# Patient Record
Sex: Male | Born: 1945 | Race: White | Hispanic: No | Marital: Married | Smoking: Former smoker
Health system: Southern US, Community
[De-identification: ages and names within clinical notes are randomized; demographics above are authoritative.]

## PROBLEM LIST (undated history)

## (undated) DIAGNOSIS — K222 Esophageal obstruction: Secondary | ICD-10-CM

## (undated) DIAGNOSIS — I499 Cardiac arrhythmia, unspecified: Secondary | ICD-10-CM

## (undated) DIAGNOSIS — R011 Cardiac murmur, unspecified: Secondary | ICD-10-CM

## (undated) DIAGNOSIS — M159 Polyosteoarthritis, unspecified: Secondary | ICD-10-CM

## (undated) DIAGNOSIS — K760 Fatty (change of) liver, not elsewhere classified: Secondary | ICD-10-CM

## (undated) DIAGNOSIS — J4540 Moderate persistent asthma, uncomplicated: Secondary | ICD-10-CM

## (undated) DIAGNOSIS — T7840XA Allergy, unspecified, initial encounter: Secondary | ICD-10-CM

## (undated) DIAGNOSIS — E785 Hyperlipidemia, unspecified: Secondary | ICD-10-CM

## (undated) DIAGNOSIS — Z1211 Encounter for screening for malignant neoplasm of colon: Secondary | ICD-10-CM

## (undated) DIAGNOSIS — I872 Venous insufficiency (chronic) (peripheral): Secondary | ICD-10-CM

## (undated) DIAGNOSIS — J189 Pneumonia, unspecified organism: Secondary | ICD-10-CM

## (undated) DIAGNOSIS — R0609 Other forms of dyspnea: Secondary | ICD-10-CM

## (undated) DIAGNOSIS — N183 Chronic kidney disease, stage 3 unspecified: Secondary | ICD-10-CM

## (undated) DIAGNOSIS — I2510 Atherosclerotic heart disease of native coronary artery without angina pectoris: Secondary | ICD-10-CM

## (undated) DIAGNOSIS — I10 Essential (primary) hypertension: Secondary | ICD-10-CM

## (undated) DIAGNOSIS — H269 Unspecified cataract: Secondary | ICD-10-CM

## (undated) DIAGNOSIS — Z87442 Personal history of urinary calculi: Secondary | ICD-10-CM

## (undated) DIAGNOSIS — I421 Obstructive hypertrophic cardiomyopathy: Secondary | ICD-10-CM

## (undated) DIAGNOSIS — I5032 Chronic diastolic (congestive) heart failure: Secondary | ICD-10-CM

## (undated) DIAGNOSIS — I480 Paroxysmal atrial fibrillation: Secondary | ICD-10-CM

## (undated) DIAGNOSIS — J449 Chronic obstructive pulmonary disease, unspecified: Secondary | ICD-10-CM

## (undated) DIAGNOSIS — K219 Gastro-esophageal reflux disease without esophagitis: Secondary | ICD-10-CM

## (undated) DIAGNOSIS — Z9989 Dependence on other enabling machines and devices: Secondary | ICD-10-CM

## (undated) DIAGNOSIS — K449 Diaphragmatic hernia without obstruction or gangrene: Secondary | ICD-10-CM

## (undated) DIAGNOSIS — R0600 Dyspnea, unspecified: Secondary | ICD-10-CM

## (undated) DIAGNOSIS — Z86010 Personal history of colonic polyps: Principal | ICD-10-CM

## (undated) DIAGNOSIS — R0989 Other specified symptoms and signs involving the circulatory and respiratory systems: Secondary | ICD-10-CM

## (undated) DIAGNOSIS — G4733 Obstructive sleep apnea (adult) (pediatric): Secondary | ICD-10-CM

## (undated) HISTORY — PX: OTHER SURGICAL HISTORY: SHX169

## (undated) HISTORY — DX: Cardiac murmur, unspecified: R01.1

## (undated) HISTORY — PX: TRANSTHORACIC ECHOCARDIOGRAM: SHX275

## (undated) HISTORY — DX: Esophageal obstruction: K22.2

## (undated) HISTORY — DX: Diaphragmatic hernia without obstruction or gangrene: K44.9

## (undated) HISTORY — PX: TRANSESOPHAGEAL ECHOCARDIOGRAM: SHX273

## (undated) HISTORY — DX: Obstructive hypertrophic cardiomyopathy: I42.1

## (undated) HISTORY — DX: Dependence on other enabling machines and devices: Z99.89

## (undated) HISTORY — DX: Obstructive sleep apnea (adult) (pediatric): G47.33

## (undated) HISTORY — DX: Unspecified cataract: H26.9

## (undated) HISTORY — DX: Paroxysmal atrial fibrillation: I48.0

## (undated) HISTORY — DX: Chronic diastolic (congestive) heart failure: I50.32

## (undated) HISTORY — DX: Allergy, unspecified, initial encounter: T78.40XA

## (undated) HISTORY — DX: Chronic obstructive pulmonary disease, unspecified: J44.9

## (undated) HISTORY — DX: Dyspnea, unspecified: R06.00

## (undated) HISTORY — DX: Other specified symptoms and signs involving the circulatory and respiratory systems: R09.89

## (undated) HISTORY — DX: Morbid (severe) obesity due to excess calories: E66.01

## (undated) HISTORY — DX: Chronic kidney disease, stage 3 unspecified: N18.30

## (undated) HISTORY — DX: Atherosclerotic heart disease of native coronary artery without angina pectoris: I25.10

## (undated) HISTORY — DX: Venous insufficiency (chronic) (peripheral): I87.2

## (undated) HISTORY — DX: Gastro-esophageal reflux disease without esophagitis: K21.9

## (undated) HISTORY — DX: Hyperlipidemia, unspecified: E78.5

## (undated) HISTORY — PX: NASAL SINUS SURGERY: SHX719

## (undated) HISTORY — DX: Encounter for screening for malignant neoplasm of colon: Z12.11

## (undated) HISTORY — PX: CARDIOVASCULAR STRESS TEST: SHX262

## (undated) HISTORY — DX: Other forms of dyspnea: R06.09

## (undated) HISTORY — DX: Fatty (change of) liver, not elsewhere classified: K76.0

## (undated) HISTORY — DX: Polyosteoarthritis, unspecified: M15.9

## (undated) HISTORY — DX: Essential (primary) hypertension: I10

## (undated) HISTORY — PX: COLONOSCOPY: SHX174

## (undated) HISTORY — PX: CARDIOVERSION: SHX1299

## (undated) HISTORY — DX: Personal history of colonic polyps: Z86.010

## (undated) HISTORY — DX: Moderate persistent asthma, uncomplicated: J45.40

---

## 1968-04-29 HISTORY — PX: SPINE SURGERY: SHX786

## 1999-01-25 ENCOUNTER — Ambulatory Visit (HOSPITAL_BASED_OUTPATIENT_CLINIC_OR_DEPARTMENT_OTHER): Admission: RE | Admit: 1999-01-25 | Discharge: 1999-01-25 | Payer: Self-pay | Admitting: Orthopaedic Surgery

## 2001-12-01 ENCOUNTER — Ambulatory Visit (HOSPITAL_BASED_OUTPATIENT_CLINIC_OR_DEPARTMENT_OTHER): Admission: RE | Admit: 2001-12-01 | Discharge: 2001-12-02 | Payer: Self-pay | Admitting: Orthopaedic Surgery

## 2003-04-21 ENCOUNTER — Encounter: Payer: Self-pay | Admitting: Family Medicine

## 2003-06-16 ENCOUNTER — Ambulatory Visit (HOSPITAL_COMMUNITY): Admission: RE | Admit: 2003-06-16 | Discharge: 2003-06-16 | Payer: Self-pay | Admitting: Internal Medicine

## 2003-06-17 ENCOUNTER — Ambulatory Visit (HOSPITAL_BASED_OUTPATIENT_CLINIC_OR_DEPARTMENT_OTHER): Admission: RE | Admit: 2003-06-17 | Discharge: 2003-06-17 | Payer: Self-pay | Admitting: Urology

## 2004-03-14 ENCOUNTER — Ambulatory Visit: Payer: Self-pay | Admitting: Internal Medicine

## 2004-04-20 ENCOUNTER — Ambulatory Visit: Payer: Self-pay | Admitting: Gastroenterology

## 2005-04-17 ENCOUNTER — Encounter: Payer: Self-pay | Admitting: Family Medicine

## 2005-04-17 ENCOUNTER — Emergency Department (HOSPITAL_COMMUNITY): Admission: EM | Admit: 2005-04-17 | Discharge: 2005-04-17 | Payer: Self-pay | Admitting: Emergency Medicine

## 2005-04-23 ENCOUNTER — Ambulatory Visit: Payer: Self-pay | Admitting: Internal Medicine

## 2005-04-24 ENCOUNTER — Ambulatory Visit: Payer: Self-pay

## 2005-04-24 ENCOUNTER — Encounter: Payer: Self-pay | Admitting: Family Medicine

## 2005-04-30 ENCOUNTER — Encounter: Admission: RE | Admit: 2005-04-30 | Discharge: 2005-07-29 | Payer: Self-pay | Admitting: Internal Medicine

## 2006-12-07 IMAGING — CR DG CHEST 1V PORT
1 series · 1 of 1 positions shown · non-contrast
Comparison: None.

CLINICAL DATA: Possible heart attack.  Chest pain and shortness of breath.  
PORTABLE CHEST - 1 VIEW 04/17/2005 AT [DATE]:

[view not recorded]
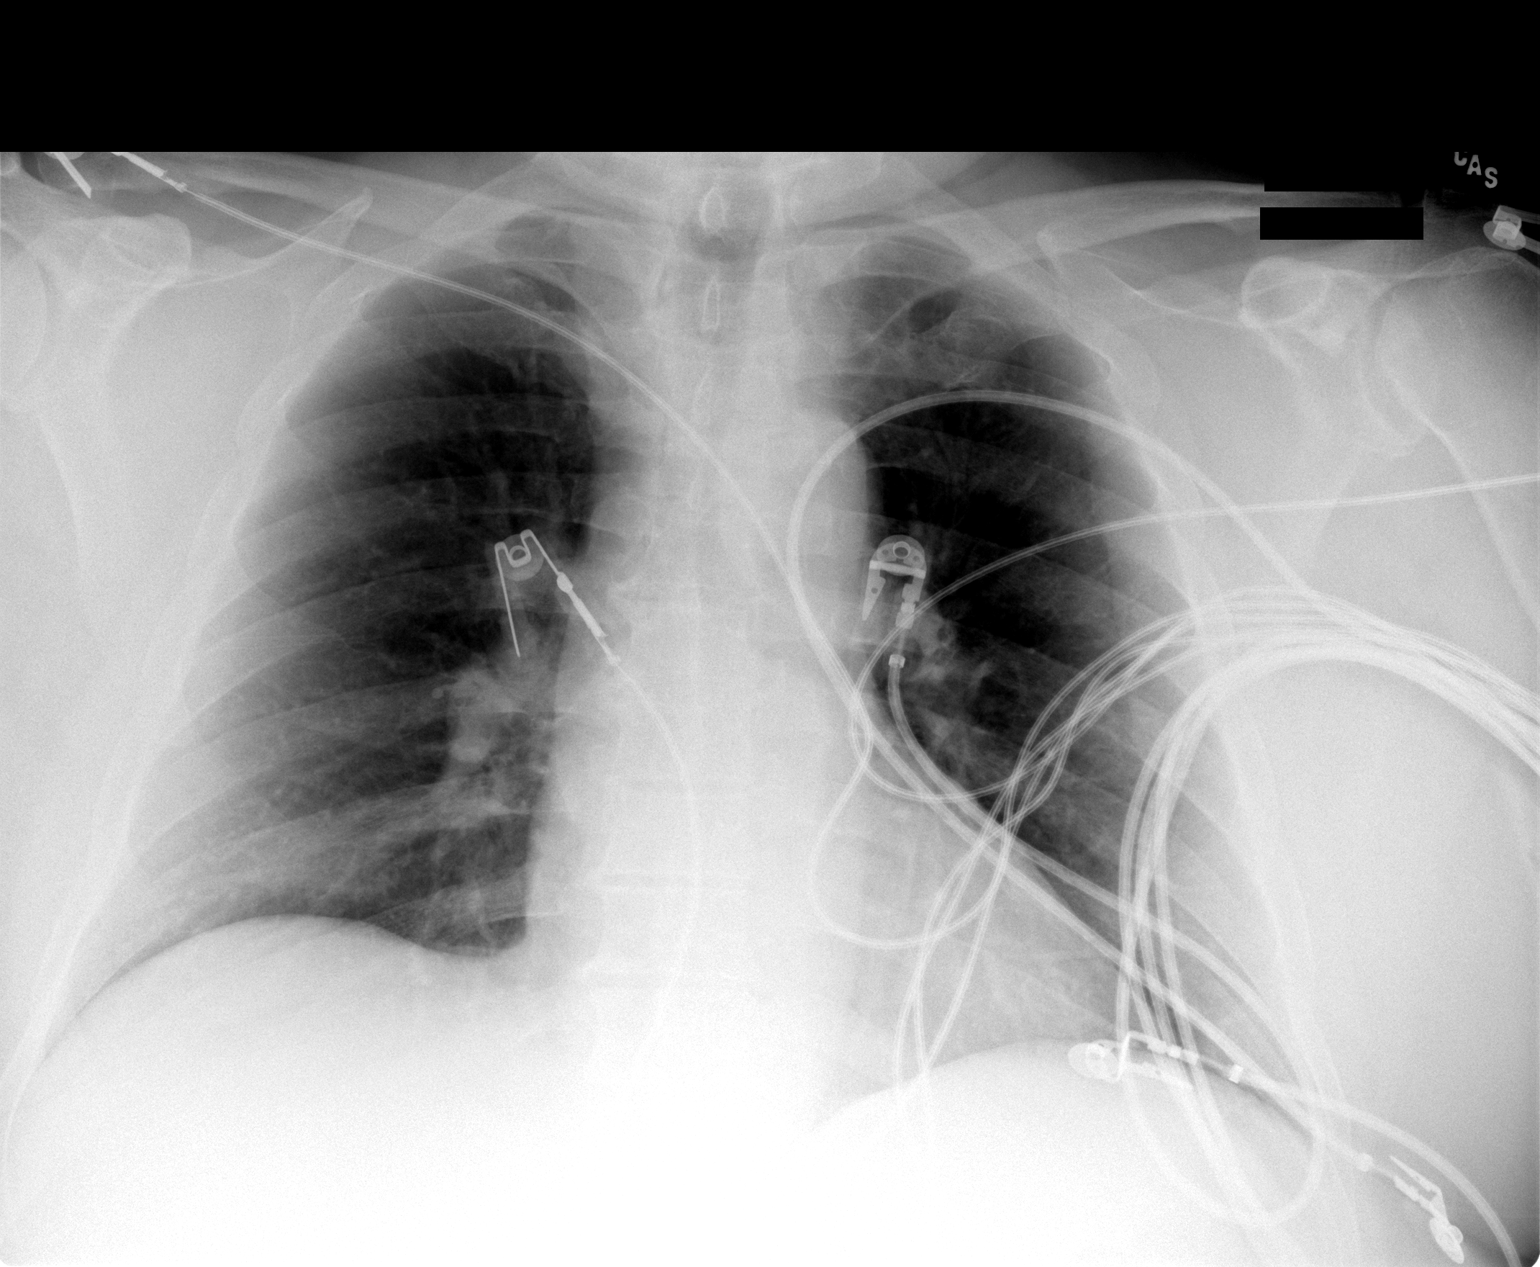

[1 of 1 positions shown; findings below may reference images not displayed]

No evidence of segmental region of consolidation or pulmonary edema.  Patient would benefit from followup PA and lateral chest with leads removed for evaluation of underlying lung parenchyma.  Heart size within normal limits.
IMPRESSION: No acute abnormality.  Followup PA and lateral chest recommended with leads removed when patient is able.

## 2008-02-01 ENCOUNTER — Encounter: Payer: Self-pay | Admitting: Family Medicine

## 2008-02-15 ENCOUNTER — Encounter: Payer: Self-pay | Admitting: Family Medicine

## 2008-03-16 ENCOUNTER — Encounter: Payer: Self-pay | Admitting: Family Medicine

## 2008-06-08 ENCOUNTER — Encounter: Payer: Self-pay | Admitting: Family Medicine

## 2009-03-14 ENCOUNTER — Encounter: Payer: Self-pay | Admitting: Family Medicine

## 2009-12-28 ENCOUNTER — Encounter: Payer: Self-pay | Admitting: Family Medicine

## 2009-12-28 LAB — CONVERTED CEMR LAB: PSA: NORMAL ng/mL

## 2010-01-26 ENCOUNTER — Encounter: Payer: Self-pay | Admitting: Family Medicine

## 2010-04-16 ENCOUNTER — Telehealth: Payer: Self-pay | Admitting: Family Medicine

## 2010-04-17 ENCOUNTER — Encounter: Payer: Self-pay | Admitting: Family Medicine

## 2010-04-27 ENCOUNTER — Encounter: Payer: Self-pay | Admitting: Family Medicine

## 2010-04-29 DIAGNOSIS — I421 Obstructive hypertrophic cardiomyopathy: Secondary | ICD-10-CM | POA: Insufficient documentation

## 2010-04-29 HISTORY — DX: Obstructive hypertrophic cardiomyopathy: I42.1

## 2010-05-03 ENCOUNTER — Encounter: Payer: Self-pay | Admitting: Family Medicine

## 2010-05-11 ENCOUNTER — Encounter: Payer: Self-pay | Admitting: Family Medicine

## 2010-05-14 ENCOUNTER — Ambulatory Visit
Admission: RE | Admit: 2010-05-14 | Discharge: 2010-05-14 | Payer: Self-pay | Source: Home / Self Care | Attending: Family Medicine | Admitting: Family Medicine

## 2010-05-14 ENCOUNTER — Telehealth: Payer: Self-pay | Admitting: Family Medicine

## 2010-05-14 DIAGNOSIS — I10 Essential (primary) hypertension: Secondary | ICD-10-CM | POA: Insufficient documentation

## 2010-05-14 DIAGNOSIS — G4733 Obstructive sleep apnea (adult) (pediatric): Secondary | ICD-10-CM | POA: Insufficient documentation

## 2010-05-14 DIAGNOSIS — R011 Cardiac murmur, unspecified: Secondary | ICD-10-CM | POA: Insufficient documentation

## 2010-05-14 DIAGNOSIS — E119 Type 2 diabetes mellitus without complications: Secondary | ICD-10-CM | POA: Insufficient documentation

## 2010-05-14 DIAGNOSIS — E785 Hyperlipidemia, unspecified: Secondary | ICD-10-CM | POA: Insufficient documentation

## 2010-05-14 DIAGNOSIS — M109 Gout, unspecified: Secondary | ICD-10-CM | POA: Insufficient documentation

## 2010-05-15 ENCOUNTER — Encounter: Payer: Self-pay | Admitting: Family Medicine

## 2010-05-17 ENCOUNTER — Encounter: Payer: Self-pay | Admitting: Family Medicine

## 2010-05-28 DIAGNOSIS — R0989 Other specified symptoms and signs involving the circulatory and respiratory systems: Secondary | ICD-10-CM

## 2010-05-28 HISTORY — DX: Other specified symptoms and signs involving the circulatory and respiratory systems: R09.89

## 2010-05-30 HISTORY — PX: TEE WITHOUT CARDIOVERSION: SHX5443

## 2010-05-31 NOTE — Letter (Signed)
Summary: Problem list  Problem list   Imported By: Lester [place] 05/07/2010 10:07:44  _____________________________________________________________________  External Attachment:    Type:   Image     Comment:   External Document

## 2010-05-31 NOTE — Progress Notes (Signed)
Summary: Medical Record Request  Phone Note Outgoing Call Call back at [phone]   Call placed by: Lannette Donath,  April 16, 2010 9:17 AM Summary of Call: Faxed medical record request to [facility] Initial call taken by: Lannette Donath,  April 16, 2010 9:17 AM

## 2010-05-31 NOTE — Miscellaneous (Signed)
  Clinical Lists Changes  Observations: Added new observation of FAMILY HX: No DM, HTN, or CAD. Father: prostate cancer (05/15/2010 16:41) Added new observation of PAST MED HX: DM 2, non insulin-requiring. HTN Hyperlipidemia OSA Morbid obesity Gout GERD (EGD 04/03/2001,+ distal esophageal stricture dilation) Colonoscopy (screening) 04/03/2001 normal--Dr. Jarold Motto w/ [facility] (05/15/2010 16:41)       Past History:  Past Medical History: DM 2, non insulin-requiring. HTN Hyperlipidemia OSA Morbid obesity Gout GERD (EGD 04/03/2001,+ distal esophageal stricture dilation) Colonoscopy (screening) 04/03/2001 normal--Dr. Jarold Motto w/ [facility]   Family History: No DM, HTN, or CAD. Father: prostate cancer   Allergies: No Known Drug Allergies

## 2010-05-31 NOTE — Assessment & Plan Note (Signed)
Summary: cpx/vfw   Vital Signs:  Patient profile:   65 year old male Height:      68.5 inches (173.99 cm) Weight:      267 pounds (121.36 kg) BMI:     40.15 O2 Sat:      95 % on Room air Temp:     97.8 degrees F (36.56 degrees C) oral Pulse rate:   70 / minute BP sitting:   138 / 75  (right arm) Cuff size:   large  Vitals Entered By: Josph Macho RMA (May 14, 2010 8:17 AM)  O2 Flow:  Room air CC: Establish new patient/ CF   History of Present Illness: 65 y/o WM here to establish care--saw Dr. Yehuda Budd most recently but Dr. Alwyn Ren with [facility] for many years prior to that.  GI/colon cancer screenings: Dr. Jarold Motto with [facility].  Recent cardiovascular eval with Dr. Jacinto Halim (has heart mumur and abnl EKG):: perfusion stress test neg, EF nl.  Echo pending. Recent sleep study with Dr. Suszanne Conners confirmed OSA and he is to get set up with CPAP this week or next. No new complaints. Checks glucose infrequently, just "when I feel off, usually", avg 210 at these times, best 160--in the last month.  HbA1c in records 12/2009 was 7.4%.  From interview today I get the impression that he has a fairly low level of diabetes education. He admits to being even less physically active and having worse diet than normal through the fall and winter so far.  Reviewed med list with pt today: takes all daily except temazepam, which he takes once every few months on avg.  Preventive Screening-Counseling & Management  Alcohol-Tobacco     Alcohol drinks/day: 0     Smoking Status: quit  Current Medications (verified): 1)  Allopurinol 300 Mg Tabs (Allopurinol) .... Once Daily For Gout 2)  Nexium 40 Mg Cpdr (Esomeprazole Magnesium) .... Every Other Day 3)  Metformin Hcl 500 Mg Tabs (Metformin Hcl) .Marland Kitchen.. 1 Two Times A Day 4)  Carvedilol 6.25 Mg Tabs (Carvedilol) .Marland Kitchen.. 1 Two Times A Day 5)  Pravachol 40 Mg Tabs (Pravastatin Sodium) .... Once Daily 6)  Lisinopril 20 Mg Tabs (Lisinopril) .... Once Daily 7)   Aspirin 325 Mg Tabs (Aspirin) .... Once Daily 8)  Temazepam 15 Mg Caps (Temazepam) .... At Bedtime 9)  Stool Softner .... Once Daily  Allergies (verified): No Known Drug Allergies  Past History:  Past Medical History: DM 2, non insulin-requiring. HTN Hyperlipidemia OSA Morbid obesity Gout GERD Colonoscopy x 2 (Dr. Jarold Motto w/  GI)  Past Surgical History: NONE  Family History: No DM, HTN, or CAD.  Social History: Married, 1 son, 1 granddaughter. Retired from [employer] 2000. Hobbies: trading farm equipment. Tobacco x many years, quit @ 2005 No ETOH or drugs. No regular exercise.Smoking Status:  quit  Review of Systems  The patient denies anorexia, fever, weight loss, weight gain, vision loss, decreased hearing, hoarseness, chest pain, syncope, dyspnea on exertion, peripheral edema, prolonged cough, headaches, hemoptysis, abdominal pain, melena, hematochezia, severe indigestion/heartburn, hematuria, incontinence, genital sores, muscle weakness, suspicious skin lesions, transient blindness, difficulty walking, depression, unusual weight change, abnormal bleeding, enlarged lymph nodes, angioedema, breast masses, and testicular masses.    Physical Exam  General:  VS: noted, all normal. Gen: Alert, well appearing, oriented x 4.  Obese-appearing.   Pleasant. HEENT: Eyes: no injection, icteris, swelling, or exudate.  EOMI, PERRLA. Nose: no drainage or turbinate edema/swelling.  Oral mucosa pink and moist.  Dentition  intact and without obvious caries or gingival swelling.  Oropharynx without erythema, exudate, or swelling.  Neck: supple.  No lymphadenopathy, thyromegaly, or mass. Chest: symmetric expansion, with nonlabored respirations.  Clear and equal breath sounds in all lung fields.   CV: RRR, 3/6 holosystolic murmur, S1 and S2 obscured.  No /r/g.   EXT: no clubbing, cyanosis, or edema.      Impression & Recommendations:  Problem # 1:  DIABETES  MELLITUS, TYPE II, CONTROLLED, W/O COMPLICATIONS (ICD-250.00) Assessment Deteriorated Reviewed labs from 12/2009 at [facility] clinic: A1c 7.4%, urine microalb neg, PSA and TSH normal, AST and ALT about 2X nl, and LDL good but HDL and trigl not ideal.  Some education was done today regarding home glucose monitoring, HbA1c, natural progression of dz, etc. He says he is going to start exercising and dieting ASAP, so we'll see how his glucoses improve over the next 64mo and continue metformin only for now.  At 2 mo f/u, will check HbA1c and CMET.  I asked him to check a fasting CBG 3 times per week and bring glucometer with him to office f/u.  Will inquire about last eye exam at f/u visit. 45 minutes spent with pt in exam room today, with >50% of this time spent in doing couseling/coordination of care for DM 2 and obesity/wt loss.  Problem # 2:  SYSTOLIC MURMUR (ZOX-096.2) Assessment: Unchanged Will look for ECHO report--ordered by Dr. Jacinto Halim, cardiologist, who feels like the murmur is consistent with a combo of aortic stenosis/sclerosis and mitral insufficiency. Recent ischemia w/u was reassuring.  Continue all current meds.  Problem # 3:  OBESITY, MORBID (ICD-278.01) Assessment: Unchanged Encouraged pt to start exercise/diet as he says he is. F/u 64mo.  He declined nutritionist referral today.  Problem # 4:  OBSTRUCTIVE SLEEP APNEA (ICD-327.23) Assessment: Unchanged Encouraged pt to give CPAP a try.  Will obtain records from Dr. Suszanne Conners.  Complete Medication List: 1)  Allopurinol 300 Mg Tabs (Allopurinol) .... Once daily for gout 2)  Nexium 40 Mg Cpdr (Esomeprazole magnesium) .... Every other day 3)  Metformin Hcl 500 Mg Tabs (Metformin hcl) .Marland Kitchen.. 1 two times a day 4)  Carvedilol 6.25 Mg Tabs (Carvedilol) .Marland Kitchen.. 1 two times a day 5)  Pravachol 40 Mg Tabs (Pravastatin sodium) .... Once daily 6)  Lisinopril 20 Mg Tabs (Lisinopril) .... Once daily 7)  Aspirin 325 Mg Tabs (Aspirin) .... Once daily 8)   Temazepam 15 Mg Caps (Temazepam) .... At bedtime 9)  Stool Softner  .... Once daily  Patient Instructions: 1)  F/u 2 months, needs labs one week prior (CMET, HbA1c). 2)   Bring your glucometer next visit. 3)  Check glucose FASTING A.M 3 days per week until f/u.   Orders Added: 1)  New Patient Level IV [04540]   Immunization History:  Influenza Immunization History:    Influenza:  historical (12/28/2009)   Immunization History:  Influenza Immunization History:    Influenza:  Historical (12/28/2009)  Preventive Care Screening  Last Tetanus Booster:    Date:  04/30/2007    Results:  Historical   Colonoscopy:    Date:  04/29/2006    Results:  historical

## 2010-05-31 NOTE — Miscellaneous (Signed)
  Clinical Lists Changes  Observations: Added new observation of PSA: normal (12/28/2009 16:52) Added new observation of COLONOSCOPY: normal (04/03/2001 16:52)        Preventive Care Screening  PSA:    Date:  12/28/2009    Results:  normal  Colonoscopy:    Date:  04/03/2001    Results:  normal

## 2010-05-31 NOTE — Letter (Signed)
Summary: [facility]  [facility]   Imported By: Lester [place] 04/24/2010 08:06:14  _____________________________________________________________________  External Attachment:    Type:   Image     Comment:   External Document

## 2010-05-31 NOTE — Miscellaneous (Signed)
  Clinical Lists Changes  Observations: Added new observation of PAST MED HX: DM 2, non insulin-requiring. HTN Hyperlipidemia OSA (AHI 24 on PSG 04/2010; on CPAP as per Dr. Suszanne Conners) Morbid obesity Gout GERD (EGD 04/03/2001,+ distal esophageal stricture dilation) Colonoscopy (screening) 04/03/2001 normal--Dr. Jarold Motto w/ [facility] (05/17/2010 13:07)       Past History:  Past Medical History: DM 2, non insulin-requiring. HTN Hyperlipidemia OSA (AHI 24 on PSG 04/2010; on CPAP as per Dr. Suszanne Conners) Morbid obesity Gout GERD (EGD 04/03/2001,+ distal esophageal stricture dilation) Colonoscopy (screening) 04/03/2001 normal--Dr. Jarold Motto w/ [facility]  Appended Document:  Received patient medical records from [facility] 1.19.2011.

## 2010-05-31 NOTE — Letter (Signed)
Summary: [facility]  [facility]   Imported By: Lester [place] 05/09/2010 08:28:32  _____________________________________________________________________  External Attachment:    Type:   Image     Comment:   External Document

## 2010-05-31 NOTE — Progress Notes (Signed)
  Phone Note Other Incoming   Summary of Call: Need records from Dr. Suszanne Conners (ENT).  He also says he has been a long-time pt of Dr. Jarold Motto in [facility], but I don't see any records in EMR from him.  Could these be tracked down? Initial call taken by: Michell Heinrich [name],  May 14, 2010 11:49 AM  Follow-up for Phone Call        Dr Avel Sensor office [phone] will fax OV when they have been read by physician Diane Tomerlin  May 14, 2010 1:36 PM

## 2010-06-02 ENCOUNTER — Encounter: Payer: Self-pay | Admitting: Family Medicine

## 2010-06-05 ENCOUNTER — Encounter: Payer: Self-pay | Admitting: Family Medicine

## 2010-06-06 ENCOUNTER — Ambulatory Visit (HOSPITAL_COMMUNITY)
Admission: RE | Admit: 2010-06-06 | Discharge: 2010-06-06 | Disposition: A | Discharge status: Home / Self Care | Payer: 59 | Source: Ambulatory Visit | Attending: Cardiology | Admitting: Cardiology

## 2010-06-06 DIAGNOSIS — I421 Obstructive hypertrophic cardiomyopathy: Secondary | ICD-10-CM | POA: Insufficient documentation

## 2010-06-06 LAB — GLUCOSE, CAPILLARY: Glucose-Capillary: 98 mg/dL (ref 70–99)

## 2010-06-14 NOTE — Letter (Signed)
Summary: External Correspondence  External Correspondence   Imported By: Kassie Mends 06/07/2010 09:18:54  _____________________________________________________________________  External Attachment:    Type:   Image     Comment:   External Document

## 2010-06-14 NOTE — Miscellaneous (Signed)
  Clinical Lists Changes  Observations: Added new observation of GASTROENT MD: Dr. Jarold Motto ([facility]) (06/05/2010 13:53) Added new observation of ENT MD: Dr. Suszanne Conners (06/05/2010 13:53) Added new observation of CARDIO MD: Dr. Jacinto Halim [facility]) (06/05/2010 13:53) Added new observation of PAST MED HX: DM 2, non insulin-requiring. HTN Abnl EKG and systolic murmur: neg stress test 03/2010, ECHO 05/28/10 showed EF nl, and LVOT obst w/lateral wall hypokinesis---Dr. Jacinto Halim to do TEE to clarify (possible HOCM) Hyperlipidemia OSA (AHI 24 on PSG 04/2010; on CPAP as per Dr. Suszanne Conners but pt intolerant of this) Morbid obesity Gout GERD (EGD 04/03/2001,+ distal esophageal stricture dilation) Colonoscopy (screening) 04/03/2001 normal--Dr. Jarold Motto w/ [facility] (06/05/2010 13:53)       Past History:  Past Medical History: DM 2, non insulin-requiring. HTN Abnl EKG and systolic murmur: neg stress test 03/2010, ECHO 05/28/10 showed EF nl, and LVOT obst w/lateral wall hypokinesis---Dr. Jacinto Halim to do TEE to clarify (possible HOCM) Hyperlipidemia OSA (AHI 24 on PSG 04/2010; on CPAP as per Dr. Suszanne Conners but pt intolerant of this) Morbid obesity Gout GERD (EGD 04/03/2001,+ distal esophageal stricture dilation) Colonoscopy (screening) 04/03/2001 normal--Dr. Jarold Motto w/ [facility]  Care Coordination Cardiologist: Dr. Jacinto Halim [facility]) ENT: Dr. Suszanne Conners Gastroenterologist: Dr. Jarold Motto ([facility])

## 2010-07-05 NOTE — Letter (Signed)
Summary: [facility]  [facility]   Imported By: Lester [place] 06/29/2010 07:32:26  _____________________________________________________________________  External Attachment:    Type:   Image     Comment:   External Document

## 2010-07-06 ENCOUNTER — Encounter: Payer: Self-pay | Admitting: Family Medicine

## 2010-07-16 ENCOUNTER — Encounter: Payer: Self-pay | Admitting: Family Medicine

## 2010-07-16 ENCOUNTER — Ambulatory Visit (INDEPENDENT_AMBULATORY_CARE_PROVIDER_SITE_OTHER): Payer: 59 | Admitting: Family Medicine

## 2010-07-16 DIAGNOSIS — G4733 Obstructive sleep apnea (adult) (pediatric): Secondary | ICD-10-CM

## 2010-07-16 DIAGNOSIS — R011 Cardiac murmur, unspecified: Secondary | ICD-10-CM

## 2010-07-16 DIAGNOSIS — E119 Type 2 diabetes mellitus without complications: Secondary | ICD-10-CM

## 2010-07-16 DIAGNOSIS — E785 Hyperlipidemia, unspecified: Secondary | ICD-10-CM

## 2010-07-16 DIAGNOSIS — I10 Essential (primary) hypertension: Secondary | ICD-10-CM

## 2010-07-20 ENCOUNTER — Telehealth: Payer: Self-pay | Admitting: Family Medicine

## 2010-07-20 ENCOUNTER — Other Ambulatory Visit (INDEPENDENT_AMBULATORY_CARE_PROVIDER_SITE_OTHER): Payer: 59 | Admitting: Family Medicine

## 2010-07-20 DIAGNOSIS — R7309 Other abnormal glucose: Secondary | ICD-10-CM

## 2010-07-20 DIAGNOSIS — R739 Hyperglycemia, unspecified: Secondary | ICD-10-CM

## 2010-07-20 LAB — COMPREHENSIVE METABOLIC PANEL
AST: 45 U/L — ABNORMAL HIGH (ref 0–37)
Albumin: 3.9 g/dL (ref 3.5–5.2)
Alkaline Phosphatase: 71 U/L (ref 39–117)
BUN: 15 mg/dL (ref 6–23)
Calcium: 9.1 mg/dL (ref 8.4–10.5)
Chloride: 109 mEq/L (ref 96–112)
Creatinine, Ser: 1.1 mg/dL (ref 0.4–1.5)
Glucose, Bld: 103 mg/dL — ABNORMAL HIGH (ref 70–99)
Sodium: 142 mEq/L (ref 135–145)
Total Bilirubin: 0.8 mg/dL (ref 0.3–1.2)
Total Protein: 6.6 g/dL (ref 6.0–8.3)

## 2010-07-20 LAB — HEMOGLOBIN A1C: Hgb A1c MFr Bld: 6.9 % — ABNORMAL HIGH (ref 4.6–6.5)

## 2010-07-20 NOTE — Telephone Encounter (Signed)
Pls notify: his HbA1c is 6.9%--great. His liver tests were just slightly up.  I just want to repeat these at his next f/u visit.

## 2010-07-23 ENCOUNTER — Telehealth: Payer: Self-pay | Admitting: *Deleted

## 2010-07-23 NOTE — Telephone Encounter (Signed)
Pt's wife, Chyrl Civatte notified of lab results per DPR.  Pt should make a follow up appt in 4 months per Dr.McGowen.  They are agreeable.

## 2010-07-23 NOTE — Telephone Encounter (Signed)
Document created in error

## 2010-07-26 NOTE — Assessment & Plan Note (Signed)
Summary: 2 month follow up/ vfw   Vital Signs:  Patient profile:   65 year old male Height:      68.5 inches Weight:      286.25 pounds BMI:     43.05 Pulse rate:   74 / minute BP sitting:   105 / 65  (right arm) Cuff size:   large  Vitals Entered By: Francee Piccolo CMA Duncan Dull) (July 16, 2010 8:46 AM)  History of Present Illness:  Type 2 diabetes mellitus follow-up      This is a 65 year old man who presents with Type 2 diabetes mellitus  2 month follow-up.  The patient denies polyuria, polydipsia, blurred vision, self managed hypoglycemia, hypoglycemia requiring help, weight loss, weight gain, and numbness of extremities.  Other symptoms include brief orthostatic symptoms 3-4 times per month.  The patient denies the following symptoms: neuropathic pain, chest pain, vomiting, poor wound healing, intermittent claudication, vision loss, and foot ulcer.   He monitors his glucose rarely, and review of his glucometer shows one week's worth of checks (fasting and one 2H pp per day) in January, then only a single glucose check in february, and only one this month (this morning).  No specific reason for poor monitoring, just says "I'm slack with that".  Of the recorded numbers, fastings seem okay, avg low 100s.  The 2h pps are in the low 200s. He says he sees Dr. Nile Riggs for D.R. screens annually, and the last one was about a year ago. Says Dr. Jacinto Halim did the TEE that he planned and pt reports all was normal.  No report available for review. He says he couldn't tolerate CPAP, even with mask change.  Says he doesn't want the surgery Dr. Suszanne Conners offered.  Current Medications (verified): 1)  Allopurinol 300 Mg Tabs (Allopurinol) .... Once Daily For Gout 2)  Nexium 40 Mg Cpdr (Esomeprazole Magnesium) .... Every Other Day 3)  Metformin Hcl 500 Mg Tabs (Metformin Hcl) .Marland Kitchen.. 1 Two Times A Day 4)  Carvedilol 6.25 Mg Tabs (Carvedilol) .Marland Kitchen.. 1 Two Times A Day 5)  Pravachol 40 Mg Tabs (Pravastatin Sodium)  .... Once Daily 6)  Lisinopril 20 Mg Tabs (Lisinopril) .... Once Daily 7)  Aspirin 325 Mg Tabs (Aspirin) .... Once Daily 8)  Temazepam 15 Mg Caps (Temazepam) .... At Bedtime As Needed 9)  Stool Softner .... Once Daily As Needed  Allergies (verified): No Known Drug Allergies  Past History:  Past Medical History: Reviewed history from 06/05/2010 and no changes required. DM 2, non insulin-requiring. HTN Abnl EKG and systolic murmur: neg stress test 03/2010, ECHO 05/28/10 showed EF nl, and LVOT obst w/lateral wall hypokinesis---Dr. Jacinto Halim to do TEE to clarify (possible HOCM) Hyperlipidemia OSA (AHI 24 on PSG 04/2010; on CPAP as per Dr. Suszanne Conners but pt intolerant of this) Morbid obesity Gout GERD (EGD 04/03/2001,+ distal esophageal stricture dilation) Colonoscopy (screening) 04/03/2001 normal--Dr. Jarold Motto w/ [facility]  Past Surgical History: Reviewed history from 05/14/2010 and no changes required. NONE  Review of Systems       see HPI  Physical Exam  General:  VS: noted, all normal. Gen: Alert, well appearing, oriented x 4. CV: RRR, 3/6 systolic murmur with an obscured S1 and fairly clear S2.  Slight harsh sound to the murmur.  No diastolic murmur.  No rub, no gallop. Lungs: CTA bilat, nonlabored.  EXT: no c/c/e   Impression & Recommendations:  Problem # 1:  DIABETES MELLITUS, TYPE II, CONTROLLED, W/O COMPLICATIONS (ICD-250.00) Assessment Unchanged I'm  trying to talk him into being more compliant with monitoring glucoses.  Discussed rationale behind home monitoring. He's about due for D.R. check and says Dr. Ashley Royalty office sends him a reminder. We'll check HbA1c and CMET next friday here at our lab.  His updated medication list for this problem includes:    Metformin Hcl 500 Mg Tabs (Metformin hcl) .Marland Kitchen... 1 two times a day    Lisinopril 20 Mg Tabs (Lisinopril) ..... Once daily    Aspirin 325 Mg Tabs (Aspirin) ..... Once daily  Problem # 2:  SYSTOLIC MURMUR  (ICD-785.2) Assessment: Unchanged Per pt report, his TEE was "fine".   I anticipate getting Dr. Verl Dicker report of this procedure in the next week or two.  If not, will request it.  Problem # 3:  HYPERLIPIDEMIA (ICD-272.4) Assessment: Unchanged 12/2009 labs were fine.  Will repeat 12/2010. Continue low chol/low fat diet.  His updated medication list for this problem includes:    Pravachol 40 Mg Tabs (Pravastatin sodium) ..... Once daily  Problem # 4:  OBESITY, MORBID (ICD-278.01) Assessment: Unchanged Per pt, he is at his baseline weight today.  He says his weight at our initial check 2 months ago was a scale inaccuracy. Continue low fat/low chol diet, encouraged him to get more active.  Problem # 5:  ESSENTIAL HYPERTENSION (ICD-401.9) Assessment: Unchanged Stable.  Cont. current meds.  His updated medication list for this problem includes:    Carvedilol 6.25 Mg Tabs (Carvedilol) .Marland Kitchen... 1 two times a day    Lisinopril 20 Mg Tabs (Lisinopril) ..... Once daily  Problem # 6:  OBSTRUCTIVE SLEEP APNEA (ICD-327.23) Assessment: Unchanged Intolerant of CPAP.   Declines surgical option.  Complete Medication List: 1)  Allopurinol 300 Mg Tabs (Allopurinol) .... Once daily for gout 2)  Nexium 40 Mg Cpdr (Esomeprazole magnesium) .... Every other day 3)  Metformin Hcl 500 Mg Tabs (Metformin hcl) .Marland Kitchen.. 1 two times a day 4)  Carvedilol 6.25 Mg Tabs (Carvedilol) .Marland Kitchen.. 1 two times a day 5)  Pravachol 40 Mg Tabs (Pravastatin sodium) .... Once daily 6)  Lisinopril 20 Mg Tabs (Lisinopril) .... Once daily 7)  Aspirin 325 Mg Tabs (Aspirin) .... Once daily 8)  Temazepam 15 Mg Caps (Temazepam) .... At bedtime as needed 9)  Stool Softner  .... Once daily as needed  Patient Instructions: 1)  Make lab appt for next friday (CMET and HbA1c) --you do not need to be fasting. 2)  Check glucose every morning before BF and then again 2 hours after any meal.   Orders Added: 1)  Est. Patient Level III [04540]

## 2010-09-14 NOTE — Consult Note (Signed)
NAME:  Jack [name], NUNO                          ACCOUNT NO.:  [id number]   MEDICAL RECORD NO.:  [id number]                   PATIENT TYPE:  OUT   LOCATION:  XRAY                                 FACILITY:  [facility]   PHYSICIAN:  [name], [name]               DATE OF BIRTH:  11-Sep-1945   DATE OF CONSULTATION:  06/16/2003  DATE OF DISCHARGE:                                   CONSULTATION   REFERRING PHYSICIAN:  Dr. Titus Dubin. Hopper.   REASON FOR CONSULTATION:  The patient is a 65 year old white male who I was  asked to see by Dr. Alwyn Ren for further evaluation of a newly diagnosed  kidney stone.  The patient reports that he has never had a stone before, but  last night about midnight, he began to have a little pain in his left flank.  He could not seem to get into a comfortable position; he ended up falling  asleep in a chair on a heating pad.  In the morning, he was having flank and  abdominal pain, no testicular discomfort; he went in to see Dr. Alwyn Ren;  microscopic hematuria was detected but no gross hematuria has been seen.  There is no family history of kidney stones or renal disease.  He has never  had a prior stone.  His pain is fairly mild at this time.   PAST MEDICAL HISTORY:  Past medical history is significant for:  1. Gout.  2. Gastroesophageal reflux disease.   SURGICAL HISTORY:  1. Back surgery.  2. Knee surgery.  3. Shoulder surgery.   MEDICATIONS:  Nexium and allopurinol.   ALLERGIES:  No known drug allergies.   SOCIAL HISTORY:  He used to smoke a pack a day for 30 years but quit 1 month  ago.   FAMILY HISTORY:  There is no family history of stones but there is a  positive family history of prostate cancer in his father.   PHYSICAL EXAMINATION:  GENERAL:  Generally, the patient is a well-developed,  well-nourished white male in no apparent distress.  HEENT:  Atraumatic, normocephalic.  Oropharynx is clear.  NECK:  Neck is supple with midline trachea.  LUNGS:  He has normal respiratory effort.  CARDIOVASCULAR:  Regular rate and rhythm.  ABDOMEN:  Abdomen is obese, soft and nontender without mass.  There is mild  CVAT to percussion.  GU:  He has normal external genitalia.  EXTREMITIES:  Extremities without clubbing, cyanosis, or edema.  SKIN:  His skin is warm and dry.  NEUROLOGIC:  He is alert and oriented with appropriate mood and affect.   LABORATORY AND ACCESSORY CLINICAL DATA:  Review of his CT scan done today  reveals a 3-mm left mid-ureteral calculus with mild perinephric stranding  and mild caliceal dilatation.  No renal calculi are identified.  The pelvic  anatomy appears unremarkable.   IMPRESSION:  Left mid-ureteral calculus causing intermittent pain.  I  discussed with the patient the fact that the stone would likely not be  visible for lithotripsy, however, he would be a good candidate for  ureteroscopic extraction.  We also discussed observation with pain  medication.  He wants to consider surgical intervention at this time, if he  has any further pain, but would like to see if he could pass the stone.   PLAN:  1. Mepergan Fortis, #38.  2. Toradol 10 mg tabs, #20.  3. Flomax 0.4 mg daily to decrease ureteral tone and aid passage of the     stone.  4. I told him I would contact him in the morning and we will check to see if     there is available OR time prior to calling to see if he wants to proceed     with ureteroscopic extraction, otherwise, I told him he has approximately     a 70% to 80% chance of passing the stone spontaneously.  5. He is going to force fluids and watch for the stone to pass.  6. If he decides not to undergo ureteroscopic extraction, I will see him     back in approximately 1 week for followup.                                               [name], [name]    MCO/MEDQ  D:  06/16/2003  T:  06/18/2003  Job:  [id number]   cc:   Titus Dubin. Alwyn Ren, [name] [facility]

## 2010-09-14 NOTE — Op Note (Signed)
NAME:  Jack [name], Jack [name]                            ACCOUNT NO.:  [id number]   MEDICAL RECORD NO.:  [id number]                   PATIENT TYPE:   LOCATION:                                       FACILITY:   PHYSICIAN:  Lubertha Basque. Jerl Santos, [name]             DATE OF BIRTH:   DATE OF PROCEDURE:  12/01/2001  DATE OF DISCHARGE:                                 OPERATIVE REPORT   PREOPERATIVE DIAGNOSES:  1. Left shoulder rotator cuff tear.  2. Left shoulder impingement.  3. Left shoulder acromioclavicular pain.   POSTOPERATIVE DIAGNOSES:  1. Left shoulder rotator cuff tear.  2. Left shoulder impingement.  3. Left shoulder acromioclavicular pain.   PROCEDURES:  1. Left shoulder open rotator cuff repair.  2. Left shoulder arthroscopic acromioplasty.  3. Left shoulder arthroscopic acromioclavicular resection.  4. Left shoulder arthroscopic debridement of biceps tendon.   ANESTHESIA:  General en bloc.   ATTENDING SURGEON:  Lubertha Basque. Jerl Santos, [name]   ASSISTANT:  Prince Rome, P.A.   INDICATIONS FOR PROCEDURE:  The patient is a 65 year old man with a long  history of left shoulder pain.  It has persisted for about five months.  He  responded for about a week to a subacromial injection.  He had undergone a  preoperative MRI scan which showed a complete rotator cuff tear.  He also  has pain at the [facility] joint.  He is offered an operation on the shoulder at  this point.  Informed operative consent was obtained after a discussion of  the possible complications or, reaction to anesthesia, infection, and  stiffness.   DESCRIPTION OF PROCEDURE:  The patient was taken to the operating suite  where general anesthesia was applied without difficulty.  He was also given  a block in the anesthesia area.  He was positioned in the beach chair  position and prepped and draped in the normal sterile fashion.  After  administration of preoperative IV antibiotics an arthroscopy of the left  shoulder was  performed through a total of three portals.  The glenohumeral  joint showed no degenerative change.  The biceps tendon appeared intact  though he did have about a 10% tear as the biceps left the shoulder and this  was addressed with a debridement back to stable tissue.  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  This was left intact.  The rest of the labrum  appeared normal.  The subscapularis was normal anterior to this ligament.  From below he appeared to have a very large rotator cuff tear.  This was  confirmed in the subacromial space.  This tear was several centimeters in  width and was retracted.  It was felt best to repair this in an open  fashion.  Before this was done he underwent an arthroscopic subacromial  decompression with  the bur in the lateral position followed by transfer of  the bur to the posterior position.  He also performed an arthroscopic AC  resection through an additional anterior portal creating about a 1-cm space  between the clavicle and acromion which initially were touching.   An incision was made anterolateral with dissection down between fibers of  the deltoid which was not detached from the acromion.  This exposed the  rotator cuff tear well.  He had fairly good tissue and it was felt okay to  repair this.  I made a bleeding bed of bone with a rongeur and arthroscopic  bur.  I then placed two of the Arthrex absorbable anchors with two sutures  emanating from each.  These FiberWire sutures were then passed through the  rotator cuff in a mattress fashion to approximate the rotator cuff down to  the bleeding bed of bone.  Once this was accomplished, the cuff appeared to  be well repaired.  I did medialize several millimeters to take some of the  tension off of the repair.   The wound was thoroughly irrigated followed by reapproximation of the  deltoid fascia with 0 Vicryl and  subcutaneous sutures with 2-0 undyed  Vicryl.  The skin was closed with nylon followed by Adaptic and a dry gauze  dressing with tape.  The portals were also loosely reapproximated with  simple sutures of nylon. Dry gauze dressings were applied to these areas as  well.   ESTIMATED BLOOD LOSS AND INTRAOPERATIVE FLUIDS:  Obtain from anesthesia  records.   DISPOSITION:  The patient was extubated in the operating room and taken to  the recovery room in stable condition.   Plans were for him to go home the same day and followup in the office in one  week.  I will contact him by phone tonight.                                               Lubertha Basque Jerl Santos, [name]    PGD/MEDQ  D:  12/01/2001  T:  12/04/2001  Job:  [id number]

## 2010-09-14 NOTE — Op Note (Signed)
NAME:  Jack [name], Jack [name]                          ACCOUNT NO.:  [id number]   MEDICAL RECORD NO.:  [id number]                   PATIENT TYPE:  AMB   LOCATION:  [facility]                                 FACILITY:  [facility]   PHYSICIAN:  [name], [name]               DATE OF BIRTH:  09/08/1945   DATE OF PROCEDURE:  06/17/2003  DATE OF DISCHARGE:                                 OPERATIVE REPORT   PREOPERATIVE DIAGNOSIS:  Left ureteral calculus.   POSTOPERATIVE DIAGNOSIS:  Left ureteral calculus.   OPERATION/PROCEDURE:  1. Cystoscopy.  2. Left retrograde pyelogram with interpretation.  3. Left ureteroscopic stone extraction and double-J stent placement.   SURGEON:  [name], [name]   ANESTHESIA:  General.   SPECIMENS:  Stone.   DRAINS:  A 4.7-French, 26 cm double-J stent in the left ureter.   ESTIMATED BLOOD LOSS:  Less than 5 mL.   COMPLICATIONS:  None.   INDICATIONS:  The patient is a 65 year old white male who I saw last night  with an acute left renal colic.  A CT scan then revealed a 3 mm stone in his  left ureter.  He desired extraction after we discussed observation with  medication versus ureteroscopy.  I went over the procedure, the risks and  complications, alternatives as well as possible need for stent placement.  He understands and wishes to proceed.   DESCRIPTION OF PROCEDURE:  After informed consent, the patient was brought  to the major operating room, placed on the table, administered general  anesthesia, then moved to the dorsal lithotomy position. His genitalia was  sterilely prepped and draped.  The urethral meatus was somewhat tight.  I  could not get the 21-French scope in so I dilated with Sissy Hoff sounds to  24-French and then was easily able to pass this scope down the urethra which  was noted to be entirely normal to the sphincter which appeared intact.  The  prostatic urethra revealed a high bladder neck but was nonobstructing  otherwise.  The  bladder then was fully inspected and noted to be free of any  tumors, stones or inflammatory lesions but there was blood exiting the left  ureteral orifice.  The left orifice was also somewhat displaced toward the  bladder neck, but was of normal configuration.  This was relative to the  right ureteral orifice which was of normal configuration and position  relative to the trigone.   A 6-French open-ended ureteral catheter was introduced in the left ureteral  orifice and the left retrograde pyelogram was performed.  This revealed a  dilated ureter proximal to a filling defect in the distal ureter consistent  with a stone.  No other abnormality was noted.  A guide wire was passed  through the open-ended ureteral stent and up into the renal pelvis.  The  open-ended stent was removed as was the cystoscope and attempted  to pass the  6-French ureteroscope next to the guide wire, but noted ureteral orifice  would not accept the scope without dilatation.  I, therefore, removed the  ureteroscope, passed a 4 cm 15-French ureteral dilating balloon over the  guide wire and to the area of the ureteral orifice.  As I dilated, I could  see a waist that gave away and I left the balloon inflated for approximately  one minute, then deflated and removed the dilating balloon, leaving the  guide wire in place.   The 6-French ureteroscope was then passed up the left ureter.  I was able to  visualize the stone. It was photographed and then grasped with the nitinol  basket.  It was then removed along with the ureteroscope without difficulty.  I then backloaded the cystoscope over the guide wire and passed a double-J  stent into the area of the renal pelvis as the guide wire was removed.  Good  curl was noted in the renal pelvis and in the bladder.  The string was left  affixed and taped to the dorsum of the penis.  Lidocaine 2% jelly was placed  in the urethra and a B&O suppository and the patient was given  Toradol 30  mg.   He already has a prescription for pain medication.  I gave him Pyridium Plus  for bladder irritation.  We will see him back in the office in five days for  his stent removal.                                               [name], [name]    MCO/MEDQ  D:  06/17/2003  T:  06/17/2003  Job:  [id number]

## 2010-12-14 ENCOUNTER — Ambulatory Visit (INDEPENDENT_AMBULATORY_CARE_PROVIDER_SITE_OTHER): Payer: 59 | Admitting: Family Medicine

## 2010-12-14 ENCOUNTER — Encounter: Payer: Self-pay | Admitting: Family Medicine

## 2010-12-14 DIAGNOSIS — M109 Gout, unspecified: Secondary | ICD-10-CM

## 2010-12-14 DIAGNOSIS — E119 Type 2 diabetes mellitus without complications: Secondary | ICD-10-CM

## 2010-12-14 DIAGNOSIS — R609 Edema, unspecified: Secondary | ICD-10-CM | POA: Insufficient documentation

## 2010-12-14 DIAGNOSIS — I10 Essential (primary) hypertension: Secondary | ICD-10-CM

## 2010-12-14 DIAGNOSIS — R635 Abnormal weight gain: Secondary | ICD-10-CM

## 2010-12-14 LAB — COMPREHENSIVE METABOLIC PANEL
ALT: 47 U/L (ref 0–53)
AST: 45 U/L — ABNORMAL HIGH (ref 0–37)
Albumin: 4.1 g/dL (ref 3.5–5.2)
Alkaline Phosphatase: 80 U/L (ref 39–117)
BUN: 16 mg/dL (ref 6–23)
CO2: 24 mEq/L (ref 19–32)
Calcium: 9.1 mg/dL (ref 8.4–10.5)
Chloride: 109 mEq/L (ref 96–112)
Creatinine, Ser: 1 mg/dL (ref 0.4–1.5)
GFR: 84.87 mL/min (ref >60.00)
Glucose, Bld: 167 mg/dL — ABNORMAL HIGH (ref 70–99)
Potassium: 4.6 mEq/L (ref 3.5–5.1)
Sodium: 141 mEq/L (ref 135–145)
Total Bilirubin: 0.3 mg/dL (ref 0.3–1.2)
Total Protein: 6.7 g/dL (ref 6.0–8.3)

## 2010-12-14 LAB — HEMOGLOBIN A1C: Hgb A1c MFr Bld: 7 % — ABNORMAL HIGH (ref 4.6–6.5)

## 2010-12-14 LAB — D-DIMER, QUANTITATIVE: D-Dimer, Quant: 0.35 ug/mL-FEU (ref 0.00–0.48)

## 2010-12-14 LAB — TSH: TSH: 0.37 u[IU]/mL (ref 0.35–5.50)

## 2010-12-14 LAB — URIC ACID: Uric Acid, Serum: 5.5 mg/dL (ref 4.0–7.8)

## 2010-12-14 NOTE — Assessment & Plan Note (Signed)
Suspect acute on chronic venous insufficiency.  However, since pt has not noted any swelling prior to 2 d/a and since his swelling has been asymmetric, will check D-dimer today to further assess for DVT. Discussed low Na intake, gave dietary handout and reviewed it with patient.  Discussed elevation of legs prn.

## 2010-12-14 NOTE — Progress Notes (Addendum)
OFFICE VISIT  12/14/2010   CC:  Chief Complaint  Patient presents with  . Swollen right foot     HPI:    Patient is a 65 y.o. Caucasian male who presents for right lower leg and foot swelling as well as routine DM and HTN f/u. Two days ago noted swelling in R>>L lower leg and foot after spending the day working on his property.  No trauma. No pain, no redness, no fevers.  He otherwise feels well.  Admits he eats a lot of salt on his foods.  Denies SOB or DOE.  Not checking glucoses at home but is compliant with his metformin.  Doesn't eat much of a diabetic diet. No home bps to report but is compliant with med.  Past Medical History  Diagnosis Date  . Diabetes mellitus     type 2- non insulin-requiring  . Hyperlipidemia   . Hypertension     severe LVH on echo  . OSA (obstructive sleep apnea)     AHI 24 on PSG 1-12; on CPAP as per Dr Suszanne Conners  . Morbid obesity   . Gout   . GERD (gastroesophageal reflux disease)     EDG 04-03-01, + distal esophageal stricture dilation  . Fatty liver u/s and CT 2009    w/mildly elevated transaminases    Past Surgical History  Procedure Date  . Colonoscopy x 2    Dr. Jarold Motto w/ [facility]  . Tee without cardioversion 05/2010    Dr. Jacinto Halim; severe LVH, no valvular abnormalities  . Cardiovascular stress test 2006    Normal stress nuclear study    Outpatient Prescriptions Prior to Visit  Medication Sig Dispense Refill  . allopurinol (ZYLOPRIM) 300 MG tablet Take 300 mg by mouth daily. For gout       . aspirin 325 MG tablet Take 325 mg by mouth daily.        . carvedilol (COREG) 6.25 MG tablet Take 6.25 mg by mouth 2 (two) times daily.        Marland Kitchen esomeprazole (NEXIUM) 40 MG capsule Take 40 mg by mouth every other day.        . lisinopril (PRINIVIL,ZESTRIL) 20 MG tablet Take 20 mg by mouth daily.        . metFORMIN (GLUCOPHAGE) 500 MG tablet Take 500 mg by mouth 2 (two) times daily with a meal.        . NON FORMULARY Stool softner- at bedtime        . pravastatin (PRAVACHOL) 40 MG tablet Take 40 mg by mouth daily.        . temazepam (RESTORIL) 15 MG capsule Take 15 mg by mouth at bedtime.          No Known Allergies  ROS As per HPI  PE: Blood pressure 138/90, pulse 60, temperature 98.1 F (36.7 C), temperature source Oral, weight 290 lb (131.543 kg). Gen: Alert, well appearing, obese white male.  Patient is oriented to person, place, time, and situation.  Pleasant affect. Chest: symmetric expansion, nonlabored respirations.  Clear and equal breath sounds in all lung fields.   CV: RRR, 3/6 systolic murmur (unchanged), no rub or gallop.  Peripheral pulses 2+ and symmetric. EXT: no erythema or warmth.  DP and PT pulses 2+ bilaterally. Mild increased freckling/bronze hue to anterior tibial surfaces bilat.   2+ pitting edema bilat in lower legs down into ankles and dorsum of both feet.  Calf circumference 43 1/2 cm on right, 45 1/2 cm  on left. No tenderness, no cord palpated, no pain with ankle or lower leg ROM on either side.  LABS:  none  IMPRESSION AND PLAN:  Edema Suspect acute on chronic venous insufficiency.  However, since pt has not noted any swelling prior to 2 d/a and since his swelling has been asymmetric, will check D-dimer today to further assess for DVT. Discussed low Na intake, gave dietary handout and reviewed it with patient.  Discussed elevation of legs prn.  ESSENTIAL HYPERTENSION Problem stable.  Continue current medications and diet appropriate for this condition.  We have reviewed our general long term plan for this problem and also reviewed symptoms and signs that should prompt the patient to call or return to the office.   DIABETES MELLITUS, TYPE II, CONTROLLED, W/O COMPLICATIONS Noncompliant with home glucose monitoring and partial noncompliance with diet. Encouraged him to get better with both of these things. Continue current med for now, check HbA1c today. He needs repeat urine microalbumin/cr  after 02/2011.   Hx of elevated transaminases and fatty liver: these were about 2 X normal at his last primary MD 12/2009 and were about the same here 06/2010. Will recheck today.    FOLLOW UP: Return in about 4 months (around 04/15/2011) for f/u DM 2.Marland Kitchen

## 2010-12-14 NOTE — Assessment & Plan Note (Signed)
Noncompliant with home glucose monitoring and partial noncompliance with diet. Encouraged him to get better with both of these things. Continue current med for now, check HbA1c today. He needs repeat urine microalbumin/cr after 02/2011.

## 2010-12-14 NOTE — Assessment & Plan Note (Signed)
Problem stable.  Continue current medications and diet appropriate for this condition.  We have reviewed our general long term plan for this problem and also reviewed symptoms and signs that should prompt the patient to call or return to the office.  

## 2010-12-16 LAB — NMR LIPOPROFILE WITHOUT LIPIDS
HDL Particle Number: 25 umol/L — ABNORMAL LOW (ref >=30.5)
LDL Particle Number: 1728 nmol/L — ABNORMAL HIGH (ref <1000)
LDL Size: 20.9 nm (ref >20.5)
Small LDL Particle Number: 944 nmol/L — ABNORMAL HIGH (ref <=527)

## 2010-12-18 ENCOUNTER — Other Ambulatory Visit: Payer: Self-pay | Admitting: Family Medicine

## 2010-12-18 MED ORDER — PRAVASTATIN SODIUM 80 MG PO TABS: 80.0000 mg | ORAL_TABLET | Freq: Every day | ORAL | Status: DC

## 2010-12-18 MED ORDER — METFORMIN HCL 850 MG PO TABS: 850.0000 mg | ORAL_TABLET | Freq: Two times a day (BID) | ORAL | Status: DC

## 2011-01-11 ENCOUNTER — Telehealth: Payer: Self-pay | Admitting: *Deleted

## 2011-01-11 NOTE — Telephone Encounter (Signed)
Faxed request from pharmacy to change quantity from #30 to #60 on Metformin.  Pt takes BID.  Quantity changed verbally with pharmacist.  medlist updated.

## 2011-02-26 ENCOUNTER — Ambulatory Visit (INDEPENDENT_AMBULATORY_CARE_PROVIDER_SITE_OTHER): Payer: 59 | Admitting: *Deleted

## 2011-02-26 DIAGNOSIS — Z23 Encounter for immunization: Secondary | ICD-10-CM

## 2011-05-27 ENCOUNTER — Other Ambulatory Visit: Payer: Self-pay | Admitting: Family Medicine

## 2011-05-27 MED ORDER — ESOMEPRAZOLE MAGNESIUM 40 MG PO CPDR: 40.0000 mg | DELAYED_RELEASE_CAPSULE | ORAL | Status: DC

## 2011-05-27 MED ORDER — ALLOPURINOL 300 MG PO TABS: 300.0000 mg | ORAL_TABLET | Freq: Every day | ORAL | Status: DC

## 2011-05-27 MED ORDER — ESOMEPRAZOLE MAGNESIUM 40 MG PO CPDR: 40.0000 mg | DELAYED_RELEASE_CAPSULE | Freq: Every day | ORAL | Status: DC

## 2011-05-27 NOTE — Telephone Encounter (Signed)
Addended by: Luisa Dago on: 05/27/2011 03:44 PM   Modules accepted: Orders

## 2011-05-27 NOTE — Telephone Encounter (Signed)
Last seen 8/17 should follow up 04/15/11.  30 day supply sent.  Message left on voicemail to call to schedule appt.

## 2011-05-27 NOTE — Telephone Encounter (Signed)
PC from [place] at [place] questioning dose of Nexium.  RX was sent in for QOD, but pt has been on up to 3 capsules daily.  PC to pt and spoke to wife.  She states he is taking once daily.  I will correct with pharmacy.  Appt scheduled for 06/06/11.

## 2011-06-06 ENCOUNTER — Ambulatory Visit (INDEPENDENT_AMBULATORY_CARE_PROVIDER_SITE_OTHER): Payer: 59 | Admitting: Family Medicine

## 2011-06-06 ENCOUNTER — Encounter: Payer: Self-pay | Admitting: Family Medicine

## 2011-06-06 VITALS — BP 147/80 | HR 73 | Ht 68.5 in | Wt 285.0 lb

## 2011-06-06 DIAGNOSIS — H0019 Chalazion unspecified eye, unspecified eyelid: Secondary | ICD-10-CM

## 2011-06-06 DIAGNOSIS — E785 Hyperlipidemia, unspecified: Secondary | ICD-10-CM

## 2011-06-06 DIAGNOSIS — Z23 Encounter for immunization: Secondary | ICD-10-CM

## 2011-06-06 DIAGNOSIS — E119 Type 2 diabetes mellitus without complications: Secondary | ICD-10-CM

## 2011-06-06 DIAGNOSIS — I10 Essential (primary) hypertension: Secondary | ICD-10-CM

## 2011-06-06 LAB — COMPREHENSIVE METABOLIC PANEL
ALT: 56 U/L — ABNORMAL HIGH (ref 0–53)
AST: 49 U/L — ABNORMAL HIGH (ref 0–37)
Albumin: 4.1 g/dL (ref 3.5–5.2)
Alkaline Phosphatase: 71 U/L (ref 39–117)
BUN: 15 mg/dL (ref 6–23)
CO2: 24 mEq/L (ref 19–32)
Calcium: 9.3 mg/dL (ref 8.4–10.5)
Chloride: 109 mEq/L (ref 96–112)
Creatinine, Ser: 1.1 mg/dL (ref 0.4–1.5)
GFR: 73.09 mL/min (ref >60.00)
Glucose, Bld: 113 mg/dL — ABNORMAL HIGH (ref 70–99)
Potassium: 4.1 mEq/L (ref 3.5–5.1)
Sodium: 141 mEq/L (ref 135–145)
Total Bilirubin: 0.8 mg/dL (ref 0.3–1.2)
Total Protein: 6.9 g/dL (ref 6.0–8.3)

## 2011-06-06 LAB — LIPID PANEL
Cholesterol: 157 mg/dL (ref 0–200)
HDL: 40 mg/dL (ref >39.00)
LDL Cholesterol: 88 mg/dL (ref 0–99)
Total CHOL/HDL Ratio: 4
Triglycerides: 144 mg/dL (ref 0.0–149.0)
VLDL: 28.8 mg/dL (ref 0.0–40.0)

## 2011-06-06 LAB — HEMOGLOBIN A1C: Hgb A1c MFr Bld: 7 % — ABNORMAL HIGH (ref 4.6–6.5)

## 2011-06-06 LAB — MICROALBUMIN / CREATININE URINE RATIO
Creatinine,U: 178 mg/dL
Microalb Creat Ratio: 0.6 mg/g (ref 0.0–30.0)
Microalb, Ur: 1 mg/dL (ref 0.0–1.9)

## 2011-06-06 NOTE — Assessment & Plan Note (Signed)
Noncompliant with diet and home monitoring.  Encouraged pt to try to do both of these more. Cont. Current meds. Check A1c and urine microalb/cr today.  Foot exam normal today. Encouraged him to make appt with his opthalm b/c he is due for annual diab ret screening. Pneumovax given today due to pt being high risk status.

## 2011-06-06 NOTE — Progress Notes (Signed)
OFFICE VISIT  06/06/2011   CC:  Chief Complaint  Patient presents with  . Follow-up    DM, HTN     HPI:    Patient is a 66 y.o. Caucasian male who presents for 73mo f/u DM2, HTN, Hyperlipidemia. Reports he is noncompliant with diab diet and monitoring of glucoses. He has been compliant with meds.  No side effects. Denies pain, tingling, or numbness in feet.  Has small "stye" on left lower eyelid x few days.  No eye drainage or pain.  Nothing has been applied.   No vision deficits/changes.  He says he has not had a diab retin screening eye exam in the last year.  He has an eye MD, has no hx of diab retinpthy that he knows of.     Past Medical History  Diagnosis Date  . Diabetes mellitus     type 2- non insulin-requiring  . Hyperlipidemia   . Hypertension     severe LVH on echo  . OSA (obstructive sleep apnea)     AHI 24 on PSG 1-12; on CPAP as per Dr Suszanne Conners  . Morbid obesity   . Gout   . GERD (gastroesophageal reflux disease)     EDG 04-03-01, + distal esophageal stricture dilation  . Fatty liver u/s and CT 2009    w/mildly elevated transaminases    Past Surgical History  Procedure Date  . Colonoscopy x 2    Dr. Jarold Motto w/ [facility]  . Tee without cardioversion 05/2010    Dr. Jacinto Halim; severe LVH, no valvular abnormalities  . Cardiovascular stress test 2006    Normal stress nuclear study    Outpatient Prescriptions Prior to Visit  Medication Sig Dispense Refill  . allopurinol (ZYLOPRIM) 300 MG tablet Take 1 tablet (300 mg total) by mouth daily. For gout  30 tablet  0  . aspirin 325 MG tablet Take 325 mg by mouth daily.        . carvedilol (COREG) 6.25 MG tablet Take 6.25 mg by mouth 2 (two) times daily.        Marland Kitchen esomeprazole (NEXIUM) 40 MG capsule Take 1 capsule (40 mg total) by mouth daily.  30 capsule  0  . lisinopril (PRINIVIL,ZESTRIL) 20 MG tablet Take 20 mg by mouth daily.        . metFORMIN (GLUCOPHAGE) 850 MG tablet Take 1 tablet (850 mg total) by mouth 2  (two) times daily with a meal.  30 tablet  6  . pravastatin (PRAVACHOL) 80 MG tablet Take 1 tablet (80 mg total) by mouth daily.  30 tablet  6  . temazepam (RESTORIL) 15 MG capsule Take 15 mg by mouth at bedtime.        . NON FORMULARY Stool softner- at bedtime        No Known Allergies  ROS As per HPI, also no CP, no HAs, no palpitations, no abd pains, no acute changes in chronic LE swelling.  PE: Blood pressure 147/80, pulse 73, height 5' 8.5" (1.74 m), weight 285 lb (129.275 kg). Gen: Alert, well appearing.  Patient is oriented to person, place, time, and situation. EYES: left eye lower lid near lateral canthus there is a small white bump.  No significant swelling, no drainage, no conjunctival injection.  CV: RRR, 3/6 holosystolic murmur heard over entire precordium Chest with symmetric expansion, good aeration, nonlabored respirations.  Clear and equal breath sounds in all lung fields.  No clubbing or cyanosis. EXT: 1-2+ pitting edema on right  LL with freckling changes, trace -1+ similar pitting/changes on left. No erythema or tenderness. DP and PT pulses 2+ bilat.  No calluses or skin breakdown.  Nails minimally hypertrophic.  Sensation intact.  No bony deformity.  LABS:  None today  IMPRESSION AND PLAN:  DIABETES MELLITUS, TYPE II, CONTROLLED, W/O COMPLICATIONS Noncompliant with diet and home monitoring.  Encouraged pt to try to do both of these more. Cont. Current meds. Check A1c and urine microalb/cr today.  Foot exam normal today. Encouraged him to make appt with his opthalm b/c he is due for annual diab ret screening. Pneumovax given today due to pt being high risk status.  HYPERLIPIDEMIA Will recheck FLP today to see if the increased pravastatin dose that we started 74mo ago helped.  ESSENTIAL HYPERTENSION Problem stable.  Continue current medications and diet appropriate for this condition.  We have reviewed our general long term plan for this problem and also  reviewed symptoms and signs that should prompt the patient to call or return to the office. Check lytes/cr today.  Chalazion Left lower lid, very small/beginning.  Encouraged hot soaks with J&J baby shampoo bid-tid.     FOLLOW UP: Return in about 6 months (around 12/04/2011) for f/u DM, HTN, hyperlip.

## 2011-06-06 NOTE — Assessment & Plan Note (Signed)
Will recheck FLP today to see if the increased pravastatin dose that we started 24mo ago helped.

## 2011-06-06 NOTE — Assessment & Plan Note (Signed)
Left lower lid, very small/beginning.  Encouraged hot soaks with J&J baby shampoo bid-tid.

## 2011-06-06 NOTE — Assessment & Plan Note (Signed)
Problem stable.  Continue current medications and diet appropriate for this condition.  We have reviewed our general long term plan for this problem and also reviewed symptoms and signs that should prompt the patient to call or return to the office. Check lytes/cr today.  

## 2011-06-10 ENCOUNTER — Encounter: Payer: Self-pay | Admitting: Family Medicine

## 2011-06-26 ENCOUNTER — Encounter: Payer: Self-pay | Admitting: Gastroenterology

## 2011-07-05 ENCOUNTER — Other Ambulatory Visit: Payer: Self-pay | Admitting: *Deleted

## 2011-07-05 MED ORDER — ESOMEPRAZOLE MAGNESIUM 40 MG PO CPDR: 40.0000 mg | DELAYED_RELEASE_CAPSULE | Freq: Every day | ORAL | Status: DC

## 2011-07-05 NOTE — Telephone Encounter (Signed)
Faxed refill request received from pharmacy.  Last seen on 06/06/11 with follow up needed in 11/2011.  RX sent x 1 year.

## 2011-07-10 ENCOUNTER — Other Ambulatory Visit: Payer: Self-pay | Admitting: *Deleted

## 2011-07-10 MED ORDER — ALLOPURINOL 300 MG PO TABS: 300.0000 mg | ORAL_TABLET | Freq: Every day | ORAL | Status: DC

## 2011-07-10 NOTE — Telephone Encounter (Signed)
Faxed refill request received from pharmacy for allopurinol Last seen on 06/06/11 Follow up in 6 months. RX sent.

## 2011-10-02 ENCOUNTER — Other Ambulatory Visit: Payer: Self-pay

## 2011-10-02 MED ORDER — PRAVASTATIN SODIUM 80 MG PO TABS: 80.0000 mg | ORAL_TABLET | Freq: Every day | ORAL | Status: DC

## 2011-10-14 ENCOUNTER — Ambulatory Visit (INDEPENDENT_AMBULATORY_CARE_PROVIDER_SITE_OTHER): Payer: 59 | Admitting: Family Medicine

## 2011-10-14 ENCOUNTER — Encounter: Payer: Self-pay | Admitting: Family Medicine

## 2011-10-14 VITALS — BP 104/74 | HR 72 | Temp 97.8°F | Ht 68.25 in | Wt 285.0 lb

## 2011-10-14 DIAGNOSIS — S336XXA Sprain of sacroiliac joint, initial encounter: Secondary | ICD-10-CM

## 2011-10-14 DIAGNOSIS — S335XXA Sprain of ligaments of lumbar spine, initial encounter: Secondary | ICD-10-CM | POA: Insufficient documentation

## 2011-10-14 MED ORDER — CYCLOBENZAPRINE HCL 10 MG PO TABS: 10.0000 mg | ORAL_TABLET | Freq: Three times a day (TID) | ORAL | Status: AC | PRN

## 2011-10-14 MED ORDER — HYDROCODONE-ACETAMINOPHEN 5-500 MG PO TABS: 1.0000 | ORAL_TABLET | Freq: Four times a day (QID) | ORAL | Status: AC | PRN

## 2011-10-14 NOTE — Assessment & Plan Note (Signed)
Discussed general comfort measures. He can continue alleve bid with food. Vicodin 5/500 1-2 q6h prn, #30, no RF. Flexeril 10mg  q8h prn, #30, no RF.  Therapeutic expectations and side effect profile of medication discussed today.  Patient's questions answered. Went over correct lifting technique, reviewed low back stretches and gave pt handout, advised heat bid-tid for 20 min at a time.

## 2011-10-14 NOTE — Progress Notes (Signed)
OFFICE NOTE  10/14/2011  CC:  Chief Complaint  Patient presents with  . Back Pain    Sunday AM     HPI: Patient is a 66 y.o. Caucasian male who is here for back pain. Bent over yesterday morning to attach a chain to his golf cart and felt immediate pain/pull in right low back region.  No radiation, no paresthesias, no leg weakness.  Made worse with walking and bending. Improved minimally with alleve, ice, rest.  Pertinent PMH:  Past Medical History  Diagnosis Date  . Diabetes mellitus     type 2- non insulin-requiring  . Hyperlipidemia   . Hypertension     severe LVH on echo  . OSA (obstructive sleep apnea)     Could not tolerate CPAP, not surgical candidate per Dr. Suszanne Conners  . Morbid obesity   . Gout   . GERD (gastroesophageal reflux disease)     EDG 04-03-01, + distal esophageal stricture dilation  . Fatty liver u/s and CT 2009    w/mildly elevated transaminases  . Dyspnea     Cardiology w/u unrevealing except LVOT obstruction: suspect dyspnea due to morbid obesity and OSA  No significant history of back problems in the past.  Past surgical, social, and family history reviewed and no changes noted since last office visit.  MEDS:  Outpatient Prescriptions Prior to Visit  Medication Sig Dispense Refill  . allopurinol (ZYLOPRIM) 300 MG tablet Take 1 tablet (300 mg total) by mouth daily. For gout  30 tablet  5  . aspirin 325 MG tablet Take 325 mg by mouth daily.        Tery Sanfilippo Calcium (STOOL SOFTENER PO) Take 1 capsule by mouth at bedtime.      Marland Kitchen esomeprazole (NEXIUM) 40 MG capsule Take 1 capsule (40 mg total) by mouth daily.  30 capsule  10  . lisinopril (PRINIVIL,ZESTRIL) 20 MG tablet Take 20 mg by mouth daily.        . metFORMIN (GLUCOPHAGE) 850 MG tablet Take 1 tablet (850 mg total) by mouth 2 (two) times daily with a meal.  30 tablet  6  . metoprolol tartrate (LOPRESSOR) 25 MG tablet Take 25 mg by mouth 2 (two) times daily. As per Dr. Jacinto Halim      . pravastatin  (PRAVACHOL) 80 MG tablet Take 1 tablet (80 mg total) by mouth daily.  30 tablet  2  . temazepam (RESTORIL) 15 MG capsule Take 15 mg by mouth at bedtime.          PE: Blood pressure 104/74, pulse 72, temperature 97.8 F (36.6 C), temperature source Temporal, height 5' 8.25" (1.734 m), weight 285 lb (129.275 kg). Gen: Alert, well appearing.  Patient is oriented to person, place, time, and situation.  Walks slowly and slight bent over. Right low back paraspinous soft tissues with minimal tenderness, no muscle abnormality palpable. ROM: severely restricted in forward flexion and extension due to pain.  He can rotate and flex laterally to the right ok but to the left he has pain.  With sitting SLR he has pain in right lumbar spine region at 45 degrees, no pain on SLR on left.  Strength 5/5 bilat prox and dist in LE's, Patellar DTRs 1+ bilat, achilles DTRs trace bilat.  IMPRESSION AND PLAN:  Low back sprain Discussed general comfort measures. He can continue alleve bid with food. Vicodin 5/500 1-2 q6h prn, #30, no RF. Flexeril 10mg  q8h prn, #30, no RF.  Therapeutic expectations and side effect  profile of medication discussed today.  Patient's questions answered. Went over correct lifting technique, reviewed low back stretches and gave pt handout, advised heat bid-tid for 20 min at a time.     FOLLOW UP: prn (has routine chronic illness f/u already scheduled for August 2013.

## 2011-10-14 NOTE — Patient Instructions (Signed)
Low Back Sprain with Rehab  A sprain is an injury in which a ligament is torn. The ligaments of the lower back are vulnerable to sprains. However, they are strong and require great force to be injured. These ligaments are important for stabilizing the spinal column. Sprains are classified into three categories. Grade 1 sprains cause pain, but the tendon is not lengthened. Grade 2 sprains include a lengthened ligament, due to the ligament being stretched or partially ruptured. With grade 2 sprains there is still function, although the function may be decreased. Grade 3 sprains involve a complete tear of the tendon or muscle, and function is usually impaired. SYMPTOMS   Severe pain in the lower back.   Sometimes, a feeling of a "pop," "snap," or tear, at the time of injury.   Tenderness and sometimes swelling at the injury site.   Uncommonly, bruising (contusion) within 48 hours of injury.   Muscle spasms in the back.  CAUSES  Low back sprains occur when a force is placed on the ligaments that is greater than they can handle. Common causes of injury include:  Performing a stressful act while off-balance.   Repetitive stressful activities that involve movement of the lower back.   Direct hit (trauma) to the lower back.  RISK INCREASES WITH:  Contact sports (football, wrestling).   Collisions (major skiing accidents).   Sports that require throwing or lifting (baseball, weightlifting).   Sports involving twisting of the spine (gymnastics, diving, tennis, golf).   Poor strength and flexibility.   Inadequate protection.   Previous back injury or surgery (especially fusion).  PREVENTION  Wear properly fitted and padded protective equipment.   Warm up and stretch properly before activity.   Allow for adequate recovery between workouts.   Maintain physical fitness:   Strength, flexibility, and endurance.   Cardiovascular fitness.   Maintain a healthy body weight.  PROGNOSIS   If treated properly, low back sprains usually heal with non-surgical treatment. The length of time for healing depends on the severity of the injury.  RELATED COMPLICATIONS   Recurring symptoms, resulting in a chronic problem.   Chronic inflammation and pain in the low back.   Delayed healing or resolution of symptoms, especially if activity is resumed too soon.   Prolonged impairment.   Unstable or arthritic joints of the low back.  TREATMENT  Treatment first involves the use of ice and medicine, to reduce pain and inflammation. The use of strengthening and stretching exercises may help reduce pain with activity. These exercises may be performed at home or with a therapist. Severe injuries may require referral to a therapist for further evaluation and treatment, such as ultrasound. Your caregiver may advise that you wear a back brace or corset, to help reduce pain and discomfort. Often, prolonged bed rest results in greater harm then benefit. Corticosteroid injections may be recommended. However, these should be reserved for the most serious cases. It is important to avoid using your back when lifting objects. At night, sleep on your back on a firm mattress, with a pillow placed under your knees. If non-surgical treatment is unsuccessful, surgery may be needed.  MEDICATION   If pain medicine is needed, nonsteroidal anti-inflammatory medicines (aspirin and ibuprofen), or other minor pain relievers (acetaminophen), are often advised.   Do not take pain medicine for 7 days before surgery.   Prescription pain relievers may be given, if your caregiver thinks they are needed. Use only as directed and only as much as you   need.   Ointments applied to the skin may be helpful.   Corticosteroid injections may be given by your caregiver. These injections should be reserved for the most serious cases, because they may only be given a certain number of times.  HEAT AND COLD  Cold treatment (icing)  should be applied for 10 to 15 minutes every 2 to 3 hours for inflammation and pain, and immediately after activity that aggravates your symptoms. Use ice packs or an ice massage.   Heat treatment may be used before performing stretching and strengthening activities prescribed by your caregiver, physical therapist, or athletic trainer. Use a heat pack or a warm water soak.  SEEK MEDICAL CARE IF:   Symptoms get worse or do not improve in 2 to 4 weeks, despite treatment.   You develop numbness or weakness in either leg.   You lose bowel or bladder function.   Any of the following occur after surgery: fever, increased pain, swelling, redness, drainage of fluids, or bleeding in the affected area.   New, unexplained symptoms develop. (Drugs used in treatment may produce side effects.)  EXERCISES  RANGE OF MOTION (ROM) AND STRETCHING EXERCISES - Low Back Sprain Most people with lower back pain will find that their symptoms get worse with excessive bending forward (flexion) or arching at the lower back (extension). The exercises that will help resolve your symptoms will focus on the opposite motion.  Your physician, physical therapist or athletic trainer will help you determine which exercises will be most helpful to resolve your lower back pain. Do not complete any exercises without first consulting with your caregiver. Discontinue any exercises which make your symptoms worse, until you speak to your caregiver. If you have pain, numbness or tingling which travels down into your buttocks, leg or foot, the goal of the therapy is for these symptoms to move closer to your back and eventually resolve. Sometimes, these leg symptoms will get better, but your lower back pain may worsen. This is often an indication of progress in your rehabilitation. Be very alert to any changes in your symptoms and the activities in which you participated in the 24 hours prior to the change. Sharing this information with your  caregiver will allow him or her to most efficiently treat your condition. These exercises may help you when beginning to rehabilitate your injury. Your symptoms may resolve with or without further involvement from your physician, physical therapist or athletic trainer. While completing these exercises, remember:   Restoring tissue flexibility helps normal motion to return to the joints. This allows healthier, less painful movement and activity.   An effective stretch should be held for at least 30 seconds.   A stretch should never be painful. You should only feel a gentle lengthening or release in the stretched tissue.  FLEXION RANGE OF MOTION AND STRETCHING EXERCISES: STRETCH - Flexion, Single Knee to Chest   Lie on a firm bed or floor with both legs extended in front of you.   Keeping one leg in contact with the floor, bring your opposite knee to your chest. Hold your leg in place by either grabbing behind your thigh or at your knee.   Pull until you feel a gentle stretch in your low back. Hold __________ seconds.   Slowly release your grasp and repeat the exercise with the opposite side.  Repeat __________ times. Complete this exercise __________ times per day.  STRETCH - Flexion, Double Knee to Chest  Lie on a firm bed or   floor with both legs extended in front of you.   Keeping one leg in contact with the floor, bring your opposite knee to your chest.   Tense your stomach muscles to support your back and then lift your other knee to your chest. Hold your legs in place by either grabbing behind your thighs or at your knees.   Pull both knees toward your chest until you feel a gentle stretch in your low back. Hold __________ seconds.   Tense your stomach muscles and slowly return one leg at a time to the floor.  Repeat __________ times. Complete this exercise __________ times per day.  STRETCH - Low Trunk Rotation  Lie on a firm bed or floor. Keeping your legs in front of you, bend  your knees so they are both pointed toward the ceiling and your feet are flat on the floor.   Extend your arms out to the side. This will stabilize your upper body by keeping your shoulders in contact with the floor.   Gently and slowly drop both knees together to one side until you feel a gentle stretch in your low back. Hold for __________ seconds.   Tense your stomach muscles to support your lower back as you bring your knees back to the starting position. Repeat the exercise to the other side.  Repeat __________ times. Complete this exercise __________ times per day  EXTENSION RANGE OF MOTION AND FLEXIBILITY EXERCISES: STRETCH - Extension, Prone on Elbows   Lie on your stomach on the floor, a bed will be too soft. Place your palms about shoulder width apart and at the height of your head.   Place your elbows under your shoulders. If this is too painful, stack pillows under your chest.   Allow your body to relax so that your hips drop lower and make contact more completely with the floor.   Hold this position for __________ seconds.   Slowly return to lying flat on the floor.  Repeat __________ times. Complete this exercise __________ times per day.  RANGE OF MOTION - Extension, Prone Press Ups  Lie on your stomach on the floor, a bed will be too soft. Place your palms about shoulder width apart and at the height of your head.   Keeping your back as relaxed as possible, slowly straighten your elbows while keeping your hips on the floor. You may adjust the placement of your hands to maximize your comfort. As you gain motion, your hands will come more underneath your shoulders.   Hold this position __________ seconds.   Slowly return to lying flat on the floor.  Repeat __________ times. Complete this exercise __________ times per day.  RANGE OF MOTION- Quadruped, Neutral Spine   Assume a hands and knees position on a firm surface. Keep your hands under your shoulders and your knees  under your hips. You may place padding under your knees for comfort.   Drop your head and point your tailbone toward the ground below you. This will round out your lower back like an angry cat. Hold this position for __________ seconds.   Slowly lift your head and release your tail bone so that your back sags into a large arch, like an old horse.   Hold this position for __________ seconds.   Repeat this until you feel limber in your low back.   Now, find your "sweet spot." This will be the most comfortable position somewhere between the two previous positions. This is your neutral spine.   Once you have found this position, tense your stomach muscles to support your low back.   Hold this position for __________ seconds.  Repeat __________ times. Complete this exercise __________ times per day.  STRENGTHENING EXERCISES - Low Back Sprain These exercises may help you when beginning to rehabilitate your injury. These exercises should be done near your "sweet spot." This is the neutral, low-back arch, somewhere between fully rounded and fully arched, that is your least painful position. When performed in this safe range of motion, these exercises can be used for people who have either a flexion or extension based injury. These exercises may resolve your symptoms with or without further involvement from your physician, physical therapist or athletic trainer. While completing these exercises, remember:   Muscles can gain both the endurance and the strength needed for everyday activities through controlled exercises.   Complete these exercises as instructed by your physician, physical therapist or athletic trainer. Increase the resistance and repetitions only as guided.   You may experience muscle soreness or fatigue, but the pain or discomfort you are trying to eliminate should never worsen during these exercises. If this pain does worsen, stop and make certain you are following the directions exactly.  If the pain is still present after adjustments, discontinue the exercise until you can discuss the trouble with your caregiver.  STRENGTHENING - Deep Abdominals, Pelvic Tilt   Lie on a firm bed or floor. Keeping your legs in front of you, bend your knees so they are both pointed toward the ceiling and your feet are flat on the floor.   Tense your lower abdominal muscles to press your low back into the floor. This motion will rotate your pelvis so that your tail bone is scooping upwards rather than pointing at your feet or into the floor.  With a gentle tension and even breathing, hold this position for __________ seconds. Repeat __________ times. Complete this exercise __________ times per day.  STRENGTHENING - Abdominals, Crunches   Lie on a firm bed or floor. Keeping your legs in front of you, bend your knees so they are both pointed toward the ceiling and your feet are flat on the floor. Cross your arms over your chest.   Slightly tip your chin down without bending your neck.   Tense your abdominals and slowly lift your trunk high enough to just clear your shoulder blades. Lifting higher can put excessive stress on the lower back and does not further strengthen your abdominal muscles.   Control your return to the starting position.  Repeat __________ times. Complete this exercise __________ times per day.  STRENGTHENING - Quadruped, Opposite UE/LE Lift   Assume a hands and knees position on a firm surface. Keep your hands under your shoulders and your knees under your hips. You may place padding under your knees for comfort.   Find your neutral spine and gently tense your abdominal muscles so that you can maintain this position. Your shoulders and hips should form a rectangle that is parallel with the floor and is not twisted.   Keeping your trunk steady, lift your right hand no higher than your shoulder and then your left leg no higher than your hip. Make sure you are not holding your  breath. Hold this position for __________ seconds.   Continuing to keep your abdominal muscles tense and your back steady, slowly return to your starting position. Repeat with the opposite arm and leg.  Repeat __________ times. Complete this exercise __________ times   per day.  STRENGTHENING - Abdominals and Quadriceps, Straight Leg Raise   Lie on a firm bed or floor with both legs extended in front of you.   Keeping one leg in contact with the floor, bend the other knee so that your foot can rest flat on the floor.   Find your neutral spine, and tense your abdominal muscles to maintain your spinal position throughout the exercise.   Slowly lift your straight leg off the floor about 6 inches for a count of 15, making sure to not hold your breath.   Still keeping your neutral spine, slowly lower your leg all the way to the floor.  Repeat this exercise with each leg __________ times. Complete this exercise __________ times per day. POSTURE AND BODY MECHANICS CONSIDERATIONS - Low Back Sprain Keeping correct posture when sitting, standing or completing your activities will reduce the stress put on different body tissues, allowing injured tissues a chance to heal and limiting painful experiences. The following are general guidelines for improved posture. Your physician or physical therapist will provide you with any instructions specific to your needs. While reading these guidelines, remember:  The exercises prescribed by your provider will help you have the flexibility and strength to maintain correct postures.   The correct posture provides the best environment for your joints to work. All of your joints have less wear and tear when properly supported by a spine with good posture. This means you will experience a healthier, less painful body.   Correct posture must be practiced with all of your activities, especially prolonged sitting and standing. Correct posture is as important when doing  repetitive low-stress activities (typing) as it is when doing a single heavy-load activity (lifting).  RESTING POSITIONS Consider which positions are most painful for you when choosing a resting position. If you have pain with flexion-based activities (sitting, bending, stooping, squatting), choose a position that allows you to rest in a less flexed posture. You would want to avoid curling into a fetal position on your side. If your pain worsens with extension-based activities (prolonged standing, working overhead), avoid resting in an extended position such as sleeping on your stomach. Most people will find more comfort when they rest with their spine in a more neutral position, neither too rounded nor too arched. Lying on a non-sagging bed on your side with a pillow between your knees, or on your back with a pillow under your knees will often provide some relief. Keep in mind, being in any one position for a prolonged period of time, no matter how correct your posture, can still lead to stiffness. PROPER SITTING POSTURE In order to minimize stress and discomfort on your spine, you must sit with correct posture. Sitting with good posture should be effortless for a healthy body. Returning to good posture is a gradual process. Many people can work toward this most comfortably by using various supports until they have the flexibility and strength to maintain this posture on their own. When sitting with proper posture, your ears will fall over your shoulders and your shoulders will fall over your hips. You should use the back of the chair to support your upper back. Your lower back will be in a neutral position, just slightly arched. You may place a small pillow or folded towel at the base of your lower back for  support.  When working at a desk, create an environment that supports good, upright posture. Without extra support, muscles tire, which leads to excessive   strain on joints and other tissues. Keep these  recommendations in mind: CHAIR:  A chair should be able to slide under your desk when your back makes contact with the back of the chair. This allows you to work closely.   The chair's height should allow your eyes to be level with the upper part of your monitor and your hands to be slightly lower than your elbows.  BODY POSITION  Your feet should make contact with the floor. If this is not possible, use a foot rest.   Keep your ears over your shoulders. This will reduce stress on your neck and low back.  INCORRECT SITTING POSTURES  If you are feeling tired and unable to assume a healthy sitting posture, do not slouch or slump. This puts excessive strain on your back tissues, causing more damage and pain. Healthier options include:  Using more support, like a lumbar pillow.   Switching tasks to something that requires you to be upright or walking.   Talking a brief walk.   Lying down to rest in a neutral-spine position.  PROLONGED STANDING WHILE SLIGHTLY LEANING FORWARD  When completing a task that requires you to lean forward while standing in one place for a long time, place either foot up on a stationary 2-4 inch high object to help maintain the best posture. When both feet are on the ground, the lower back tends to lose its slight inward curve. If this curve flattens (or becomes too large), then the back and your other joints will experience too much stress, tire more quickly, and can cause pain. CORRECT STANDING POSTURES Proper standing posture should be assumed with all daily activities, even if they only take a few moments, like when brushing your teeth. As in sitting, your ears should fall over your shoulders and your shoulders should fall over your hips. You should keep a slight tension in your abdominal muscles to brace your spine. Your tailbone should point down to the ground, not behind your body, resulting in an over-extended swayback posture.  INCORRECT STANDING POSTURES    Common incorrect standing postures include a forward head, locked knees and/or an excessive swayback. WALKING Walk with an upright posture. Your ears, shoulders and hips should all line-up. PROLONGED ACTIVITY IN A FLEXED POSITION When completing a task that requires you to bend forward at your waist or lean over a low surface, try to find a way to stabilize 3 out of 4 of your limbs. You can place a hand or elbow on your thigh or rest a knee on the surface you are reaching across. This will provide you more stability, so that your muscles do not tire as quickly. By keeping your knees relaxed, or slightly bent, you will also reduce stress across your lower back. CORRECT LIFTING TECHNIQUES DO :  Assume a wide stance. This will provide you more stability and the opportunity to get as close as possible to the object which you are lifting.   Tense your abdominals to brace your spine. Bend at the knees and hips. Keeping your back locked in a neutral-spine position, lift using your leg muscles. Lift with your legs, keeping your back straight.   Test the weight of unknown objects before attempting to lift them.   Try to keep your elbows locked down at your sides in order get the best strength from your shoulders when carrying an object.   Always ask for help when lifting heavy or awkward objects.  INCORRECT LIFTING TECHNIQUES DO   NOT:   Lock your knees when lifting, even if it is a small object.   Bend and twist. Pivot at your feet or move your feet when needing to change directions.   Assume that you can safely pick up even a paperclip without proper posture.  Document Released: 04/15/2005 Document Revised: 04/04/2011 Document Reviewed: 07/28/2008 [employer] Patient Information 2012 [employer] 

## 2011-11-19 ENCOUNTER — Other Ambulatory Visit: Payer: Self-pay | Admitting: *Deleted

## 2011-11-19 MED ORDER — METFORMIN HCL 850 MG PO TABS: 850.0000 mg | ORAL_TABLET | Freq: Two times a day (BID) | ORAL | Status: DC

## 2011-11-19 NOTE — Telephone Encounter (Signed)
Faxed refill request received from pharmacy for metformin Last seen on 06/06/11 Follow up 12/03/11 RX sent.

## 2011-11-29 ENCOUNTER — Telehealth: Payer: Self-pay | Admitting: Family Medicine

## 2011-11-29 NOTE — Telephone Encounter (Signed)
Wife notified vaccine available and pt will have to sign waiver accepting responsibility for payment if insurance does not cover.  They are agreeable.

## 2011-11-29 NOTE — Telephone Encounter (Signed)
Caller: Joann/Spouse; Phone Number: [phone]; Message from caller: Pt has an appt scheduled for next Tuesday 12/03/11.  They have checked with insurance company and Shingles vaccine is covered.  They want to know if vaccine will be available in office that day and if he can get it at visit.

## 2011-12-03 ENCOUNTER — Ambulatory Visit (INDEPENDENT_AMBULATORY_CARE_PROVIDER_SITE_OTHER): Payer: 59 | Admitting: Family Medicine

## 2011-12-03 ENCOUNTER — Encounter: Payer: Self-pay | Admitting: Family Medicine

## 2011-12-03 VITALS — BP 142/86 | HR 85 | Temp 97.4°F | Ht 68.25 in | Wt 283.0 lb

## 2011-12-03 DIAGNOSIS — K760 Fatty (change of) liver, not elsewhere classified: Secondary | ICD-10-CM | POA: Insufficient documentation

## 2011-12-03 DIAGNOSIS — Z2911 Encounter for prophylactic immunotherapy for respiratory syncytial virus (RSV): Secondary | ICD-10-CM

## 2011-12-03 DIAGNOSIS — I499 Cardiac arrhythmia, unspecified: Secondary | ICD-10-CM

## 2011-12-03 DIAGNOSIS — E119 Type 2 diabetes mellitus without complications: Secondary | ICD-10-CM

## 2011-12-03 DIAGNOSIS — I10 Essential (primary) hypertension: Secondary | ICD-10-CM

## 2011-12-03 DIAGNOSIS — K7689 Other specified diseases of liver: Secondary | ICD-10-CM

## 2011-12-03 DIAGNOSIS — E785 Hyperlipidemia, unspecified: Secondary | ICD-10-CM

## 2011-12-03 LAB — AST: AST: 59 U/L — ABNORMAL HIGH (ref 0–37)

## 2011-12-03 LAB — ALT: ALT: 64 U/L — ABNORMAL HIGH (ref 0–53)

## 2011-12-03 LAB — HEMOGLOBIN A1C: Hgb A1c MFr Bld: 6.9 % — ABNORMAL HIGH (ref 4.6–6.5)

## 2011-12-03 NOTE — Assessment & Plan Note (Signed)
W/ hx of mildly elevated transaminases. Will check AST and ALT today.

## 2011-12-03 NOTE — Assessment & Plan Note (Signed)
Problem stable.  Continue current medications and diet appropriate for this condition.  We have reviewed our general long term plan for this problem and also reviewed symptoms and signs that should prompt the patient to call or return to the office.  

## 2011-12-03 NOTE — Progress Notes (Signed)
OFFICE VISIT  12/03/2011   CC:  Chief Complaint  Patient presents with  . Follow-up    DM, HTN, hyperlipid  . Immunizations    Zostavax, has signed waiver     HPI:    Patient is a 66 y.o. Caucasian male who presents for f/u DM 2, HTN, hyperlipidemia, and hx of fatty liver with mild transaminase elevation.  He is due for HbA1c today. He checks his sugar "once in a while".  He says he can "feel" high after dietary indescretion. No home bp's to report--he has no home monitor. Saw his eye MD about 2 mo ago, was told he had some cataracts forming, had to have rx changed, no Diab retinopathy.     Past Medical History  Diagnosis Date  . Diabetes mellitus     type 2- non insulin-requiring  . Hyperlipidemia   . Hypertension     severe LVH on echo  . OSA (obstructive sleep apnea)     Could not tolerate CPAP, not surgical candidate per Dr. Suszanne Conners  . Morbid obesity   . Gout   . GERD (gastroesophageal reflux disease)     EDG 04-03-01, + distal esophageal stricture dilation  . Fatty liver u/s and CT 2009    w/mildly elevated transaminases  . Dyspnea     Cardiology w/u unrevealing except LVOT obstruction: suspect dyspnea due to morbid obesity and OSA    Past Surgical History  Procedure Date  . Colonoscopy x 2    Dr. Jarold Motto w/ [facility]  . Tee without cardioversion 05/2010    Dr. Jacinto Halim; severe LVH, no valvular abnormalities  . Cardiovascular stress test 2006    Normal stress nuclear study    Outpatient Prescriptions Prior to Visit  Medication Sig Dispense Refill  . allopurinol (ZYLOPRIM) 300 MG tablet Take 1 tablet (300 mg total) by mouth daily. For gout  30 tablet  5  . aspirin 325 MG tablet Take 325 mg by mouth daily.        Tery Sanfilippo Calcium (STOOL SOFTENER PO) Take 1 capsule by mouth at bedtime.      Marland Kitchen esomeprazole (NEXIUM) 40 MG capsule Take 1 capsule (40 mg total) by mouth daily.  30 capsule  10  . metFORMIN (GLUCOPHAGE) 850 MG tablet Take 1 tablet (850 mg total) by  mouth 2 (two) times daily with a meal.  30 tablet  0  . metoprolol tartrate (LOPRESSOR) 25 MG tablet Take 25 mg by mouth 2 (two) times daily. As per Dr. Jacinto Halim      . pravastatin (PRAVACHOL) 80 MG tablet Take 1 tablet (80 mg total) by mouth daily.  30 tablet  2  . temazepam (RESTORIL) 15 MG capsule Take 15 mg by mouth at bedtime.        Marland Kitchen lisinopril (PRINIVIL,ZESTRIL) 20 MG tablet Take 20 mg by mouth daily.          No Known Allergies  ROS As per HPI  PE: Blood pressure 142/86, pulse 85, temperature 97.4 F (36.3 C), temperature source Temporal, height 5' 8.25" (1.734 m), weight 283 lb (128.368 kg). Gen: Alert, well appearing, obese white male.  Patient is oriented to person, place, time, and situation. Neck: supple/nontender.  No LAD, mass, or TM.  Carotid pulses 2+ bilaterally, without bruits. CV: Rhythm irregularity, sounds most like regular rhythm with ectopy vs irregularly irregular rhythm but hard to tell for sure.  3/6 systolic murmur.  S1 and S2 ok. LUNGS: CTA bilat, nonlabored resps. EXT:  no clubbing, cyanosis, or edema.   LABS:  12 lead EKG today: sinus bradycardia, occasional PACs, LVH voltage criteria + inverted T waves laterally c/w LV strain/hypertrophy--no change from prior EKG.  IMPRESSION AND PLAN:  ESSENTIAL HYPERTENSION Problem stable.  Continue current medications and diet appropriate for this condition.  We have reviewed our general long term plan for this problem and also reviewed symptoms and signs that should prompt the patient to call or return to the office.   DIABETES MELLITUS, TYPE II, CONTROLLED, W/O COMPLICATIONS Noncompliant with monitoring, pretty noncompliant with diet and exercise. Due for HbA1c today.  He is o/w UTD on screening/monitoring.  Fatty liver W/ hx of mildly elevated transaminases. Will check AST and ALT today.  HYPERLIPIDEMIA Tolerating statin well. Lab Results  Component Value Date   CHOL 157 06/06/2011   HDL 40.00 06/06/2011    LDLCALC 88 06/06/2011   TRIG 144.0 06/06/2011   CHOLHDL 4 06/06/2011   Will recheck fasting lipid panel at next f/u in 6 months.   I told pt I would be glad to assume rx'ing responsibilities of his bp meds, as he hates to go to [place].  I did recommend he keep at least annual f/u with Dr. Jacinto Halim, though.  FOLLOW UP: Return in about 6 months (around 06/04/2012) for DM 2, HTN, fatty liver, .

## 2011-12-03 NOTE — Assessment & Plan Note (Signed)
Tolerating statin well. Lab Results  Component Value Date   CHOL 157 06/06/2011   HDL 40.00 06/06/2011   LDLCALC 88 06/06/2011   TRIG 144.0 06/06/2011   CHOLHDL 4 06/06/2011   Will recheck fasting lipid panel at next f/u in 6 months.

## 2011-12-03 NOTE — Assessment & Plan Note (Signed)
Noncompliant with monitoring, pretty noncompliant with diet and exercise. Due for HbA1c today.  He is o/w UTD on screening/monitoring.

## 2011-12-10 ENCOUNTER — Encounter: Payer: Self-pay | Admitting: Family Medicine

## 2011-12-24 ENCOUNTER — Other Ambulatory Visit: Payer: Self-pay | Admitting: *Deleted

## 2011-12-24 MED ORDER — METFORMIN HCL 850 MG PO TABS: 850.0000 mg | ORAL_TABLET | Freq: Two times a day (BID) | ORAL | Status: DC

## 2011-12-24 NOTE — Telephone Encounter (Signed)
Faxed refill request received from pharmacy for METFORMIN Last filled by MD on 11/19/11, #30 X 0 Last seen on 12/03/11 Follow up 06/09/11 RX sent

## 2012-01-02 ENCOUNTER — Other Ambulatory Visit: Payer: Self-pay | Admitting: *Deleted

## 2012-01-02 MED ORDER — PRAVASTATIN SODIUM 80 MG PO TABS: 80.0000 mg | ORAL_TABLET | Freq: Every day | ORAL | Status: DC

## 2012-01-02 NOTE — Telephone Encounter (Signed)
Faxed refill request received from pharmacy for PRAVASTATIN Last filled by MD on 10/02/11, #30 X 2 Last seen on 12/03/11 Follow up 06/08/12 RX SENT.

## 2012-01-15 ENCOUNTER — Encounter: Payer: Self-pay | Admitting: Family Medicine

## 2012-01-15 ENCOUNTER — Other Ambulatory Visit: Payer: Self-pay | Admitting: *Deleted

## 2012-01-15 DIAGNOSIS — E119 Type 2 diabetes mellitus without complications: Secondary | ICD-10-CM

## 2012-01-15 MED ORDER — GLUCOSE BLOOD VI STRP: ORAL_STRIP | Status: DC

## 2012-01-15 MED ORDER — ONETOUCH LANCETS MISC: Status: DC

## 2012-01-15 NOTE — Telephone Encounter (Signed)
Pt wife called requesting refill on OneTouch ultra lancets and strips.  These are called to pharmacy.

## 2012-01-21 ENCOUNTER — Ambulatory Visit (INDEPENDENT_AMBULATORY_CARE_PROVIDER_SITE_OTHER): Payer: 59 | Admitting: *Deleted

## 2012-01-21 DIAGNOSIS — Z23 Encounter for immunization: Secondary | ICD-10-CM

## 2012-01-21 NOTE — Progress Notes (Signed)
Pt presented for Pneumovax vaccine.  Reviewed with Dr. Abner Greenspan prior to injection.  Advised that guidelines are to be given age 65 or greater.  Pt will be moving to Medicare on 10/1 and wonders about coverage, Pt has called current insurance company and they say they will pay for injection.  Advised that if we give injection today guidelines state that we should offer/boost after age 38.  He is agreeable.  Injection is given.

## 2012-01-22 ENCOUNTER — Encounter: Payer: Self-pay | Admitting: Gastroenterology

## 2012-01-22 ENCOUNTER — Ambulatory Visit: Payer: 59

## 2012-01-23 ENCOUNTER — Encounter: Payer: Self-pay | Admitting: Family Medicine

## 2012-04-15 ENCOUNTER — Other Ambulatory Visit: Payer: Self-pay | Admitting: *Deleted

## 2012-04-15 MED ORDER — ALLOPURINOL 300 MG PO TABS: 300.0000 mg | ORAL_TABLET | Freq: Every day | ORAL | Status: DC

## 2012-05-06 ENCOUNTER — Encounter: Payer: Self-pay | Admitting: Family Medicine

## 2012-05-06 ENCOUNTER — Ambulatory Visit (INDEPENDENT_AMBULATORY_CARE_PROVIDER_SITE_OTHER): Payer: Medicare Other | Admitting: Family Medicine

## 2012-05-06 VITALS — BP 132/83 | HR 83 | Temp 98.5°F | Ht 68.25 in | Wt 287.0 lb

## 2012-05-06 DIAGNOSIS — J209 Acute bronchitis, unspecified: Secondary | ICD-10-CM

## 2012-05-06 DIAGNOSIS — R05 Cough: Secondary | ICD-10-CM | POA: Insufficient documentation

## 2012-05-06 MED ORDER — BUDESONIDE-FORMOTEROL FUMARATE 160-4.5 MCG/ACT IN AERO: 2.0000 | INHALATION_SPRAY | Freq: Two times a day (BID) | RESPIRATORY_TRACT | Status: DC

## 2012-05-06 MED ORDER — ALBUTEROL SULFATE HFA 108 (90 BASE) MCG/ACT IN AERS: 2.0000 | INHALATION_SPRAY | RESPIRATORY_TRACT | Status: DC | PRN

## 2012-05-06 MED ORDER — HYDROCODONE-HOMATROPINE 5-1.5 MG/5ML PO SYRP: ORAL_SOLUTION | ORAL | Status: DC

## 2012-05-06 NOTE — Assessment & Plan Note (Addendum)
No systemic steroid need at this time. Start symbicort 2 puffs bid. Start ventolin HFA 2 puffs q4h prn.  Hycodan 1-2 tsp q6h prn, #120 ml, no RF.  Therapeutic expectations and side effect profile of medication discussed today.  Patient's questions answered. Inhaler technique/teaching done today. F/u or call if not improving significantly in 5d.

## 2012-05-06 NOTE — Progress Notes (Signed)
OFFICE NOTE  05/06/2012  CC:  Chief Complaint  Patient presents with  . Cough    x 2 days, occ productive     HPI: Patient is a 67 y.o. Caucasian male who is here for cough.   Onset about 2-3 d/a of nasal congest, cough "feels like in chest", no known fever.  Nyquil, alka seltzer plus, and mucinex-type meds and other OTC meds don't help.  Nothing makes it worse.  +Feels wheezing. Quit smoking 14 yrs ago.  He got a flu shot this year and has also had pneumovax.  Head/facial fullness+.  No HA or ST.  NO SOB, no chest pain.  No body aches.  Pertinent PMH:  Past Medical History  Diagnosis Date  . Diabetes mellitus     type 2- non insulin-requiring  . Hyperlipidemia   . Hypertension     severe LVH on echo  . OSA (obstructive sleep apnea)     Could not tolerate CPAP, not surgical candidate per Dr. Suszanne Conners  . Morbid obesity   . Gout   . GERD (gastroesophageal reflux disease)     EDG 04-03-01, + distal esophageal stricture dilation  . Fatty liver u/s and CT 2009    w/mildly elevated transaminases  . Dyspnea     Cardiology w/u unrevealing except LVOT obstruction: suspect dyspnea due to morbid obesity and OSA  . Subvalvular aortic stenosis 2012    LVOT obstruction stable on echo 01/09/12  . Carotid bruit 05/28/10    Tortuosity of carotids on doppler u/s, no stenosis.    MEDS:  Outpatient Prescriptions Prior to Visit  Medication Sig Dispense Refill  . allopurinol (ZYLOPRIM) 300 MG tablet Take 1 tablet (300 mg total) by mouth daily. For gout  30 tablet  5  . aspirin 325 MG tablet Take 325 mg by mouth daily.        Tery Sanfilippo Calcium (STOOL SOFTENER PO) Take 1 capsule by mouth at bedtime.      Marland Kitchen esomeprazole (NEXIUM) 40 MG capsule Take 1 capsule (40 mg total) by mouth daily.  30 capsule  10  . glucose blood (ONE TOUCH ULTRA TEST) test strip Use as instructed to check blood sugar.  DX 250.00  100 each  prn  . lisinopril (PRINIVIL,ZESTRIL) 20 MG tablet Take 20 mg by mouth daily.        .  metFORMIN (GLUCOPHAGE) 850 MG tablet Take 1 tablet (850 mg total) by mouth 2 (two) times daily with a meal.  60 tablet  6  . metoprolol tartrate (LOPRESSOR) 25 MG tablet Take 25 mg by mouth 2 (two) times daily. As per Dr. Jacinto Halim      . ONE TOUCH LANCETS MISC Use as directed to check blood sugar.  DX 250.00  200 each  prn  . pravastatin (PRAVACHOL) 80 MG tablet Take 1 tablet (80 mg total) by mouth daily.  30 tablet  5  . temazepam (RESTORIL) 15 MG capsule Take 15 mg by mouth at bedtime.         Last reviewed on 05/06/2012  8:37 AM by Jeoffrey Massed, MD  PE: Blood pressure 132/83, pulse 83, temperature 98.5 F (36.9 C), temperature source Temporal, height 5' 8.25" (1.734 m), weight 287 lb (130.182 kg), SpO2 95.00%. VS: noted--normal. Gen: alert, NAD, NONTOXIC APPEARING. HEENT: eyes without injection, drainage, or swelling.  Ears: EACs clear, TMs with normal light reflex and landmarks.  Nose: Clear rhinorrhea, with some dried, crusty exudate adherent to mildly injected mucosa.  No purulent d/c.  No paranasal sinus TTP.  No facial swelling.  Throat and mouth without focal lesion.  No pharyngial swelling, erythema, or exudate.   Neck: supple, no LAD.   LUNGS: Clear on inspiration but some coarse wheezing diffusely on exhalation that does not entirely clear with cough, also lots of post-exhalation coughing, nonlabored resps.   CV: RRR, 3/6 blowing systolic murmur.  No rub or gallop. EXT: no c/c/e SKIN: no rash    IMPRESSION AND PLAN:  Acute bronchitis No systemic steroid need at this time. Start symbicort 2 puffs bid. Start ventolin HFA 2 puffs q4h prn.  Hycodan 1-2 tsp q6h prn, #120 ml, no RF.  Therapeutic expectations and side effect profile of medication discussed today.  Patient's questions answered. Inhaler technique/teaching done today. F/u or call if not improving significantly in 5d.  An After Visit Summary was printed and given to the patient.  FOLLOW UP: prn

## 2012-05-11 ENCOUNTER — Telehealth: Payer: Self-pay | Admitting: Family Medicine

## 2012-05-11 MED ORDER — AZITHROMYCIN 250 MG PO TABS: ORAL_TABLET | ORAL | Status: DC

## 2012-05-11 MED ORDER — PREDNISONE 20 MG PO TABS: ORAL_TABLET | ORAL | Status: DC

## 2012-05-11 NOTE — Telephone Encounter (Signed)
I sent eRx for azithromycin and prednisone to his pharmacy. Encourage pt to return for office follow up if he does not begin to improve in 2-3 days OR if worse prior to then.--thx

## 2012-05-11 NOTE — Telephone Encounter (Signed)
I need to know what specific symptoms are not any better, then I'll decide what the appropriate next step is.-thx

## 2012-05-11 NOTE — Telephone Encounter (Signed)
Patient called back. His symptoms are as follows; coughing green mucus & sweating with a fever. Please advise

## 2012-05-11 NOTE — Telephone Encounter (Signed)
Pt notified RX's have been sent and was given instruction multiple times to have office visit if no better or worse.  Pt voiced understanding.

## 2012-05-11 NOTE — Telephone Encounter (Signed)
Pt seen 05/06/12 with acute bronchitis.  No better according to note.  Wants ABX.  Please advise.

## 2012-05-11 NOTE — Telephone Encounter (Signed)
Patient Information:  Caller Name: Chyrl Civatte  Phone: [phone]  Patient: Jack [name], Jack [name]  Gender: Male  DOB: 04-Jan-1946  Age: 67 Years  PCP: Earley Favor [facility])  Office Follow Up:  Does the office need to follow up with this patient?: Yes  Instructions For The Office: OFFICE PLEASE FOLLOW UP WITH PATIENT IF ANTIBIOTIC CAN BE CALLED IN.  PT USES [facility] IN [place] [phone]   Symptoms  Reason For Call & Symptoms: pt was seen in the office on 05/05/12 for bronchitis; pt not improving.  Reviewed Health History In EMR: Yes  Reviewed Medications In EMR: Yes  Reviewed Allergies In EMR: Yes  Reviewed Surgeries / Procedures: Yes  Date of Onset of Symptoms: 05/05/2012  Treatments Tried: inhalers and cough medication prescribed  Treatments Tried Worked: No  Guideline(s) Used:  Cough  Disposition Per Guideline:   See Today or Tomorrow in Office  Reason For Disposition Reached:   Continuous (nonstop) coughing interferes with work or school and no improvement using cough treatment per Care Advice  Advice Given:  N/A  Patient Refused Recommendation:  Patient Requests Prescription  pt states he was just in the office on 05/06/12 for same symptoms and he is not improving.  Pt wants to know about an antibiotic being prescribed

## 2012-05-18 ENCOUNTER — Telehealth: Payer: Self-pay | Admitting: Family Medicine

## 2012-05-18 ENCOUNTER — Ambulatory Visit (INDEPENDENT_AMBULATORY_CARE_PROVIDER_SITE_OTHER): Payer: Medicare Other | Admitting: Family Medicine

## 2012-05-18 ENCOUNTER — Ambulatory Visit (INDEPENDENT_AMBULATORY_CARE_PROVIDER_SITE_OTHER)
Admission: RE | Admit: 2012-05-18 | Discharge: 2012-05-18 | Disposition: A | Discharge status: Home / Self Care | Payer: Medicare Other | Source: Ambulatory Visit | Attending: Family Medicine | Admitting: Family Medicine

## 2012-05-18 ENCOUNTER — Encounter: Payer: Self-pay | Admitting: Family Medicine

## 2012-05-18 VITALS — BP 158/91 | HR 112 | Ht 68.25 in | Wt 282.0 lb

## 2012-05-18 DIAGNOSIS — J209 Acute bronchitis, unspecified: Secondary | ICD-10-CM

## 2012-05-18 NOTE — Telephone Encounter (Signed)
Patient Information:  Caller Name: [place]  Phone: [phone]  Patient: Jack [name], Jack [name]  Gender: Male  DOB: 08/31/1945  Age: 67 Years  PCP: Earley Favor [facility])  Office Follow Up:  Does the office need to follow up with this patient?: No  Instructions For The Office: N/A   Symptoms  Reason For Call & Symptoms: Has completed antibiotics for upper respiratory infection. Still coughing up green mucus at this time. Reports severe coughing spells.  Reviewed Health History In EMR: Yes  Reviewed Medications In EMR: Yes  Reviewed Allergies In EMR: Yes  Reviewed Surgeries / Procedures: Yes  Date of Onset of Symptoms: 05/04/2012  Treatments Tried: Prednisone, Zithromax  Treatments Tried Worked: No  Guideline(s) Used:  Cough  Disposition Per Guideline:   See Today in Office  Reason For Disposition Reached:   Severe coughing spells (e.g., whooping sound after coughing, vomiting after coughing)  Advice Given:  Call Back If:  Difficulty breathing  You become worse.  Appointment Scheduled:  05/18/2012 11:15:00 Appointment Scheduled Provider:  Earley Favor [facility])

## 2012-05-18 NOTE — Assessment & Plan Note (Signed)
Lingering sx's. Add mucinex DM. Continue symbicort 2 puffs bid. Check CXR today to make sure no subtle infiltrate or sign of pulm edema, etc.

## 2012-05-18 NOTE — Telephone Encounter (Signed)
Pt arrived for appt.

## 2012-05-18 NOTE — Progress Notes (Signed)
OFFICE NOTE  05/18/2012  CC:  Chief Complaint  Patient presents with  . Cough    completed steroid and antibiotic and still not feeling well     HPI: Patient is a 67 y.o. Caucasian male who is here for 2 wk f/u acute bronchitis. He recently completed a course of prednisone and a Z-pack.   Says he feels like he still has significant coughing/congestion in chest, but says wheezing is gone.  No fevers, no loss of appetite.  No change in his LE edema. Taking cough drops only.  No CP, no SOB.  Pertinent PMH:  Past Medical History  Diagnosis Date  . Diabetes mellitus     type 2- non insulin-requiring  . Hyperlipidemia   . Hypertension     severe LVH on echo  . OSA (obstructive sleep apnea)     Could not tolerate CPAP, not surgical candidate per Dr. Suszanne Conners  . Morbid obesity   . Gout   . GERD (gastroesophageal reflux disease)     EDG 04-03-01, + distal esophageal stricture dilation  . Fatty liver u/s and CT 2009    w/mildly elevated transaminases  . Dyspnea     Cardiology w/u unrevealing except LVOT obstruction: suspect dyspnea due to morbid obesity and OSA  . Subvalvular aortic stenosis 2012    LVOT obstruction stable on echo 01/09/12  . Carotid bruit 05/28/10    Tortuosity of carotids on doppler u/s, no stenosis.   Past surgical, social, and family history reviewed and no changes noted since last office visit.  MEDS:  Outpatient Prescriptions Prior to Visit  Medication Sig Dispense Refill  . albuterol (VENTOLIN HFA) 108 (90 BASE) MCG/ACT inhaler Inhale 2 puffs into the lungs every 4 (four) hours as needed for wheezing.  1 Inhaler  0  . allopurinol (ZYLOPRIM) 300 MG tablet Take 1 tablet (300 mg total) by mouth daily. For gout  30 tablet  5  . aspirin 325 MG tablet Take 325 mg by mouth daily.        . budesonide-formoterol (SYMBICORT) 160-4.5 MCG/ACT inhaler Inhale 2 puffs into the lungs 2 (two) times daily.  1 Inhaler  0  . Docusate Calcium (STOOL SOFTENER PO) Take 1 capsule by  mouth at bedtime.      Marland Kitchen esomeprazole (NEXIUM) 40 MG capsule Take 1 capsule (40 mg total) by mouth daily.  30 capsule  10  . glucose blood (ONE TOUCH ULTRA TEST) test strip Use as instructed to check blood sugar.  DX 250.00  100 each  prn  . metFORMIN (GLUCOPHAGE) 850 MG tablet Take 1 tablet (850 mg total) by mouth 2 (two) times daily with a meal.  60 tablet  6  . metoprolol tartrate (LOPRESSOR) 25 MG tablet Take 25 mg by mouth 2 (two) times daily. As per Dr. Jacinto Halim      . ONE TOUCH LANCETS MISC Use as directed to check blood sugar.  DX 250.00  200 each  prn  . pravastatin (PRAVACHOL) 80 MG tablet Take 1 tablet (80 mg total) by mouth daily.  30 tablet  5  . HYDROcodone-homatropine (HYCODAN) 5-1.5 MG/5ML syrup 1-2 tsp po q6h prn cough  120 mL  0  . temazepam (RESTORIL) 15 MG capsule Take 15 mg by mouth at bedtime.        . [DISCONTINUED] azithromycin (ZITHROMAX) 250 MG tablet 2 tabs po qd x 1d, then 1 tab po qd x 4d  6 each  0  . [DISCONTINUED] lisinopril (PRINIVIL,ZESTRIL) 20  MG tablet Take 20 mg by mouth daily.        . [DISCONTINUED] predniSONE (DELTASONE) 20 MG tablet 2 tabs po qd x 5d  10 tablet  0   Last reviewed on 05/18/2012 11:44 AM by Jeoffrey Massed, MD  PE: Blood pressure 158/91, pulse 112, height 5' 8.25" (1.734 m), weight 282 lb (127.914 kg). Gen: alert, NAD, WELL-APPEARING. HEENT: eyes without injection, drainage, or swelling.  Ears: EACs clear, TMs with normal light reflex and landmarks.  Nose: Mild nasal mucosal edema, no discharge noted.  No paranasal sinus TTP.  No facial swelling.  Throat and mouth without focal lesion.  No pharyngial swelling, erythema, or exudate.   Neck: supple, no LAD.   LUNGS: CTA bilat, nonlabored resps.  Some occasional post-exhalation coughing is noted. CV: RRR, no m/r/g. EXT: 2+ pitting edema bilat, R>L SKIN: no rash   IMPRESSION AND PLAN:  Acute bronchitis Lingering sx's. Add mucinex DM. Continue symbicort 2 puffs bid. Check CXR today to  make sure no subtle infiltrate or sign of pulm edema, etc.  He looks good in office today.  FOLLOW UP: prn

## 2012-05-25 ENCOUNTER — Other Ambulatory Visit: Payer: Self-pay | Admitting: Family Medicine

## 2012-05-25 ENCOUNTER — Telehealth: Payer: Self-pay | Admitting: Family Medicine

## 2012-05-25 DIAGNOSIS — J209 Acute bronchitis, unspecified: Secondary | ICD-10-CM

## 2012-05-25 NOTE — Telephone Encounter (Signed)
Patient is still having respiratory issues. Can he get a referral to go to [facility]? His first preference is [facility] if they see patients there, 2nd is [facility] location.

## 2012-05-25 NOTE — Telephone Encounter (Signed)
Will make referral order as per his request.

## 2012-05-25 NOTE — Telephone Encounter (Signed)
Jack [name] notified referral ordered and she will get a call back within a couple of days to schedule.

## 2012-05-25 NOTE — Telephone Encounter (Signed)
Seen on 1/8 and 1/20 for bronchitis.  Would like to see pulmonary.  Please advise.

## 2012-05-27 ENCOUNTER — Institutional Professional Consult (permissible substitution): Payer: Medicare Other | Admitting: Pulmonary Disease

## 2012-05-27 ENCOUNTER — Encounter: Payer: Self-pay | Admitting: Pulmonary Disease

## 2012-05-27 ENCOUNTER — Ambulatory Visit (INDEPENDENT_AMBULATORY_CARE_PROVIDER_SITE_OTHER): Payer: Medicare Other | Admitting: Pulmonary Disease

## 2012-05-27 VITALS — BP 118/64 | HR 68 | Temp 98.2°F | Ht 69.0 in | Wt 283.4 lb

## 2012-05-27 DIAGNOSIS — R05 Cough: Secondary | ICD-10-CM

## 2012-05-27 MED ORDER — DEXTROMETHORPHAN POLISTIREX 30 MG/5ML PO LQCR: 60.0000 mg | Freq: Every day | ORAL | Status: DC | PRN

## 2012-05-27 MED ORDER — FLUTICASONE PROPIONATE 50 MCG/ACT NA SUSP: 2.0000 | Freq: Every day | NASAL | Status: DC

## 2012-05-27 MED ORDER — PHENYLEPH-DOXYLAMINE-DM-APAP 5-6.25-10-325 MG/15ML PO LIQD: 30.0000 mL | Freq: Every evening | ORAL | Status: DC | PRN
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
## 2012-05-27 NOTE — Progress Notes (Deleted)
  Subjective:    Patient ID: Jack [name], male    DOB: Dec 09, 1945, 67 y.o.   MRN: [id number]  HPI    Review of Systems  Constitutional: Negative for appetite change and unexpected weight change.  HENT: Positive for congestion and postnasal drip. Negative for ear pain, sore throat, trouble swallowing and dental problem.   Respiratory: Positive for cough and shortness of breath.   Cardiovascular: Negative for chest pain, palpitations and leg swelling.  Gastrointestinal: Negative for abdominal pain.  Musculoskeletal: Negative for joint swelling.  Skin: Negative for rash.  Neurological: Positive for headaches.  Psychiatric/Behavioral: Negative for dysphoric mood. The patient is not nervous/anxious.        Objective:   Physical Exam        Assessment & Plan:

## 2012-05-27 NOTE — Patient Instructions (Signed)
Neti pot (saline nasal spray) daily Flonase two sprays each nostril daily Salt water gargle as needed Sip water when you have urge to cough Use sugarless candy to keep mouth moist Ventolin two puffs as needed for cough, wheeze, or chest congestion Stop symbicort Follow up in 3 to 4 weeks

## 2012-05-27 NOTE — Assessment & Plan Note (Signed)
He has persistent cough after recent respiratory infection (likely viral).  His recent chest xray was relatively unremarkable.  He has been tried on aggressive inhaler regimen for reactive airway disease/asthma without much benefit.  He doesn't really have symptoms prior to this episode to suggest asthma or obstructive lung disease.  He does have prominent sinus symptoms with post-nasal drip.  I think his cough is mostly related to post-nasal drip.  This is then likely triggering cyclic cough with upper airway irritation.  Will have him use nasal irrigation and flonase on daily basis until next visit.  He has history of reflux, but this appears to be well controlled with nexium and does not seem to be a prominent issue at present.  I will have him stop symbicort.  He can continue ventolin as needed.  Advised him to use sugarless candy to maintain salivary flow, sip water with urge to cough, and use salt water gargles.  I have advised him to avoid cough drops.  He can use delsym as needed during day, and delsym multi-symptom nighttime relief before bedtime as needed.  Explained to him that cough relief with this regimen can take several weeks.  If there is no improvement by next visit, then will assess need for additional testing.

## 2012-06-05 ENCOUNTER — Institutional Professional Consult (permissible substitution): Payer: Medicare Other | Admitting: Emergency Medicine

## 2012-06-08 ENCOUNTER — Ambulatory Visit (INDEPENDENT_AMBULATORY_CARE_PROVIDER_SITE_OTHER): Payer: Medicare Other | Admitting: Family Medicine

## 2012-06-08 ENCOUNTER — Encounter: Payer: Self-pay | Admitting: Family Medicine

## 2012-06-08 VITALS — BP 130/78 | HR 76 | Ht 68.25 in | Wt 283.0 lb

## 2012-06-08 DIAGNOSIS — Z125 Encounter for screening for malignant neoplasm of prostate: Secondary | ICD-10-CM

## 2012-06-08 DIAGNOSIS — Z0000 Encounter for general adult medical examination without abnormal findings: Secondary | ICD-10-CM

## 2012-06-08 DIAGNOSIS — Z23 Encounter for immunization: Secondary | ICD-10-CM

## 2012-06-08 DIAGNOSIS — I10 Essential (primary) hypertension: Secondary | ICD-10-CM

## 2012-06-08 DIAGNOSIS — E119 Type 2 diabetes mellitus without complications: Secondary | ICD-10-CM

## 2012-06-08 DIAGNOSIS — R7401 Elevation of levels of liver transaminase levels: Secondary | ICD-10-CM

## 2012-06-08 LAB — PSA, MEDICARE: PSA: 0.73 ng/ml (ref 0.10–4.00)

## 2012-06-08 NOTE — Progress Notes (Signed)
  WELCOME TO MEDICARE (IPPE) VISIT I explained that today's visit was for the purpose of health promotion and disease detection, as well as an introduction to Medicare and it's covered benefits.  I explained that no labs or other services would be performed today, but if any were determined to be necessary then appropriate orders/referrals would be arranged for these to be done at a future date.  Patient is a 67 y.o. Caucasian Male who is already an established patient with me.  Pt's medical and social history were reviewed. Specifically, we reviewed PMH/PSH/Meds/FH.  Also reviewed alcohol, tobacco, and illicit drug use.  Diet and physical activity reviewed.  Pt is a former smoker, occasionally drinks alcohol, and has no history of illicit drug use or prescription drug abuse. All of this info is also found in the appropriate sections of pt's EMR.  Pt was screened with appropriate screening instrument for depression.  Current or past experiences with mood disorders was discussed.  No depression or other mood disorder.  Pt's functional ability and level of safety were reviewed. Specifically, I screened for hearing impairment and fall risk.  I assessed home safety and we discussed pt's competency with activities of daily living.  He has no hearing impairment.  His home is described as safe he has no significant fall risk.  He is fully independent with all ADL's.  EXAMCeasar Mons Vitals:   06/08/12 0946  BP: 130/78  Pulse: 76   Body mass index is 42.69 kg/(m^2). Visual acuity screen: Pt gets routine eye care through optometrist/ophthalmologist and is up to date with follow up care with this provider. No additional physical exam required or indicated today.  End of life planning: Advanced directives and power of attorney information specific to the patient were discussed.  Patient has a written document designating a health care power of attorney and advanced directives.  He stated he would provide a  copy of this for our chart ASAP.  Education, counseling, and referrals based on the information obtained/reviewed today: None.  Education, counseling, and referral for other preventive services: Written checklist was completed and given to pt for obtaining, as appropriate, the other preventive services that are covered as separate Medicare Part B benefits. Possible services that were reviewed with pt are:  -annual wellness visit (AWV) -Bone mass measurements -Cardiovascular screening blood tests -Colorectal cancer screening -Counseling to prevent tobacco use -Diabetes screening tests -Diabetes self-management training (DSMT) -Glaucoma screening -HIV screening -Medical nutrition therapy -Prostate cancer screening -Seasonal influenza, pneumococcal, and Hep B vaccines -screening mammography -screening pap tests and pelvic exam -ultrasound screening for AAA  Of the above listed services, no referrals were made today.  Patient did not have an additional complaint/problem that was discussed and evaluated today.  Patient was given opportunity to ask any additional questions regarding Medicare and covered benefits.  Patient was informed that Medicare does not provide coverage for routine physical exams.  I answered all questions to the best of my ability today.    Follow up: Return in about 6 months (around 12/06/2012) for f/u DM 2, HTN, hyperlipidemia (lab visit the week prior).   ADDENDUM: The following services were performed today but due to medicare guidelines for the AWV, I could not charge patient for providing this care as part of today's visit (I realized this after the visit): prostate cancer screening (DRE was normal, PSA obtained) and Hep B vaccine #1.  He will return in 1 mo for Hep B #2.--PM

## 2012-06-09 NOTE — Progress Notes (Signed)
Pt informed

## 2012-06-23 ENCOUNTER — Ambulatory Visit: Payer: Medicare Other | Admitting: Pulmonary Disease

## 2012-07-10 ENCOUNTER — Ambulatory Visit (INDEPENDENT_AMBULATORY_CARE_PROVIDER_SITE_OTHER): Payer: Medicare Other | Admitting: *Deleted

## 2012-08-28 ENCOUNTER — Encounter: Payer: Self-pay | Admitting: Family Medicine

## 2012-09-22 ENCOUNTER — Other Ambulatory Visit: Payer: Self-pay | Admitting: *Deleted

## 2012-09-22 MED ORDER — ESOMEPRAZOLE MAGNESIUM 40 MG PO CPDR: 40.0000 mg | DELAYED_RELEASE_CAPSULE | Freq: Every day | ORAL | Status: DC

## 2012-09-22 NOTE — Telephone Encounter (Signed)
Rx request to pharmacy/[facility]  

## 2012-10-07 ENCOUNTER — Ambulatory Visit (INDEPENDENT_AMBULATORY_CARE_PROVIDER_SITE_OTHER): Payer: Medicare Other | Admitting: Pulmonary Disease

## 2012-10-07 ENCOUNTER — Encounter: Payer: Self-pay | Admitting: Pulmonary Disease

## 2012-10-07 ENCOUNTER — Ambulatory Visit (INDEPENDENT_AMBULATORY_CARE_PROVIDER_SITE_OTHER)
Admission: RE | Admit: 2012-10-07 | Discharge: 2012-10-07 | Disposition: A | Discharge status: Home / Self Care | Payer: Medicare Other | Source: Ambulatory Visit | Attending: Pulmonary Disease | Admitting: Pulmonary Disease

## 2012-10-07 VITALS — BP 134/82 | HR 82 | Temp 97.7°F | Ht 69.0 in | Wt 284.0 lb

## 2012-10-07 DIAGNOSIS — R042 Hemoptysis: Secondary | ICD-10-CM

## 2012-10-07 DIAGNOSIS — R05 Cough: Secondary | ICD-10-CM

## 2012-10-07 MED ORDER — AZITHROMYCIN 250 MG PO TABS: ORAL_TABLET | ORAL | Status: DC

## 2012-10-07 NOTE — Assessment & Plan Note (Signed)
He was doing better until this past week.  He has an acute bronchitis and episode of hemoptysis.  Will give him course of zithromax and check chest xray.  He can continue prn ventolin, and continue nasal irrigation and flonase.  He is to call if he is not doing better.  I don't think he needs prednisone or maintenance inhalers at this time.

## 2012-10-07 NOTE — Progress Notes (Signed)
Chief Complaint  Patient presents with  . Follow-up    Reports that he feels his cough his slightly improved. States that he does have PND and has coughed up some blood.    History of Present Illness: Jack [name] is a 67 y.o. male former smoker with chronic cough.  He was doing much better with his cough until last week.  He went to [employer], and when he returned he started getting a cough again.  He has been bringing up creamy yellow sputum.  He had blood in his sputum two days ago, but not since.  He denies fever, but has been getting chills and sweats.  He has noticed some sinus pressure, and scratchy throat.  He is using nasal irrigation and flonase and these help.  He has not been using ventolin.  TESTS: Echo 01/09/12 >> severe LVH, EF 56%, mod LA dilation, LVOT obstruction  Jack [name]  has a past medical history of Diabetes mellitus; Hyperlipidemia; Hypertension; OSA (obstructive sleep apnea); Morbid obesity; Gout; GERD (gastroesophageal reflux disease); Fatty liver (u/s and CT 2009); Dyspnea; Subvalvular aortic stenosis (2012); and Carotid bruit (05/28/10).  Jack [name]  has past surgical history that includes Colonoscopy (x 2); TEE without cardioversion (05/2010); Cardiovascular stress test (2006 and 04/27/10); transthoracic echocardiogram (05/28/10); transesophageal echocardiogram (06/06/10); and Nasal sinus surgery.  Prior to Admission medications   Medication Sig Start Date End Date Taking? Authorizing Provider  albuterol (VENTOLIN HFA) 108 (90 BASE) MCG/ACT inhaler Inhale 2 puffs into the lungs every 4 (four) hours as needed for wheezing. 05/06/12  Yes Jeoffrey Massed, MD  allopurinol (ZYLOPRIM) 300 MG tablet Take 1 tablet (300 mg total) by mouth daily. For gout 04/15/12  Yes Jeoffrey Massed, MD  aspirin 325 MG tablet Take 325 mg by mouth daily.     Yes Historical Provider, MD  Docusate Calcium (STOOL SOFTENER PO) Take 1 capsule by mouth at bedtime.   Yes Historical Provider,  MD  esomeprazole (NEXIUM) 40 MG capsule Take 1 capsule (40 mg total) by mouth daily. 09/22/12  Yes Jeoffrey Massed, MD  fluticasone (FLONASE) 50 MCG/ACT nasal spray Place 2 sprays into the nose daily. 05/27/12  Yes Coralyn Helling, MD  furosemide (LASIX) 20 MG tablet Take 1 tablet by mouth Once daily as needed. 02/11/12  Yes Pamella Pert, MD  glucose blood (ONE TOUCH ULTRA TEST) test strip Use as instructed to check blood sugar.  DX 250.00 01/15/12  Yes Jeoffrey Massed, MD  metFORMIN (GLUCOPHAGE) 850 MG tablet Take 1 tablet (850 mg total) by mouth 2 (two) times daily with a meal. 12/24/11 12/23/12 Yes Jeoffrey Massed, MD  metoprolol (LOPRESSOR) 50 MG tablet Take 50 mg by mouth 2 (two) times daily. As per Dr. Jacinto Halim   Yes Historical Provider, MD  ONE [facility] LANCETS MISC Use as directed to check blood sugar.  DX 250.00 01/15/12  Yes Jeoffrey Massed, MD  potassium chloride SA (K-DUR,KLOR-CON) 20 MEQ tablet Take 1 tablet by mouth Once daily as needed. 02/11/12  Yes Pamella Pert, MD  pravastatin (PRAVACHOL) 80 MG tablet Take 1 tablet (80 mg total) by mouth daily. 01/02/12 01/01/13 Yes Jeoffrey Massed, MD  temazepam (RESTORIL) 15 MG capsule Take 15 mg by mouth as needed.    Yes Historical Provider, MD  verapamil (CALAN-SR) 120 MG CR tablet Take 1 tablet by mouth Daily. 03/03/12  Yes Pamella Pert, MD    No Known Allergies   Physical  Exam:  General - No distress ENT - No sinus tenderness, no oral exudate, no LAN Cardiac - s1s2 regular, 2/6 SM Chest - coarse breath sounds clear with cough, no wheeze Back - No focal tenderness Abd - Soft, non-tender Ext - No edema Neuro - Normal strength Skin - No rashes Psych - normal mood, and behavior   Assessment/Plan:  Coralyn Helling, MD  Pulmonary/Critical Care/Sleep Pager:  [phone]

## 2012-10-07 NOTE — Patient Instructions (Signed)
Zithromax 250 mg pill >> 2 pills of day 1, then 1 pill daily Ventolin two puffs as needed for cough, wheeze, or chest congestion Continue neti pot nad flonase Chest xray today >> will call with results Call if not feeling better  Follow up in 6 months

## 2012-10-08 ENCOUNTER — Telehealth: Payer: Self-pay | Admitting: Pulmonary Disease

## 2012-10-08 MED ORDER — ALBUTEROL SULFATE HFA 108 (90 BASE) MCG/ACT IN AERS: 2.0000 | INHALATION_SPRAY | RESPIRATORY_TRACT | Status: DC | PRN

## 2012-10-08 NOTE — Telephone Encounter (Signed)
Pt's wife is aware of results. Nothing further was needed. 

## 2012-10-08 NOTE — Telephone Encounter (Signed)
Dg Chest 2 View  10/07/2012   *RADIOLOGY REPORT*  Clinical Data: Chronic cough.  CHEST - 2 VIEW  Comparison: 05/18/2012  Findings: Heart size is normal.  No effusion or edema.  No airspace consolidation identified.  There is mild spondylosis within the thoracic spine.  IMPRESSION:  1.  No acute cardiopulmonary abnormalities.   Original Report Authenticated By: Signa Kell, [name]    Will have my nurse inform patient that chest xray was normal.  No change to treatment plan detailed from 10/07/12.

## 2012-10-12 ENCOUNTER — Telehealth: Payer: Self-pay | Admitting: Pulmonary Disease

## 2012-10-12 MED ORDER — PREDNISONE 10 MG PO TABS: ORAL_TABLET | ORAL | Status: DC

## 2012-10-12 NOTE — Telephone Encounter (Signed)
Please explain that he does not need additional antibiotics, but will need course of prednisone.  Please send script for prednisone 10 mg pill >> 4 pills for 2 days, 3 pills for 2 days, 2 pills for 2 days, 1 pill for 2 days.  Dispense 20 pills with no refills.  He should call back if he is not feeling better after course of prednisone.

## 2012-10-12 NOTE — Telephone Encounter (Signed)
I spoke with spouse. She stated Jack [name] is coughing up yellow phlem, having drainage and congestion, wheezing, chest tx. Jack [name] was in to see Dr. Craige Cotta on 10/07/12. He finished the ZPAK. Still using the flonase and the netti pot. Requesting further recs. Please advise Dr. Craige Cotta thanks  No Known Allergies

## 2012-10-12 NOTE — Telephone Encounter (Signed)
I spoke with spouse and is aware of VS recs. RX called in. Nothing further was needed

## 2012-10-29 ENCOUNTER — Encounter: Payer: Self-pay | Admitting: Family Medicine

## 2012-10-29 ENCOUNTER — Ambulatory Visit (INDEPENDENT_AMBULATORY_CARE_PROVIDER_SITE_OTHER): Payer: Medicare Other | Admitting: Family Medicine

## 2012-10-29 VITALS — BP 136/82 | HR 80 | Temp 98.2°F | Resp 18 | Ht 68.0 in | Wt 279.0 lb

## 2012-10-29 DIAGNOSIS — R6889 Other general symptoms and signs: Secondary | ICD-10-CM | POA: Insufficient documentation

## 2012-10-29 DIAGNOSIS — R05 Cough: Secondary | ICD-10-CM

## 2012-10-29 DIAGNOSIS — J387 Other diseases of larynx: Secondary | ICD-10-CM

## 2012-10-29 DIAGNOSIS — K219 Gastro-esophageal reflux disease without esophagitis: Secondary | ICD-10-CM

## 2012-10-29 DIAGNOSIS — T679XXA Effect of heat and light, unspecified, initial encounter: Secondary | ICD-10-CM

## 2012-10-29 DIAGNOSIS — I10 Essential (primary) hypertension: Secondary | ICD-10-CM

## 2012-10-29 DIAGNOSIS — E119 Type 2 diabetes mellitus without complications: Secondary | ICD-10-CM

## 2012-10-29 LAB — COMPREHENSIVE METABOLIC PANEL
Alkaline Phosphatase: 83 U/L (ref 39–117)
Glucose, Bld: 242 mg/dL — ABNORMAL HIGH (ref 70–99)
Sodium: 137 mEq/L (ref 135–145)
Total Bilirubin: 0.8 mg/dL (ref 0.3–1.2)
Total Protein: 7.3 g/dL (ref 6.0–8.3)

## 2012-10-29 MED ORDER — ESOMEPRAZOLE MAGNESIUM 40 MG PO CPDR: DELAYED_RELEASE_CAPSULE | ORAL | Status: DC

## 2012-10-29 NOTE — Assessment & Plan Note (Signed)
I think his normal "hot nature" is worsened by all the coughing he is doing lately. He has no other sx's to support hyperthyroidism, but last TSH was borderline low (0.37 on 12/14/10) so I'll repeat this test today to r/o hyperthyroidism.

## 2012-10-29 NOTE — Assessment & Plan Note (Signed)
Overdue for routine f/u. We did not discuss this much today at all, but will do HbA1c today.

## 2012-10-29 NOTE — Assessment & Plan Note (Signed)
Most consistent with laryngopharyngeal reflux. Increase nexium to 40mg  bid. Encouraged pt to follow GERD diet but he did not sound inclined to do this.  He did agree to stop propping himself up on several pillows and to raise the head of his bed a few inches instead. ENT referral order placed today for further eval/management/confirmation. D/C symbicort--he has not improved with this med, and I'm concerned the inhaled steroid may not be helping the pharynx/hypopharynx in regard to this cough.  He does not seem to be on an ACE-I anymore, but I asked him to have his wife call our office later to clarify his current med list.

## 2012-10-29 NOTE — Progress Notes (Signed)
OFFICE NOTE  10/29/2012  CC:  Chief Complaint  Patient presents with  . Cough     HPI: Patient is a 67 y.o. Caucasian male who is here for cough. Reports approx 2 mo history of frequent feeling of tickle in back of throat and this induces a nonproductive cough.  Cough is worse when lying flat on his back, therefore he sleeps propped up on several pillows.  He feels frequent need to clear phlegm from his throat but denies any feeling of nasal congestion or mucous or post-nasal drip.  He does not notice if his cough is worse after eating or not.  He frequently wakes up with a scratchy, mildly sore throat. Denies wheezing, SOB, chest pain, or hemoptysis.  No known fevers/chills or wt loss.  No PND. He has seen his pulmonologist, Dr. Craige Cotta, on two occasions over the duration of his symptoms and he was given a Z-pack and oral steroid burst at one point---this made no difference.  He was also started on symbicort 2 puffs bid and patient notes no improvement in cough with this med either.   Reports long history of being "hot natured" but says the last couple of months this has been worse than ever--sweats a lot more even in cool environments.  He has not checked his temperature at any time.   He has a history of being treated for HTN with an ACE-I (lisinopril) but pt reports being taken off of this about a year ago due to bp being too low.   He denies feeling any significant classic GER sx's when he takes his nexium daily like he is currently doing.  He does not eat a healthy diet--he admits this and he gives no indication that he plans on changing this significantly in the future. We reviewed his med list in EMR and on a piece of paper he carries in his wallet (dated 12/2011) and there seemed to be some discrepancies so he said he will have his wife call back later with a list of what he takes b/c "she's in charge of that".  Pertinent PMH:  Past Medical History  Diagnosis Date  . Diabetes mellitus     type 2- non insulin-requiring  . Hyperlipidemia   . Hypertension     severe LVH on echo  . OSA (obstructive sleep apnea)     Could not tolerate CPAP, not surgical candidate per Dr. Suszanne Conners  . Morbid obesity   . Gout   . GERD (gastroesophageal reflux disease)     EDG 04-03-01, + distal esophageal stricture dilation  . Fatty liver u/s and CT 2009    w/mildly elevated transaminases  . Dyspnea     Cardiology w/u unrevealing except LVOT obstruction: suspect dyspnea due to morbid obesity and OSA  . Subvalvular aortic stenosis 2012    LVOT obstruction stable on echo 01/09/12--without evidence of HOCM.  Marland Kitchen Carotid bruit 05/28/10    Tortuosity of carotids on doppler u/s, no stenosis.   Past Surgical History  Procedure Laterality Date  . Colonoscopy  x 2    Dr. Jarold Motto w/ [facility]  . Tee without cardioversion  05/2010    Dr. Jacinto Halim; severe LVH, no valvular abnormalities  . Cardiovascular stress test  2006 and 04/27/10    Normal stress nuclear study, normal EF  . Transthoracic echocardiogram  05/28/10    Normal LVEF, LVOT obstruction, lateral wall hypokinesis  . Transesophageal echocardiogram  06/06/10    LVOT obst with 135 mm Hg PG.  Not HOCM.   No mitral regurg..  Repeat echo 01/09/12 no change.  . Nasal sinus surgery     Past family and social history reviewed and there are no changes since the patient's last office visit with me.  MEDS:  Outpatient Prescriptions Prior to Visit  Medication Sig Dispense Refill  . albuterol (VENTOLIN HFA) 108 (90 BASE) MCG/ACT inhaler Inhale 2 puffs into the lungs every 4 (four) hours as needed for wheezing.  1 Inhaler  2  . allopurinol (ZYLOPRIM) 300 MG tablet Take 1 tablet (300 mg total) by mouth daily. For gout  30 tablet  5  . aspirin 325 MG tablet Take 325 mg by mouth daily.        . fluticasone (FLONASE) 50 MCG/ACT nasal spray Place 2 sprays into the nose daily.  16 g  2  . glucose blood (ONE TOUCH ULTRA TEST) test strip Use as instructed to check blood  sugar.  DX 250.00  100 each  prn  . metFORMIN (GLUCOPHAGE) 850 MG tablet Take 1 tablet (850 mg total) by mouth 2 (two) times daily with a meal.  60 tablet  6  . ONE TOUCH LANCETS MISC Use as directed to check blood sugar.  DX 250.00  200 each  prn  . pravastatin (PRAVACHOL) 80 MG tablet Take 1 tablet (80 mg total) by mouth daily.  30 tablet  5  . verapamil (CALAN-SR) 120 MG CR tablet Take 1 tablet by mouth Daily.      Marland Kitchen esomeprazole (NEXIUM) 40 MG capsule Take 1 capsule (40 mg total) by mouth daily.  30 capsule  6  . azithromycin (ZITHROMAX Z-PAK) 250 MG tablet 2 pills on day 1, then 1 pill daily for 4 days  6 each  0  . Docusate Calcium (STOOL SOFTENER PO) Take 1 capsule by mouth at bedtime.      . furosemide (LASIX) 20 MG tablet Take 1 tablet by mouth Once daily as needed.      . metoprolol (LOPRESSOR) 50 MG tablet Take 50 mg by mouth 2 (two) times daily. As per Dr. Jacinto Halim      . potassium chloride SA (K-DUR,KLOR-CON) 20 MEQ tablet Take 1 tablet by mouth Once daily as needed.      . predniSONE (DELTASONE) 10 MG tablet Take 4 tabs daily x 2 days, 3 tabs daily x 2 days, 2 tabs daily x 2 days, 1 tab daily x 2 days then stop  20 tablet  0  . temazepam (RESTORIL) 15 MG capsule Take 15 mg by mouth as needed.        No facility-administered medications prior to visit.    PE: Blood pressure 136/82, pulse 80, temperature 98.2 F (36.8 C), temperature source Oral, resp. rate 18, height 5\' 8"  (1.727 m), weight 279 lb (126.554 kg), SpO2 96.00%. Gen: Alert, well appearing, obese white male in NAD.  Patient is oriented to person, place, time, and situation.  Throughout o/v he had a frequent dry cough. AFFECT: pleasant, lucid thought and speech. ENT: Ears: EACs clear, normal epithelium.  TMs with good light reflex and landmarks bilaterally.  Eyes: no injection, icteris, swelling, or exudate.  EOMI, PERRLA. Nose: no drainage or turbinate edema/swelling.  No injection or focal lesion.  Mouth: lips without  lesion/swelling.  Oral mucosa pink and moist.  Dentition intact and without obvious caries or gingival swelling.  Oropharynx without erythema, exudate, or swelling.  Neck - No masses or thyromegaly or limitation in range of motion CV:  RRR, S1 and S2 fairly distinct, with 3-4/6 systolic murmur heard over entire precordium but loudest at the apex (per his normal cardiac exam) LUNGS: CTA bilat, nonlabored resps.  Exp phase not prolonged.  Forced expiratory maneuver elicits no wheezing or coughing.  IMPRESSION AND PLAN:  Cough Most consistent with laryngopharyngeal reflux. Increase nexium to 40mg  bid. Encouraged pt to follow GERD diet but he did not sound inclined to do this.  He did agree to stop propping himself up on several pillows and to raise the head of his bed a few inches instead. ENT referral order placed today for further eval/management/confirmation. D/C symbicort--he has not improved with this med, and I'm concerned the inhaled steroid may not be helping the pharynx/hypopharynx in regard to this cough.  He does not seem to be on an ACE-I anymore, but I asked him to have his wife call our office later to clarify his current med list.  DIABETES MELLITUS, TYPE II, CONTROLLED, W/O COMPLICATIONS Overdue for routine f/u. We did not discuss this much today at all, but will do HbA1c today.  Heat intolerance I think his normal "hot nature" is worsened by all the coughing he is doing lately. He has no other sx's to support hyperthyroidism, but last TSH was borderline low (0.37 on 12/14/10) so I'll repeat this test today to r/o hyperthyroidism.   An After Visit Summary was printed and given to the patient.  FOLLOW UP: 6 wks

## 2012-11-04 ENCOUNTER — Other Ambulatory Visit: Payer: Self-pay | Admitting: Family Medicine

## 2012-11-04 MED ORDER — PRAVASTATIN SODIUM 80 MG PO TABS: 80.0000 mg | ORAL_TABLET | Freq: Every day | ORAL | Status: DC

## 2012-11-05 ENCOUNTER — Other Ambulatory Visit: Payer: Self-pay | Admitting: Family Medicine

## 2012-11-05 ENCOUNTER — Other Ambulatory Visit: Payer: Self-pay

## 2012-11-05 MED ORDER — GLIPIZIDE ER 5 MG PO TB24: 5.0000 mg | ORAL_TABLET | Freq: Every day | ORAL | Status: DC

## 2012-11-05 MED ORDER — METFORMIN HCL 1000 MG PO TABS: 1000.0000 mg | ORAL_TABLET | Freq: Two times a day (BID) | ORAL | Status: DC

## 2012-11-05 NOTE — Telephone Encounter (Signed)
Sent medications per Dr. Milinda Cave result note.

## 2012-11-30 ENCOUNTER — Ambulatory Visit (INDEPENDENT_AMBULATORY_CARE_PROVIDER_SITE_OTHER): Payer: Medicare Other | Admitting: Family Medicine

## 2012-11-30 ENCOUNTER — Telehealth: Payer: Self-pay | Admitting: Family Medicine

## 2012-11-30 DIAGNOSIS — Z23 Encounter for immunization: Secondary | ICD-10-CM

## 2012-11-30 NOTE — Telephone Encounter (Signed)
Patient aware and is coming in today for 3rd immunization of hep B.

## 2012-12-15 ENCOUNTER — Telehealth: Payer: Self-pay | Admitting: Family Medicine

## 2012-12-15 ENCOUNTER — Encounter: Payer: Self-pay | Admitting: Gastroenterology

## 2012-12-15 DIAGNOSIS — K219 Gastro-esophageal reflux disease without esophagitis: Secondary | ICD-10-CM

## 2012-12-15 NOTE — Telephone Encounter (Signed)
I'll put in referral as requested but I don't think there is a Solicitor named Dr. Sharl Ma with [facility].  I know they have an endocrinologist named Dr. Carren Rang he's confused.  At any rate, I'll put in the referral that pt requests a Dr. Sharl Ma in their GI dept if they have one.-thx

## 2012-12-15 NOTE — Telephone Encounter (Signed)
Patient's cough is getting worse from acid reflux. Patient is requesting a referral to [facility] to Dr. Sharl Ma.

## 2012-12-15 NOTE — Telephone Encounter (Signed)
Please advise 

## 2012-12-16 ENCOUNTER — Ambulatory Visit (INDEPENDENT_AMBULATORY_CARE_PROVIDER_SITE_OTHER): Payer: Self-pay | Admitting: Family Medicine

## 2012-12-16 ENCOUNTER — Encounter: Payer: Self-pay | Admitting: Family Medicine

## 2012-12-16 VITALS — BP 160/82 | HR 97 | Temp 98.1°F | Ht 68.0 in | Wt 279.5 lb

## 2012-12-16 DIAGNOSIS — K219 Gastro-esophageal reflux disease without esophagitis: Secondary | ICD-10-CM

## 2012-12-16 DIAGNOSIS — J209 Acute bronchitis, unspecified: Secondary | ICD-10-CM

## 2012-12-16 DIAGNOSIS — J387 Other diseases of larynx: Secondary | ICD-10-CM

## 2012-12-16 MED ORDER — HYDROCODONE-HOMATROPINE 5-1.5 MG/5ML PO SYRP: ORAL_SOLUTION | ORAL | Status: DC

## 2012-12-16 MED ORDER — PREDNISONE 20 MG PO TABS: ORAL_TABLET | ORAL | Status: DC

## 2012-12-16 NOTE — Assessment & Plan Note (Signed)
Causing chronic cough--he recently requested referral to GI for further eval of this (ENT has already seen him and concurred with this dx). He'll continue nexium 40mg  bid and try to adhere to anti-reflux diet.

## 2012-12-16 NOTE — Progress Notes (Signed)
OFFICE NOTE  12/16/2012  CC:  Chief Complaint  Patient presents with  . Cough     HPI: Patient is a 67 y.o. Caucasian male who is here for acute visit for ongoing problems with his cough. Usually has chronic GERD/LPR cough but lately cough is worse x 4d, chest hurts when coughing lately, feels like he's wheezing.  No fever.  Some ST, o/w no URI sx's.  Worse lying supine.     Pertinent PMH:  Past Medical History  Diagnosis Date  . Diabetes mellitus     type 2- non insulin-requiring  . Hyperlipidemia   . Hypertension     severe LVH on echo  . OSA (obstructive sleep apnea)     Could not tolerate CPAP, not surgical candidate per Dr. Suszanne Conners  . Morbid obesity   . Gout   . GERD (gastroesophageal reflux disease)     EDG 04-03-01, + distal esophageal stricture dilation  . Fatty liver u/s and CT 2009    w/mildly elevated transaminases  . Dyspnea     Cardiology w/u unrevealing except LVOT obstruction: suspect dyspnea due to morbid obesity and OSA  . Subvalvular aortic stenosis 2012    LVOT obstruction stable on echo 01/09/12--without evidence of HOCM.  Marland Kitchen Carotid bruit 05/28/10    Tortuosity of carotids on doppler u/s, no stenosis.   Past Surgical History  Procedure Laterality Date  . Colonoscopy  x 2    Dr. Jarold Motto w/ [facility]  . Tee without cardioversion  05/2010    Dr. Jacinto Halim; severe LVH, no valvular abnormalities  . Cardiovascular stress test  2006 and 04/27/10    Normal stress nuclear study, normal EF  . Transthoracic echocardiogram  05/28/10    Normal LVEF, LVOT obstruction, lateral wall hypokinesis  . Transesophageal echocardiogram  06/06/10    LVOT obst with 135 mm Hg PG.  Not HOCM.   No mitral regurg..  Repeat echo 01/09/12 no change.  . Nasal sinus surgery      MEDS:  Outpatient Prescriptions Prior to Visit  Medication Sig Dispense Refill  . albuterol (VENTOLIN HFA) 108 (90 BASE) MCG/ACT inhaler Inhale 2 puffs into the lungs every 4 (four) hours as needed for wheezing.   1 Inhaler  2  . allopurinol (ZYLOPRIM) 300 MG tablet Take 1 tablet (300 mg total) by mouth daily. For gout  30 tablet  5  . aspirin 325 MG tablet Take 325 mg by mouth daily.        Tery Sanfilippo Calcium (STOOL SOFTENER PO) Take 1 capsule by mouth at bedtime.      Marland Kitchen esomeprazole (NEXIUM) 40 MG capsule 1 cap po bid  60 capsule  3  . fluticasone (FLONASE) 50 MCG/ACT nasal spray Place 2 sprays into the nose daily.  16 g  2  . furosemide (LASIX) 20 MG tablet Take 1 tablet by mouth Once daily as needed.      Marland Kitchen glipiZIDE (GLUCOTROL XL) 5 MG 24 hr tablet Take 1 tablet (5 mg total) by mouth daily.  30 tablet  3  . glucose blood (ONE TOUCH ULTRA TEST) test strip Use as instructed to check blood sugar.  DX 250.00  100 each  prn  . metFORMIN (GLUCOPHAGE) 1000 MG tablet Take 1 tablet (1,000 mg total) by mouth 2 (two) times daily with a meal.  60 tablet  6  . metoprolol (LOPRESSOR) 50 MG tablet Take 50 mg by mouth 2 (two) times daily. As per Dr. Jacinto Halim      .  ONE TOUCH LANCETS MISC Use as directed to check blood sugar.  DX 250.00  200 each  prn  . potassium chloride SA (K-DUR,KLOR-CON) 20 MEQ tablet Take 1 tablet by mouth Once daily as needed.      . pravastatin (PRAVACHOL) 80 MG tablet Take 1 tablet (80 mg total) by mouth daily.  30 tablet  5  . temazepam (RESTORIL) 15 MG capsule Take 15 mg by mouth as needed.       . verapamil (CALAN-SR) 120 MG CR tablet Take 1 tablet by mouth Daily.      Marland Kitchen azithromycin (ZITHROMAX Z-PAK) 250 MG tablet 2 pills on day 1, then 1 pill daily for 4 days  6 each  0  . lisinopril (PRINIVIL,ZESTRIL) 20 MG tablet Take 20 mg by mouth daily.      . predniSONE (DELTASONE) 10 MG tablet Take 4 tabs daily x 2 days, 3 tabs daily x 2 days, 2 tabs daily x 2 days, 1 tab daily x 2 days then stop  20 tablet  0   No facility-administered medications prior to visit.    PE: Blood pressure 160/82, pulse 97, temperature 98.1 F (36.7 C), temperature source Temporal, height 5\' 8"  (1.727 m), weight 279  lb 8 oz (126.78 kg), SpO2 93.00%. Gen: Alert, well appearing.  Patient is oriented to person, place, time, and situation. ENT: Ears: EACs clear, normal epithelium.  TMs with good light reflex and landmarks bilaterally.  Eyes: no injection, icteris, swelling, or exudate.  EOMI, PERRLA. Nose: no drainage or turbinate edema/swelling.  No injection or focal lesion.  Mouth: lips without lesion/swelling.  Oral mucosa pink and moist.  Dentition intact and without obvious caries or gingival swelling.  Oropharynx without erythema, exudate, or swelling.  Neck - No masses or thyromegaly or limitation in range of motion CV: RRR, harsh 4/6 systolic murmur, no diastolic murmur, no rub or gallop. LUNGS: mild, diffuse exp harsh wheeze esp with forced exhalation.  Nonlabored resps.  +Post-exhalation coughing--moderate.  IMPRESSION AND PLAN:  Acute bronchitis Superimposed on his chronic GER/LPR cough. Rx'd prednisone 60mg  x 4d, 40mg  x 3d, 20mg  x 2d, then stop. Recommended he use his ventolin HFA 1-2 puffs q4hh prn. Hycodan 1-2 tsp q6h prn, #156ml, no RF.   Laryngopharyngeal reflux (LPR) Causing chronic cough--he recently requested referral to GI for further eval of this (ENT has already seen him and concurred with this dx). He'll continue nexium 40mg  bid and try to adhere to anti-reflux diet.   An After Visit Summary was printed and given to the patient.  FOLLOW UP: prn

## 2012-12-16 NOTE — Assessment & Plan Note (Signed)
Superimposed on his chronic GER/LPR cough. Rx'd prednisone 60mg  x 4d, 40mg  x 3d, 20mg  x 2d, then stop. Recommended he use his ventolin HFA 1-2 puffs q4hh prn. Hycodan 1-2 tsp q6h prn, #161ml, no RF.

## 2012-12-18 ENCOUNTER — Telehealth: Payer: Self-pay | Admitting: Family Medicine

## 2012-12-18 DIAGNOSIS — K219 Gastro-esophageal reflux disease without esophagitis: Secondary | ICD-10-CM

## 2012-12-18 DIAGNOSIS — I10 Essential (primary) hypertension: Secondary | ICD-10-CM

## 2012-12-18 DIAGNOSIS — R05 Cough: Secondary | ICD-10-CM

## 2012-12-18 DIAGNOSIS — E119 Type 2 diabetes mellitus without complications: Secondary | ICD-10-CM

## 2012-12-18 MED ORDER — GLIPIZIDE ER 5 MG PO TB24: 5.0000 mg | ORAL_TABLET | Freq: Every day | ORAL | Status: DC

## 2012-12-18 MED ORDER — PRAVASTATIN SODIUM 80 MG PO TABS: 80.0000 mg | ORAL_TABLET | Freq: Every day | ORAL | Status: DC

## 2012-12-18 MED ORDER — ESOMEPRAZOLE MAGNESIUM 40 MG PO CPDR: DELAYED_RELEASE_CAPSULE | ORAL | Status: DC

## 2012-12-18 MED ORDER — METFORMIN HCL 1000 MG PO TABS: 1000.0000 mg | ORAL_TABLET | Freq: Two times a day (BID) | ORAL | Status: DC

## 2012-12-18 MED ORDER — ALLOPURINOL 300 MG PO TABS: 300.0000 mg | ORAL_TABLET | Freq: Every day | ORAL | Status: DC

## 2012-12-18 NOTE — Telephone Encounter (Signed)
Patient's wife states that the pharmacy is [facility].  I will send in medications to pharmacy for 90 day supply.

## 2012-12-18 NOTE — Telephone Encounter (Signed)
Patient dropped off fax #'s for [place] to send in 90 day supply of medications.  I called back to get the name and location of the pharmacy I would be sending rx's to because I could not find them electronically through [facility].  LMOM for patients wife, Randa Evens to return call.

## 2012-12-23 ENCOUNTER — Encounter: Payer: Self-pay | Admitting: *Deleted

## 2012-12-24 ENCOUNTER — Telehealth: Payer: Self-pay | Admitting: Family Medicine

## 2012-12-24 NOTE — Telephone Encounter (Signed)
Patient advised mail order pharmacy will be contacting out office to reduce dosage of Nexium from 2 a day to a once day.

## 2012-12-24 NOTE — Telephone Encounter (Signed)
Got refill request.  Sent it back as prescribed.  Twice daily!

## 2012-12-25 ENCOUNTER — Ambulatory Visit (INDEPENDENT_AMBULATORY_CARE_PROVIDER_SITE_OTHER): Payer: Medicare Other | Admitting: Gastroenterology

## 2012-12-25 ENCOUNTER — Encounter: Payer: Self-pay | Admitting: Gastroenterology

## 2012-12-25 ENCOUNTER — Other Ambulatory Visit (INDEPENDENT_AMBULATORY_CARE_PROVIDER_SITE_OTHER): Payer: Medicare Other

## 2012-12-25 VITALS — BP 118/80 | HR 64 | Ht 68.0 in | Wt 269.2 lb

## 2012-12-25 DIAGNOSIS — R0989 Other specified symptoms and signs involving the circulatory and respiratory systems: Secondary | ICD-10-CM

## 2012-12-25 DIAGNOSIS — R09A2 Foreign body sensation, throat: Secondary | ICD-10-CM

## 2012-12-25 DIAGNOSIS — K219 Gastro-esophageal reflux disease without esophagitis: Secondary | ICD-10-CM

## 2012-12-25 DIAGNOSIS — R059 Cough, unspecified: Secondary | ICD-10-CM

## 2012-12-25 DIAGNOSIS — R748 Abnormal levels of other serum enzymes: Secondary | ICD-10-CM

## 2012-12-25 DIAGNOSIS — R05 Cough: Secondary | ICD-10-CM

## 2012-12-25 DIAGNOSIS — F458 Other somatoform disorders: Secondary | ICD-10-CM

## 2012-12-25 LAB — VITAMIN B12: Vitamin B-12: 386 pg/mL (ref 211–911)

## 2012-12-25 LAB — FOLATE: Folate: 16.6 ng/mL (ref >5.9)

## 2012-12-25 LAB — IBC PANEL: Iron: 59 ug/dL (ref 42–165)

## 2012-12-25 MED ORDER — PANTOPRAZOLE SODIUM 40 MG PO TBEC: 40.0000 mg | DELAYED_RELEASE_TABLET | Freq: Every day | ORAL | Status: DC

## 2012-12-25 NOTE — Patient Instructions (Signed)
You have been scheduled for an endoscopy with propofol. Please follow written instructions given to you at your visit today. If you use inhalers (even only as needed), please bring them with you on the day of your procedure. Your physician has requested that you go to www.startemmi.com and enter the access code given to you at your visit today. This web site gives a general overview about your procedure. However, you should still follow specific instructions given to you by our office regarding your preparation for the procedure.  You watched a video today on Acid Reflux and information is below.  Your physician has requested that you go to the basement for the following lab work before leaving today: Anemia Hepatitis B Surface Antiboby Hepatitis B Surface Antigen  Alpha Feto-Protein    You have been scheduled for an abdominal ultrasound at [facility] (1st floor of hospital) on 12-29-2012 at 11 am. Please arrive 15 minutes prior to your appointment for registration. Make certain not to have anything to eat or drink 6 hours prior to your appointment. Should you need to reschedule your appointment, please contact radiology at [phone]. This test typically takes about 30 minutes to perform.  We have sent the following medications to your pharmacy for you to pick up at your convenience: Pantoprazole 40 mg, please take one tablet by mouth once daily. Please stop the Nexium _______________________________________________________________________  Diet for Gastroesophageal Reflux Disease, Adult Reflux (acid reflux) is when acid from your stomach flows up into the esophagus. When acid comes in contact with the esophagus, the acid causes irritation and soreness (inflammation) in the esophagus. When reflux happens often or so severely that it causes damage to the esophagus, it is called gastroesophageal reflux disease (GERD). Nutrition therapy can help ease the discomfort of GERD. FOODS OR  DRINKS TO AVOID OR LIMIT  Smoking or chewing tobacco. Nicotine is one of the most potent stimulants to acid production in the gastrointestinal tract.  Caffeinated and decaffeinated coffee and black tea.  Regular or low-calorie carbonated beverages or energy drinks (caffeine-free carbonated beverages are allowed).   Strong spices, such as black pepper, white pepper, red pepper, cayenne, curry powder, and chili powder.  Peppermint or spearmint.  Chocolate.  High-fat foods, including meats and fried foods. Extra added fats including oils, butter, salad dressings, and nuts. Limit these to less than 8 tsp per day.  Fruits and vegetables if they are not tolerated, such as citrus fruits or tomatoes.  Alcohol.  Any food that seems to aggravate your condition. If you have questions regarding your diet, call your caregiver or a registered dietitian. OTHER THINGS THAT MAY HELP GERD INCLUDE:   Eating your meals slowly, in a relaxed setting.  Eating 5 to 6 small meals per day instead of 3 large meals.  Eliminating food for a period of time if it causes distress.  Not lying down until 3 hours after eating a meal.  Keeping the head of your bed raised 6 to 9 inches (15 to 23 cm) by using a foam wedge or blocks under the legs of the bed. Lying flat may make symptoms worse.  Being physically active. Weight loss may be helpful in reducing reflux in overweight or obese adults.  Wear loose fitting clothing EXAMPLE MEAL PLAN This meal plan is approximately 2,000 calories based on https://www.[facility].org/ meal planning guidelines. Breakfast   cup cooked oatmeal.  1 cup strawberries.  1 cup low-fat milk.  1 oz almonds. Snack  1 cup cucumber slices.  6 oz yogurt (made from low-fat or fat-free milk). Lunch  2 slice whole-wheat bread.  2 oz sliced [place].  2 tsp mayonnaise.  1 cup blueberries.  1 cup snap peas. Snack  6 whole-wheat crackers.  1 oz string cheese. Dinner   cup  brown rice.  1 cup mixed veggies.  1 tsp olive oil.  3 oz grilled fish. Document Released: 04/15/2005 Document Revised: 07/08/2011 Document Reviewed: 03/01/2011 ExitCare Patient Information 2014 [employer].     ________________________________________________________________________________________ We are excited to introduce MyChart, a new best-in-class service that provides you online access to important information in your electronic medical record. We want to make it easier for you to view your health information - all in one secure location - when and where you need it. We expect MyChart will enhance the quality of care and service we provide.  When you register for MyChart, you can:    View your test results.    Request appointments and receive appointment reminders via email.    Request medication renewals.    View your medical history, allergies, medications and immunizations.    Communicate with your physician's office through a password-protected site.    Conveniently print information such as your medication lists.  To find out if MyChart is right for you, please talk to a member of our clinical staff today. We will gladly answer your questions about this free health and wellness tool.  If you are age 46 or older and want a member of your family to have access to your record, you must provide written consent by completing a proxy form available at our office. Please speak to our clinical staff about guidelines regarding accounts for patients younger than age 70.  As you activate your MyChart account and need any technical assistance, please call the MyChart technical support line at [phone]) or email your question to [email or web address]. If you email your question(s), please include your name, a return phone number and the best time to reach you.  If you have non-urgent health-related questions, you can send a message to our office  through MyChart at [email or web address]. If you have a medical emergency, call 911.  Thank you for using MyChart as your new health and wellness resource!   MyChart licensed from [employer],  4540-9811. Patents Pending.

## 2012-12-25 NOTE — Progress Notes (Signed)
History of Present Illness:  This is a 67 year old Caucasian male with over 10 years of acid reflux with last endoscopy performed in December 2002.  At that time he had a hiatal hernia and a circumferential peptic stricture of the esophagus. Hehass been on Nexium chronically, now relates  3 months of a globus sensation in his throat with rather severe coughing but no true dysphagia or painful swallowing.  He denies typical regurgitation or burning substernal chest pain, chest pain with exertion, or any cardiovascular or pulmonary complaints.  There is no history of Raynaud's phenomenon, or other symptoms of collagen vascular disease.  He does suffer from chronic obesity.  The patient is up-to-date on his colonoscopy exam.  Review of labs does show mild transaminase elevations.  There is no known history of gallbladder liver disease.  He is on Pravachol therapy for hyperlipidemia, has adult onset diabetes treated with Glucophage, glipizide, Lasix, and allopurinol for gout.  I have reviewed this patient's present history, medical and surgical past history, allergies and medications.     ROS:   All systems were reviewed and are negative unless otherwise stated in the HPI.    Physical Exam: Blood pressure 118/80, pulse 64 and regular, and weight 269 with a BMI of 40.94.  I cannot appreciate stigmata of chronic liver disease. General well developed well nourished patient in no acute distress, appearing their stated age Eyes PERRLA, no icterus, fundoscopic exam per opthamologist Skin no lesions noted Neck supple, no adenopathy, no thyroid enlargement, no tenderness Chest clear to percussion and auscultation Heart no significant murmurs, gallops or rubs noted Abdomen no hepatosplenomegaly masses or tenderness, BS normal.  Extremities no acute joint lesions, edema, phlebitis or evidence of cellulitis. Neurologic patient oriented x 3, cranial nerves intact, no focal neurologic deficits noted. Psychological  mental status normal and normal affect.  Assessment and plan: Morbidly obese patient with chronic GERD and probable extra esophageal manifestations of GERD.  I have changed him from Nexium to Dexilant 60 mg every morning and explained reflux regime in detail to the patient and his wife.  We will schedule followup endoscopy at his convenience.  Review of his labs show an elevated AST of 71 and ALT of 81 cyst with fatty infiltration of his liver vs other metabolic liver disease or drug hepatitis..  I've scheduled hepatic ultrasound and also check hepatitis C anybody, hepatitis B surface antigen, alpha-fetoprotein level, and iron levels.  Thereis certainly  no evidence on exam of chronic liver disease.

## 2012-12-26 LAB — HEPATITIS B SURFACE ANTIBODY, QUANTITATIVE: Hepatitis B-Post: 0 m[IU]/mL

## 2012-12-29 ENCOUNTER — Ambulatory Visit (HOSPITAL_COMMUNITY)
Admission: RE | Admit: 2012-12-29 | Discharge: 2012-12-29 | Disposition: A | Discharge status: Home / Self Care | Payer: Medicare Other | Source: Ambulatory Visit | Attending: Gastroenterology | Admitting: Gastroenterology

## 2012-12-29 DIAGNOSIS — R05 Cough: Secondary | ICD-10-CM

## 2012-12-29 DIAGNOSIS — K219 Gastro-esophageal reflux disease without esophagitis: Secondary | ICD-10-CM | POA: Insufficient documentation

## 2012-12-29 DIAGNOSIS — R0989 Other specified symptoms and signs involving the circulatory and respiratory systems: Secondary | ICD-10-CM

## 2012-12-29 DIAGNOSIS — R748 Abnormal levels of other serum enzymes: Secondary | ICD-10-CM | POA: Insufficient documentation

## 2012-12-29 DIAGNOSIS — K7689 Other specified diseases of liver: Secondary | ICD-10-CM | POA: Insufficient documentation

## 2012-12-29 DIAGNOSIS — E669 Obesity, unspecified: Secondary | ICD-10-CM | POA: Insufficient documentation

## 2012-12-30 ENCOUNTER — Encounter: Payer: Self-pay | Admitting: Gastroenterology

## 2013-01-11 ENCOUNTER — Encounter: Payer: Self-pay | Admitting: Family Medicine

## 2013-02-01 ENCOUNTER — Ambulatory Visit (AMBULATORY_SURGERY_CENTER): Payer: Medicare Other | Admitting: Gastroenterology

## 2013-02-01 ENCOUNTER — Encounter: Payer: Self-pay | Admitting: Gastroenterology

## 2013-02-01 VITALS — BP 154/89 | HR 64 | Temp 97.9°F | Resp 16 | Ht 68.0 in | Wt 269.0 lb

## 2013-02-01 DIAGNOSIS — K219 Gastro-esophageal reflux disease without esophagitis: Secondary | ICD-10-CM

## 2013-02-01 DIAGNOSIS — R1310 Dysphagia, unspecified: Secondary | ICD-10-CM

## 2013-02-01 MED ORDER — SODIUM CHLORIDE 0.9 % IV SOLN: 500.0000 mL | INTRAVENOUS | Status: DC

## 2013-02-01 NOTE — Progress Notes (Signed)
Patient did not have preoperative order for IV antibiotic SSI prophylaxis. (G8918)  Patient did not experience any of the following events: a burn prior to discharge; a fall within the facility; wrong site/side/patient/procedure/implant event; or a hospital transfer or hospital admission upon discharge from the facility. (G8907)  

## 2013-02-01 NOTE — Patient Instructions (Signed)
YOU HAD AN ENDOSCOPIC PROCEDURE TODAY AT THE [facility]: Refer to the procedure report that was given to you for any specific questions about what was found during the examination.  If the procedure report does not answer your questions, please call your gastroenterologist to clarify.  If you requested that your care partner not be given the details of your procedure findings, then the procedure report has been included in a sealed envelope for you to review at your convenience later.  YOU SHOULD EXPECT: Some feelings of bloating in the abdomen. Passage of more gas than usual.  Walking can help get rid of the air that was put into your GI tract during the procedure and reduce the bloating. If you had a lower endoscopy (such as a colonoscopy or flexible sigmoidoscopy) you may notice spotting of blood in your stool or on the toilet paper. If you underwent a bowel prep for your procedure, then you may not have a normal bowel movement for a few days.  DIET: NOTHING TO EAT OR DRINK UNTIL 2:30 PM. 2:30 UNTIL 3:30 ONLY CLEAR LIQUIDS. AFTER 3:30 ONLY SOFT FOODS UNTIL AM. RESUME YOUR REGULAR DIET IN AM.  ACTIVITY: Your care partner should take you home directly after the procedure.  You should plan to take it easy, moving slowly for the rest of the day.  You can resume normal activity the day after the procedure however you should NOT DRIVE or use heavy machinery for 24 hours (because of the sedation medicines used during the test).    SYMPTOMS TO REPORT IMMEDIATELY: A gastroenterologist can be reached at any hour.  During normal business hours, 8:30 AM to 5:00 PM Monday through Friday, call [phone].  After hours and on weekends, please call the GI answering service at [phone] who will take a message and have the physician on call contact you.   Following upper endoscopy (EGD)  Vomiting of blood or coffee ground material  New chest pain or pain under the shoulder blades  Painful  or persistently difficult swallowing  New shortness of breath  Fever of 100F or higher  Black, tarry-looking stools  FOLLOW UP: If any biopsies were taken you will be contacted by phone or by letter within the next 1-3 weeks.  Call your gastroenterologist if you have not heard about the biopsies in 3 weeks.  Our staff will call the home number listed on your records the next business day following your procedure to check on you and address any questions or concerns that you may have at that time regarding the information given to you following your procedure. This is a courtesy call and so if there is no answer at the home number and we have not heard from you through the emergency physician on call, we will assume that you have returned to your regular daily activities without incident.  SIGNATURES/CONFIDENTIALITY: You and/or your care partner have signed paperwork which will be entered into your electronic medical record.  These signatures attest to the fact that that the information above on your After Visit Summary has been reviewed and is understood.  Full responsibility of the confidentiality of this discharge information lies with you and/or your care-partner.

## 2013-02-01 NOTE — Progress Notes (Signed)
Procedure ends, to recovery, report given and VSS. 

## 2013-02-01 NOTE — Progress Notes (Signed)
Called to room to assist during endoscopic procedure.  Patient ID and intended procedure confirmed with present staff. Received instructions for my participation in the procedure from the performing physician.  

## 2013-02-01 NOTE — Op Note (Signed)
 [facility] [address] [place], [address]   ENDOSCOPY PROCEDURE REPORT  PATIENT: Jack, [name]  MR#: [id number] BIRTHDATE: 1945-06-27 , 66  yrs. old GENDER: Male ENDOSCOPIST:Joyclyn Plazola Hale Bogus, MD, Clementeen Graham REFERRED BY: Earley Favor, [name] PROCEDURE DATE:  02/01/2013 PROCEDURE:   EGD, diagnostic and Maloney dilation of esophagus ASA CLASS:    Class III INDICATIONS: Dysphagia and History of reflux esophagitis. MEDICATION: Propofol (Diprivan) 130 mg IV TOPICAL ANESTHETIC:   Cetacaine Spray  DESCRIPTION OF PROCEDURE:   After the risks and benefits of the procedure were explained, informed consent was obtained.  The LB [id number]  endoscope was introduced through the mouth  and advanced to the second portion of the duodenum .  The instrument was slowly withdrawn as the mucosa was fully examined.    The upper, middle and distal third of the esophagus were carefully inspected and no abnormalities were noted.  The z-line was well seen at the GEJ. Empirically dilated #36F Maloney dilator,no heme or pain noted. The endoscope was pushed into the fundus which was normal including a retroflexed view.  The antrum, gastric body, first and second part of the duodenum were unremarkable. Retroflexed views revealed a small hiatal hernia.    The scope was then withdrawn from the patient and the procedure completed.  COMPLICATIONS: There were no complications.   ENDOSCOPIC IMPRESSION: Normal EGD .Marland Kitchenprobale chronic and Gerd and probale peptic stricture dilated.  RECOMMENDATIONS: 1.  Continue PPI 2.  Continue current meds 3.  Dilatations PRN    _______________________________ eSigned:  Mardella Layman, MD, [facility] 02/01/2013 1:40 PM   antireflux   PATIENT NAME:  Jack, [name] MR#: [id number]

## 2013-02-02 ENCOUNTER — Telehealth: Payer: Self-pay | Admitting: *Deleted

## 2013-02-02 NOTE — Telephone Encounter (Signed)
  Follow up Call-  Call back number 02/01/2013  Post procedure Call Back phone  # [phone]  Permission to leave phone message Yes     Patient questions:  Do you have a fever, pain , or abdominal swelling? no Pain Score  0 *  Have you tolerated food without any problems? yes  Have you been able to return to your normal activities? yes  Do you have any questions about your discharge instructions: Diet   no Medications  no Follow up visit  no  Do you have questions or concerns about your Care? no  Actions: * If pain score is 4 or above: No action needed, pain <4.

## 2013-02-05 ENCOUNTER — Ambulatory Visit (INDEPENDENT_AMBULATORY_CARE_PROVIDER_SITE_OTHER): Payer: Medicare Other

## 2013-02-05 ENCOUNTER — Telehealth: Payer: Self-pay | Admitting: *Deleted

## 2013-02-05 DIAGNOSIS — Z23 Encounter for immunization: Secondary | ICD-10-CM

## 2013-02-05 NOTE — Telephone Encounter (Signed)
Patient called about his glucose meter is not working. Requesting a prescription for a new meter. Please advise?

## 2013-02-05 NOTE — Telephone Encounter (Signed)
faxed RX to pharmacy.

## 2013-02-05 NOTE — Telephone Encounter (Signed)
Written rx for one touch ultra glucometer done.

## 2013-05-04 ENCOUNTER — Telehealth: Payer: Self-pay | Admitting: Family Medicine

## 2013-05-04 NOTE — Telephone Encounter (Signed)
You can reach her on her cell phone [phone]

## 2013-05-04 NOTE — Telephone Encounter (Signed)
Patient's wife LM stating that patient will be switching to [facility].  Patient's wife will be calling me back to let me know which rx's need to be sent in for 90 day supply.

## 2013-05-05 MED ORDER — PRAVASTATIN SODIUM 80 MG PO TABS: 80.0000 mg | ORAL_TABLET | Freq: Every day | ORAL | Status: DC

## 2013-05-05 MED ORDER — GLIPIZIDE ER 5 MG PO TB24: 5.0000 mg | ORAL_TABLET | Freq: Every day | ORAL | Status: DC

## 2013-05-05 MED ORDER — ALLOPURINOL 300 MG PO TABS: 300.0000 mg | ORAL_TABLET | Freq: Every day | ORAL | Status: DC

## 2013-05-05 MED ORDER — METFORMIN HCL 1000 MG PO TABS
1000.0000 mg | ORAL_TABLET | Freq: Two times a day (BID) | ORAL | Status: DC
Start: 2013-05-05 — End: 2013-10-26

## 2013-05-05 NOTE — Telephone Encounter (Signed)
Medications have been sent to [facility] per patient's wife request.

## 2013-05-06 ENCOUNTER — Telehealth: Payer: Self-pay | Admitting: Gastroenterology

## 2013-05-06 MED ORDER — PANTOPRAZOLE SODIUM 40 MG PO TBEC: 40.0000 mg | DELAYED_RELEASE_TABLET | Freq: Every day | ORAL | Status: DC

## 2013-05-06 NOTE — Telephone Encounter (Signed)
RX refill

## 2013-07-31 ENCOUNTER — Other Ambulatory Visit: Payer: Self-pay | Admitting: Family Medicine

## 2013-08-02 NOTE — Telephone Encounter (Signed)
Please contact patient to come in for a follow up of chronic problems.  Patient hasn't been seen since 10/29/12 for chronic problems.

## 2013-08-03 ENCOUNTER — Ambulatory Visit (INDEPENDENT_AMBULATORY_CARE_PROVIDER_SITE_OTHER): Payer: Medicare Other | Admitting: Family Medicine

## 2013-08-03 ENCOUNTER — Encounter: Payer: Self-pay | Admitting: Family Medicine

## 2013-08-03 VITALS — BP 146/83 | HR 71 | Temp 98.4°F | Resp 18 | Ht 68.0 in | Wt 286.0 lb

## 2013-08-03 DIAGNOSIS — E1165 Type 2 diabetes mellitus with hyperglycemia: Principal | ICD-10-CM

## 2013-08-03 DIAGNOSIS — IMO0001 Reserved for inherently not codable concepts without codable children: Secondary | ICD-10-CM

## 2013-08-03 DIAGNOSIS — E785 Hyperlipidemia, unspecified: Secondary | ICD-10-CM

## 2013-08-03 DIAGNOSIS — Z23 Encounter for immunization: Secondary | ICD-10-CM

## 2013-08-03 LAB — MICROALBUMIN / CREATININE URINE RATIO
Creatinine,U: 193.9 mg/dL
MICROALB/CREAT RATIO: 0.5 mg/g (ref 0.0–30.0)
Microalb, Ur: 0.9 mg/dL (ref 0.0–1.9)

## 2013-08-03 LAB — LIPID PANEL
CHOLESTEROL: 134 mg/dL (ref 0–200)
HDL: 38.2 mg/dL — AB (ref >39.00)
LDL Cholesterol: 69 mg/dL (ref 0–99)
TRIGLYCERIDES: 136 mg/dL (ref 0.0–149.0)
Total CHOL/HDL Ratio: 4
VLDL: 27.2 mg/dL (ref 0.0–40.0)

## 2013-08-03 LAB — HEMOGLOBIN A1C: HEMOGLOBIN A1C: 6.4 % (ref 4.6–6.5)

## 2013-08-03 LAB — COMPREHENSIVE METABOLIC PANEL
ALT: 38 U/L (ref 0–53)
AST: 38 U/L — ABNORMAL HIGH (ref 0–37)
Albumin: 4.2 g/dL (ref 3.5–5.2)
Alkaline Phosphatase: 63 U/L (ref 39–117)
BILIRUBIN TOTAL: 0.7 mg/dL (ref 0.3–1.2)
BUN: 16 mg/dL (ref 6–23)
CALCIUM: 9.5 mg/dL (ref 8.4–10.5)
CHLORIDE: 108 meq/L (ref 96–112)
CO2: 25 meq/L (ref 19–32)
CREATININE: 1.1 mg/dL (ref 0.4–1.5)
GFR: 69.62 mL/min (ref >60.00)
GLUCOSE: 122 mg/dL — AB (ref 70–99)
Potassium: 4.3 mEq/L (ref 3.5–5.1)
Sodium: 140 mEq/L (ref 135–145)
Total Protein: 7 g/dL (ref 6.0–8.3)

## 2013-08-03 NOTE — Progress Notes (Addendum)
OFFICE NOTE  08/03/2013  CC:  Chief Complaint  Patient presents with  . Follow-up     HPI: Patient is a 68 y.o. Caucasian male who is here for f/u DM 2, HTN, hyperlipidemia, hx of fatty liver with elevated AST/ALT. Glucoses "normal". Has plans to get in with Dr. Gershon Crane soon for routine DM screening exam.  No changes in vision. Has some burning in feet after he is up on them all day, othewise no problems.  Says he is feeling good.  No formal exercise.  Diet is fair.   Pertinent PMH:  Past medical, surgical, social, and family history reviewed and no changes are noted since last office visit.  MEDS:  Outpatient Prescriptions Prior to Visit  Medication Sig Dispense Refill  . allopurinol (ZYLOPRIM) 300 MG tablet Take 1 tablet (300 mg total) by mouth daily. For gout  90 tablet  1  . aspirin 325 MG tablet Take 325 mg by mouth daily.        [name] Calcium (STOOL SOFTENER PO) Take 1 capsule by mouth at bedtime.      . furosemide (LASIX) 20 MG tablet Take 1 tablet by mouth Once daily as needed.      Marland Kitchen glipiZIDE (GLUCOTROL XL) 5 MG 24 hr tablet Take 1 tablet (5 mg total) by mouth daily.  90 tablet  1  . metFORMIN (GLUCOPHAGE) 1000 MG tablet Take 1 tablet (1,000 mg total) by mouth 2 (two) times daily with a meal.  180 tablet  1  . metoprolol (LOPRESSOR) 50 MG tablet Take 50 mg by mouth 2 (two) times daily. As per Dr. Einar Gip      . pantoprazole (PROTONIX) 40 MG tablet Take 1 tablet (40 mg total) by mouth daily.  90 tablet  3  . potassium chloride SA (K-DUR,KLOR-CON) 20 MEQ tablet Take 1 tablet by mouth Once daily as needed.      . pravastatin (PRAVACHOL) 80 MG tablet Take 1 tablet (80 mg total) by mouth daily.  90 tablet  1  . verapamil (CALAN-SR) 120 MG CR tablet Take 1 tablet by mouth Daily.      Marland Kitchen albuterol (VENTOLIN HFA) 108 (90 BASE) MCG/ACT inhaler Inhale 2 puffs into the lungs every 4 (four) hours as needed for wheezing.  1 Inhaler  2  . fluticasone (FLONASE) 50 MCG/ACT nasal spray  Place 2 sprays into the nose daily.  16 g  2  . glucose blood (ONE TOUCH ULTRA TEST) test strip Use as instructed to check blood sugar.  DX 250.00  100 each  prn  . ONE TOUCH LANCETS MISC Use as directed to check blood sugar.  DX 250.00  200 each  prn  . temazepam (RESTORIL) 15 MG capsule Take 15 mg by mouth as needed.       Marland Kitchen HYDROcodone-homatropine (HYCODAN) 5-1.5 MG/5ML syrup 1-2 tsp po q6h prn cough  120 mL  0  . predniSONE (DELTASONE) 20 MG tablet 3 tabs po qd x 4d, then 2 tabs po qd x 3d, then 1 tab po qd x 2d, then stop  20 tablet  0   No facility-administered medications prior to visit.    PE: Blood pressure 146/83, pulse 71, temperature 98.4 F (36.9 C), temperature source Temporal, resp. rate 18, height 5\' 8"  (1.727 m), weight 286 lb (129.729 kg), SpO2 96.00%. Gen: Alert, well appearing.  Patient is oriented to person, place, time, and situation. Foot exam - both normal; no swelling, tenderness or skin or vascular lesions.  Color and temperature is normal. Sensation is intact. Peripheral pulses are palpable. Toenails are normal. He does have 1+ pitting edema in both ankles, R a bit > than L.  LABS: none today  Lab Results  Component Value Date   HGBA1C 8.6* 10/29/2012     Chemistry      Component Value Date/Time   NA 137 10/29/2012 1218   K 4.6 10/29/2012 1218   CL 106 10/29/2012 1218   CO2 25 10/29/2012 1218   BUN 14 10/29/2012 1218   CREATININE 1.1 10/29/2012 1218      Component Value Date/Time   CALCIUM 9.7 10/29/2012 1218   ALKPHOS 83 10/29/2012 1218   AST 71* 10/29/2012 1218   ALT 81* 10/29/2012 1218   BILITOT 0.8 10/29/2012 1218     Lab Results  Component Value Date   CHOL 157 06/06/2011   HDL 40.00 06/06/2011   LDLCALC 88 06/06/2011   TRIG 144.0 06/06/2011   CHOLHDL 4 06/06/2011   IMPRESSION AND PLAN:  1) DM 2, non insulin-requiring.  Control good per home checks but last HbA1c was a bit up. Recheck A1c today as well as urine microalb/cr.   Feet exam normal today. Has plans for  routine eye screening. Prevnar 13 IM today.  2) HTN: The current medical regimen is effective;  continue present plan and medications. CMET today  3) Hx of elevated transaminases secondary to fatty liver: repeat AST/ALT today.  4) Hyperlipidemia: due for FLP today.  FOLLOW UP:  4 mo for annual medicare wellness exam

## 2013-08-03 NOTE — Addendum Note (Signed)
Addended by: Julieta Bellini on: 08/03/2013 08:46 AM   Modules accepted: Orders

## 2013-08-03 NOTE — Progress Notes (Signed)
Pre visit review using our clinic review tool, if applicable. No additional management support is needed unless otherwise documented below in the visit note. 

## 2013-08-10 ENCOUNTER — Encounter: Payer: Self-pay | Admitting: Family Medicine

## 2013-08-10 ENCOUNTER — Ambulatory Visit (INDEPENDENT_AMBULATORY_CARE_PROVIDER_SITE_OTHER): Payer: Medicare Other | Admitting: Family Medicine

## 2013-08-10 VITALS — BP 141/73 | HR 86 | Temp 99.2°F | Resp 18 | Ht 68.0 in | Wt 282.0 lb

## 2013-08-10 DIAGNOSIS — J209 Acute bronchitis, unspecified: Secondary | ICD-10-CM

## 2013-08-10 MED ORDER — METHYLPREDNISOLONE ACETATE 80 MG/ML IJ SUSP
80.0000 mg | Freq: Once | INTRAMUSCULAR | Status: AC
  Administered 2013-08-10: 80 mg via INTRAMUSCULAR

## 2013-08-10 MED ORDER — HYDROCODONE-HOMATROPINE 5-1.5 MG/5ML PO SYRP: ORAL_SOLUTION | ORAL | Status: DC

## 2013-08-10 NOTE — Progress Notes (Addendum)
OFFICE NOTE  08/10/2013  CC:  Chief Complaint  Patient presents with  . Cough  . chest congestion     HPI: Patient is a 68 y.o. Caucasian male who is here for respiratory symptoms. Head stuffy, throat irritated, lots of coughing, some brownish mucous production. No fevers.  Chest feels wheezy.   Some sneezing, itchy nose.  She tried "a bunch of junk" OTC.  Pertinent PMH:  Past medical, surgical, social, and family history reviewed and no changes are noted since last office visit.  MEDS:  Outpatient Prescriptions Prior to Visit  Medication Sig Dispense Refill  . albuterol (VENTOLIN HFA) 108 (90 BASE) MCG/ACT inhaler Inhale 2 puffs into the lungs every 4 (four) hours as needed for wheezing.  1 Inhaler  2  . allopurinol (ZYLOPRIM) 300 MG tablet Take 1 tablet (300 mg total) by mouth daily. For gout  90 tablet  1  . aspirin 325 MG tablet Take 325 mg by mouth daily.        [name] Calcium (STOOL SOFTENER PO) Take 1 capsule by mouth at bedtime.      . fluticasone (FLONASE) 50 MCG/ACT nasal spray Place 2 sprays into the nose daily.  16 g  2  . furosemide (LASIX) 20 MG tablet Take 1 tablet by mouth Once daily as needed.      Marland Kitchen glipiZIDE (GLUCOTROL XL) 5 MG 24 hr tablet Take 1 tablet (5 mg total) by mouth daily.  90 tablet  1  . metFORMIN (GLUCOPHAGE) 1000 MG tablet Take 1 tablet (1,000 mg total) by mouth 2 (two) times daily with a meal.  180 tablet  1  . metoprolol (LOPRESSOR) 50 MG tablet Take 50 mg by mouth 2 (two) times daily. As per Dr. Einar Gip      . pantoprazole (PROTONIX) 40 MG tablet Take 1 tablet (40 mg total) by mouth daily.  90 tablet  3  . potassium chloride SA (K-DUR,KLOR-CON) 20 MEQ tablet Take 1 tablet by mouth Once daily as needed.      . pravastatin (PRAVACHOL) 80 MG tablet Take 1 tablet (80 mg total) by mouth daily.  90 tablet  1  . temazepam (RESTORIL) 15 MG capsule Take 15 mg by mouth as needed.       . verapamil (CALAN-SR) 120 MG CR tablet Take 1 tablet by mouth  Daily.      Marland Kitchen glucose blood (ONE TOUCH ULTRA TEST) test strip Use as instructed to check blood sugar.  DX 250.00  100 each  prn  . ONE TOUCH LANCETS MISC Use as directed to check blood sugar.  DX 250.00  200 each  prn   No facility-administered medications prior to visit.    PE: Blood pressure 141/73, pulse 86, temperature 99.2 F (37.3 C), temperature source Temporal, resp. rate 18, height 5\' 8"  (1.727 m), weight 282 lb (127.914 kg), SpO2 94.00%. VS: noted--normal. Gen: alert, NAD, NONTOXIC APPEARING. HEENT: eyes without injection, drainage, or swelling.  Ears: EACs clear, TMs with normal light reflex and landmarks.  Nose: Clear rhinorrhea, with some dried, crusty exudate adherent to mildly injected mucosa.  No purulent d/c.  No paranasal sinus TTP.  No facial swelling.  Throat and mouth without focal lesion.  No pharyngial swelling, erythema, or exudate.   Neck: supple, no LAD.   LUNGS: CTA bilat, nonlabored resps.  Frequent end exp couging.  Occ insp/exp rhonchi that clear with coughing.  Exp phase not prolonged. CV: Regular, 3/6 harsh systolic murmur, S1 is obscured  but S2 is audible.  No diastolic murmur.  No rub or gallop. EXT: no c/c/e SKIN: no rash   IMPRESSION AND PLAN:  Viral URI with mild RAD bronchitis. Depo-medrol 80mg  IM in office today. Mucinex DM qAM. Hycodan susp, 1-2 tsp q6h prn, #110ml, no RF.  An After Visit Summary was printed and given to the patient.  FOLLOW UP: prn

## 2013-08-10 NOTE — Progress Notes (Signed)
Pre visit review using our clinic review tool, if applicable. No additional management support is needed unless otherwise documented below in the visit note. 

## 2013-08-12 ENCOUNTER — Telehealth: Payer: Self-pay | Admitting: Family Medicine

## 2013-08-12 ENCOUNTER — Ambulatory Visit: Payer: Self-pay

## 2013-08-12 ENCOUNTER — Ambulatory Visit (INDEPENDENT_AMBULATORY_CARE_PROVIDER_SITE_OTHER): Payer: Medicare Other | Admitting: Family Medicine

## 2013-08-12 ENCOUNTER — Encounter: Payer: Self-pay | Admitting: Family Medicine

## 2013-08-12 VITALS — BP 175/78 | HR 83 | Temp 98.8°F | Resp 22 | Ht 68.0 in | Wt 282.0 lb

## 2013-08-12 DIAGNOSIS — J209 Acute bronchitis, unspecified: Secondary | ICD-10-CM

## 2013-08-12 MED ORDER — IPRATROPIUM BROMIDE 0.02 % IN SOLN
0.5000 mg | Freq: Once | RESPIRATORY_TRACT | Status: AC
  Administered 2013-08-12: 0.5 mg via RESPIRATORY_TRACT

## 2013-08-12 MED ORDER — ALBUTEROL SULFATE (2.5 MG/3ML) 0.083% IN NEBU
2.5000 mg | INHALATION_SOLUTION | Freq: Once | RESPIRATORY_TRACT | Status: AC
  Administered 2013-08-12: 2.5 mg via RESPIRATORY_TRACT

## 2013-08-12 MED ORDER — ALBUTEROL SULFATE HFA 108 (90 BASE) MCG/ACT IN AERS: 2.0000 | INHALATION_SPRAY | RESPIRATORY_TRACT | Status: DC | PRN

## 2013-08-12 MED ORDER — PREDNISONE 20 MG PO TABS: ORAL_TABLET | ORAL | Status: DC

## 2013-08-12 MED ORDER — HYDROCODONE-HOMATROPINE 5-1.5 MG/5ML PO SYRP: ORAL_SOLUTION | ORAL | Status: DC

## 2013-08-12 MED ORDER — AZITHROMYCIN 250 MG PO TABS: ORAL_TABLET | ORAL | Status: DC

## 2013-08-12 NOTE — Telephone Encounter (Signed)
Patient Information:  Caller Name: Mechele Claude  Phone: [phone]  Patient: Jack [name], Jack [name]  Gender: Male  DOB: 02/09/1936  Age: 68 Years  PCP: Ricardo Jericho [facility])  Office Follow Up:  Does the office need to follow up with this patient?: No  Instructions For The Office: N/A  RN Note:  Wife advised that per information given pt needs to be seen today.  Appt for 15:00 planned.  Symptoms  Reason For Call & Symptoms: Pt was seen in the office on 4/13 and dx with broncitis.  Pt was dx with Viral URI and pt is concerned that sxs are not any better.  Afebrile.  Main sxs coughing.  Upper resp congestion  Wife reports that Pt is still wheezing.  Pt has "ice packs" on legs for the swelling.  Reviewed Health History In EMR: Yes  Reviewed Medications In EMR: Yes  Reviewed Allergies In EMR: Yes  Reviewed Surgeries / Procedures: Yes  Date of Onset of Symptoms: 08/10/2013  Guideline(s) Used:  Cough  Disposition Per Guideline:   Go to Office Now  Reason For Disposition Reached:   Increasing ankle swelling  Advice Given:  N/A  Patient Will Follow Care Advice:  YES  Appointment Scheduled:  08/12/2013 15:00:00 Appointment Scheduled Provider:  Ricardo Jericho (Family Practice)

## 2013-08-12 NOTE — Progress Notes (Signed)
OFFICE NOTE  08/12/2013  CC:  Chief Complaint  Patient presents with  . Wheezing     HPI: Patient is a 68 y.o. Caucasian Jack [name] who is here for ongoing respiratory complaints. I saw him in office 2 d/a--see note for further details. Reports worsening: coughing all night, mucous production occ blood tinged, temp up to 101. +Wheezing.  He tried some leftover symbicort and ventolin but he felt no relief and no increased HR, exp date on these is 07/2013 and I suspect these are no good. Has been taking hycodan susp 2 tsp for nighttime coughing.  Pertinent PMH:  Past medical, surgical hx reviewed.  MEDS:  Outpatient Prescriptions Prior to Visit  Medication Sig Dispense Refill  . albuterol (VENTOLIN HFA) 108 (90 BASE) MCG/ACT inhaler Inhale 2 puffs into the lungs every 4 (four) hours as needed for wheezing.  1 Inhaler  2  . allopurinol (ZYLOPRIM) 300 MG tablet Take 1 tablet (300 mg total) by mouth daily. For gout  90 tablet  1  . aspirin 325 MG tablet Take 325 mg by mouth daily.        Jack Jack [name] Calcium (STOOL SOFTENER PO) Take 1 capsule by mouth at bedtime.      . fluticasone (FLONASE) 50 MCG/ACT nasal spray Place 2 sprays into the nose daily.  16 g  2  . furosemide (LASIX) 20 MG tablet Take 1 tablet by mouth Once daily as needed.      Jack Jack [name] glipiZIDE (GLUCOTROL XL) 5 MG 24 hr tablet Take 1 tablet (5 mg total) by mouth daily.  90 tablet  1  . glucose blood (ONE TOUCH ULTRA TEST) test strip Use as instructed to check blood sugar.  DX 250.00  100 each  prn  . HYDROcodone-homatropine (HYCODAN) 5-1.5 MG/5ML syrup 1-2 tsp po qhs for cough  120 mL  0  . metFORMIN (GLUCOPHAGE) 1000 MG tablet Take 1 tablet (1,000 mg total) by mouth 2 (two) times daily with a meal.  180 tablet  1  . metoprolol (LOPRESSOR) 50 MG tablet Take 50 mg by mouth 2 (two) times daily. As per Dr. Einar Gip      . ONE TOUCH LANCETS MISC Use as directed to check blood sugar.  DX 250.00  200 each  prn  . pantoprazole (PROTONIX) 40 MG  tablet Take 1 tablet (40 mg total) by mouth daily.  90 tablet  3  . potassium chloride SA (K-DUR,KLOR-CON) 20 MEQ tablet Take 1 tablet by mouth Once daily as needed.      . pravastatin (PRAVACHOL) 80 MG tablet Take 1 tablet (80 mg total) by mouth daily.  90 tablet  1  . temazepam (RESTORIL) 15 MG capsule Take 15 mg by mouth as needed.       . verapamil (CALAN-SR) 120 MG CR tablet Take 1 tablet by mouth Daily.       No facility-administered medications prior to visit.    PE: Blood pressure 175/78, pulse 83, temperature 98.8 F (37.1 C), temperature source Temporal, resp. rate 22, height 5\' 8"  (1.727 m), weight 282 lb (127.914 kg), SpO2 94.00%. Gen: alert, oriented x 4.  NAD but looks tired. ENT: Ears: EACs clear, normal epithelium.  TMs with good light reflex and landmarks bilaterally.  Eyes: no injection, icteris, swelling, or exudate.  EOMI, PERRLA. Nose: no drainage or turbinate edema/swelling.  No injection or focal lesion.  Mouth: lips without lesion/swelling.  Oral mucosa pink and moist.  Dentition intact and without obvious caries or gingival  swelling.  Oropharynx without erythema, exudate, or swelling.  Neck - No masses or thyromegaly or limitation in range of motion CV: Regular with occ ectopic beat, 2-3/6 syst murmur, no diastolic murmur.  No rub or gallop. LUNGS: mildly diminished BS in both bases of posterior lung fields, decreased aeration on expiration and prolonged exp phase noted alternating with exp phase being interrupted by excessive coughing.  Nonlabored resps.  No tachypnea. EXT: trace pretibial pitting edema bilat  LAB: none today  IMPRESSION AND PLAN:  Acute asthmatic bronchitis, worsened over the last 48h. With alb/atr neb in office today he felt significant relief, aeration improved. Azithromycin x 5d.  Prednisone 40mg  qd x 5d, then 20mg  qd x 5d. Ventolin HFA rx sent in. Sample of symbicort 160/4.5, 2 puffs bid x 2 weeks (1 sample given today).  An After Visit  Summary was printed and given to the patient.  FOLLOW UP: prn

## 2013-08-12 NOTE — Progress Notes (Signed)
Pre visit review using our clinic review tool, if applicable. No additional management support is needed unless otherwise documented below in the visit note. 

## 2013-08-20 ENCOUNTER — Encounter: Payer: Self-pay | Admitting: Nurse Practitioner

## 2013-08-20 ENCOUNTER — Ambulatory Visit (INDEPENDENT_AMBULATORY_CARE_PROVIDER_SITE_OTHER): Payer: Medicare Other | Admitting: Nurse Practitioner

## 2013-08-20 ENCOUNTER — Telehealth: Payer: Self-pay

## 2013-08-20 VITALS — BP 126/84 | HR 68 | Temp 97.7°F | Resp 18 | Ht 68.0 in | Wt 277.0 lb

## 2013-08-20 DIAGNOSIS — R05 Cough: Secondary | ICD-10-CM

## 2013-08-20 DIAGNOSIS — J019 Acute sinusitis, unspecified: Secondary | ICD-10-CM

## 2013-08-20 DIAGNOSIS — R059 Cough, unspecified: Secondary | ICD-10-CM

## 2013-08-20 MED ORDER — AMOXICILLIN-POT CLAVULANATE 875-125 MG PO TABS: 1.0000 | ORAL_TABLET | Freq: Two times a day (BID) | ORAL | Status: DC

## 2013-08-20 MED ORDER — BENZONATATE 100 MG PO CAPS: ORAL_CAPSULE | ORAL | Status: DC

## 2013-08-20 NOTE — Telephone Encounter (Signed)
Pt states he would like another cough medication sent in. The tessalon pearles is 51 dollars. Please advise

## 2013-08-20 NOTE — Telephone Encounter (Signed)
Relevant patient education mailed to patient.  

## 2013-08-20 NOTE — Patient Instructions (Signed)
Start antibiotic. Eat yogurt daily at lunch or afternoon to help prevent diarrhea that can be caused by antibiotic. Start daily sinus rinses (Neilmed Sinus rinse) for at least 5-7 days. Take benzonatate capsules for cough. Fot tonsillitis, Salt water gargles twice daily (1/4tsp salt mixed w/1/4 warm water). Listerene gargles daily to decrease biofilm. Follow up in 7-10 days.  Sinusitis Sinusitis is redness, soreness, and swelling (inflammation) of the paranasal sinuses. Paranasal sinuses are air pockets within the bones of your face (beneath the eyes, the middle of the forehead, or above the eyes). In healthy paranasal sinuses, mucus is able to drain out, and air is able to circulate through them by way of your nose. However, when your paranasal sinuses are inflamed, mucus and air can become trapped. This can allow bacteria and other germs to grow and cause infection. Sinusitis can develop quickly and last only a short time (acute) or continue over a long period (chronic). Sinusitis that lasts for more than 12 weeks is considered chronic.  CAUSES  Causes of sinusitis include:  Allergies.  Structural abnormalities, such as displacement of the cartilage that separates your nostrils (deviated septum), which can decrease the air flow through your nose and sinuses and affect sinus drainage.  Functional abnormalities, such as when the small hairs (cilia) that line your sinuses and help remove mucus do not work properly or are not present. SYMPTOMS  Symptoms of acute and chronic sinusitis are the same. The primary symptoms are pain and pressure around the affected sinuses. Other symptoms include:  Upper toothache.  Earache.  Headache.  Bad breath.  Decreased sense of smell and taste.  A cough, which worsens when you are lying flat.  Fatigue.  Fever.  Thick drainage from your nose, which often is green and may contain pus (purulent).  Swelling and warmth over the affected  sinuses. DIAGNOSIS  Your caregiver will perform a physical exam. During the exam, your caregiver may:  Look in your nose for signs of abnormal growths in your nostrils (nasal polyps).  Tap over the affected sinus to check for signs of infection.  View the inside of your sinuses (endoscopy) with a special imaging device with a light attached (endoscope), which is inserted into your sinuses. If your caregiver suspects that you have chronic sinusitis, one or more of the following tests may be recommended:  Allergy tests.  Nasal culture A sample of mucus is taken from your nose and sent to a lab and screened for bacteria.  Nasal cytology A sample of mucus is taken from your nose and examined by your caregiver to determine if your sinusitis is related to an allergy. TREATMENT  Most cases of acute sinusitis are related to a viral infection and will resolve on their own within 10 days. Sometimes medicines are prescribed to help relieve symptoms (pain medicine, decongestants, nasal steroid sprays, or saline sprays).  However, for sinusitis related to a bacterial infection, your caregiver will prescribe antibiotic medicines. These are medicines that will help kill the bacteria causing the infection.  Rarely, sinusitis is caused by a fungal infection. In theses cases, your caregiver will prescribe antifungal medicine. For some cases of chronic sinusitis, surgery is needed. Generally, these are cases in which sinusitis recurs more than 3 times per year, despite other treatments. HOME CARE INSTRUCTIONS   Drink plenty of water. Water helps thin the mucus so your sinuses can drain more easily.  Use a humidifier.  Inhale steam 3 to 4 times a day (for example,  sit in the bathroom with the shower running).  Apply a warm, moist washcloth to your face 3 to 4 times a day, or as directed by your caregiver.  Use saline nasal sprays to help moisten and clean your sinuses.  Take over-the-counter or  prescription medicines for pain, discomfort, or fever only as directed by your caregiver. SEEK IMMEDIATE MEDICAL CARE IF:  You have increasing pain or severe headaches.  You have nausea, vomiting, or drowsiness.  You have swelling around your face.  You have vision problems.  You have a stiff neck.  You have difficulty breathing. MAKE SURE YOU:   Understand these instructions.  Will watch your condition.  Will get help right away if you are not doing well or get worse. Document Released: 04/15/2005 Document Revised: 07/08/2011 Document Reviewed: 04/30/2011 [facility] Patient Information 2014 [address], [place].  Tonsillitis Tonsillitis is an infection of the throat that causes the tonsils to become red, tender, and swollen. Tonsils are collections of lymphoid tissue at the back of the throat. Each tonsil has crevices (crypts). Tonsils help fight nose and throat infections and keep infection from spreading to other parts of the body for the first 18 months of life.  CAUSES Sudden (acute) tonsillitis is usually caused by infection with streptococcal bacteria. Long-lasting (chronic) tonsillitis occurs when the crypts of the tonsils become filled with pieces of food and bacteria, which makes it easy for the tonsils to become repeatedly infected. SYMPTOMS  Symptoms of tonsillitis include:  A sore throat, with possible difficulty swallowing.  White patches on the tonsils.  Fever.  Tiredness.  New episodes of snoring during sleep, when you did not snore before.  Small, foul-smelling, yellowish-white pieces of material (tonsilloliths) that you occasionally cough up or spit out. The tonsilloliths can also cause you to have bad breath. DIAGNOSIS Tonsillitis can be diagnosed through a physical exam. Diagnosis can be confirmed with the results of lab tests, including a throat culture. TREATMENT  The goals of tonsillitis treatment include the reduction of the severity and duration of  symptoms and prevention of associated conditions. Symptoms of tonsillitis can be improved with the use of steroids to reduce the swelling. Tonsillitis caused by bacteria can be treated with antibiotics. Usually, treatment with antibiotics is started before the cause of the tonsillitis is known. However, if it is determined that the cause is not bacterial, antibiotics will not treat the tonsillitis. If attacks of tonsillitis are severe and frequent, your caregiver may recommend surgery to remove the tonsils (tonsillectomy). HOME CARE INSTRUCTIONS   Rest as much as possible and get plenty of sleep.  Drink plenty of fluids. While the throat is very sore, eat soft foods or liquids, such as sherbet, soups, or instant breakfast drinks.  Eat frozen ice pops.  Gargle with a warm or cold liquid to help soothe the throat. Mix 1/4 teaspoon of salt and 1/4 teaspoon of baking soda in in 8 oz of water. SEEK MEDICAL CARE IF:   Large, tender lumps develop in your neck.  A rash develops.  A green, yellow-brown, or bloody substance is coughed up.  You are unable to swallow liquids or food for 24 hours.  You notice that only one of the tonsils is swollen. SEEK IMMEDIATE MEDICAL CARE IF:   You develop any new symptoms such as vomiting, severe headache, stiff neck, chest pain, or trouble breathing or swallowing.  You have severe throat pain along with drooling or voice changes.  You have severe pain, unrelieved with recommended  medications.  You are unable to fully open the mouth.  You develop redness, swelling, or severe pain anywhere in the neck.  You have a fever. MAKE SURE YOU:   Understand these instructions.  Will watch your condition.  Will get help right away if you are not doing well or get worse. Document Released: 01/23/2005 Document Revised: 12/16/2012 Document Reviewed: 10/02/2012 [facility] Patient Information 2014 [place], [place].

## 2013-08-20 NOTE — Telephone Encounter (Signed)
There is no substitute for tessalon pearles.

## 2013-08-20 NOTE — Telephone Encounter (Signed)
Sorry, we can not refill antibiotics.  Patient will need to be seen.

## 2013-08-20 NOTE — Telephone Encounter (Signed)
Patient states his cough has not gotten better & he is still coughing up green stuff. He is requesting a refill.

## 2013-08-20 NOTE — Progress Notes (Signed)
   Subjective:    Patient ID: Jack [name], male    DOB: 11/27/1945, 68 y.o.   MRN: [id number]  Cough This is a new (10 days) problem. The current episode started 1 to 4 weeks ago (2 weeks). The problem has been unchanged. The problem occurs every few minutes. The cough is productive of sputum (combination of dry & productive cough). Associated symptoms include ear congestion, nasal congestion, postnasal drip, a sore throat and wheezing. Pertinent negatives include no chest pain, chills, fever, headaches, hemoptysis, myalgias or shortness of breath. The symptoms are aggravated by lying down. He has tried oral steroids, prescription cough suppressant and a beta-agonist inhaler (albuterol, mucinex, azithromycin, prednisone, sinus rinses every 2-3 days) for the symptoms. The treatment provided no relief. DM      Review of Systems  Constitutional: Negative for fever, chills, activity change, appetite change and fatigue.  HENT: Positive for congestion, postnasal drip, sinus pressure and sore throat.        Ears "pop" when yawns   Respiratory: Positive for cough and wheezing. Negative for hemoptysis, chest tightness and shortness of breath.   Cardiovascular: Negative for chest pain.  Musculoskeletal: Negative for back pain and myalgias.  Neurological: Negative for headaches.       Objective:   Physical Exam  Vitals reviewed. Constitutional: He is oriented to person, place, and time. He appears well-developed and well-nourished. No distress.  HENT:  Head: Normocephalic and atraumatic.  Right Ear: External ear normal.  Left Ear: External ear normal.  Petechiae posterior pharynx, white exudate tonsils, tonsils +2, purulent nasal d/c, effusion L TM, bones visible, mild retraction.  Eyes: Conjunctivae are normal. Right eye exhibits no discharge. Left eye exhibits no discharge.  Neck: Normal range of motion. Neck supple. No thyromegaly present.  Cardiovascular: Normal rate and regular rhythm.     Murmur (loud holosystolic murmur.) heard. Pulmonary/Chest: Effort normal and breath sounds normal. No respiratory distress. He has no wheezes. He has no rales.  Lymphadenopathy:    He has no cervical adenopathy.  Neurological: He is alert and oriented to person, place, and time.  Skin: Skin is warm and dry.  Psychiatric: He has a normal mood and affect. Thought content normal.          Assessment & Plan:  1. Acute sinus infection 2 weeks - amoxicillin-clavulanate (AUGMENTIN) 875-125 MG per tablet; Take 1 tablet by mouth 2 (two) times daily.  Dispense: 14 tablet; Refill: 0 Daily sinus rinses  2. Cough Most likely combination of post-nasal drip & bronchitis or post viral pneumonitis - benzonatate (TESSALON) 100 MG capsule; Take 1-2 capsules po up to 3 times daily PRN cough  Dispense: 60 capsule; Refill: 0 F/u 7-10 days.

## 2013-08-20 NOTE — Progress Notes (Signed)
Pre visit review using our clinic review tool, if applicable. No additional management support is needed unless otherwise documented below in the visit note. 

## 2013-08-24 ENCOUNTER — Other Ambulatory Visit: Payer: Self-pay | Admitting: *Deleted

## 2013-08-24 DIAGNOSIS — J019 Acute sinusitis, unspecified: Secondary | ICD-10-CM

## 2013-08-25 NOTE — Telephone Encounter (Signed)
Pt should have 2 more days of augmentin. I prescribed 7 days. He filled it 5 da.

## 2013-08-25 NOTE — Telephone Encounter (Signed)
Patient is requesting refill for Augmentin. Do you wish to refill?

## 2013-08-30 ENCOUNTER — Telehealth: Payer: Self-pay | Admitting: *Deleted

## 2013-08-30 NOTE — Telephone Encounter (Signed)
pls advise pt augmentin is one of the strongest antibiotics we have. If he is still having sinus congestion, he may need to see ENT, if he has productive cough, I will order chest xray.

## 2013-08-30 NOTE — Telephone Encounter (Signed)
Office has received several rx requests for amox-clav 875 mg tablets. Called pt to inform him that he needs to come in for f/u appt to see if pt has more going on then chest congestion. Pt refused to come in for f/u, stating that he has been to office 3 times for same symptoms and he is not coming back. Pt is requesting stronger med than amoxicillin. Please advise?

## 2013-08-31 ENCOUNTER — Other Ambulatory Visit: Payer: Self-pay | Admitting: Nurse Practitioner

## 2013-08-31 ENCOUNTER — Ambulatory Visit (HOSPITAL_BASED_OUTPATIENT_CLINIC_OR_DEPARTMENT_OTHER)
Admission: RE | Admit: 2013-08-31 | Discharge: 2013-08-31 | Disposition: A | Discharge status: Home / Self Care | Payer: Medicare Other | Source: Ambulatory Visit | Attending: Nurse Practitioner | Admitting: Nurse Practitioner

## 2013-08-31 DIAGNOSIS — R05 Cough: Secondary | ICD-10-CM

## 2013-08-31 DIAGNOSIS — R0602 Shortness of breath: Secondary | ICD-10-CM | POA: Insufficient documentation

## 2013-08-31 DIAGNOSIS — R059 Cough, unspecified: Secondary | ICD-10-CM | POA: Insufficient documentation

## 2013-08-31 NOTE — Telephone Encounter (Signed)
Spoke to pt who stated that he is still having sinus congestion and productive cough. Pt woud like to get chest x-ray at [facility].

## 2013-08-31 NOTE — Telephone Encounter (Signed)
pls call pt: Advise Chest xray is clear. He is probably coughing due to post-nasal drip. Ask if he is using sinus rinses as I prescribed. This will clear nasal mucous which means less dripping on back of throat & less cough.

## 2013-08-31 NOTE — Progress Notes (Signed)
Pt notified that he can walk in for ct.

## 2013-09-01 NOTE — Telephone Encounter (Signed)
Patient notified of results. Pt stated that he is using nedi pot and nasal spray. Pt also stated that he feels better today then he did at the beginning of the week.

## 2013-09-01 NOTE — Telephone Encounter (Signed)
Good, sounds like he is getting better.

## 2013-09-16 ENCOUNTER — Encounter (INDEPENDENT_AMBULATORY_CARE_PROVIDER_SITE_OTHER): Payer: Self-pay

## 2013-09-16 ENCOUNTER — Ambulatory Visit (INDEPENDENT_AMBULATORY_CARE_PROVIDER_SITE_OTHER): Payer: Medicare Other | Admitting: Neurology

## 2013-09-16 ENCOUNTER — Encounter: Payer: Self-pay | Admitting: Neurology

## 2013-09-16 VITALS — BP 136/75 | HR 68 | Temp 97.8°F | Ht 68.0 in | Wt 280.0 lb

## 2013-09-16 DIAGNOSIS — G4733 Obstructive sleep apnea (adult) (pediatric): Secondary | ICD-10-CM

## 2013-09-16 DIAGNOSIS — E669 Obesity, unspecified: Secondary | ICD-10-CM

## 2013-09-16 NOTE — Patient Instructions (Addendum)
Based on your symptoms and your exam and of course your prior history I believe you are at risk for obstructive sleep apnea or OSA, and I think we should proceed with a sleep study to determine whether you do or do not have OSA and how severe it is. If you have more than mild OSA, I want you to consider treatment with CPAP. Please remember, the risks and ramifications of moderate to severe obstructive sleep apnea or OSA are: Cardiovascular disease, including congestive heart failure, stroke, difficult to control hypertension, arrhythmias, and even type 2 diabetes has been linked to untreated OSA. Sleep apnea causes disruption of sleep and sleep deprivation in most cases, which, in turn, can cause recurrent headaches, problems with memory, mood, concentration, focus, and vigilance. Most people with untreated sleep apnea report excessive daytime sleepiness, which can affect their ability to drive. Please do not drive if you feel sleepy.  I will see you back after your sleep study to go over the test results and where to go from there. We will call you after your sleep study and to set up an appointment at the time.

## 2013-09-16 NOTE — Progress Notes (Signed)
Subjective:    Patient ID: Jack [name] is a 68 y.o. male.  HPI    Star Age, MD, PhD [facility] [address] [place],  [address]  Dear Jack [name],   I saw your patient, Jack [name], upon your kind request in my neurologic clinic today for initial consultation of his sleep disorder, in particular, his prior Dx of obstructive sleep apnea. The patient is unaccompanied today. As you know, Jack [name] is a very pleasant 68 year old right-handed gentleman with an underlying medical history of hypertension, subaortic stenosis, hyperlipidemia, diabetes, gout, morbid obesity esophageal stricture, status post esophageal to palpation in October 2014, who was previously diagnosed with obstructive sleep apnea and had a sleep study on 04/19/2010 [facility]. I reviewed the test results. Sleep efficiency was 67.98%, REM latency was 75 minutes. His total AHI was 24.6 per hour, rising to 40 per hour in REM sleep. Average oxygen saturation was 93%, and nadir was 81%. He was noted to snore loudly. His periodic limb movement index was elevated at 21.1 per hour with an associated arousal index of 2.9 per hour. He was advised to have a CPAP titration study. He had this test on 04/24/2010 and I reviewed the test results as well. Sleep efficiency was 73.3%, REM latency was 144 minutes. Average oxygen saturation was 94%, nadir was 80%, he was titrated from 5-13 cm of water pressure on CPAP on the final pressures AHI was 2.02 per hour. He was placed on CPAP of 13 cm. He tried CPAP for about 2 week, tried about 3 masks, but could not tolerate it and felt bloated and smothered by air. He has a FHx of OSA in a cousin. He quit smoking in 1999, he drinks EtOH rarely. He drinks coffee, most of the time half/half, about 2-3 cups in the morning and about 2 cans of diet coke/Dr. Malachi Bonds.  He snores and makes puffing sounds. He has nocturia x 2. He has no AM HAs.   His cardiac workup has included an echocardiogram on 01/08/2012 which showed marked LVH, left ventricular outflow obstruction but no change from previous test result. EKG from 08/26/2012 showed left atrial enlargement, left axis deviation, left ventricular hypertrophy with repolarization abnormality but no change from 01/15/2012. He does endorse leg aching, but also has gout. He twitches in sleep. He has a BT of 11 PM and sometimes it takes him 1-2 hours to go to sleep. Sometimes he falls asleep in 10-15 minutes. His wife sleeps in a separate bedroom. He watches TV in a recliner and often dozes off. He does not take a nap. He has an ESS of 6/24. He has lost some weight and gained it back and weighs about the same as in 2011. He wakes up without an alarm between 5:30 and 6 AM, and he feels rested for about an hour, but then gets tired. He is outside a lot. He does not take a nap.   His Past Medical History Is Significant For: Past Medical History  Diagnosis Date  . Diabetes mellitus     type 2- non insulin-requiring  . Hyperlipidemia   . Hypertension     severe LVH on echo  . OSA (obstructive sleep apnea)     Could not tolerate CPAP, not surgical candidate per Dr. Benjamine Mola  . Morbid obesity   . Gout   . GERD (gastroesophageal reflux disease)     EDG 04-03-01, + distal esophageal stricture dilation  .  Fatty liver u/s and CT 2009    w/mildly elevated transaminases; u/s 12/2012 reconfirmed dx.  Hep B and C testing negative.  Marland Kitchen Dyspnea     Cardiology w/u unrevealing except LVOT obstruction: suspect dyspnea due to morbid obesity and OSA  . Subvalvular aortic stenosis 2012    LVOT obstruction stable on echo 01/09/12--without evidence of HOCM.  Marland Kitchen Carotid bruit 05/28/10    Tortuosity of carotids on doppler u/s, no stenosis.  . Stricture and stenosis of esophagus   . Hiatal hernia     His Past Surgical History Is Significant For: Past Surgical History  Procedure Laterality Date  . Colonoscopy  x 2     Dr. Sharlett Iles w/ [facility]  . Tee without cardioversion  05/2010    Dr. Einar Gip; severe LVH, no valvular abnormalities  . Cardiovascular stress test  2006 and 04/27/10    Normal stress nuclear study, normal EF  . Transthoracic echocardiogram  05/28/10    Normal LVEF, LVOT obstruction, lateral wall hypokinesis  . Transesophageal echocardiogram  06/06/10    LVOT obst with 135 mm Hg PG.  Not HOCM.   No mitral regurg..  Repeat echo 01/09/12 no change.  . Nasal sinus surgery      His Family History Is Significant For: Family History  Problem Relation Age of Onset  . Prostate cancer Father     Prostate  . Hypertension Neg Hx   . Coronary artery disease Neg Hx   . Colon cancer Neg Hx   . Stomach cancer Neg Hx   . Rectal cancer Neg Hx   . Breast cancer Sister   . Diabetes Mother   . Diabetes Maternal Aunt   . Breast cancer Maternal Aunt     His Social History Is Significant For: History   Social History  . Marital Status: Married    Spouse Name: N/A    Number of Children: 1  . Years of Education: N/A   Occupational History  . retired    Social History Main Topics  . Smoking status: Former Smoker -- 0.50 packs/day for 40 years    Types: Cigarettes, Cigars    Quit date: 04/29/1998  . Smokeless tobacco: Never Used     Comment: 4 cigars  . Alcohol Use: No     Comment: occasionally  . Drug Use: No  . Sexual Activity: None   Other Topics Concern  . None   Social History Narrative   Married, 1 son, 1 granddaughter.   Retired from [employer] 2000.   Hobbies: trading farm equipment.  Used to be a Environmental manager.   Tobacco x many years, quit @ 2005   No ETOH or drugs.   No regular exercise.Smoking Status:  quit             His Allergies Are:  No Known Allergies:   His Current Medications Are:  Outpatient Encounter Prescriptions as of 09/16/2013  Medication Sig  . albuterol (VENTOLIN HFA) 108 (90 BASE) MCG/ACT inhaler Inhale 2 puffs into the  lungs every 4 (four) hours as needed for wheezing.  Marland Kitchen allopurinol (ZYLOPRIM) 300 MG tablet Take 1 tablet (300 mg total) by mouth daily. For gout  . amoxicillin-clavulanate (AUGMENTIN) 875-125 MG per tablet Take 1 tablet by mouth 2 (two) times daily.  Marland Kitchen aspirin 325 MG tablet Take 325 mg by mouth daily.    Marland Kitchen azithromycin (ZITHROMAX) 250 MG tablet 2 tabs po qd x 1d, then 1 tab po qd x  4d  . benzonatate (TESSALON) 100 MG capsule Take 1-2 capsules po up to 3 times daily PRN cough  . Docusate Calcium (STOOL SOFTENER PO) Take 1 capsule by mouth at bedtime.  . fluticasone (FLONASE) 50 MCG/ACT nasal spray Place 2 sprays into the nose daily.  . furosemide (LASIX) 20 MG tablet Take 1 tablet by mouth Once daily as needed.  Marland Kitchen glipiZIDE (GLUCOTROL XL) 5 MG 24 hr tablet Take 1 tablet (5 mg total) by mouth daily.  Marland Kitchen glucose blood (ONE TOUCH ULTRA TEST) test strip Use as instructed to check blood sugar.  DX 250.00  . HYDROcodone-homatropine (HYCODAN) 5-1.5 MG/5ML syrup 1-2 tsp po q6h as needed for cough  . metFORMIN (GLUCOPHAGE) 1000 MG tablet Take 1 tablet (1,000 mg total) by mouth 2 (two) times daily with a meal.  . metoprolol (LOPRESSOR) 50 MG tablet Take 50 mg by mouth 2 (two) times daily. As per Dr. Einar Gip  . ONE TOUCH LANCETS MISC Use as directed to check blood sugar.  DX 250.00  . pantoprazole (PROTONIX) 40 MG tablet Take 1 tablet (40 mg total) by mouth daily.  . potassium chloride SA (K-DUR,KLOR-CON) 20 MEQ tablet Take 1 tablet by mouth Once daily as needed.  . pravastatin (PRAVACHOL) 80 MG tablet Take 1 tablet (80 mg total) by mouth daily.  . predniSONE (DELTASONE) 20 MG tablet 2 tabs po qd x 5d, then 1 tab po qd x 5d  . temazepam (RESTORIL) 15 MG capsule Take 15 mg by mouth as needed.   . verapamil (CALAN-SR) 120 MG CR tablet Take 1 tablet by mouth Daily.  :  Review of Systems:  Out of a complete 14 point review of systems, all are reviewed and negative with the exception of these symptoms as listed  below:   Review of Systems  Constitutional: Positive for fatigue.  HENT: Negative.   Eyes: Negative.   Respiratory: Positive for cough and shortness of breath.   Cardiovascular: Positive for leg swelling.  Gastrointestinal: Negative.   Endocrine: Positive for heat intolerance.  Genitourinary: Negative.   Musculoskeletal: Positive for myalgias.  Skin: Negative.   Allergic/Immunologic: Positive for environmental allergies.  Neurological: Positive for weakness.  Hematological: Negative.   Psychiatric/Behavioral: Positive for sleep disturbance.    Objective:  Neurologic Exam  Physical Exam Physical Examination:   Filed Vitals:   09/16/13 0833  BP: 136/75  Pulse: 68  Temp: 97.8 F (36.6 C)    General Examination: The patient is a very pleasant 68 y.o. male in no acute distress. He appears well-developed and well-nourished and well groomed. He is obese.   HEENT: Normocephalic, atraumatic, pupils are equal, round and reactive to light and accommodation. Funduscopic exam is normal with sharp disc margins noted. Extraocular tracking is good without limitation to gaze excursion or nystagmus noted. Normal smooth pursuit is noted. Hearing is grossly intact. Tympanic membranes are clear bilaterally. Face is symmetric with normal facial animation and normal facial sensation. Speech is clear with no dysarthria noted. There is no hypophonia. There is no lip, neck/head, jaw or voice tremor. Neck is supple with full range of passive and active motion. There are no carotid bruits on auscultation. Oropharynx exam reveals: moderate mouth dryness, adequate dental hygiene and moderate airway crowding, due to redundant soft palate. Mallampati is class III. Tongue protrudes centrally and palate elevates symmetrically. Tonsils are not visualized. Neck size is 18.25 inches.   Chest: Clear to auscultation without wheezing, rhonchi or crackles noted.  Heart: S1+S2+0, regular and normal  without murmurs,  rubs or gallops noted.   Abdomen: Soft, non-tender and non-distended with normal bowel sounds appreciated on auscultation.  Extremities: There is trace pitting edema in the distal lower extremities bilaterally. Pedal pulses are intact.  Skin: Warm and dry without trophic changes noted. There are no varicose veins.  Musculoskeletal: exam reveals no obvious joint deformities, tenderness or joint swelling or erythema.   Neurologically:  Mental status: The patient is awake, alert and oriented in all 4 spheres. His immediate and remote memory, attention, language skills and fund of knowledge are appropriate. There is no evidence of aphasia, agnosia, apraxia or anomia. Speech is clear with normal prosody and enunciation. Thought process is linear. Mood is normal and affect is normal.  Cranial nerves II - XII are as described above under HEENT exam. In addition: shoulder shrug is normal with equal shoulder height noted. Motor exam: Normal bulk, strength and tone is noted. There is no drift, tremor or rebound. Romberg shows minimal sway.  Reflexes are 2+ throughout. Babinski: Toes are flexor bilaterally. Fine motor skills and coordination: intact with normal finger taps, normal hand movements, normal rapid alternating patting, normal foot taps and normal foot agility.  Cerebellar testing: No dysmetria or intention tremor on finger to nose testing. Heel to shin is unremarkable bilaterally. There is no truncal or gait ataxia.  Sensory exam: intact to light touch, pinprick, vibration, temperature sense in the upper and lower extremities, with the exception of mild decrease in vibration sense in the distal LEs b/l.  Gait, station and balance: He stands easily. No veering to one side is noted. No leaning to one side is noted. Posture is age-appropriate and stance is narrow based. Gait shows normal stride length and normal pace. No problems turning are noted. He turns en bloc. Tandem walk is unremarkable. Intact  toe and heel stance is noted.               Assessment and Plan :  In summary, Jack [name] is a very pleasant 68 y.o.-year old male with a history and physical exam concerning for obstructive sleep apnea (OSA). I had a long chat with the patient and wife about my findings and the diagnosis of OSA, its prognosis and treatment options. We talked about medical treatments, surgical interventions and non-pharmacological approaches. I explained in particular the risks and ramifications of untreated moderate to severe OSA, especially with respect to developing cardiovascular disease down the Road, including congestive heart failure, difficult to treat hypertension, cardiac arrhythmias, or stroke. Even type 2 diabetes has, in part, been linked to untreated OSA. Symptoms of untreated OSA include daytime sleepiness, memory problems, mood irritability and mood disorder such as depression and anxiety, lack of energy, as well as recurrent headaches, especially morning headaches. We talked about trying to maintain a healthy lifestyle in general, as well as the importance of weight control. I encouraged the patient to eat healthy, exercise daily and keep well hydrated, to keep a scheduled bedtime and wake time routine, to not skip any meals and eat healthy snacks in between meals. I advised the patient not to drive when feeling sleepy. I recommended the following at this time: sleep study with potential positive airway pressure titration. We will be mindful of his prior negative experience with CPAP.  I explained the sleep test procedure to the patient and also outlined possible surgical and non-surgical treatment options of OSA, including the use of a custom-made dental device (which would require a referral to a  specialist dentist or oral surgeon), upper airway surgical options, such as pillar implants, radiofrequency surgery, tongue base surgery, and UPPP (which would involve a referral to an ENT surgeon). Rarely, jaw  surgery such as mandibular advancement may be considered.  I also explained the CPAP treatment option to the patient, who indicated that he would be willing to try CPAP again if the need arises. I explained the importance of being compliant with PAP treatment, not only for insurance purposes but primarily to improve His symptoms, and for the patient's long term health benefit, including to reduce His cardiovascular risks. I answered all their questions today and the patient and his wife were in agreement. I would like to see him back after the sleep study is completed and encouraged him to call with any interim questions, concerns, problems or updates.   Thank you very much for allowing me to participate in the care of this nice patient. If I can be of any further assistance to you please do not hesitate to call me at [phone].  Sincerely,   Star Age, MD, PhD

## 2013-10-26 ENCOUNTER — Other Ambulatory Visit: Payer: Self-pay | Admitting: Family Medicine

## 2013-11-15 ENCOUNTER — Encounter: Payer: Self-pay | Admitting: Family Medicine

## 2013-12-08 ENCOUNTER — Ambulatory Visit: Payer: Medicare Other | Admitting: Family Medicine

## 2013-12-29 ENCOUNTER — Telehealth: Payer: Self-pay | Admitting: Family Medicine

## 2013-12-29 MED ORDER — PREDNISONE 20 MG PO TABS: ORAL_TABLET | ORAL | Status: DC

## 2013-12-29 NOTE — Telephone Encounter (Signed)
Pt requesting prednisone refill because he states he is having a flare up of gout.  Please advise.

## 2013-12-29 NOTE — Telephone Encounter (Signed)
Will send prednisone rx to pharmacy. Pls make sure he is taking his allopurinol daily.-thx

## 2013-12-30 NOTE — Telephone Encounter (Signed)
Patient states that he is taking allopurinol daily.  Patient wondered if some of the medications he is on is causing the flares of gout?  Please advise.    ( okay to leave detailed message on cell )

## 2013-12-30 NOTE — Telephone Encounter (Signed)
LMOM for pt to CB.  

## 2013-12-31 NOTE — Telephone Encounter (Signed)
Pt's wife aware.  Okay per DPR> 

## 2013-12-31 NOTE — Telephone Encounter (Signed)
I reviewed his med list and the only one that he is on that may increase his risk of gout flares is lasix, which he takes prn according to instructions on his med list.  No sense in changing this med, though, b/c any diuretic can increase the risk of gout flares.  Reassure him that nothing needs to be changed.-thx

## 2014-02-02 ENCOUNTER — Telehealth: Payer: Self-pay | Admitting: Family Medicine

## 2014-02-02 ENCOUNTER — Ambulatory Visit (INDEPENDENT_AMBULATORY_CARE_PROVIDER_SITE_OTHER): Payer: Medicare Other

## 2014-02-02 DIAGNOSIS — Z23 Encounter for immunization: Secondary | ICD-10-CM

## 2014-02-02 NOTE — Telephone Encounter (Signed)
Patient came in today for flu shot.  He was wondering if he was due for any other immunizations.  Looks like he is due for pneumonia.  Please advise.

## 2014-02-03 NOTE — Telephone Encounter (Signed)
No.  There are no vaccines that he needs at this time.-thx

## 2014-02-03 NOTE — Telephone Encounter (Signed)
Patients wife aware

## 2014-02-09 ENCOUNTER — Telehealth: Payer: Self-pay

## 2014-02-09 NOTE — Telephone Encounter (Signed)
No eye exam this year.  Unsure if he had one last year.  Plans to schedule soon.

## 2014-04-11 ENCOUNTER — Other Ambulatory Visit: Payer: Self-pay | Admitting: Family Medicine

## 2014-04-11 ENCOUNTER — Other Ambulatory Visit: Payer: Self-pay | Admitting: Gastroenterology

## 2014-04-11 NOTE — Telephone Encounter (Signed)
Please contact patient for appointment.  He was due for annual wellness exam in August of this year.  Let him know I sent in 90 days of his RX's but I won't be able to refill anymore until he is seen.

## 2014-04-19 ENCOUNTER — Encounter: Payer: Self-pay | Admitting: Family Medicine

## 2014-04-19 ENCOUNTER — Ambulatory Visit (INDEPENDENT_AMBULATORY_CARE_PROVIDER_SITE_OTHER): Payer: Medicare Other | Admitting: Family Medicine

## 2014-04-19 VITALS — BP 131/76 | HR 58 | Temp 97.4°F | Ht 68.0 in | Wt 288.0 lb

## 2014-04-19 DIAGNOSIS — E118 Type 2 diabetes mellitus with unspecified complications: Secondary | ICD-10-CM

## 2014-04-19 DIAGNOSIS — J4540 Moderate persistent asthma, uncomplicated: Secondary | ICD-10-CM

## 2014-04-19 DIAGNOSIS — I10 Essential (primary) hypertension: Secondary | ICD-10-CM

## 2014-04-19 DIAGNOSIS — E785 Hyperlipidemia, unspecified: Secondary | ICD-10-CM

## 2014-04-19 DIAGNOSIS — Z125 Encounter for screening for malignant neoplasm of prostate: Secondary | ICD-10-CM

## 2014-04-19 HISTORY — DX: Moderate persistent asthma, uncomplicated: J45.40

## 2014-04-19 LAB — BASIC METABOLIC PANEL
BUN: 13 mg/dL (ref 6–23)
CHLORIDE: 107 meq/L (ref 96–112)
CO2: 26 mEq/L (ref 19–32)
Calcium: 9.4 mg/dL (ref 8.4–10.5)
Creatinine, Ser: 1 mg/dL (ref 0.4–1.5)
GFR: 76.52 mL/min (ref >60.00)
Glucose, Bld: 134 mg/dL — ABNORMAL HIGH (ref 70–99)
Potassium: 4.6 mEq/L (ref 3.5–5.1)
SODIUM: 141 meq/L (ref 135–145)

## 2014-04-19 LAB — PSA, MEDICARE: PSA: 0.71 ng/mL (ref 0.10–4.00)

## 2014-04-19 LAB — HEMOGLOBIN A1C: HEMOGLOBIN A1C: 7.1 % — AB (ref 4.6–6.5)

## 2014-04-19 LAB — TSH: TSH: 3.08 u[IU]/mL (ref 0.35–4.50)

## 2014-04-19 MED ORDER — VERAPAMIL HCL ER 120 MG PO TBCR: 120.0000 mg | EXTENDED_RELEASE_TABLET | Freq: Every day | ORAL | Status: DC

## 2014-04-19 MED ORDER — BUDESONIDE-FORMOTEROL FUMARATE 160-4.5 MCG/ACT IN AERO: 2.0000 | INHALATION_SPRAY | Freq: Two times a day (BID) | RESPIRATORY_TRACT | Status: DC

## 2014-04-19 MED ORDER — METOPROLOL TARTRATE 50 MG PO TABS: 50.0000 mg | ORAL_TABLET | Freq: Two times a day (BID) | ORAL | Status: DC

## 2014-04-19 NOTE — Progress Notes (Signed)
OFFICE NOTE  04/19/2014  CC:  Chief Complaint  Patient presents with  . Follow-up    4 months   HPI: Patient is a 68 y.o. Caucasian male who is here for 4 mo f/u DM 2, HTN, hyperlipidemia. Doing well.  Asks for restart of symbicort in prep for "bronchitis season" b/c in the past this has helped signif when used bid. Not much help from albuterol, though, historically.  Not compliant with glucose monitoring.   Never feels hypoglycemia. No BP monitoring at home.   Pertinent PMH:  Past medical, surgical, social, and family history reviewed and no changes are noted since last office visit.  MEDS: Not taking augmentin or zithromax or prednisone listed below Outpatient Prescriptions Prior to Visit  Medication Sig Dispense Refill  . albuterol (VENTOLIN HFA) 108 (90 BASE) MCG/ACT inhaler Inhale 2 puffs into the lungs every 4 (four) hours as needed for wheezing. 1 Inhaler 0  . allopurinol (ZYLOPRIM) 300 MG tablet Take 1 tablet (300 mg  total) by mouth daily. For  gout 90 tablet 0  . aspirin 325 MG tablet Take 325 mg by mouth daily.      . furosemide (LASIX) 20 MG tablet Take 1 tablet by mouth Once daily as needed.    Marland Kitchen GLIPIZIDE XL 5 MG 24 hr tablet Take 1 tablet by mouth  daily 90 tablet 0  . glucose blood (ONE TOUCH ULTRA TEST) test strip Use as instructed to check blood sugar.  DX 250.00 100 each prn  . metFORMIN (GLUCOPHAGE) 1000 MG tablet Take 1 tablet (1,000 mg  total) by mouth 2 (two)  times daily with meal. 180 tablet 0  . metoprolol (LOPRESSOR) 50 MG tablet Take 50 mg by mouth 2 (two) times daily. As per Dr. Einar Gip    . ONE TOUCH LANCETS MISC Use as directed to check blood sugar.  DX 250.00 200 each prn  . pantoprazole (PROTONIX) 40 MG tablet Take 1 tablet (40 mg total) by mouth daily. 90 tablet 3  . potassium chloride SA (K-DUR,KLOR-CON) 20 MEQ tablet Take 1 tablet by mouth Once daily as needed.    . pravastatin (PRAVACHOL) 80 MG tablet Take 1 tablet by mouth  daily 90 tablet 0   . verapamil (CALAN-SR) 120 MG CR tablet Take 1 tablet by mouth Daily.    Marland Kitchen amoxicillin-clavulanate (AUGMENTIN) 875-125 MG per tablet Take 1 tablet by mouth 2 (two) times daily. (Patient not taking: Reported on 04/19/2014) 14 tablet 0  . azithromycin (ZITHROMAX) 250 MG tablet 2 tabs po qd x 1d, then 1 tab po qd x 4d (Patient not taking: Reported on 04/19/2014) 6 each 0  . benzonatate (TESSALON) 100 MG capsule Take 1-2 capsules po up to 3 times daily PRN cough (Patient not taking: Reported on 04/19/2014) 60 capsule 0  . Docusate Calcium (STOOL SOFTENER PO) Take 1 capsule by mouth at bedtime.    . fluticasone (FLONASE) 50 MCG/ACT nasal spray Place 2 sprays into the nose daily. (Patient not taking: Reported on 04/19/2014) 16 g 2  . HYDROcodone-homatropine (HYCODAN) 5-1.5 MG/5ML syrup 1-2 tsp po q6h as needed for cough (Patient not taking: Reported on 04/19/2014) 120 mL 0  . predniSONE (DELTASONE) 20 MG tablet 2 tabs po qd x 5d (Patient not taking: Reported on 04/19/2014) 10 tablet 0  . temazepam (RESTORIL) 15 MG capsule Take 15 mg by mouth as needed.      No facility-administered medications prior to visit.    PE: Blood pressure 131/76, pulse 58,  temperature 97.4 F (36.3 C), temperature source Temporal, height 5\' 8"  (1.727 m), weight 288 lb (130.636 kg), SpO2 95 %. Gen: Alert, well appearing.  Patient is oriented to person, place, time, and situation.. Rectal exam: negative without mass, lesions or tenderness, PROSTATE EXAM: smooth and symmetric without nodules or tenderness.   LAB: none today RECENT: Lab Results  Component Value Date   HGBA1C 6.4 08/03/2013     Chemistry      Component Value Date/Time   NA 140 08/03/2013 0833   K 4.3 08/03/2013 0833   CL 108 08/03/2013 0833   CO2 25 08/03/2013 0833   BUN 16 08/03/2013 0833   CREATININE 1.1 08/03/2013 0833      Component Value Date/Time   CALCIUM 9.5 08/03/2013 0833   ALKPHOS 63 08/03/2013 0833   AST 38* 08/03/2013 0833   ALT  38 08/03/2013 0833   BILITOT 0.7 08/03/2013 0833     Lab Results  Component Value Date   CHOL 134 08/03/2013   HDL 38.20* 08/03/2013   LDLCALC 69 08/03/2013   TRIG 136.0 08/03/2013   CHOLHDL 4 08/03/2013   Lab Results  Component Value Date   TSH 2.67 10/29/2012   Lab Results  Component Value Date   PSA 0.73 06/08/2012   PSA normal 12/28/2009     IMPRESSION AND PLAN:  1) DM 2. Due for HbA1c.  Otherwise monitoring is UTD. Eye exam last done 2 wks ago by Dr. Gershon Crane. TSH screening today.  2) HTN; The current medical regimen is effective;  continue present plan and medications. Lytes/cr today  3) Hyperlipidemia; LDL 69 in April this year.   Repeat FLP at next f/u visit in 4 mo.  4) Prostate ca screening:  DRE normal today.  PSA drawn today.  5) Asthma, mild/mod persistent: he is coming into a season when he finds symbicort particularly helpful so I have restarted him on this med today: 2 p bid.  Pt is UTD on all vaccines.  An After Visit Summary was printed and given to the patient.  FOLLOW UP: 35mo

## 2014-04-19 NOTE — Progress Notes (Signed)
Pre visit review using our clinic review tool, if applicable. No additional management support is needed unless otherwise documented below in the visit note. 

## 2014-04-20 ENCOUNTER — Telehealth: Payer: Self-pay | Admitting: Family Medicine

## 2014-04-20 NOTE — Telephone Encounter (Signed)
Patient aware. Voiced understanding.

## 2014-04-20 NOTE — Telephone Encounter (Signed)
emmi mailed  °

## 2014-04-20 NOTE — Telephone Encounter (Signed)
Pls notify pt that labs were all good except HbA1c was up to 7.1 % compared to 6.4% when last checked 8 months ago.   I think we can leave his diabetic meds as-is but I recommend he get strict with his diabetic diet and increase activity level and if A1c is up again at next f/u in 4 mo we'll need to increase meds.  ---thx

## 2014-04-23 ENCOUNTER — Encounter: Payer: Self-pay | Admitting: *Deleted

## 2014-05-29 IMAGING — CR DG CHEST 2V
2 series · 2 of 2 positions shown · non-contrast
Comparison: 05/18/2012

CLINICAL DATA: Chronic cough.

CHEST - 2 VIEW

[view not recorded (1 of 2)]
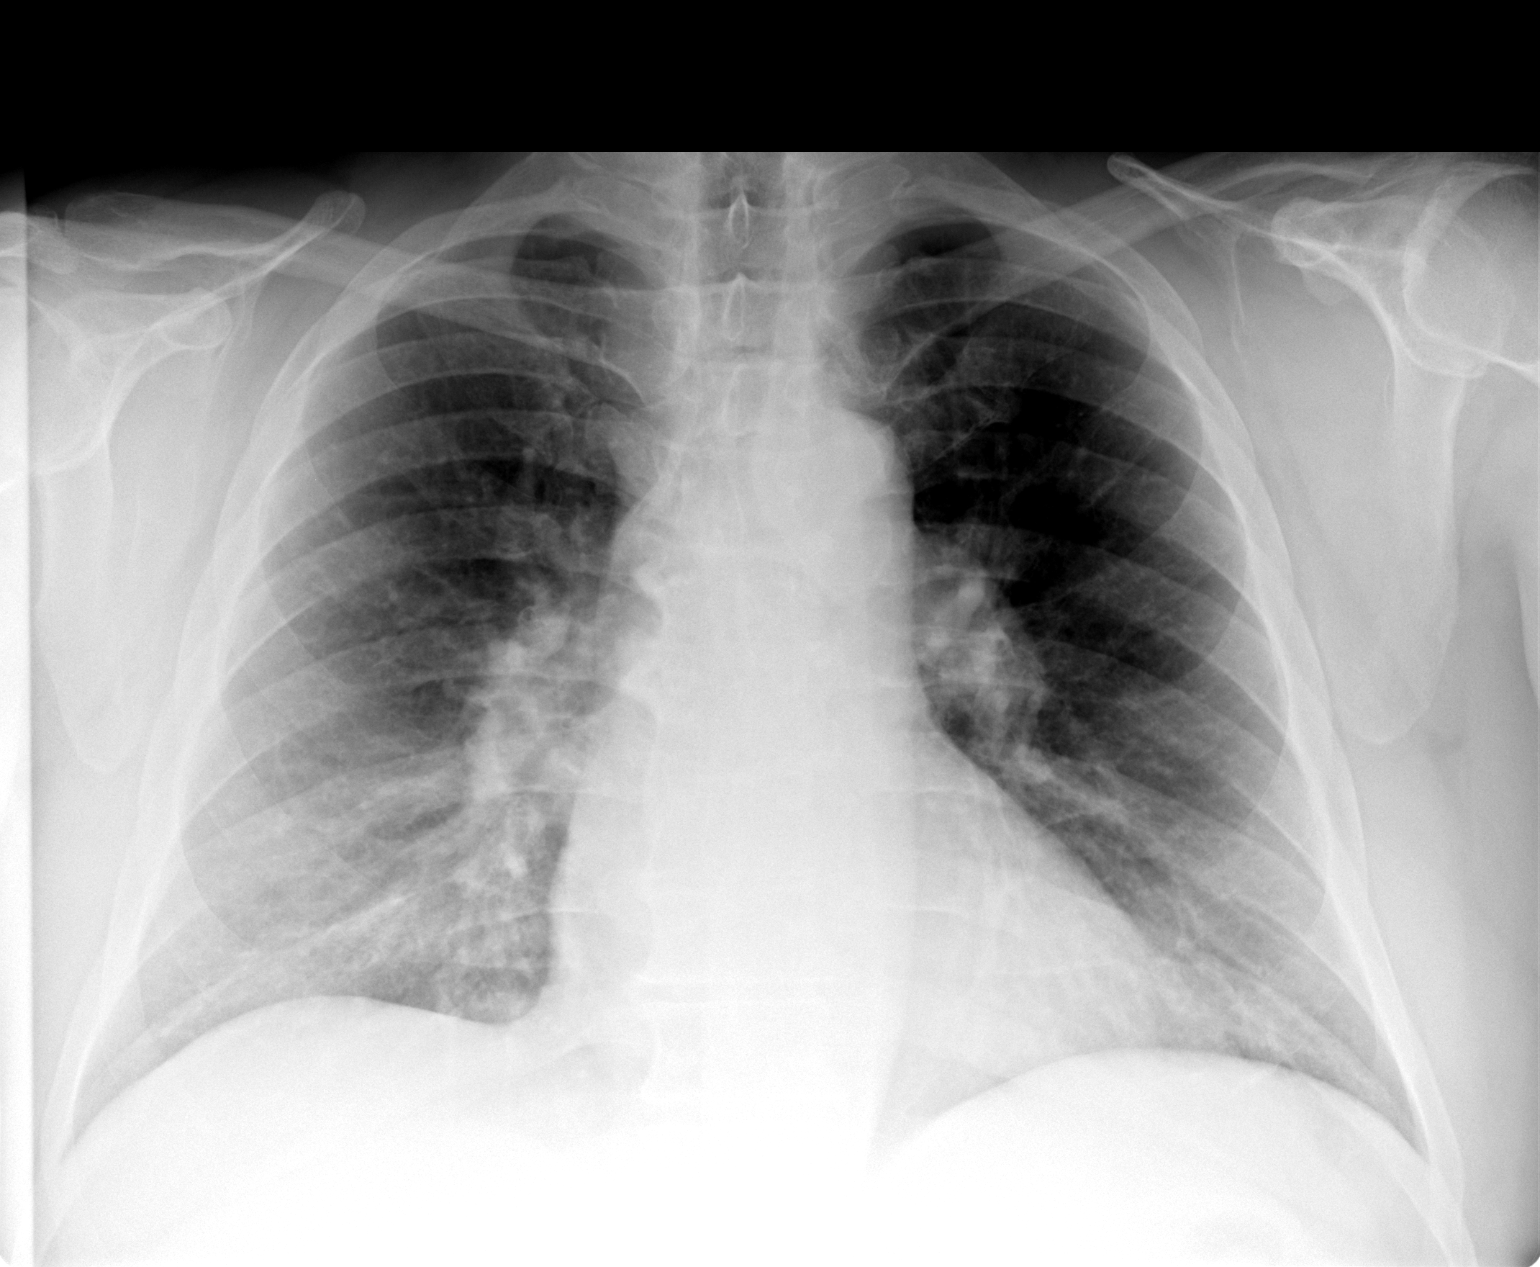

[view not recorded (2 of 2)]
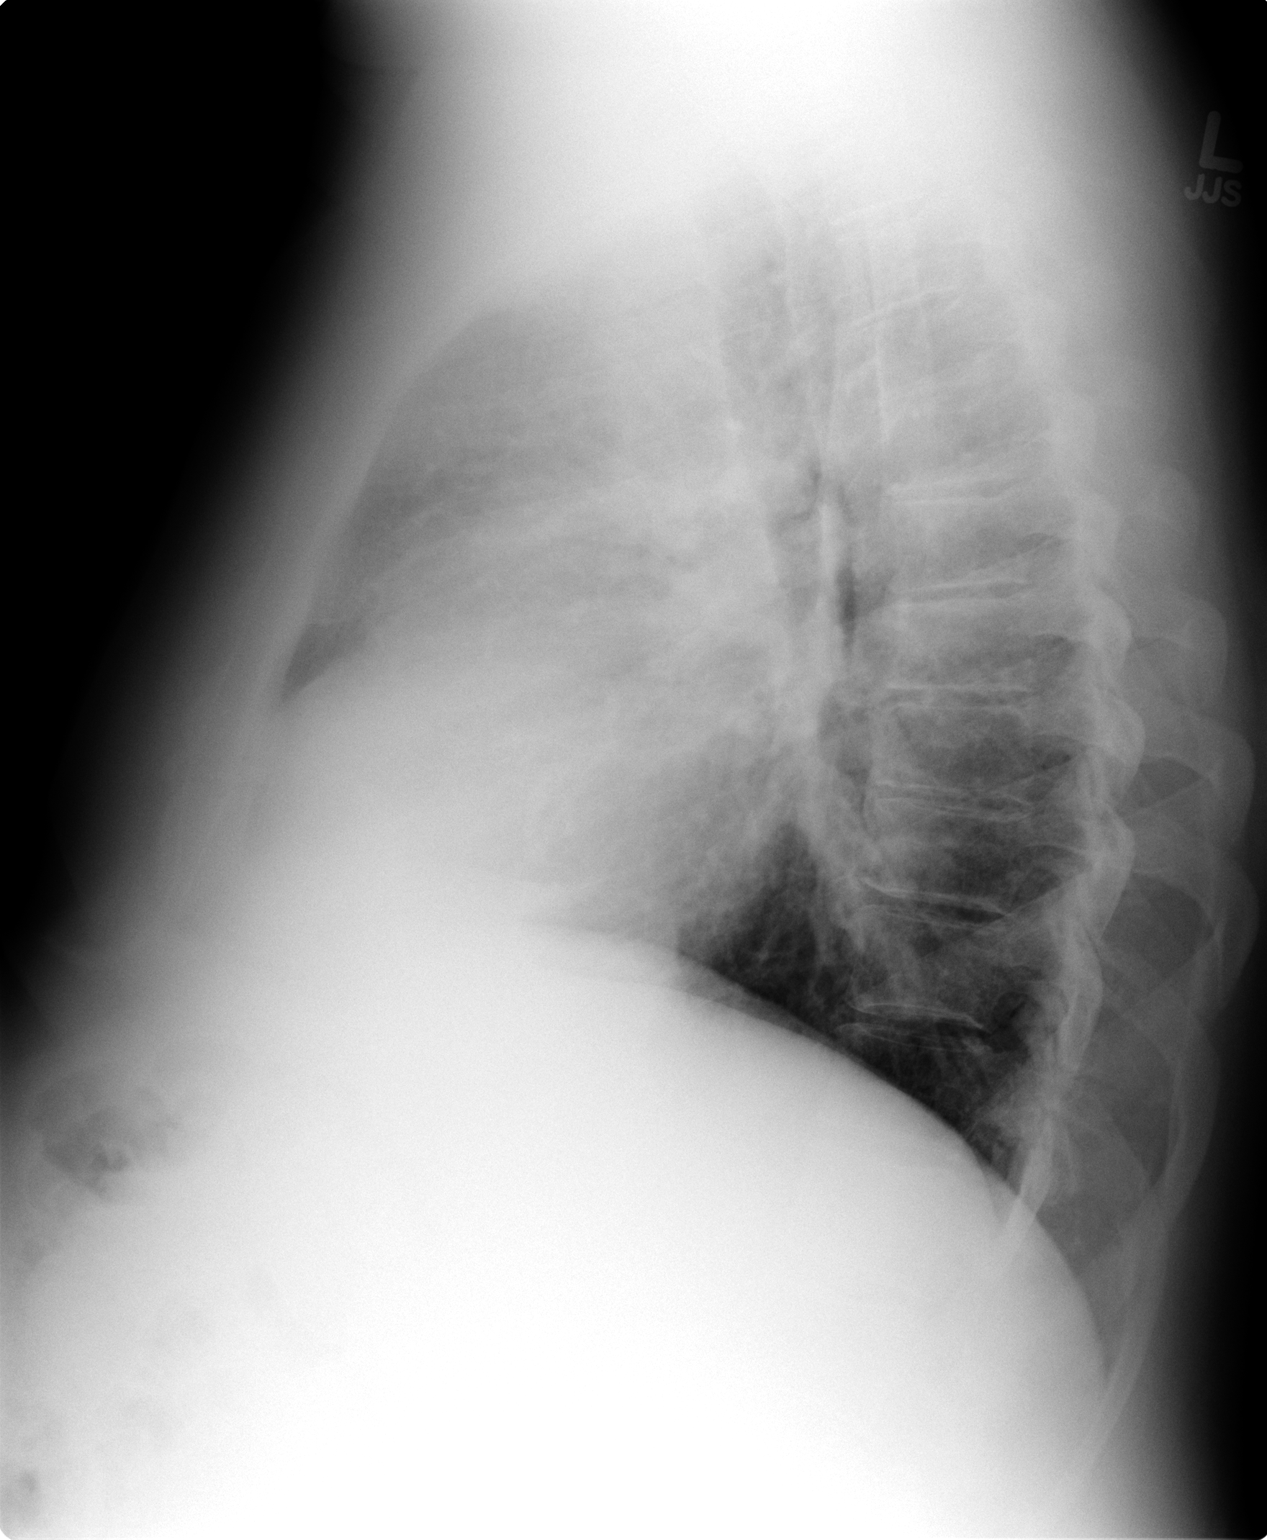

[2 of 2 positions shown; findings below may reference images not displayed]

FINDINGS: Heart size is normal.  No effusion or edema.

No airspace consolidation identified.

There is mild spondylosis within the thoracic spine.
IMPRESSION: 1.  No acute cardiopulmonary abnormalities.

## 2014-07-14 ENCOUNTER — Other Ambulatory Visit: Payer: Self-pay | Admitting: *Deleted

## 2014-07-14 MED ORDER — METFORMIN HCL 1000 MG PO TABS: ORAL_TABLET | ORAL | Status: DC

## 2014-07-14 MED ORDER — ALLOPURINOL 300 MG PO TABS: ORAL_TABLET | ORAL | Status: DC

## 2014-07-14 MED ORDER — GLIPIZIDE ER 5 MG PO TB24: ORAL_TABLET | ORAL | Status: DC

## 2014-07-14 MED ORDER — PRAVASTATIN SODIUM 80 MG PO TABS: ORAL_TABLET | ORAL | Status: DC

## 2014-07-14 MED ORDER — PANTOPRAZOLE SODIUM 40 MG PO TBEC: 40.0000 mg | DELAYED_RELEASE_TABLET | Freq: Every day | ORAL | Status: DC

## 2014-07-14 NOTE — Telephone Encounter (Deleted)
Please advise refill for pantoprazole

## 2014-08-08 DIAGNOSIS — M79671 Pain in right foot: Secondary | ICD-10-CM | POA: Diagnosis not present

## 2014-08-18 ENCOUNTER — Ambulatory Visit (INDEPENDENT_AMBULATORY_CARE_PROVIDER_SITE_OTHER): Payer: Medicare Other | Admitting: Family Medicine

## 2014-08-18 ENCOUNTER — Encounter: Payer: Self-pay | Admitting: Family Medicine

## 2014-08-18 VITALS — BP 139/84 | HR 68 | Temp 98.4°F | Resp 18 | Ht 68.0 in | Wt 283.0 lb

## 2014-08-18 DIAGNOSIS — E119 Type 2 diabetes mellitus without complications: Secondary | ICD-10-CM | POA: Diagnosis not present

## 2014-08-18 DIAGNOSIS — E785 Hyperlipidemia, unspecified: Secondary | ICD-10-CM | POA: Diagnosis not present

## 2014-08-18 DIAGNOSIS — I10 Essential (primary) hypertension: Secondary | ICD-10-CM

## 2014-08-18 LAB — MICROALBUMIN / CREATININE URINE RATIO
Creatinine,U: 213.5 mg/dL
Microalb Creat Ratio: 0.5 mg/g (ref 0.0–30.0)
Microalb, Ur: 1.1 mg/dL (ref 0.0–1.9)

## 2014-08-18 LAB — LIPID PANEL
Cholesterol: 139 mg/dL (ref 0–200)
HDL: 33.3 mg/dL — AB (ref >39.00)
LDL Cholesterol: 73 mg/dL (ref 0–99)
NonHDL: 105.7
Total CHOL/HDL Ratio: 4
Triglycerides: 166 mg/dL — ABNORMAL HIGH (ref 0.0–149.0)
VLDL: 33.2 mg/dL (ref 0.0–40.0)

## 2014-08-18 LAB — HEMOGLOBIN A1C: Hgb A1c MFr Bld: 6.9 % — ABNORMAL HIGH (ref 4.6–6.5)

## 2014-08-18 NOTE — Progress Notes (Signed)
Pre visit review using our clinic review tool, if applicable. No additional management support is needed unless otherwise documented below in the visit note. 

## 2014-08-18 NOTE — Progress Notes (Signed)
OFFICE VISIT  08/18/2014   CC:  Chief Complaint  Jack [name] presents with  . Follow-up    fasting     HPI:    Jack [name] is a 69 y.o. Caucasian male who presents for 4 mo f/u DM 2, HTN, hyperlipidemia.  Feet: no burning, tingling, or numbness in feet. Just some intermittent pain and swelling in right foot and ankle from chronic arthritis-has seen Dr. Latanya Maudlin in ortho multiple times and nothing different can be done.  Sugars about the same as usual he says, fair control. No home bp monitoring to report.  No HAs, no dizziness, no vision c/o, no SOB or CP.  No acute complaints.  Past Medical History  Diagnosis Date  . Diabetes mellitus     type 2- non insulin-requiring  . Hyperlipidemia   . Hypertension     severe LVH on echo  . OSA (obstructive sleep apnea)     Could not tolerate CPAP, not surgical candidate per Dr. Benjamine Mola  . Morbid obesity   . Gout   . GERD (gastroesophageal reflux disease)     EDG 04-03-01, + distal esophageal stricture dilation  . Fatty liver u/s and CT 2009    w/mildly elevated transaminases; u/s 12/2012 reconfirmed dx.  Hep B and C testing negative.  Marland Kitchen Dyspnea     Cardiology w/u unrevealing except LVOT obstruction: suspect dyspnea due to morbid obesity and OSA  . Subvalvular aortic stenosis 2012    LVOT obstruction stable on echo 01/09/12--without evidence of HOCM.  Marland Kitchen Carotid bruit 05/28/10    Tortuosity of carotids on doppler u/s, no stenosis.  . Stricture and stenosis of esophagus   . Hiatal hernia   . Moderate persistent asthma 04/19/2014    Past Surgical History  Procedure Laterality Date  . Colonoscopy  x 2    Dr. Sharlett Iles w/ [facility]  . Tee without cardioversion  05/2010    Dr. Einar Gip; severe LVH, no valvular abnormalities  . Cardiovascular stress test  2006 and 04/27/10    Normal stress nuclear study, normal EF  . Transthoracic echocardiogram  05/28/10    Normal LVEF, LVOT obstruction, lateral wall hypokinesis  . Transesophageal  echocardiogram  06/06/10    LVOT obst with 135 mm Hg PG.  Not HOCM.   No mitral regurg..  Repeat echo 01/09/12 no change.  . Nasal sinus surgery      Outpatient Prescriptions Prior to Visit  Medication Sig Dispense Refill  . albuterol (VENTOLIN HFA) 108 (90 BASE) MCG/ACT inhaler Inhale 2 puffs into the lungs every 4 (four) hours as needed for wheezing. 1 Inhaler 0  . allopurinol (ZYLOPRIM) 300 MG tablet Take 1 tablet (300 mg  total) by mouth daily. For  gout 90 tablet 1  . aspirin 325 MG tablet Take 325 mg by mouth daily.      . budesonide-formoterol (SYMBICORT) 160-4.5 MCG/ACT inhaler Inhale 2 puffs into the lungs 2 (two) times daily. 3 Inhaler 4  . furosemide (LASIX) 20 MG tablet Take 1 tablet by mouth Once daily as needed.    Marland Kitchen glipiZIDE (GLIPIZIDE XL) 5 MG 24 hr tablet Take 1 tablet by mouth  daily 90 tablet 1  . metFORMIN (GLUCOPHAGE) 1000 MG tablet Take 1 tablet (1,000 mg  total) by mouth 2 (two)  times daily with meal. 180 tablet 1  . metoprolol (LOPRESSOR) 50 MG tablet Take 1 tablet (50 mg total) by mouth 2 (two) times daily. As per Dr. Einar Gip 180 tablet 4  . pantoprazole ([place])  40 MG tablet Take 1 tablet (40 mg total) by mouth daily. 90 tablet 3  . potassium chloride SA (K-DUR,KLOR-CON) 20 MEQ tablet Take 1 tablet by mouth Once daily as needed.    . pravastatin (PRAVACHOL) 80 MG tablet Take 1 tablet by mouth  daily 90 tablet 1  . verapamil (CALAN-SR) 120 MG CR tablet Take 1 tablet (120 mg total) by mouth daily. 90 tablet 4  . glucose blood (ONE TOUCH ULTRA TEST) test strip Use as instructed to check blood sugar.  DX 250.00 100 each prn  . ONE TOUCH LANCETS MISC Use as directed to check blood sugar.  DX 250.00 200 each prn   No facility-administered medications prior to visit.    No Known Allergies  ROS As per HPI  PE: Blood pressure 139/84, pulse 68, temperature 98.4 F (36.9 C), temperature source Temporal, resp. rate 18, height 5\' 8"  (1.727 m), weight 283 lb (128.368 kg),  SpO2 96 %. Gen: Alert, well appearing.  Jack [name] is oriented to person, place, time, and situation. Foot exam - R ankle and foot with mild diffuse swelling, without edema, though.  No erythema or warmth either. No tenderness or skin or vascular lesions. Color and temperature is normal. Sensation is intact. Peripheral pulses are palpable. Toenails are normal.   LABS:  Lab Results  Component Value Date   TSH 3.08 04/19/2014   No results found for: WBC, HGB, HCT, MCV, PLT Lab Results  Component Value Date   CREATININE 1.0 04/19/2014   BUN 13 04/19/2014   NA 141 04/19/2014   K 4.6 04/19/2014   CL 107 04/19/2014   CO2 26 04/19/2014   Lab Results  Component Value Date   ALT 38 08/03/2013   AST 38* 08/03/2013   ALKPHOS 63 08/03/2013   BILITOT 0.7 08/03/2013   Lab Results  Component Value Date   CHOL 134 08/03/2013   Lab Results  Component Value Date   HDL 38.20* 08/03/2013   Lab Results  Component Value Date   LDLCALC 69 08/03/2013   Lab Results  Component Value Date   TRIG 136.0 08/03/2013   Lab Results  Component Value Date   CHOLHDL 4 08/03/2013   Lab Results  Component Value Date   PSA 0.71 04/19/2014   PSA 0.73 06/08/2012   PSA normal 12/28/2009   Lab Results  Component Value Date   HGBA1C 7.1* 04/19/2014    IMPRESSION AND PLAN:  1) DM 2, decent control. HbA1c today, urine microalb/cr today. Feet exam normal today.  2) HTN; The current medical regimen is effective;  continue present plan and medications.  3)  Hyperlipidemia; The current medical regimen is effective;  continue present plan and medications. FLP today.  An After Visit Summary was printed and given to the Jack [name].  FOLLOW UP: Return in about 4 months (around 12/18/2014) for routine chronic illness f/u.

## 2014-09-27 ENCOUNTER — Encounter: Payer: Self-pay | Admitting: Family Medicine

## 2014-09-27 ENCOUNTER — Telehealth: Payer: Self-pay | Admitting: Family Medicine

## 2014-09-27 DIAGNOSIS — M1990 Unspecified osteoarthritis, unspecified site: Secondary | ICD-10-CM

## 2014-09-27 NOTE — Telephone Encounter (Signed)
Patient is requesting referral to Dr. Trudie Reed. He saw orthopedists. The ortho doctor recommended patient see rheumatologist. Patient's wife already has an appt with Dr. Trudie Reed 10/26/14 which she is willing to give up if they can't see both of them on the same day. Patient's wife has a copy of the patient's xrays that were done at [facility]. She has offered to bring them by here if Dr. Anitra Lauth needs to see them.

## 2014-09-27 NOTE — Telephone Encounter (Signed)
Thank you :)

## 2014-09-27 NOTE — Telephone Encounter (Signed)
Referral ordered as per pt request. I don't need his x-rays but the rheumatologist will.-thx

## 2014-09-28 DIAGNOSIS — R600 Localized edema: Secondary | ICD-10-CM | POA: Diagnosis not present

## 2014-09-28 DIAGNOSIS — Q248 Other specified congenital malformations of heart: Secondary | ICD-10-CM | POA: Diagnosis not present

## 2014-09-28 DIAGNOSIS — I517 Cardiomegaly: Secondary | ICD-10-CM | POA: Diagnosis not present

## 2014-09-28 DIAGNOSIS — G4733 Obstructive sleep apnea (adult) (pediatric): Secondary | ICD-10-CM | POA: Diagnosis not present

## 2014-09-29 DIAGNOSIS — R600 Localized edema: Secondary | ICD-10-CM | POA: Diagnosis not present

## 2014-09-29 DIAGNOSIS — I10 Essential (primary) hypertension: Secondary | ICD-10-CM | POA: Diagnosis not present

## 2014-09-29 DIAGNOSIS — R0602 Shortness of breath: Secondary | ICD-10-CM | POA: Diagnosis not present

## 2014-10-02 ENCOUNTER — Encounter: Payer: Self-pay | Admitting: Family Medicine

## 2014-10-04 ENCOUNTER — Encounter: Payer: Self-pay | Admitting: Family Medicine

## 2014-10-13 DIAGNOSIS — I10 Essential (primary) hypertension: Secondary | ICD-10-CM | POA: Diagnosis not present

## 2014-10-25 DIAGNOSIS — I517 Cardiomegaly: Secondary | ICD-10-CM | POA: Diagnosis not present

## 2014-10-25 DIAGNOSIS — I10 Essential (primary) hypertension: Secondary | ICD-10-CM | POA: Diagnosis not present

## 2014-10-25 DIAGNOSIS — R600 Localized edema: Secondary | ICD-10-CM | POA: Diagnosis not present

## 2014-10-25 DIAGNOSIS — Q248 Other specified congenital malformations of heart: Secondary | ICD-10-CM | POA: Diagnosis not present

## 2014-11-25 ENCOUNTER — Telehealth: Payer: Self-pay | Admitting: *Deleted

## 2014-11-25 NOTE — Telephone Encounter (Signed)
Jack [name] with [facility] LMOM returning my call, she also left a fax number. Requested labs and vitals faxed to [phone].

## 2014-11-25 NOTE — Telephone Encounter (Signed)
Whitney with [facility] LMOM at 12:30pm on 11/25/14 stating that she is requesting blood results, BP, HR and date of pts last CPE. Left message for her to call back.

## 2014-12-01 ENCOUNTER — Encounter: Payer: Self-pay | Admitting: Gastroenterology

## 2015-01-05 ENCOUNTER — Encounter: Payer: Self-pay | Admitting: Family Medicine

## 2015-01-05 ENCOUNTER — Ambulatory Visit (INDEPENDENT_AMBULATORY_CARE_PROVIDER_SITE_OTHER): Payer: Medicare Other | Admitting: Family Medicine

## 2015-01-05 VITALS — BP 114/67 | HR 59 | Temp 97.4°F | Resp 18

## 2015-01-05 DIAGNOSIS — S39012A Strain of muscle, fascia and tendon of lower back, initial encounter: Secondary | ICD-10-CM | POA: Diagnosis not present

## 2015-01-05 MED ORDER — HYDROCODONE-ACETAMINOPHEN 5-325 MG PO TABS: 1.0000 | ORAL_TABLET | Freq: Four times a day (QID) | ORAL | Status: DC | PRN

## 2015-01-05 MED ORDER — PREDNISONE 50 MG PO TABS: 50.0000 mg | ORAL_TABLET | Freq: Every day | ORAL | Status: DC

## 2015-01-05 MED ORDER — CYCLOBENZAPRINE HCL 10 MG PO TABS: 10.0000 mg | ORAL_TABLET | Freq: Three times a day (TID) | ORAL | Status: DC | PRN

## 2015-01-05 NOTE — Progress Notes (Signed)
Subjective:    Patient ID: Jack [name], male    DOB: 05/31/1945, 69 y.o.   MRN: [id number]  HPI  Low back strain:  Patient presents for acute office visit after experiencing back pain 1 week ago. Patient states that he was loading a trailer, aunts on a heavy chain while rotating his upper body and he felt a pull in his right lower back.  He states he has been laying in bed resting, for the first few days he tried ice packs , that he switch to heating pads. He did have some flexor all prescription left over , and he has been taking those a few times a day as well. He states he's been taking half of Aleve 2-3 times a day. He hasn't felt great improvement, until this morning upon awakening he thought his back was better. When he went to roll over he felt a catch in his back and then he was unable to move. He feels the pain is usually worse in the morning , and although present in the evening is not as bad as in the morning. He is using a walker to help him get around the house, this is not his walker and he has never had to use a walker or assistive device before. He denies any bowel or bladder incontinence or  Difficulties. He denies any radiation of pain to either extremity or buttock. He states the pain is a 9/10 in his right lower back. He reports a prior history of back surgery in the early 1970s. He states he's not certain what the surgery was but he had a " bulge" in his spine from barrel horse racing.  There are no imaging studies of his lower back in the electronic medical record to review.  Past Medical History  Diagnosis Date  . Diabetes mellitus     type 2- non insulin-requiring  . Hyperlipidemia   . Hypertension     severe LVH on echo  . OSA (obstructive sleep apnea)     Could not tolerate CPAP, not surgical candidate per Dr. Benjamine Mola  . Morbid obesity   . Gout   . GERD (gastroesophageal reflux disease)     EDG 04-03-01, + distal esophageal stricture dilation  . Fatty liver u/s and CT  2009    w/mildly elevated transaminases; u/s 12/2012 reconfirmed dx.  Hep B and C testing negative.  Marland Kitchen Dyspnea     Cardiology w/u unrevealing except LVOT obstruction: suspect dyspnea due to morbid obesity and OSA  . Subvalvular aortic stenosis 2012    LVOT obstruction stable on echo 01/09/12 and again on f/u echo 09/29/14--without evidence of HOCM.  Marland Kitchen Carotid bruit 05/28/10    Tortuosity of carotids on doppler u/s, no stenosis.  . Stricture and stenosis of esophagus   . Hiatal hernia   . Moderate persistent asthma 04/19/2014  . Osteoarthritis, multiple sites    No Known Allergies. Past Surgical History  Procedure Laterality Date  . Colonoscopy  x 2    Dr. Sharlett Iles w/ [facility]  . Tee without cardioversion  05/2010    Dr. Einar Gip; severe LVH, no valvular abnormalities  . Cardiovascular stress test  2006 and 04/27/10    Normal stress nuclear study, normal EF  . Transthoracic echocardiogram  05/28/10    Normal LVEF, LVOT obstruction, lateral wall hypokinesis  . Transesophageal echocardiogram  06/06/10    LVOT obst with 135 mm Hg PG.  Not HOCM.   No mitral regurg.Marland Kitchen  Repeat echo 01/09/12 no change.  . Nasal sinus surgery    . Spine surgery  1970    ? Discectomy   Social History   Social History  . Marital Status: Married    Spouse Name: N/A  . Number of Children: 1  . Years of Education: N/A   Occupational History  . retired    Social History Main Topics  . Smoking status: Former Smoker -- 0.50 packs/day for 40 years    Types: Cigarettes, Cigars    Quit date: 04/29/1998  . Smokeless tobacco: Never Used     Comment: 4 cigars  . Alcohol Use: No     Comment: occasionally  . Drug Use: No  . Sexual Activity: Not on file   Other Topics Concern  . Not on file   Social History Narrative   Married, 1 son, 1 granddaughter.   Retired from [employer] 2000.   Hobbies: trading farm equipment.  Used to be a Environmental manager.   Tobacco x many years, quit @ 2005    No ETOH or drugs.   No regular exercise.Smoking Status:  quit             Review of Systems Negative, with the exception of above mentioned in HPI     Objective:   Physical Exam BP 114/67 mmHg  Pulse 59  Temp(Src) 97.4 F (36.3 C) (Oral)  Resp 18  SpO2 96% Gen: Afebrile.  Pleasant, Caucasian male, obese , using walker , appears in pain. Neuro/MSK:  Gait assisted with walker, slow cautious strides. Leaning over walker. Discomfort standing, from seated position. PERLA. EOMi. Alert. Oriented x3. Cranial nerves II through XII intact.   toe lift 5/5 Bilateral lower extremity.  Unable to perform straight leg raises, patient was in too much discomfort to get up on exam table. DTRs equal bilaterally.  Neurovascularly intact distally.    Assessment & Plan:  1. Low back strain, initial encounter -  Discussed in detail red flags, and caution signs to monitor her the course of his healing process. -  Encouraged patient to use a back brace when lifting heavy objects. -  Flexeril 10 mg prescribed twice a day when necessary, heating pad encouraged to times a day.  -  Considering patient's history of 1 week with back pain and no improvement and prior history of what sounds to be a discectomy, Prednisone burst for 5 days,  Patient is a  Well-controlled diabetic is to monitor his sugars over this time.  -  Norco 5-3 25 3  times a day as needed only, caution with constipation. -  Lower lumbar stretches to start in 4-7 days, pain being his guide on toleration of stretches.  -  Follow-up in one week if no improvement. Sooner if any alarm symptoms occur,  At that time we would likely need to obtain imaging studies.

## 2015-01-05 NOTE — Patient Instructions (Signed)

## 2015-01-16 ENCOUNTER — Telehealth: Payer: Self-pay | Admitting: Family Medicine

## 2015-01-16 NOTE — Telephone Encounter (Signed)
Left message on cell vm for pt to call back. Needs to schedule ov within next 3 months for f/u with Dr. Anitra Lauth. No more refills til pt has been seen in office.

## 2015-01-16 NOTE — Telephone Encounter (Signed)
LOV: 08/18/14 due for f/u in August NOV: None  RF request for metformin Last written: 07/13/13 #180 w/ 1RF  RF request for allopurinol Last written: 08/12/13   RF request for glipizide Last written: 07/14/14 #90 w/ 1RF  RF request for pravastatin Last written: 07/14/14 #90 w/ 1RF

## 2015-01-18 NOTE — Telephone Encounter (Signed)
Pts wife advised and voiced understanding, okay per DPR. 

## 2015-01-30 ENCOUNTER — Other Ambulatory Visit: Payer: Self-pay | Admitting: Family Medicine

## 2015-02-07 DIAGNOSIS — M9903 Segmental and somatic dysfunction of lumbar region: Secondary | ICD-10-CM | POA: Diagnosis not present

## 2015-02-07 DIAGNOSIS — M5136 Other intervertebral disc degeneration, lumbar region: Secondary | ICD-10-CM | POA: Diagnosis not present

## 2015-02-08 DIAGNOSIS — M9903 Segmental and somatic dysfunction of lumbar region: Secondary | ICD-10-CM | POA: Diagnosis not present

## 2015-02-08 DIAGNOSIS — M5136 Other intervertebral disc degeneration, lumbar region: Secondary | ICD-10-CM | POA: Diagnosis not present

## 2015-02-09 DIAGNOSIS — M9903 Segmental and somatic dysfunction of lumbar region: Secondary | ICD-10-CM | POA: Diagnosis not present

## 2015-02-09 DIAGNOSIS — M5136 Other intervertebral disc degeneration, lumbar region: Secondary | ICD-10-CM | POA: Diagnosis not present

## 2015-02-13 DIAGNOSIS — M5136 Other intervertebral disc degeneration, lumbar region: Secondary | ICD-10-CM | POA: Diagnosis not present

## 2015-02-13 DIAGNOSIS — M9903 Segmental and somatic dysfunction of lumbar region: Secondary | ICD-10-CM | POA: Diagnosis not present

## 2015-02-15 DIAGNOSIS — M5136 Other intervertebral disc degeneration, lumbar region: Secondary | ICD-10-CM | POA: Diagnosis not present

## 2015-02-15 DIAGNOSIS — M9903 Segmental and somatic dysfunction of lumbar region: Secondary | ICD-10-CM | POA: Diagnosis not present

## 2015-02-20 DIAGNOSIS — M5136 Other intervertebral disc degeneration, lumbar region: Secondary | ICD-10-CM | POA: Diagnosis not present

## 2015-02-20 DIAGNOSIS — M9903 Segmental and somatic dysfunction of lumbar region: Secondary | ICD-10-CM | POA: Diagnosis not present

## 2015-02-23 DIAGNOSIS — M5136 Other intervertebral disc degeneration, lumbar region: Secondary | ICD-10-CM | POA: Diagnosis not present

## 2015-02-23 DIAGNOSIS — M9903 Segmental and somatic dysfunction of lumbar region: Secondary | ICD-10-CM | POA: Diagnosis not present

## 2015-02-27 DIAGNOSIS — M9903 Segmental and somatic dysfunction of lumbar region: Secondary | ICD-10-CM | POA: Diagnosis not present

## 2015-02-27 DIAGNOSIS — M5136 Other intervertebral disc degeneration, lumbar region: Secondary | ICD-10-CM | POA: Diagnosis not present

## 2015-03-02 DIAGNOSIS — M5136 Other intervertebral disc degeneration, lumbar region: Secondary | ICD-10-CM | POA: Diagnosis not present

## 2015-03-02 DIAGNOSIS — M9903 Segmental and somatic dysfunction of lumbar region: Secondary | ICD-10-CM | POA: Diagnosis not present

## 2015-03-06 ENCOUNTER — Other Ambulatory Visit: Payer: Self-pay | Admitting: Family Medicine

## 2015-03-06 DIAGNOSIS — M9903 Segmental and somatic dysfunction of lumbar region: Secondary | ICD-10-CM | POA: Diagnosis not present

## 2015-03-06 DIAGNOSIS — M5136 Other intervertebral disc degeneration, lumbar region: Secondary | ICD-10-CM | POA: Diagnosis not present

## 2015-03-08 ENCOUNTER — Ambulatory Visit (INDEPENDENT_AMBULATORY_CARE_PROVIDER_SITE_OTHER): Payer: Medicare Other | Admitting: Family Medicine

## 2015-03-08 ENCOUNTER — Telehealth: Payer: Self-pay | Admitting: Family Medicine

## 2015-03-08 ENCOUNTER — Encounter: Payer: Self-pay | Admitting: Family Medicine

## 2015-03-08 VITALS — BP 131/85 | HR 76 | Temp 98.1°F | Resp 16 | Ht 68.0 in | Wt 282.0 lb

## 2015-03-08 DIAGNOSIS — J018 Other acute sinusitis: Secondary | ICD-10-CM | POA: Diagnosis not present

## 2015-03-08 MED ORDER — AMOXICILLIN 875 MG PO TABS: 875.0000 mg | ORAL_TABLET | Freq: Two times a day (BID) | ORAL | Status: AC

## 2015-03-08 NOTE — Telephone Encounter (Signed)
Pt is having allergy problems. He has used everything over the counter and it does not work. He is requesting a RX allergy medication. Please call him at [phone].

## 2015-03-08 NOTE — Patient Instructions (Signed)
Use 2-3 sprays of saline nasal spray in each nostril 1-2 times a day to irrigate nasal passages.

## 2015-03-08 NOTE — Telephone Encounter (Signed)
Spoke to pt apt made for 9:30am today to discuss this.

## 2015-03-08 NOTE — Progress Notes (Signed)
Pre visit review using our clinic review tool, if applicable. No additional management support is needed unless otherwise documented below in the visit note. 

## 2015-03-08 NOTE — Progress Notes (Signed)
OFFICE NOTE  03/08/2015  CC:  Chief Complaint  Patient presents with  . Allergic Rhinitis   . Excessive Sweating   HPI: Patient is a 69 y.o. Caucasian male who is here for 2 weeks hx of throat drainage, hacking/coughing a lot, nasal congestion/runny nose/sneezing.  No fever.  No SOB, no wheezing.   No upper teeth pain, no facial pain.    Pertinent PMH:  Past medical, surgical, social, and family history reviewed and no changes are noted since last office visit.  MEDS:  Outpatient Prescriptions Prior to Visit  Medication Sig Dispense Refill  . albuterol (VENTOLIN HFA) 108 (90 BASE) MCG/ACT inhaler Inhale 2 puffs into the lungs every 4 (four) hours as needed for wheezing. 1 Inhaler 0  . allopurinol (ZYLOPRIM) 300 MG tablet Take 1 tablet by mouth  every day for gout 90 tablet 0  . aspirin 325 MG tablet Take 325 mg by mouth daily.      Marland Kitchen eplerenone (INSPRA) 50 MG tablet Take 50 mg by mouth daily. Started 09/28/14 by Dr. Einar Gip    . GLIPIZIDE XL 5 MG 24 hr tablet Take 1 tablet by mouth  daily 90 tablet 0  . glucose blood (ONE TOUCH ULTRA TEST) test strip Use as instructed to check blood sugar.  DX 250.00 100 each prn  . metFORMIN (GLUCOPHAGE) 1000 MG tablet Take 1 tablet by mouth two  times daily with meals 180 tablet 0  . metoprolol (LOPRESSOR) 50 MG tablet Take 1 tablet (50 mg total) by mouth 2 (two) times daily. As per Dr. Einar Gip 180 tablet 4  . ONE TOUCH LANCETS MISC Use as directed to check blood sugar.  DX 250.00 200 each prn  . pantoprazole (PROTONIX) 40 MG tablet Take 1 tablet (40 mg total) by mouth daily. 90 tablet 3  . pravastatin (PRAVACHOL) 80 MG tablet Take 1 tablet by mouth  daily 90 tablet 0  . verapamil (CALAN-SR) 120 MG CR tablet Take 1 tablet (120 mg total) by mouth daily. 90 tablet 4  . budesonide-formoterol (SYMBICORT) 160-4.5 MCG/ACT inhaler Inhale 2 puffs into the lungs 2 (two) times daily. 3 Inhaler 4  . cyclobenzaprine (FLEXERIL) 10 MG tablet Take 1 tablet (10 mg total)  by mouth 3 (three) times daily as needed for muscle spasms. (Patient not taking: Reported on 03/08/2015) 30 tablet 0  . furosemide (LASIX) 20 MG tablet Take 1 tablet by mouth Once daily as needed.    Marland Kitchen HYDROcodone-acetaminophen (NORCO) 5-325 MG per tablet Take 1 tablet by mouth every 6 (six) hours as needed for moderate pain. (Patient not taking: Reported on 03/08/2015) 30 tablet 0  . potassium chloride SA (K-DUR,KLOR-CON) 20 MEQ tablet Take 1 tablet by mouth Once daily as needed.    . predniSONE (DELTASONE) 50 MG tablet Take 1 tablet (50 mg total) by mouth daily with breakfast. (Patient not taking: Reported on 03/08/2015) 5 tablet 0   No facility-administered medications prior to visit.    PE: Blood pressure 131/85, pulse 76, temperature 98.1 F (36.7 C), temperature source Oral, resp. rate 16, height 5\' 8"  (1.727 m), weight 282 lb (127.914 kg), SpO2 97 %. VS: noted--normal. Gen: alert, NAD, NONTOXIC APPEARING. HEENT: eyes without injection, drainage, or swelling.  Ears: EACs clear, TMs with normal light reflex and landmarks.  Nose: Clear rhinorrhea, with some dried, crusty exudate adherent to mildly injected mucosa.  No purulent d/c.  No paranasal sinus TTP.  No facial swelling.  Throat and mouth without focal lesion.  No  pharyngial swelling, erythema, or exudate.   Neck: supple, no LAD.   LUNGS: CTA bilat, nonlabored resps.   CV: RRR, no m/r/g. EXT: no c/c/e SKIN: no rash   Lab Results  Component Value Date   TSH 3.08 04/19/2014    IMPRESSION AND PLAN:  Prolonged URI, less likely allergic rhinitis. Will do trial of amoxil 875mg  bid x 10d, encouraged him to restart nasal saline spray, and recommended OTC allegra once daily prn.  An After Visit Summary was printed and given to the patient.  FOLLOW UP: prn

## 2015-03-09 DIAGNOSIS — M5136 Other intervertebral disc degeneration, lumbar region: Secondary | ICD-10-CM | POA: Diagnosis not present

## 2015-03-09 DIAGNOSIS — M9903 Segmental and somatic dysfunction of lumbar region: Secondary | ICD-10-CM | POA: Diagnosis not present

## 2015-03-30 DIAGNOSIS — M5136 Other intervertebral disc degeneration, lumbar region: Secondary | ICD-10-CM | POA: Diagnosis not present

## 2015-03-30 DIAGNOSIS — M9903 Segmental and somatic dysfunction of lumbar region: Secondary | ICD-10-CM | POA: Diagnosis not present

## 2015-04-27 DIAGNOSIS — M5136 Other intervertebral disc degeneration, lumbar region: Secondary | ICD-10-CM | POA: Diagnosis not present

## 2015-04-27 DIAGNOSIS — M9903 Segmental and somatic dysfunction of lumbar region: Secondary | ICD-10-CM | POA: Diagnosis not present

## 2015-04-28 DIAGNOSIS — I421 Obstructive hypertrophic cardiomyopathy: Secondary | ICD-10-CM | POA: Diagnosis not present

## 2015-05-02 ENCOUNTER — Ambulatory Visit (INDEPENDENT_AMBULATORY_CARE_PROVIDER_SITE_OTHER): Payer: Medicare Other | Admitting: Family Medicine

## 2015-05-02 ENCOUNTER — Encounter: Payer: Self-pay | Admitting: Family Medicine

## 2015-05-02 VITALS — BP 141/86 | HR 86 | Temp 98.2°F | Resp 16 | Ht 68.0 in | Wt 279.5 lb

## 2015-05-02 DIAGNOSIS — J209 Acute bronchitis, unspecified: Secondary | ICD-10-CM | POA: Diagnosis not present

## 2015-05-02 DIAGNOSIS — J4530 Mild persistent asthma, uncomplicated: Secondary | ICD-10-CM

## 2015-05-02 MED ORDER — HYDROCODONE-HOMATROPINE 5-1.5 MG/5ML PO SYRP: ORAL_SOLUTION | ORAL | Status: DC

## 2015-05-02 MED ORDER — METHYLPREDNISOLONE ACETATE 80 MG/ML IJ SUSP
80.0000 mg | Freq: Once | INTRAMUSCULAR | Status: AC
  Administered 2015-05-02: 80 mg via INTRAMUSCULAR

## 2015-05-02 NOTE — Progress Notes (Signed)
OFFICE NOTE  05/02/2015  CC:  Chief Complaint  Patient presents with  . Cough    x 2 weeks   HPI: Patient is a 70 y.o. Caucasian male who is here for cough. Has felt cough/congestion from throat region to lower sternal region productive of yellow sputum. Minimal nasal symptoms.  Mucinex helps a little.  Feels like he can't get good breaths, ?has some wheezing. NO fevers--says he "feels hot all the time" as his normal.  Not using symbicort: says he only uses this on a prn basis.  Not using albuterol for sx's the last 2 wks.  "I just can't kick it, doc". No body aches, no appetite changes, no n/v/d.  Of note, pt had prolonged URI/sinusitis illness about 2 mo ago for which I gave a 10d course of amoxil and he says he feels like this "knocked it out" well.   Pertinent PMH:  Past medical, surgical, social, and family history reviewed and no changes are noted since last office visit.  MEDS:  Outpatient Prescriptions Prior to Visit  Medication Sig Dispense Refill  . albuterol (VENTOLIN HFA) 108 (90 BASE) MCG/ACT inhaler Inhale 2 puffs into the lungs every 4 (four) hours as needed for wheezing. 1 Inhaler 0  . allopurinol (ZYLOPRIM) 300 MG tablet Take 1 tablet by mouth  every day for gout 90 tablet 0  . aspirin 325 MG tablet Take 325 mg by mouth daily.      . budesonide-formoterol (SYMBICORT) 160-4.5 MCG/ACT inhaler Inhale 2 puffs into the lungs 2 (two) times daily. 3 Inhaler 4  . Cholecalciferol (VITAMIN D) 2000 UNITS tablet Take 2,000 Units by mouth daily.    Marland Kitchen eplerenone (INSPRA) 50 MG tablet Take 50 mg by mouth daily. Started 09/28/14 by Dr. Einar Gip    . GLIPIZIDE XL 5 MG 24 hr tablet Take 1 tablet by mouth  daily 90 tablet 0  . glucose blood (ONE TOUCH ULTRA TEST) test strip Use as instructed to check blood sugar.  DX 250.00 100 each prn  . metFORMIN (GLUCOPHAGE) 1000 MG tablet Take 1 tablet by mouth two  times daily with meals 180 tablet 0  . metoprolol (LOPRESSOR) 50 MG tablet Take 1  tablet (50 mg total) by mouth 2 (two) times daily. As per Dr. Einar Gip 180 tablet 4  . ONE TOUCH LANCETS MISC Use as directed to check blood sugar.  DX 250.00 200 each prn  . pantoprazole (PROTONIX) 40 MG tablet Take 1 tablet (40 mg total) by mouth daily. 90 tablet 3  . pravastatin (PRAVACHOL) 80 MG tablet Take 1 tablet by mouth  daily 90 tablet 0  . verapamil (CALAN-SR) 120 MG CR tablet Take 1 tablet (120 mg total) by mouth daily. 90 tablet 4   No facility-administered medications prior to visit.    PE: Blood pressure 141/86, pulse 86, temperature 98.2 F (36.8 C), temperature source Oral, resp. rate 16, height 5\' 8"  (1.727 m), weight 279 lb 8 oz (126.78 kg), SpO2 95 %. VS: noted--normal. Gen: alert, NAD, NONTOXIC APPEARING. HEENT: eyes without injection, drainage, or swelling.  Ears: EACs clear, TMs with normal light reflex and landmarks.  Nose: Clear rhinorrhea, with some dried, crusty exudate adherent to mildly injected mucosa.  No purulent d/c.  No paranasal sinus TTP.  No facial swelling.  Throat and mouth without focal lesion.  No pharyngial swelling, erythema, or exudate.   Neck: supple, no LAD.   LUNGS: CTA bilat, nonlabored resps.   Occ post-exhalation coughing. CV: RRR, 3-4/6  holosystolic murmur.  No diastolic murmur.  No r/g.   IMPRESSION AND PLAN:  Acute bronchitis, suspect viral etiology. Suspect mild component of RAD that may be contributing to his cough. Pt noncompliant with symbicort for his persistent asthma.  I told him to simply stop taking this altogether rather than taking it prn.  It sounds like he hasn't really been having wheezing w/any regularity at all. Reviewed basics of inhaler use today, including the difference between a maintenance inhaler and a prn inhaler.  I feel like any current breathlessness the pt has is not new and he concurs.  This is due to his body habitus and general deconditioned state.  Plan: depo-medrol 80mg  IM today.  Continue mucinex DM in  daytime for cough, use ventolin HFA q4h prn and see if this helps any w/cough. Hycodan syrup 1-2 tsp qhs prn, #143ml.  Therapeutic expectations and side effect profile of medication discussed today.  Patient's questions answered. Signs/symptoms to call or return for were reviewed and pt expressed understanding.   An After Visit Summary was printed and given to the patient.   FOLLOW UP: prn

## 2015-05-02 NOTE — Addendum Note (Signed)
Addended by: Lanae Crumbly on: 05/02/2015 09:50 AM   Modules accepted: Orders

## 2015-05-02 NOTE — Progress Notes (Signed)
Pre visit review using our clinic review tool, if applicable. No additional management support is needed unless otherwise documented below in the visit note. 

## 2015-05-03 ENCOUNTER — Encounter: Payer: Self-pay | Admitting: Family Medicine

## 2015-05-22 DIAGNOSIS — H2513 Age-related nuclear cataract, bilateral: Secondary | ICD-10-CM | POA: Diagnosis not present

## 2015-05-22 DIAGNOSIS — E119 Type 2 diabetes mellitus without complications: Secondary | ICD-10-CM | POA: Diagnosis not present

## 2015-05-22 DIAGNOSIS — H53022 Refractive amblyopia, left eye: Secondary | ICD-10-CM | POA: Diagnosis not present

## 2015-05-22 LAB — HM DIABETES EYE EXAM

## 2015-06-16 ENCOUNTER — Telehealth: Payer: Self-pay | Admitting: *Deleted

## 2015-06-16 NOTE — Telephone Encounter (Signed)
Pts wife LMOM on 06/16/15 at 8:40am stating that pt was seen on 05/02/15 for acute bronchitis and was treated. She stated that he was told that if the cough returned to call back so something could be sent in. She stated that he has had a cough x 3 days. Please advise. Thanks.

## 2015-06-26 ENCOUNTER — Ambulatory Visit (INDEPENDENT_AMBULATORY_CARE_PROVIDER_SITE_OTHER): Payer: Medicare Other | Admitting: Family Medicine

## 2015-06-26 ENCOUNTER — Encounter: Payer: Self-pay | Admitting: Family Medicine

## 2015-06-26 DIAGNOSIS — J209 Acute bronchitis, unspecified: Secondary | ICD-10-CM | POA: Diagnosis not present

## 2015-06-26 MED ORDER — DOXYCYCLINE HYCLATE 100 MG PO TABS: 100.0000 mg | ORAL_TABLET | Freq: Two times a day (BID) | ORAL | Status: DC

## 2015-06-26 MED ORDER — HYDROCODONE-HOMATROPINE 5-1.5 MG/5ML PO SYRP: ORAL_SOLUTION | ORAL | Status: DC

## 2015-06-26 MED ORDER — FLUTICASONE PROPIONATE 50 MCG/ACT NA SUSP: 2.0000 | Freq: Every day | NASAL | Status: DC

## 2015-06-26 MED ORDER — ALBUTEROL SULFATE HFA 108 (90 BASE) MCG/ACT IN AERS: 2.0000 | INHALATION_SPRAY | RESPIRATORY_TRACT | Status: DC | PRN

## 2015-06-26 NOTE — Patient Instructions (Signed)
Doxycyline and flonase prescribed Use cough syrup and plain mucinex for cough.  Rest and stay hydrated.  Rescue inhaler use if wheezing or chest tightness.

## 2015-06-26 NOTE — Progress Notes (Signed)
Patient ID: Jack [name], male   DOB: 06-30-1945, 70 y.o.   MRN: [id number]    Jack [name] , 1946/03/18, 70 y.o., male MRN: [id number]  CC: cough Subjective: Pt presents for an acute OV with complaints of continued bronchitis symptoms of  3 weeks duration. Patient was seen 8 weeks ago with similar symptoms, given depo medrol IM and hycodan cough syrup, and had a resolution of symptoms. Current episode has been going on for 3 weeks. Associated symptoms include  nasal congestion and rhinorrhea. Patient denies no fever, chills, facial pressure, headaches, diarrhea.  Pt feels symptoms are worse with talking. Pt has tried mucinex, dry cough to ease their symptoms. Still has small amount of  hycodan syrup.  Pt has a h/o LPR OSA and  asthma Uses albuterol/symbicort.   No Known Allergies Social History  Substance Use Topics  . Smoking status: Former Smoker -- 0.50 packs/day for 40 years    Types: Cigarettes, Cigars    Quit date: 04/29/1998  . Smokeless tobacco: Never Used     Comment: 4 cigars  . Alcohol Use: No     Comment: occasionally   Past Medical History  Diagnosis Date  . Diabetes mellitus     type 2- non insulin-requiring  . Hyperlipidemia   . Hypertension     severe LVH on echo  . OSA (obstructive sleep apnea)     Could not tolerate CPAP, not surgical candidate per Dr. Benjamine Mola  . Morbid obesity ([place])   . Gout   . GERD (gastroesophageal reflux disease)     EDG 04-03-01, + distal esophageal stricture dilation  . Fatty liver u/s and CT 2009    w/mildly elevated transaminases; u/s 12/2012 reconfirmed dx.  Hep B and C testing negative.  Marland Kitchen Dyspnea     Cardiology w/u unrevealing except LVOT obstruction: suspect dyspnea due to morbid obesity and OSA  . Subvalvular aortic stenosis 2012    LVOT obstruction stable on echo 01/09/12 and again on f/u echo 09/29/14--with evidence of HOCM.  Marland Kitchen Carotid bruit 05/28/10    Tortuosity of carotids on doppler u/s, no stenosis.  . Stricture and  stenosis of esophagus   . Hiatal hernia   . Moderate persistent asthma 04/19/2014  . Osteoarthritis, multiple sites    Past Surgical History  Procedure Laterality Date  . Colonoscopy  x 2    Dr. Sharlett Iles w/ [facility]  . Tee without cardioversion  05/2010    Dr. Einar Gip; severe LVH, no valvular abnormalities  . Cardiovascular stress test  2006 and 04/27/10    Normal stress nuclear study, normal EF  . Transthoracic echocardiogram  05/28/10    Normal LVEF, LVOT obstruction, lateral wall hypokinesis  . Transesophageal echocardiogram  06/06/10    LVOT obst with 135 mm Hg PG.  Not HOCM.   No mitral regurg..  Repeat echo 01/09/12 no change.  . Nasal sinus surgery    . Spine surgery  1970    ? Discectomy   Family History  Problem Relation Age of Onset  . Prostate cancer Father     Prostate  . Hypertension Neg Hx   . Coronary artery disease Neg Hx   . Colon cancer Neg Hx   . Stomach cancer Neg Hx   . Rectal cancer Neg Hx   . Breast cancer Sister   . Diabetes Mother   . Diabetes Maternal Aunt   . Breast cancer Maternal Aunt      Medication List  This list is accurate as of: 06/26/15  3:49 PM.  Always use your most recent med list.               albuterol 108 (90 Base) MCG/ACT inhaler  Commonly known as:  VENTOLIN HFA  Inhale 2 puffs into the lungs every 4 (four) hours as needed for wheezing.     allopurinol 300 MG tablet  Commonly known as:  ZYLOPRIM  Take 1 tablet by mouth  every day for gout     aspirin 325 MG tablet  Take 325 mg by mouth daily.     doxycycline 100 MG tablet  Commonly known as:  VIBRA-TABS  Take 1 tablet (100 mg total) by mouth 2 (two) times daily.     eplerenone 50 MG tablet  Commonly known as:  INSPRA  Take 50 mg by mouth daily. Started 09/28/14 by Dr. Einar Gip     fluticasone 50 MCG/ACT nasal spray  Commonly known as:  FLONASE  Place 2 sprays into both nostrils daily.     GLIPIZIDE XL 5 MG 24 hr tablet  Generic drug:  glipiZIDE  Take 1  tablet by mouth  daily     glucose blood test strip  Commonly known as:  ONE TOUCH ULTRA TEST  Use as instructed to check blood sugar.  DX 250.00     HYDROcodone-homatropine 5-1.5 MG/5ML syrup  Commonly known as:  HYCODAN  1-2 tsp po qhs prn cough     metFORMIN 1000 MG tablet  Commonly known as:  GLUCOPHAGE  Take 1 tablet by mouth two  times daily with meals     metoprolol 50 MG tablet  Commonly known as:  LOPRESSOR  Take 1 tablet (50 mg total) by mouth 2 (two) times daily. As per Dr. Einar Gip     ONE [facility] LANCETS Misc  Use as directed to check blood sugar.  DX 250.00     pantoprazole 40 MG tablet  Commonly known as:  PROTONIX  Take 1 tablet (40 mg total) by mouth daily.     pravastatin 80 MG tablet  Commonly known as:  PRAVACHOL  Take 1 tablet by mouth  daily     verapamil 120 MG CR tablet  Commonly known as:  CALAN-SR  Take 1 tablet (120 mg total) by mouth daily.     Vitamin D 2000 units tablet  Take 2,000 Units by mouth daily.         ROS: Negative, with the exception of above mentioned in HPI   Objective:  BP 122/78 mmHg  Pulse 80  Temp(Src) 97.6 F (36.4 C) (Oral)  Resp 20  Ht 5\' 8"  (1.727 m)  Wt 274 lb 12.8 oz (124.648 kg)  BMI 41.79 kg/m2  SpO2 96% Body mass index is 41.79 kg/(m^2). Gen: Afebrile. No acute distress. Nontoxic in appearance. Well devoped, well nourished, pleasant. HENT: AT. [place]. Bilateral TM visualized and normal in appearance. MMM, no oral lesions. Bilateral nares with erythema and swelling. Throat without erythema or exudates. Dry cough on exam, no hoarseness. No TTP maxillary sinus.  Eyes:Pupils Equal Round Reactive to light, Extraocular movements intact,  Conjunctiva without redness, discharge or icterus. Neck/lymp/endocrine: Supple, No lymphadenopathy CV: RRR, 2/6 SM Chest: CTAB, no wheeze or crackles. Good air movement, normal resp effort.  Abd: Soft.  NTND. BS present Skin: No rashes, purpura or petechiae.  Neuro: Normal gait.  PERLA. EOMi. Alert. Oriented x3   Assessment/Plan: Jack [name] is a 70 y.o. male present for acute  OV for  1. Acute bronchitis, unspecified organism - doxycycline (VIBRA-TABS) 100 MG tablet; Take 1 tablet (100 mg total) by mouth 2 (two) times daily.  Dispense: 20 tablet; Refill: 0 - Lung exam normal today, discouraged from repeating steroid today since he just received IM 80 depo medrol 8 weeks ago and normal exam today - fluticasone (FLONASE) 50 MCG/ACT nasal spray; Place 2 sprays into both nostrils daily.  Dispense: 16 g; Refill: 6 - refill on albuterol inhaler, pt instructed on use with wheezing or chest tightness.  - Mucinex, rest, hydrate, albuterol, hycodan refill.  - F/U if worsening or no improvement in 1 week.   electronically signed by:  Howard Pouch, DO  [place]

## 2015-06-30 ENCOUNTER — Other Ambulatory Visit: Payer: Self-pay | Admitting: Family Medicine

## 2015-06-30 NOTE — Telephone Encounter (Signed)
LOV: 08/18/14, this is the last f/u ov, pt has been seen for acute visit but has not followed up on [facility] since 08/18/14. His wife was advised that he needed an apt on 01/16/15. NOV: None  RF request for pravastatin Last written: 01/16/15 #90 w/ [id number]  RF request for pantoprazole Last written: 07/14/14 #90 w/ 3RF  RF request for metformin Last written: 01/16/15 #180 w/0RF  RF request for glipizide Last written: 01/16/15 #90 w/ 0RF  RF request for verapamil Last written: 04/19/14 #90 w/ 4RF  The above medications have been sent to pharmacy for #90 w/ 0RF.  RF request for allopurinol Last written: 01/16/15 #90 w/ 0RF - please advise. Thanks.

## 2015-06-30 NOTE — Telephone Encounter (Signed)
Left message for pt to call back  °

## 2015-06-30 NOTE — Telephone Encounter (Addendum)
Pts wife advised and voiced understanding, okay per DPR. She stated that she will have pt call our office to schedule a f/u ov.

## 2015-07-19 ENCOUNTER — Ambulatory Visit (INDEPENDENT_AMBULATORY_CARE_PROVIDER_SITE_OTHER): Payer: Medicare Other | Admitting: Family Medicine

## 2015-07-19 ENCOUNTER — Encounter: Payer: Self-pay | Admitting: Family Medicine

## 2015-07-19 VITALS — BP 123/75 | HR 76 | Temp 98.0°F | Resp 16 | Ht 68.0 in | Wt 274.5 lb

## 2015-07-19 DIAGNOSIS — J069 Acute upper respiratory infection, unspecified: Secondary | ICD-10-CM | POA: Diagnosis not present

## 2015-07-19 DIAGNOSIS — J4541 Moderate persistent asthma with (acute) exacerbation: Secondary | ICD-10-CM

## 2015-07-19 MED ORDER — MOMETASONE FURO-FORMOTEROL FUM 200-5 MCG/ACT IN AERO: 2.0000 | INHALATION_SPRAY | Freq: Two times a day (BID) | RESPIRATORY_TRACT | Status: DC

## 2015-07-19 MED ORDER — PREDNISONE 20 MG PO TABS: ORAL_TABLET | ORAL | Status: DC

## 2015-07-19 MED ORDER — METHYLPREDNISOLONE ACETATE 80 MG/ML IJ SUSP
80.0000 mg | Freq: Once | INTRAMUSCULAR | Status: AC
  Administered 2015-07-19: 80 mg via INTRAMUSCULAR

## 2015-07-19 NOTE — Progress Notes (Signed)
OFFICE VISIT  07/19/2015   CC:  Chief Complaint  Patient presents with  . Follow-up    Bronchitis   HPI:    Patient is a 70 y.o. Caucasian male who presents for lots of coughing, productive of some yellow phlegm, worse at night, wheezes a lot per wife's report today.  Also nasal congestion, scratchy throat.   The more he talks the more he coughs.  Using flonase and proAir.  Denies SOB, CP, dizziness.  Has had an episode of this each month this year.  He seems to have certain times of the year when he does fine and doesn't have these sx's, then has periods like this in which he seems to get a mild flare every month.   Past Medical History  Diagnosis Date  . Diabetes mellitus     type 2- non insulin-requiring  . Hyperlipidemia   . Hypertension     severe LVH on echo  . OSA (obstructive sleep apnea)     Could not tolerate CPAP, not surgical candidate per Dr. Benjamine Mola  . Morbid obesity ([employer])   . Gout   . GERD (gastroesophageal reflux disease)     EDG 04-03-01, + distal esophageal stricture dilation  . Fatty liver u/s and CT 2009    w/mildly elevated transaminases; u/s 12/2012 reconfirmed dx.  Hep B and C testing negative.  Marland Kitchen Dyspnea     Cardiology w/u unrevealing except LVOT obstruction: suspect dyspnea due to morbid obesity and OSA  . Subvalvular aortic stenosis 2012    LVOT obstruction stable on echo 01/09/12 and again on f/u echo 09/29/14--with evidence of HOCM.  Marland Kitchen Carotid bruit 05/28/10    Tortuosity of carotids on doppler u/s, no stenosis.  . Stricture and stenosis of esophagus   . Hiatal hernia   . Moderate persistent asthma 04/19/2014  . Osteoarthritis, multiple sites     Past Surgical History  Procedure Laterality Date  . Colonoscopy  x 2    Dr. Sharlett Iles w/ [facility]  . Tee without cardioversion  05/2010    Dr. Einar Gip; severe LVH, no valvular abnormalities  . Cardiovascular stress test  2006 and 04/27/10    Normal stress nuclear study, normal EF  . Transthoracic  echocardiogram  05/28/10    Normal LVEF, LVOT obstruction, lateral wall hypokinesis  . Transesophageal echocardiogram  06/06/10    LVOT obst with 135 mm Hg PG.  Not HOCM.   No mitral regurg..  Repeat echo 01/09/12 no change.  . Nasal sinus surgery    . Spine surgery  1970    ? Discectomy    Outpatient Prescriptions Prior to Visit  Medication Sig Dispense Refill  . albuterol (VENTOLIN HFA) 108 (90 Base) MCG/ACT inhaler Inhale 2 puffs into the lungs every 4 (four) hours as needed for wheezing. 1 Inhaler 0  . allopurinol (ZYLOPRIM) 300 MG tablet Take 1 tablet by mouth  every day for gout 90 tablet 1  . aspirin 325 MG tablet Take 325 mg by mouth daily.      . Cholecalciferol (VITAMIN D) 2000 UNITS tablet Take 2,000 Units by mouth daily.    Marland Kitchen eplerenone (INSPRA) 50 MG tablet Take 50 mg by mouth daily. Started 09/28/14 by Dr. Einar Gip    . fluticasone (FLONASE) 50 MCG/ACT nasal spray Place 2 sprays into both nostrils daily. 16 g 6  . glipiZIDE (GLUCOTROL XL) 5 MG 24 hr tablet Take 1 tablet by mouth  daily 90 tablet 0  . glucose blood (ONE TOUCH  ULTRA TEST) test strip Use as instructed to check blood sugar.  DX 250.00 100 each prn  . metFORMIN (GLUCOPHAGE) 1000 MG tablet Take 1 tablet by mouth two  times daily with meals 180 tablet 0  . metoprolol (LOPRESSOR) 50 MG tablet Take 1 tablet (50 mg total) by mouth 2 (two) times daily. As per Dr. Einar Gip 180 tablet 4  . ONE TOUCH LANCETS MISC Use as directed to check blood sugar.  DX 250.00 200 each prn  . pantoprazole (PROTONIX) 40 MG tablet Take 1 tablet by mouth  daily 90 tablet 0  . pravastatin (PRAVACHOL) 80 MG tablet Take 1 tablet by mouth  daily 90 tablet 0  . verapamil (VERELAN PM) 120 MG 24 hr capsule Take 1 capsule by mouth  daily 90 capsule 0  . doxycycline (VIBRA-TABS) 100 MG tablet Take 1 tablet (100 mg total) by mouth 2 (two) times daily. (Patient not taking: Reported on 07/19/2015) 20 tablet 0  . HYDROcodone-homatropine (HYCODAN) 5-1.5 MG/5ML syrup  1-2 tsp po qhs prn cough (Patient not taking: Reported on 07/19/2015) 120 mL 0  . verapamil (CALAN-SR) 120 MG CR tablet Take 1 tablet (120 mg total) by mouth daily. (Patient not taking: Reported on 07/19/2015) 90 tablet 4   No facility-administered medications prior to visit.    No Known Allergies  ROS As per HPI  PE: Blood pressure 123/75, pulse 76, temperature 98 F (36.7 C), temperature source Oral, resp. rate 16, height 5\' 8"  (1.727 m), weight 274 lb 8 oz (124.512 kg), SpO2 92 %. VS: noted--normal. Gen: alert, NAD, NONTOXIC APPEARING. HEENT: eyes without injection, drainage, or swelling.  Ears: EACs clear, TMs with normal light reflex and landmarks.  Nose: Clear rhinorrhea, with some dried, crusty exudate adherent to mildly injected mucosa.  No purulent d/c.  No paranasal sinus TTP.  No facial swelling.  Throat and mouth without focal lesion.  No pharyngial swelling, erythema, or exudate.   Neck: supple, no LAD.   LUNGS: CTA bilat (occ trace coarse end-exp wheeze), nonlabored resps.  Some post-exhalation coughing noted. CV: RRR, no m/r/g. EXT: no c/c/e SKIN: no rash  LABS:  None today  IMPRESSION AND PLAN:  1) Acute exacerbation of mod persistent asthma:  Pt has been on symbicort for only about a month per his recollection, says he didn't feel like it was helping. Today the plan is to give depo medrol 80mg  IM.  Starting tomorrow take 40mg  prednisone qd x 2d, then 20mg  qd x 3d.  Continue albuterol HFA q4h prn. Start dulera 200/5, 2 puffs bid.  Encouraged him to give this med a chance for at least 2 mo.  An After Visit Summary was printed and given to the patient.  FOLLOW UP: Return in about 2 months (around 09/18/2015) for f/u asthma.

## 2015-07-19 NOTE — Progress Notes (Signed)
Pre visit review using our clinic review tool, if applicable. No additional management support is needed unless otherwise documented below in the visit note. 

## 2015-07-19 NOTE — Telephone Encounter (Signed)
Pt was seen by Dr. Raoul Pitch on 06/26/15 for this and pt was seen again by Dr. Anitra Lauth today (07/19/15) for this.

## 2015-07-21 ENCOUNTER — Telehealth: Payer: Self-pay | Admitting: Family Medicine

## 2015-07-21 NOTE — Telephone Encounter (Signed)
Please advise. Thanks.  

## 2015-07-21 NOTE — Telephone Encounter (Signed)
Wife wants to know if if he is supposed to take the dulera in addition to the symbicort or instead of the symbicort.

## 2015-07-21 NOTE — Telephone Encounter (Signed)
INSTEAD of the symbicort.

## 2015-07-21 NOTE — Telephone Encounter (Signed)
Pts wife advised and voiced understanding, okay per DPR. 

## 2015-08-08 ENCOUNTER — Other Ambulatory Visit: Payer: Self-pay | Admitting: Family Medicine

## 2015-08-08 NOTE — Telephone Encounter (Signed)
Refill requested to soon. All requested Rx's were just refilled on 06/30/15 for 90 day supply with no refills because pt needs to be seen. If apt on 08/15/15 is kept then refills will be granted at next request.

## 2015-08-15 ENCOUNTER — Ambulatory Visit (INDEPENDENT_AMBULATORY_CARE_PROVIDER_SITE_OTHER): Payer: Medicare Other | Admitting: Family Medicine

## 2015-08-15 ENCOUNTER — Encounter: Payer: Self-pay | Admitting: Family Medicine

## 2015-08-15 VITALS — BP 112/70 | HR 57 | Temp 97.8°F | Resp 16 | Ht 68.0 in | Wt 265.8 lb

## 2015-08-15 DIAGNOSIS — I10 Essential (primary) hypertension: Secondary | ICD-10-CM | POA: Diagnosis not present

## 2015-08-15 DIAGNOSIS — E785 Hyperlipidemia, unspecified: Secondary | ICD-10-CM | POA: Diagnosis not present

## 2015-08-15 DIAGNOSIS — E119 Type 2 diabetes mellitus without complications: Secondary | ICD-10-CM | POA: Diagnosis not present

## 2015-08-15 LAB — HEMOGLOBIN A1C: Hgb A1c MFr Bld: 6.6 % — ABNORMAL HIGH (ref 4.6–6.5)

## 2015-08-15 LAB — COMPREHENSIVE METABOLIC PANEL
ALK PHOS: 51 U/L (ref 39–117)
ALT: 27 U/L (ref 0–53)
AST: 23 U/L (ref 0–37)
Albumin: 4.1 g/dL (ref 3.5–5.2)
BILIRUBIN TOTAL: 0.6 mg/dL (ref 0.2–1.2)
BUN: 15 mg/dL (ref 6–23)
CO2: 27 mEq/L (ref 19–32)
Calcium: 9.4 mg/dL (ref 8.4–10.5)
Chloride: 109 mEq/L (ref 96–112)
Creatinine, Ser: 0.91 mg/dL (ref 0.40–1.50)
GFR: 87.93 mL/min (ref >60.00)
GLUCOSE: 95 mg/dL (ref 70–99)
POTASSIUM: 4.3 meq/L (ref 3.5–5.1)
Sodium: 141 mEq/L (ref 135–145)
TOTAL PROTEIN: 6.3 g/dL (ref 6.0–8.3)

## 2015-08-15 LAB — LIPID PANEL
CHOL/HDL RATIO: 3
Cholesterol: 140 mg/dL (ref 0–200)
HDL: 41.1 mg/dL (ref >39.00)
LDL Cholesterol: 76 mg/dL (ref 0–99)
NonHDL: 99.37
TRIGLYCERIDES: 117 mg/dL (ref 0.0–149.0)
VLDL: 23.4 mg/dL (ref 0.0–40.0)

## 2015-08-15 NOTE — Progress Notes (Signed)
OFFICE VISIT  08/15/2015   CC:  Chief Complaint  Patient presents with  . Follow-up    Pt is fasting.    HPI:    Patient is a 70 y.o. Caucasian male who presents for f/u DM 2, HTN, HLD, hx of fatty liver/NASH. No home glucose monitoring.  Says he can feel when it is off and has not felt this way often. No hypoglycemia.  Tries to eat a diabetic diet, working on it. Pravastatin: takes daily.  No side effects.  Blood pressure; no home monitoring.   He gave blood yesterday and says bp was good there.  Denies SOB, CP, dizziness, palpitations, or signif LE swelling.   Past Medical History  Diagnosis Date  . Diabetes mellitus     type 2- non insulin-requiring  . Hyperlipidemia   . Hypertension     severe LVH on echo  . OSA (obstructive sleep apnea)     Could not tolerate CPAP, not surgical candidate per Dr. Benjamine Mola  . Morbid obesity ([place])   . Gout   . GERD (gastroesophageal reflux disease)     EDG 04-03-01, + distal esophageal stricture dilation  . Fatty liver u/s and CT 2009    w/mildly elevated transaminases; u/s 12/2012 reconfirmed dx.  Hep B and C testing negative.  Marland Kitchen Dyspnea     Cardiology w/u unrevealing except LVOT obstruction: suspect dyspnea due to morbid obesity and OSA  . Subvalvular aortic stenosis 2012    LVOT obstruction stable on echo 01/09/12 and again on f/u echo 09/29/14--with evidence of HOCM.  Marland Kitchen Carotid bruit 05/28/10    Tortuosity of carotids on doppler u/s, no stenosis.  . Stricture and stenosis of esophagus   . Hiatal hernia   . Moderate persistent asthma 04/19/2014  . Osteoarthritis, multiple sites     Past Surgical History  Procedure Laterality Date  . Colonoscopy  x 2    Dr. Sharlett Iles w/ [facility]: latest 2008  . Tee without cardioversion  05/2010    Dr. Einar Gip; severe LVH, no valvular abnormalities  . Cardiovascular stress test  2006 and 04/27/10    Normal stress nuclear study, normal EF  . Transthoracic echocardiogram  05/28/10    Normal LVEF, LVOT  obstruction, lateral wall hypokinesis  . Transesophageal echocardiogram  06/06/10    LVOT obst with 135 mm Hg PG.  Not HOCM.   No mitral regurg..  Repeat echo 01/09/12 no change.  . Nasal sinus surgery    . Spine surgery  1970    ? Discectomy    Outpatient Prescriptions Prior to Visit  Medication Sig Dispense Refill  . albuterol (VENTOLIN HFA) 108 (90 Base) MCG/ACT inhaler Inhale 2 puffs into the lungs every 4 (four) hours as needed for wheezing. 1 Inhaler 0  . allopurinol (ZYLOPRIM) 300 MG tablet Take 1 tablet by mouth  every day for gout 90 tablet 1  . aspirin 325 MG tablet Take 325 mg by mouth daily.      . Cholecalciferol (VITAMIN D) 2000 UNITS tablet Take 2,000 Units by mouth daily.    Marland Kitchen eplerenone (INSPRA) 50 MG tablet Take 50 mg by mouth daily. Started 09/28/14 by Dr. Einar Gip    . fluticasone (FLONASE) 50 MCG/ACT nasal spray Place 2 sprays into both nostrils daily. 16 g 6  . glipiZIDE (GLUCOTROL XL) 5 MG 24 hr tablet Take 1 tablet by mouth  daily 90 tablet 0  . glucose blood (ONE TOUCH ULTRA TEST) test strip Use as instructed to check  blood sugar.  DX 250.00 100 each prn  . metFORMIN (GLUCOPHAGE) 1000 MG tablet Take 1 tablet by mouth two  times daily with meals 180 tablet 0  . metoprolol (LOPRESSOR) 50 MG tablet Take 1 tablet (50 mg total) by mouth 2 (two) times daily. As per Dr. Einar Gip 180 tablet 4  . ONE TOUCH LANCETS MISC Use as directed to check blood sugar.  DX 250.00 200 each prn  . pantoprazole (PROTONIX) 40 MG tablet Take 1 tablet by mouth  daily 90 tablet 0  . pravastatin (PRAVACHOL) 80 MG tablet Take 1 tablet by mouth  daily 90 tablet 0  . verapamil (VERELAN PM) 120 MG 24 hr capsule Take 1 capsule by mouth  daily 90 capsule 0  . mometasone-formoterol (DULERA) 200-5 MCG/ACT AERO Inhale 2 puffs into the lungs 2 (two) times daily. 1 Inhaler 11  . predniSONE (DELTASONE) 20 MG tablet 2 tabs po qd x 2d, then 1 tab po qd x 3d (Patient not taking: Reported on 08/15/2015) 7 tablet 0   No  facility-administered medications prior to visit.    No Known Allergies  ROS As per HPI  PE: Blood pressure 112/70, pulse 57, temperature 97.8 F (36.6 C), temperature source Oral, resp. rate 16, height 5\' 8"  (1.727 m), weight 265 lb 12 oz (120.543 kg), SpO2 97 %. Gen: Alert, well appearing.  Patient is oriented to person, place, time, and situation. CV: Regular, bradycardic in the 123456, 3/6 systolic murmur heard best at cardiac base, no diastolic murmur. No rub or gallop. Chest is clear, no wheezing or rales. Normal symmetric air entry throughout both lung fields. No chest wall deformities or tenderness. EXT: no clubbing or cyanosis.  1+ pitting edema bilat.  LABS:  Lab Results  Component Value Date   TSH 3.08 04/19/2014    Lab Results  Component Value Date   CREATININE 1.0 04/19/2014   BUN 13 04/19/2014   NA 141 04/19/2014   K 4.6 04/19/2014   CL 107 04/19/2014   CO2 26 04/19/2014   Lab Results  Component Value Date   ALT 38 08/03/2013   AST 38* 08/03/2013   ALKPHOS 63 08/03/2013   BILITOT 0.7 08/03/2013   Lab Results  Component Value Date   CHOL 139 08/18/2014   Lab Results  Component Value Date   HDL 33.30* 08/18/2014   Lab Results  Component Value Date   LDLCALC 73 08/18/2014   Lab Results  Component Value Date   TRIG 166.0* 08/18/2014   Lab Results  Component Value Date   CHOLHDL 4 08/18/2014   Lab Results  Component Value Date   PSA 0.71 04/19/2014   PSA 0.73 06/08/2012   PSA normal 12/28/2009   Lab Results  Component Value Date   HGBA1C 6.9* 08/18/2014     IMPRESSION AND PLAN:  1) DM 2, no home monitoring.  Last A1c 1 yr ago was good. Hba1c today. All other monitoring UTD.  2) HTN: The current medical regimen is effective;  continue present plan and medications. Lytes/cr today.  3) Hyperlipidemia: tolerating statin.  Check FLP and AST/ALT today.  4) Fatty liver/Hx of elevated transaminases: CMET today.  An After Visit Summary  was printed and given to the patient.  FOLLOW UP: Return in about 3 months (around 11/14/2015) for annual CPE (fasting).  Signed:  Crissie Sickles, MD           08/15/2015

## 2015-08-15 NOTE — Progress Notes (Signed)
Pre visit review using our clinic review tool, if applicable. No additional management support is needed unless otherwise documented below in the visit note. 

## 2015-08-18 ENCOUNTER — Other Ambulatory Visit: Payer: Self-pay | Admitting: Family Medicine

## 2015-08-18 NOTE — Telephone Encounter (Signed)
LOV: 08/15/15 NOV: None  RF request for glipizide Last written: 06/30/15 #90 w/ 0RF  RF request for metformin Last written: 06/30/15 #90 w/ [id number]  RF request for verapamil Last written: 06/30/15 #90 w/ 0RF  RF request for pravastatin Last written: 06/30/15 #90 w/ 0RF

## 2015-10-20 ENCOUNTER — Other Ambulatory Visit: Payer: Self-pay | Admitting: Family Medicine

## 2015-10-20 NOTE — Telephone Encounter (Signed)
RF request for allopurinol LOV: 08/15/15 Next ov: None Last written: 06/30/15 #90 w/ 1RF  Please advise. Thanks.   RF request for pantoprazole - Rx sent.  Last written: 06/30/15 #90 w/ [id number]

## 2015-11-02 ENCOUNTER — Encounter: Payer: Self-pay | Admitting: Family Medicine

## 2015-11-02 ENCOUNTER — Ambulatory Visit (INDEPENDENT_AMBULATORY_CARE_PROVIDER_SITE_OTHER): Payer: Medicare Other | Admitting: Family Medicine

## 2015-11-02 VITALS — BP 122/84 | HR 72 | Temp 98.2°F | Resp 16 | Ht 68.0 in | Wt 274.5 lb

## 2015-11-02 DIAGNOSIS — R21 Rash and other nonspecific skin eruption: Secondary | ICD-10-CM

## 2015-11-02 MED ORDER — CEPHALEXIN 500 MG PO CAPS: 500.0000 mg | ORAL_CAPSULE | Freq: Three times a day (TID) | ORAL | Status: DC

## 2015-11-02 MED ORDER — FLUTICASONE PROPIONATE 0.05 % EX CREA: TOPICAL_CREAM | Freq: Two times a day (BID) | CUTANEOUS | Status: DC

## 2015-11-02 NOTE — Progress Notes (Signed)
Pre visit review using our clinic review tool, if applicable. No additional management support is needed unless otherwise documented below in the visit note. 

## 2015-11-02 NOTE — Progress Notes (Signed)
OFFICE VISIT  11/02/2015   CC:  Chief Complaint  Patient presents with  . Insect Bite    x 1 week   HPI:    Patient is a 70 y.o. Caucasian male who presents for 1 week hx of left arm rash. Started after putting a limb in a truck.  Itches a lot.  No pain.  No fever or malaise. Has a spot or two on left hand, one or two on left forearm, 1 splotch on left upper arm.  Tried some home remedy salves--no help.  Past Medical History  Diagnosis Date  . Diabetes mellitus     type 2- non insulin-requiring  . Hyperlipidemia   . Hypertension     severe LVH on echo  . OSA (obstructive sleep apnea)     Could not tolerate CPAP, not surgical candidate per Dr. Benjamine Mola  . Morbid obesity ([place])   . Gout   . GERD (gastroesophageal reflux disease)     EDG 04-03-01, + distal esophageal stricture dilation  . Fatty liver u/s and CT 2009    w/mildly elevated transaminases; u/s 12/2012 reconfirmed dx.  Hep B and C testing negative.  Marland Kitchen Dyspnea     Cardiology w/u unrevealing except LVOT obstruction: suspect dyspnea due to morbid obesity and OSA  . Subvalvular aortic stenosis 2012    LVOT obstruction stable on echo 01/09/12 and again on f/u echo 09/29/14--with evidence of HOCM.  Marland Kitchen Carotid bruit 05/28/10    Tortuosity of carotids on doppler u/s, no stenosis.  . Stricture and stenosis of esophagus   . Hiatal hernia   . Moderate persistent asthma 04/19/2014  . Osteoarthritis, multiple sites   . Venous insufficiency of both lower extremities     Past Surgical History  Procedure Laterality Date  . Colonoscopy  x 2    Dr. Sharlett Iles w/ [facility]: latest 2008  . Tee without cardioversion  05/2010    Dr. Einar Gip; severe LVH, no valvular abnormalities  . Cardiovascular stress test  2006 and 04/27/10    Normal stress nuclear study, normal EF  . Transthoracic echocardiogram  05/28/10    Normal LVEF, LVOT obstruction, lateral wall hypokinesis  . Transesophageal echocardiogram  06/06/10    LVOT obst with 135 mm Hg PG.   Not HOCM.   No mitral regurg..  Repeat echo 01/09/12 no change.  . Nasal sinus surgery    . Spine surgery  1970    ? Discectomy    Outpatient Prescriptions Prior to Visit  Medication Sig Dispense Refill  . albuterol (VENTOLIN HFA) 108 (90 Base) MCG/ACT inhaler Inhale 2 puffs into the lungs every 4 (four) hours as needed for wheezing. 1 Inhaler 0  . allopurinol (ZYLOPRIM) 300 MG tablet Take 1 tablet by mouth  every day for gout 90 tablet 3  . aspirin 325 MG tablet Take 325 mg by mouth daily.      . Cholecalciferol (VITAMIN D) 2000 UNITS tablet Take 2,000 Units by mouth daily.    Marland Kitchen eplerenone (INSPRA) 50 MG tablet Take 50 mg by mouth daily. Started 09/28/14 by Dr. Einar Gip    . fluticasone (FLONASE) 50 MCG/ACT nasal spray Place 2 sprays into both nostrils daily. 16 g 6  . GLIPIZIDE XL 5 MG 24 hr tablet Take 1 tablet by mouth  daily 90 tablet 1  . glucose blood (ONE TOUCH ULTRA TEST) test strip Use as instructed to check blood sugar.  DX 250.00 100 each prn  . metFORMIN (GLUCOPHAGE) 1000 MG tablet  Take 1 tablet by mouth two  times daily with meals 180 tablet 1  . metoprolol (LOPRESSOR) 50 MG tablet Take 1 tablet (50 mg total) by mouth 2 (two) times daily. As per Dr. Einar Gip 180 tablet 4  . mometasone-formoterol (DULERA) 200-5 MCG/ACT AERO Inhale 2 puffs into the lungs 2 (two) times daily. 1 Inhaler 11  . ONE TOUCH LANCETS MISC Use as directed to check blood sugar.  DX 250.00 200 each prn  . pantoprazole (PROTONIX) 40 MG tablet Take 1 tablet by mouth  daily 90 tablet 3  . pravastatin (PRAVACHOL) 80 MG tablet Take 1 tablet by mouth  daily 90 tablet 3  . verapamil (VERELAN PM) 120 MG 24 hr capsule Take 1 capsule by mouth  daily . 90 capsule 3   No facility-administered medications prior to visit.    No Known Allergies  ROS As per HPI  PE: Blood pressure 122/84, pulse 72, temperature 98.2 F (36.8 C), temperature source Oral, resp. rate 16, height 5\' 8"  (1.727 m), weight 274 lb 8 oz (124.512 kg),  SpO2 96 %. Gen: Alert, well appearing.  Patient is oriented to person, place, time, and situation. AFFECT: pleasant, lucid thought and speech. Left arm: a few dark pink papules are located on left hand, left forearm, and left upper arm.  Several are excoriated and feel a bit indurated deep to the papule.  The upper arm lesion is a quarter sized splotch of maculopapular rash.  No vesicles.  No streaking.  No tenderness.  LABS:  none  IMPRESSION AND PLAN:  Left arm rash, suspect contact dermatitis, possibly with mild superinfection. Cutivate 0.05% cream bid rx'd, plus keflex 500mg  tid x 7d.  An After Visit Summary was printed and given to the patient.  FOLLOW UP: Return if symptoms worsen or fail to improve.  Signed:  Crissie Sickles, MD           11/02/2015

## 2015-11-21 DIAGNOSIS — I421 Obstructive hypertrophic cardiomyopathy: Secondary | ICD-10-CM | POA: Diagnosis not present

## 2015-11-21 DIAGNOSIS — G4733 Obstructive sleep apnea (adult) (pediatric): Secondary | ICD-10-CM | POA: Diagnosis not present

## 2015-11-21 DIAGNOSIS — I10 Essential (primary) hypertension: Secondary | ICD-10-CM | POA: Diagnosis not present

## 2015-11-23 ENCOUNTER — Encounter: Payer: Self-pay | Admitting: Family Medicine

## 2015-11-28 ENCOUNTER — Ambulatory Visit (INDEPENDENT_AMBULATORY_CARE_PROVIDER_SITE_OTHER): Payer: Medicare Other | Admitting: Family Medicine

## 2015-11-28 ENCOUNTER — Encounter: Payer: Self-pay | Admitting: Family Medicine

## 2015-11-28 VITALS — BP 120/70 | HR 60 | Temp 98.3°F | Resp 16 | Ht 68.5 in | Wt 280.0 lb

## 2015-11-28 DIAGNOSIS — Z0000 Encounter for general adult medical examination without abnormal findings: Secondary | ICD-10-CM | POA: Diagnosis not present

## 2015-11-28 DIAGNOSIS — Z125 Encounter for screening for malignant neoplasm of prostate: Secondary | ICD-10-CM

## 2015-11-28 LAB — PSA, MEDICARE: PSA: 0.7 ng/ml (ref 0.10–4.00)

## 2015-11-28 NOTE — Progress Notes (Signed)
The patient is here for annual Medicare wellness examination and management of other chronic and acute problems. Other problems discussed today: none, except for the fact that he is due for prostate cancer screening.  We decided to do his DRE and PSA testing today.  Lab Results  Component Value Date   HGBA1C 6.6 (H) 08/15/2015    Lab Results  Component Value Date   TSH 3.08 04/19/2014    Lab Results  Component Value Date   CREATININE 0.91 08/15/2015   BUN 15 08/15/2015   NA 141 08/15/2015   K 4.3 08/15/2015   CL 109 08/15/2015   CO2 27 08/15/2015   Lab Results  Component Value Date   ALT 27 08/15/2015   AST 23 08/15/2015   ALKPHOS 51 08/15/2015   BILITOT 0.6 08/15/2015   Lab Results  Component Value Date   CHOL 140 08/15/2015   Lab Results  Component Value Date   HDL 41.10 08/15/2015   Lab Results  Component Value Date   LDLCALC 76 08/15/2015   Lab Results  Component Value Date   TRIG 117.0 08/15/2015   Lab Results  Component Value Date   CHOLHDL 3 08/15/2015   Lab Results  Component Value Date   PSA 0.71 04/19/2014   PSA 0.73 06/08/2012   PSA normal 12/28/2009     AWV DATA The risk factors are reflected in the social history.  The roster of all physicians providing medical care to patient is listed in the Snapshot section of the chart.  Activities of daily living:  The patient is 100% independent in all ADLs: dressing, toileting, feeding as well as independent mobility.  Home safety : The patient has smoke detectors in the home. They wear seatbelts. No firearms at home ( firearms are present in the home, kept in a safe fashion). There is no violence in the home.   There is no risks for hepatitis, STDs or HIV. There is no history of blood transfusion. They have no travel history to infectious disease endemic areas of the world.  The patient has seen their dentist in the last six month. They have seen their eye doctor in the last year. They deny any  hearing difficulty and have not had audiologic testing in the last year.  They do not  have excessive sun exposure. Discussed the need for sun protection: hats, long sleeves and use of sunscreen if there is significant sun exposure.   Diet: the importance of a healthy diet is discussed. They have a mildly healthy diet.  The patient has a regular exercise program: walks around his property during the day.  No formal exercise.   The benefits of regular aerobic exercise were discussed.  Depression screen: there are no signs or vegative symptoms of depression- irritability, change in appetite, anhedonia, sadness/tearfullness.  Cognitive assessment: the patient manages all their financial and personal affairs and is actively engaged. They could relate day,date,year and events; recalled 3/3 objects at 3 minutes; performed clock-face test normally.  Reviewed advance directives with pt today.  He has all of this in place.  The following portions of the patient's history were reviewed and updated as appropriate: allergies, current medications, past family history, past medical history,  past surgical history, past social history  and problem list.  Vision, hearing, body mass index were assessed and reviewed. Body mass index is 41.95 kg/m.   During the course of the visit the patient was educated and counseled about appropriate screening and preventive services  including :  Annual wellness visit --doing currently. diabetes screening: pt carries dx of DM, so screening is not applicable. colorectal cancer screening: pt due for repeat colonoscopy after 04/2016 ([place]). recommended immunizations (influenza, pneumococcal, Hep B): pt is UTD on these. Bone mass measurement: N/A Counseling to prevent tobacco use: N/A Depression screening: discussed  Glaucoma screening: this is done by his eye MD. Hepatitis C virus screening: pt qualifies but declines screening HIV virus screening: pt declines  screening Lung cancer screening: pt doesn't qualify. Medical nutrition therapy: he has gone through this. Prostate cancer screening: we'll do DRE and PSA today. Screening mammography: N/A Screening pap tests, pelvic exam, and clinical breast exam: N/A Ultrasound screening for AAA: pt qualifies but declines this.  A written plan of action regarding the above screening and preventative services was given to the patient today.  I did DRE today (normal) and drew medicare PSA lab today.  An After Visit Summary was printed and given to the patient.  Signed:  Crissie Sickles, MD           11/28/2015

## 2015-11-28 NOTE — Progress Notes (Signed)
Pre visit review using our clinic review tool, if applicable. No additional management support is needed unless otherwise documented below in the visit note. 

## 2015-12-25 DIAGNOSIS — M25571 Pain in right ankle and joints of right foot: Secondary | ICD-10-CM | POA: Diagnosis not present

## 2015-12-25 DIAGNOSIS — M7731 Calcaneal spur, right foot: Secondary | ICD-10-CM | POA: Diagnosis not present

## 2015-12-25 DIAGNOSIS — M722 Plantar fascial fibromatosis: Secondary | ICD-10-CM | POA: Diagnosis not present

## 2015-12-25 DIAGNOSIS — M65871 Other synovitis and tenosynovitis, right ankle and foot: Secondary | ICD-10-CM | POA: Diagnosis not present

## 2015-12-26 DIAGNOSIS — R609 Edema, unspecified: Secondary | ICD-10-CM | POA: Diagnosis not present

## 2016-01-12 DIAGNOSIS — M25571 Pain in right ankle and joints of right foot: Secondary | ICD-10-CM | POA: Diagnosis not present

## 2016-01-12 DIAGNOSIS — M65871 Other synovitis and tenosynovitis, right ankle and foot: Secondary | ICD-10-CM | POA: Diagnosis not present

## 2016-01-24 ENCOUNTER — Encounter (INDEPENDENT_AMBULATORY_CARE_PROVIDER_SITE_OTHER): Payer: Self-pay

## 2016-01-24 ENCOUNTER — Encounter: Payer: Self-pay | Admitting: Pulmonary Disease

## 2016-01-24 ENCOUNTER — Ambulatory Visit (INDEPENDENT_AMBULATORY_CARE_PROVIDER_SITE_OTHER): Payer: Medicare Other | Admitting: Pulmonary Disease

## 2016-01-24 DIAGNOSIS — G4733 Obstructive sleep apnea (adult) (pediatric): Secondary | ICD-10-CM | POA: Diagnosis not present

## 2016-01-24 DIAGNOSIS — Z23 Encounter for immunization: Secondary | ICD-10-CM

## 2016-01-24 NOTE — Progress Notes (Signed)
   Subjective:    Patient ID: HARJAS CARE, male    DOB: May 06, 1945, 70 y.o.   MRN: [id number]  HPI    Review of Systems  Constitutional: Negative for activity change, appetite change, chills, fever and unexpected weight change.  HENT: Negative for congestion, dental problem, postnasal drip, rhinorrhea, sneezing, sore throat, trouble swallowing and voice change.   Eyes: Negative for visual disturbance.  Respiratory: Negative for cough, choking and shortness of breath.   Cardiovascular: Negative for chest pain and leg swelling.  Gastrointestinal: Negative for abdominal pain, nausea and vomiting.  Genitourinary: Negative for difficulty urinating.  Musculoskeletal: Negative for arthralgias.  Skin: Negative for rash.  Psychiatric/Behavioral: Negative for behavioral problems and confusion.       Objective:   Physical Exam        Assessment & Plan:

## 2016-01-24 NOTE — Patient Instructions (Addendum)
It is nice to meet you today. We will order a CPAP machine and supplies, including mask  It will take about 2 hours for your fitting.. We will request Auto Set 5-15 cm H2O ( Pressure) with the hope that you tolerate this better. Humidity Full Face mask  Flu shot today. Goal is to wear your CPAP for at least 4-6 hours each night for maximal clinical benefit. Continue to work on weight loss, as the link between excess weight  and sleep apnea is well established.  Do not drive if sleepy. Down Load in 4 weeks Follow up with Eric Form, NP In 6 weeks  Please contact office for sooner follow up if symptoms do not improve or worsen or seek emergency care

## 2016-01-24 NOTE — Assessment & Plan Note (Addendum)
OSA :  Moderate to severe, per Sleep Study done 08/2013 Would like to try CPAP again. Plan We will order a CPAP machine and supplies, including mask  It will take about 2 hours for your fitting.. We will request Auto Set 5-15 cm H2O ( Pressure) with the hope that you tolerate this better. Flu shot today. Goal is to wear your CPAP for at least 4-6 hours each night for maximal clinical benefit. Continue to work on weight loss, as the link between excess weight  and sleep apnea is well established.  Do not drive if sleepy. Follow up with Jack Form, NP In 1 month with down Load or before as needed. Please contact office for sooner follow up if symptoms do not improve or worsen or seek emergency care    If insurance requires further work up we will do a home study.

## 2016-01-24 NOTE — Progress Notes (Signed)
History of Present Illness Jack [name] is a 70 y.o. male with history of asthma , bronchitis and GERD with OSA ( Sleep study confirmed) here for management of OSA. He will be seen by Dr. Elsworth Soho.   01/24/2016 HPI Consultation for OSA; Pt. Presents to the office for consultation of OSA per Dr. Tamsen Snider. He states that he had a a sleep study done in 2015, and was started on CPAP by [facility] . He states that the pressure was too high on his machine, so he returned the machine.He does not remember which mask he used. He states he has gained > 10 pounds in the last 2 years. He goes to bed about 11 pm, but rarely sleeps more than 4 hours. He does have daytime sleepiness.He states he does not get sleepy while driving. He is a former smoker.He states he does not have headache during the day.Family history of OSA ( Cousin) he rarely drinks alcohol. He does  Tests 08/2013 Split night Sleep Study Sleep efficiency was 67.98%, REM latency was 75 minutes. His total AHI was 24.6 per hour, rising to 40 per hour in REM sleep. Average oxygen saturation was 93%, and nadir was 81%. He was noted to snore loudly. His periodic limb movement index was elevated at 21.1 per hour with an associated arousal index of 2.9 per hour. He was advised to have a CPAP titration study. He had this test on 04/24/2010 and I reviewed the test results as well. Sleep efficiency was 73.3%, REM latency was 144 minutes. Average oxygen saturation was 94%, nadir was 80%, he was titrated from 5-13 cm of water pressure on CPAP on the final pressures AHI was 2.02 per hour. He was placed on CPAP of 13 cm.  Echo 01/09/12 >> severe LVH, EF 56%, mod LA dilation, LVOT obstruction  Past medical hx Past Medical History:  Diagnosis Date  . Carotid bruit 05/28/10   Tortuosity of carotids on doppler u/s, no stenosis.  . Diabetes mellitus    type 2- non insulin-requiring  . Dyspnea    Cardiology w/u unrevealing except LVOT obstruction: suspect  dyspnea due to morbid obesity and OSA  . Fatty liver u/s and CT 2009   w/mildly elevated transaminases; u/s 12/2012 reconfirmed dx.  Hep B and C testing negative.  Marland Kitchen GERD (gastroesophageal reflux disease)    EDG 04-03-01, + distal esophageal stricture dilation  . Gout   . Hiatal hernia   . Hyperlipidemia   . Hypertension    severe LVH on echo  . Moderate persistent asthma 04/19/2014  . Morbid obesity ([place])   . OSA (obstructive sleep apnea) 04/19/10 sleep study   Could not tolerate CPAP, not surgical candidate per Dr. Benjamine Mola  . Osteoarthritis, multiple sites   . Stricture and stenosis of esophagus   . Subvalvular aortic stenosis 2012   LVOT obstruction stable on echo 01/09/12 and again on f/u echo 09/29/14--with evidence of HOCM.  Marland Kitchen Venous insufficiency of both lower extremities      Past surgical hx, Family hx, Social hx all reviewed.  Current Outpatient Prescriptions on File Prior to Visit  Medication Sig  . albuterol (VENTOLIN HFA) 108 (90 Base) MCG/ACT inhaler Inhale 2 puffs into the lungs every 4 (four) hours as needed for wheezing.  Marland Kitchen allopurinol (ZYLOPRIM) 300 MG tablet Take 1 tablet by mouth  every day for gout  . aspirin 325 MG tablet Take 325 mg by mouth daily.    . Cholecalciferol (VITAMIN D) 2000 UNITS  tablet Take 2,000 Units by mouth daily.  Marland Kitchen eplerenone (INSPRA) 50 MG tablet Take 50 mg by mouth daily. Started 09/28/14 by Dr. Einar Gip  . fluticasone (CUTIVATE) 0.05 % cream Apply topically 2 (two) times daily.  . fluticasone (FLONASE) 50 MCG/ACT nasal spray Place 2 sprays into both nostrils daily.  Marland Kitchen GLIPIZIDE XL 5 MG 24 hr tablet Take 1 tablet by mouth  daily  . glucose blood (ONE TOUCH ULTRA TEST) test strip Use as instructed to check blood sugar.  DX 250.00  . metFORMIN (GLUCOPHAGE) 1000 MG tablet Take 1 tablet by mouth two  times daily with meals  . metoprolol (LOPRESSOR) 50 MG tablet Take 1 tablet (50 mg total) by mouth 2 (two) times daily. As per Dr. Einar Gip  .  mometasone-formoterol (DULERA) 200-5 MCG/ACT AERO Inhale 2 puffs into the lungs 2 (two) times daily.  . ONE TOUCH LANCETS MISC Use as directed to check blood sugar.  DX 250.00  . pantoprazole (PROTONIX) 40 MG tablet Take 1 tablet by mouth  daily  . pravastatin (PRAVACHOL) 80 MG tablet Take 1 tablet by mouth  daily  . verapamil (VERELAN PM) 120 MG 24 hr capsule Take 1 capsule by mouth  daily .   No current facility-administered medications on file prior to visit.      Current Outpatient Prescriptions:  .  albuterol (VENTOLIN HFA) 108 (90 Base) MCG/ACT inhaler, Inhale 2 puffs into the lungs every 4 (four) hours as needed for wheezing., Disp: 1 Inhaler, Rfl: 0 .  allopurinol (ZYLOPRIM) 300 MG tablet, Take 1 tablet by mouth  every day for gout, Disp: 90 tablet, Rfl: 3 .  aspirin 325 MG tablet, Take 325 mg by mouth daily.  , Disp: , Rfl:  .  Cholecalciferol (VITAMIN D) 2000 UNITS tablet, Take 2,000 Units by mouth daily., Disp: , Rfl:  .  eplerenone (INSPRA) 50 MG tablet, Take 50 mg by mouth daily. Started 09/28/14 by Dr. Einar Gip, Disp: , Rfl:  .  fluticasone (CUTIVATE) 0.05 % cream, Apply topically 2 (two) times daily., Disp: 30 g, Rfl: 1 .  fluticasone (FLONASE) 50 MCG/ACT nasal spray, Place 2 sprays into both nostrils daily., Disp: 16 g, Rfl: 6 .  GLIPIZIDE XL 5 MG 24 hr tablet, Take 1 tablet by mouth  daily, Disp: 90 tablet, Rfl: 1 .  glucose blood (ONE TOUCH ULTRA TEST) test strip, Use as instructed to check blood sugar.  DX 250.00, Disp: 100 each, Rfl: prn .  metFORMIN (GLUCOPHAGE) 1000 MG tablet, Take 1 tablet by mouth two  times daily with meals, Disp: 180 tablet, Rfl: 1 .  metoprolol (LOPRESSOR) 50 MG tablet, Take 1 tablet (50 mg total) by mouth 2 (two) times daily. As per Dr. Einar Gip, Disp: 180 tablet, Rfl: 4 .  mometasone-formoterol (DULERA) 200-5 MCG/ACT AERO, Inhale 2 puffs into the lungs 2 (two) times daily., Disp: 1 Inhaler, Rfl: 11 .  ONE TOUCH LANCETS MISC, Use as directed to check blood  sugar.  DX 250.00, Disp: 200 each, Rfl: prn .  pantoprazole (PROTONIX) 40 MG tablet, Take 1 tablet by mouth  daily, Disp: 90 tablet, Rfl: 3 .  pravastatin (PRAVACHOL) 80 MG tablet, Take 1 tablet by mouth  daily, Disp: 90 tablet, Rfl: 3 .  verapamil (VERELAN PM) 120 MG 24 hr capsule, Take 1 capsule by mouth  daily ., Disp: 90 capsule, Rfl: 3   Past Surgical History:  Procedure Laterality Date  . CARDIOVASCULAR STRESS TEST  2006 and 04/27/10   Normal  stress nuclear study, normal EF  . COLONOSCOPY  x 2   Dr. Sharlett Iles w/ [facility]: latest 04/2006  . NASAL SINUS SURGERY    . [place]   ? Discectomy  . TEE WITHOUT CARDIOVERSION  05/2010   Dr. Einar Gip; severe LVH, no valvular abnormalities  . TRANSESOPHAGEAL ECHOCARDIOGRAM  06/06/10   LVOT obst with 135 mm Hg PG.  Not HOCM.   No mitral regurg..  Repeat echo 01/09/12 no change.  . TRANSTHORACIC ECHOCARDIOGRAM  05/28/10   Normal LVEF, LVOT obstruction, lateral wall hypokinesis   Social History   Social History  . Marital status: Married    Spouse name: N/A  . Number of children: 1  . Years of education: N/A   Occupational History  . retired Retired   Social History Main Topics  . Smoking status: Former Smoker    Packs/day: 0.50    Years: 40.00    Types: Cigarettes, Cigars    Quit date: 04/29/1998  . Smokeless tobacco: Never Used     Comment: 4 cigars  . Alcohol use No     Comment: occasionally  . Drug use: No  . Sexual activity: Not on file   Other Topics Concern  . Not on file   Social History Narrative   Married, 1 son, 1 granddaughter.   Retired from [employer] 2000.   Hobbies: trading farm equipment.  Used to be a Environmental manager.   Tobacco x many years, quit @ 2005   No ETOH or drugs.   No regular exercise.Smoking Status:  quit             No Known Allergies  Review Of Systems:  Constitutional:   No  weight loss, night sweats,  Fevers, chills, fatigue, or  lassitude.  HEENT:    No headaches,  Difficulty swallowing,  Tooth/dental problems, or  Sore throat,                No sneezing, itching, ear ache, nasal congestion, post nasal drip,   CV:  No chest pain,  Orthopnea, PND, + swelling in right lower extremities, no anasarca, dizziness, palpitations, syncope.   GI  No heartburn, indigestion, abdominal pain, nausea, vomiting, diarrhea, change in bowel habits, loss of appetite, bloody stools.   Resp: + shortness of breath with exertion not  at rest.  No excess mucus, no productive cough,  No non-productive cough,  No coughing up of blood.  No change in color of mucus.  No wheezing.  No chest wall deformity  Skin: + rash on abdomen  or lesions.  GU: no dysuria, change in color of urine, no urgency or frequency.  No flank pain, no hematuria   MS:  No joint pain or swelling.  No decreased range of motion.  No back pain.  Psych:  No change in mood or affect. No depression or anxiety.  No memory loss.   Vital Signs BP 138/76 (BP Location: Left Arm, Cuff Size: Normal)   Pulse 87   Ht 5\' 9"  (1.753 m)   Wt 271 lb (122.9 kg)   SpO2 95%   BMI 40.02 kg/m    Physical Exam:  General- No distress,  A&Ox3, obese, pleasant ENT: No sinus tenderness, TM clear, pale nasal mucosa, no oral exudate,no post nasal drip, no LAN Cardiac: S1, S2, regular rate and rhythm, no murmur Chest: No wheeze/ rales/ dullness; no accessory muscle use, no nasal flaring, no sternal retractions Abd.: Soft Non-tender Ext:  No clubbing cyanosis, trace R leg swelling.1edema Neuro:  normal strength Skin: + rashes to abdomen x 6 months, warm and dry Psych: normal mood and behavior   Assessment/Plan  Obstructive sleep apnea OSA :  Moderate to severe, per Sleep Study done 08/2013 Would like to try CPAP again. Plan We will order a CPAP machine and supplies, including mask  It will take about 2 hours for your fitting.. We will request Auto Set 5-15 cm H2O ( Pressure) with the hope that you  tolerate this better. Flu shot today. Goal is to wear your CPAP for at least 4-6 hours each night for maximal clinical benefit. Continue to work on weight loss, as the link between excess weight  and sleep apnea is well established.  Do not drive if sleepy. Follow up with Eric Form, NP In 1 month with down Load or before as needed. Please contact office for sooner follow up if symptoms do not improve or worsen or seek emergency care    If insurance requires further work up we will do a home study.  Preventative Care: Plan: Flu Shot Today   Magdalen Spatz, NP 01/24/2016  3:35 PM   I examined patient with NP, reviewed chart and prior sleep studies. He does have moderate cardiovascular disease with LV hypertrophy and hypertension.  PSG in 2015 showed moderate OSA with AHI 25/hour which was corrected by CPAP of 13 cm. However he did not tolerate this high pressure. He now notices increased tiredness, wife reports increased snoring and he is ready to reinstate CPAP.  On exam-obese, may have gained 10-15 pounds since the sleep study, class II airway,decreased breath sounds bilateral, no pedal edema  Recommend-wwe will reinstate auto CPAP 5-15 cm so as to avoid high pressures. Since he is a mouth breather we will use a full face mask,obtain download in 4 weeks and change pressure as required.   Rigoberto Noel MD

## 2016-01-25 ENCOUNTER — Telehealth: Payer: Self-pay | Admitting: Pulmonary Disease

## 2016-01-25 DIAGNOSIS — G4733 Obstructive sleep apnea (adult) (pediatric): Secondary | ICD-10-CM

## 2016-01-25 NOTE — Telephone Encounter (Signed)
New order placed. Nothing further needed. 

## 2016-01-25 NOTE — Telephone Encounter (Signed)
Please send to different DME with  sleep studies

## 2016-01-25 NOTE — Telephone Encounter (Signed)
Called spoke with [facility] w/ [facility]. Since pt had a break in therapy, pt will need a new sleep study. Please advise Dr. Elsworth Soho thanks

## 2016-01-29 ENCOUNTER — Encounter: Payer: Self-pay | Admitting: Family Medicine

## 2016-01-29 NOTE — Telephone Encounter (Signed)
Order was sent to [facility] as well as [facility], both states that patient must have another sleep study due to break in therapy.  Dr. Elsworth Soho, please advise.  (phone note was closed, had to addend)

## 2016-01-29 NOTE — Telephone Encounter (Signed)
Please let patient know Okay to order home sleep study

## 2016-01-31 NOTE — Addendum Note (Signed)
Addended by: Mathis Dad on: 01/31/2016 10:52 AM   Modules accepted: Orders

## 2016-01-31 NOTE — Telephone Encounter (Signed)
Patient aware of Dr. Bari Mantis recommendation.  Home Sleep Study ordered. Nothing further needed.

## 2016-02-05 DIAGNOSIS — M12271 Villonodular synovitis (pigmented), right ankle and foot: Secondary | ICD-10-CM | POA: Diagnosis not present

## 2016-02-05 DIAGNOSIS — M25571 Pain in right ankle and joints of right foot: Secondary | ICD-10-CM | POA: Diagnosis not present

## 2016-02-05 DIAGNOSIS — L600 Ingrowing nail: Secondary | ICD-10-CM | POA: Diagnosis not present

## 2016-02-24 DIAGNOSIS — G4733 Obstructive sleep apnea (adult) (pediatric): Secondary | ICD-10-CM | POA: Diagnosis not present

## 2016-03-04 ENCOUNTER — Telehealth: Payer: Self-pay | Admitting: Pulmonary Disease

## 2016-03-04 NOTE — Telephone Encounter (Signed)
Per RA -  Pt has severe OSA, an order for CPAP has been sent. Please inform pt.

## 2016-03-05 DIAGNOSIS — G4733 Obstructive sleep apnea (adult) (pediatric): Secondary | ICD-10-CM | POA: Diagnosis not present

## 2016-03-05 NOTE — Telephone Encounter (Signed)
Called and spoke to pt. Informed him of the results and recs per RA. Pt verbalized understanding and denied any further questions or concerns at this time.   PCC's an order for CPAP has already been placed but we were waiting on the sleep study results. Thanks.

## 2016-03-06 ENCOUNTER — Other Ambulatory Visit: Payer: Self-pay | Admitting: *Deleted

## 2016-03-06 DIAGNOSIS — G4733 Obstructive sleep apnea (adult) (pediatric): Secondary | ICD-10-CM

## 2016-03-06 NOTE — Telephone Encounter (Signed)
I have faxed patient's HST to [facility] and I sent a staff message to [facility]  making her aware of this.

## 2016-03-06 NOTE — Telephone Encounter (Signed)
Pt aware that if he has not heard from DME by the end of the week to call office back. Nothing further needed at this time.

## 2016-03-11 ENCOUNTER — Ambulatory Visit: Payer: Medicare Other | Admitting: Acute Care

## 2016-03-18 DIAGNOSIS — G4733 Obstructive sleep apnea (adult) (pediatric): Secondary | ICD-10-CM | POA: Diagnosis not present

## 2016-04-02 DIAGNOSIS — G4733 Obstructive sleep apnea (adult) (pediatric): Secondary | ICD-10-CM | POA: Diagnosis not present

## 2016-04-05 ENCOUNTER — Other Ambulatory Visit: Payer: Self-pay | Admitting: Family Medicine

## 2016-04-05 NOTE — Telephone Encounter (Signed)
RF request for glipizide LOV: 11/28/15 Next ov: None Last written: 08/18/15 #90 w/ 1RF  RF request for metformin Last written: 08/18/15 #180 w/ 1RF

## 2016-04-16 ENCOUNTER — Encounter: Payer: Self-pay | Admitting: Pulmonary Disease

## 2016-04-17 DIAGNOSIS — G4733 Obstructive sleep apnea (adult) (pediatric): Secondary | ICD-10-CM | POA: Diagnosis not present

## 2016-04-30 ENCOUNTER — Telehealth: Payer: Self-pay | Admitting: Pulmonary Disease

## 2016-04-30 NOTE — Telephone Encounter (Signed)
He has a few residual events (13/h) on auto CPAP settings-average pressure 13.6 cm He may do better on fixed pressure but I understand that he has had pressure problems in the past with feeling of high pressure when he was on a fixed pressure of 13 cm.  Can we change him to fixed pressure of 15 cm?

## 2016-05-02 NOTE — Telephone Encounter (Signed)
Results have been explained to patient, pt expressed understanding. Pt prefers to remain on Auto setting and is not interested in doing a fixed pressure as he did not tolerated it well in the past.  Pt states that if we want to adjust his auto pressure "a little" then we can do that but he does not want any major changes made. Please advise Dr Elsworth Soho. Thanks.

## 2016-05-03 NOTE — Telephone Encounter (Signed)
Okay to leave on current settings and recheck CPAP download in 3 months He can call if any new symptoms

## 2016-05-06 DIAGNOSIS — L03031 Cellulitis of right toe: Secondary | ICD-10-CM | POA: Diagnosis not present

## 2016-05-08 NOTE — Telephone Encounter (Signed)
Reminder placed for DL in 69mos.

## 2016-05-08 NOTE — Telephone Encounter (Signed)
Pt aware at of below message & voiced his understanding. Cherina, will you set a reminder for a DL in 62mo on this pt. Thanks.

## 2016-05-18 DIAGNOSIS — G4733 Obstructive sleep apnea (adult) (pediatric): Secondary | ICD-10-CM | POA: Diagnosis not present

## 2016-06-03 ENCOUNTER — Ambulatory Visit (INDEPENDENT_AMBULATORY_CARE_PROVIDER_SITE_OTHER): Payer: Medicare Other | Admitting: Acute Care

## 2016-06-03 ENCOUNTER — Encounter: Payer: Self-pay | Admitting: Acute Care

## 2016-06-03 ENCOUNTER — Ambulatory Visit: Payer: Medicare Other | Admitting: Adult Health

## 2016-06-03 VITALS — BP 142/70 | HR 79 | Ht 69.0 in | Wt 270.0 lb

## 2016-06-03 DIAGNOSIS — G4733 Obstructive sleep apnea (adult) (pediatric): Secondary | ICD-10-CM | POA: Diagnosis not present

## 2016-06-03 NOTE — Patient Instructions (Signed)
It is nice to meet you today.  We will request an appointment for a mask re-fit at [facility] Continue on CPAP at bedtime. You appear to be benefiting from the treatment Goal is to wear for at least 4-6 hours each night for maximal clinical benefit. Continue to work on weight loss, as the link between excess weight  and sleep apnea is well established.  Do not drive if sleepy. Follow up with Dr. Elsworth Soho or Eric Form, NP  in 2 months with Down Load You can try hydrocortisone ointment to the rash on your abdomen. Please contact office for sooner follow up if symptoms do not improve or worsen or seek emergency care

## 2016-06-03 NOTE — Assessment & Plan Note (Addendum)
We will request an appointment for a mask re-fit at [facility]  We will need to get mask fit better, and then evaluate AHI and determine need to adjust pressures. Continue on CPAP at bedtime. You appear to be benefiting from the treatment Goal is to wear for at least 4-6 hours each night for maximal clinical benefit. Continue to work on weight loss, as the link between excess weight  and sleep apnea is well established.  Do not drive if sleepy. Follow up with Dr. Elsworth Soho or Eric Form, NP  in 2 months with Down Load Please contact office for sooner follow up if symptoms do not improve or worsen or seek emergency care

## 2016-06-03 NOTE — Progress Notes (Signed)
History of Present Illness Jack [name] is a 71 y.o. male former smoker with OSA on CPAP therapy. He is followed by  Dr. Corrie Dandy.  Synopsis: Pt. Was seen for consultation of OSA per request  Dr. Tamsen Snider. He states that he had a a sleep study done in 2015, and was started on CPAP by [facility] . He states that the pressure was too high on his machine, so he returned the machine.He does not remember which mask he used. He states he has gained > 10 pounds in the last 2 years. He goes to bed about 11 pm, but rarely sleeps more than 4 hours. He does have daytime sleepiness.He states he does not get sleepy while driving. He is a former smoker.He states he does not have headache during the day.Family history of OSA ( Cousin) he rarely drinks alcohol. Home Sleep Study was done 02/24/2016. AHI was 40.3/hr. Lowest saturation was 82%. Diagnosis: Severe OSA with hypopneas causing severe oxygen desaturations.  06/03/2016 Follow Up OV: Pt. Presents to the office today for follow up and down load of CPAP data. He is using auto CPAP with range of 5-15 cm of water. He is compliant every night with his CPAP. He wore his CPAP 30 of 30 days. Average usage was 6 hours and 32 minutes. Median pressure was 10.3 cm of water. He continues to have AHI of 15.7. Central apnea index was 1.7, obstructive 4.0, unknown 3.8. The patient states that his mask leaks terribly. Upon reviewing the download data, he has very significant leak.( Patient states when he was fitted for the mask, the young lady didn't take appropriate measurements). He feels his mask is to big. He states that it leaks around his eyes at night, which keeps him awake.. Medium leaks are 20.1 L/m. Small was 74.2. Otherwise doing well. Denies chest pain, fever, orthopnea, or hemoptysis. He is compliant with therapy.  Tests Download ranging 05/04/2016 through 06/02/2016: Data reviewed AutoSet 5 cm of water to 15 cm of water Usage days 30 of 30  Days greater  than 4 hours 28  Days less than 4 hours 2 Average usage 6 hours and 32 minutes AHI equals 15.7 Pressure medium equals 10.3 Leaks medium equals 20.1 L/m  Past medical hx Past Medical History:  Diagnosis Date  . Carotid bruit 05/28/10   Tortuosity of carotids on doppler u/s, no stenosis.  . Diabetes mellitus    type 2- non insulin-requiring  . Dyspnea    Cardiology w/u unrevealing except LVOT obstruction: suspect dyspnea due to morbid obesity and OSA  . Fatty liver u/s and CT 2009   w/mildly elevated transaminases; u/s 12/2012 reconfirmed dx.  Hep B and C testing negative.  Marland Kitchen GERD (gastroesophageal reflux disease)    EDG 04-03-01, + distal esophageal stricture dilation  . Gout   . Hiatal hernia   . Hyperlipidemia   . Hypertension    severe LVH on echo  . Moderate persistent asthma 04/19/2014  . Morbid obesity ([place])   . OSA (obstructive sleep apnea) 04/19/10 sleep study   Could not tolerate CPAP, not surgical candidate per Dr. Benjamine Mola.  Restarting CPAP (auto titrate 5-15 with full facemask--Dr. Elsworth Soho).  . Osteoarthritis, multiple sites   . Stricture and stenosis of esophagus   . Subvalvular aortic stenosis 2012   LVOT obstruction stable on echo 01/09/12 and again on f/u echo 09/29/14--with evidence of HOCM.  Marland Kitchen Venous insufficiency of both lower extremities      Past surgical  hx, Family hx, Social hx all reviewed.  Current Outpatient Prescriptions on File Prior to Visit  Medication Sig  . albuterol (VENTOLIN HFA) 108 (90 Base) MCG/ACT inhaler Inhale 2 puffs into the lungs every 4 (four) hours as needed for wheezing.  Marland Kitchen allopurinol (ZYLOPRIM) 300 MG tablet Take 1 tablet by mouth  every day for gout  . aspirin 325 MG tablet Take 325 mg by mouth daily.    . Cholecalciferol (VITAMIN D) 2000 UNITS tablet Take 2,000 Units by mouth daily.  Marland Kitchen eplerenone (INSPRA) 50 MG tablet Take 50 mg by mouth daily. Started 09/28/14 by Dr. Einar Gip  . fluticasone (CUTIVATE) 0.05 % cream Apply topically 2 (two)  times daily.  . fluticasone (FLONASE) 50 MCG/ACT nasal spray Place 2 sprays into both nostrils daily.  Marland Kitchen glipiZIDE (GLUCOTROL XL) 5 MG 24 hr tablet TAKE 1 TABLET BY MOUTH  DAILY  . glucose blood (ONE TOUCH ULTRA TEST) test strip Use as instructed to check blood sugar.  DX 250.00  . metFORMIN (GLUCOPHAGE) 1000 MG tablet TAKE 1 TABLET BY MOUTH TWO  TIMES DAILY WITH MEALS  . metoprolol (LOPRESSOR) 50 MG tablet Take 1 tablet (50 mg total) by mouth 2 (two) times daily. As per Dr. Einar Gip  . mometasone-formoterol (DULERA) 200-5 MCG/ACT AERO Inhale 2 puffs into the lungs 2 (two) times daily.  . ONE TOUCH LANCETS MISC Use as directed to check blood sugar.  DX 250.00  . pantoprazole (PROTONIX) 40 MG tablet Take 1 tablet by mouth  daily  . pravastatin (PRAVACHOL) 80 MG tablet Take 1 tablet by mouth  daily  . verapamil (VERELAN PM) 120 MG 24 hr capsule Take 1 capsule by mouth  daily .   No current facility-administered medications on file prior to visit.      No Known Allergies  Review Of Systems:  Constitutional:   No  weight loss, night sweats,  Fevers, chills, fatigue, or  lassitude.  HEENT:   No headaches,  Difficulty swallowing,  Tooth/dental problems, or  Sore throat,                No sneezing, itching, ear ache, nasal congestion, post nasal drip,   CV:  No chest pain,  Orthopnea, PND, swelling in lower extremities, anasarca, dizziness, palpitations, syncope.   GI  No heartburn, indigestion, abdominal pain, nausea, vomiting, diarrhea, change in bowel habits, loss of appetite, bloody stools.   Resp: No shortness of breath with exertion or at rest.  No excess mucus, no productive cough,  No non-productive cough,  No coughing up of blood.  No change in color of mucus.  No wheezing.  No chest wall deformity  Skin: no rash or lesions.  GU: no dysuria, change in color of urine, no urgency or frequency.  No flank pain, no hematuria   MS:  No joint pain or swelling.  No decreased range of motion.   No back pain.  Psych:  No change in mood or affect. No depression or anxiety.  No memory loss.  Patient's only complaint is of mask leakage Vital Signs BP (!) 142/70 (BP Location: Left Arm, Cuff Size: Normal)   Pulse 79   Ht 5\' 9"  (1.753 m)   Wt 270 lb (122.5 kg)   SpO2 95%   BMI 39.87 kg/m    Physical Exam:  General- No distress,  A&Ox3, pleasant ENT: No sinus tenderness, TM clear, pale nasal mucosa, no oral exudate,no post nasal drip, no LAN Cardiac: S1, S2, regular rate and  rhythm, no murmur Chest: No wheeze/ rales/ dullness; no accessory muscle use, no nasal flaring, no sternal retractions Abd.: Soft Non-tender Ext: No clubbing cyanosis, edema Neuro:  normal strength Skin: No rashes, warm and dry Psych: normal mood and behavior   Assessment/Plan  Obstructive sleep apnea We will request an appointment for a mask re-fit at [facility]  We will need to get mask fit better, and then evaluate AHI and determine need to adjust pressures. Continue on CPAP at bedtime. You appear to be benefiting from the treatment Goal is to wear for at least 4-6 hours each night for maximal clinical benefit. Continue to work on weight loss, as the link between excess weight  and sleep apnea is well established.  Do not drive if sleepy. Follow up with Dr. Elsworth Soho or Eric Form, NP  in 2 months with Down Load Please contact office for sooner follow up if symptoms do not improve or worsen or seek emergency care      Magdalen Spatz, NP 06/03/2016  2:09 PM

## 2016-06-18 DIAGNOSIS — G4733 Obstructive sleep apnea (adult) (pediatric): Secondary | ICD-10-CM | POA: Diagnosis not present

## 2016-06-29 ENCOUNTER — Other Ambulatory Visit: Payer: Self-pay | Admitting: Family Medicine

## 2016-07-02 DIAGNOSIS — G4733 Obstructive sleep apnea (adult) (pediatric): Secondary | ICD-10-CM | POA: Diagnosis not present

## 2016-07-16 DIAGNOSIS — G4733 Obstructive sleep apnea (adult) (pediatric): Secondary | ICD-10-CM | POA: Diagnosis not present

## 2016-07-19 ENCOUNTER — Ambulatory Visit (INDEPENDENT_AMBULATORY_CARE_PROVIDER_SITE_OTHER): Payer: Medicare Other | Admitting: Family Medicine

## 2016-07-19 ENCOUNTER — Encounter: Payer: Self-pay | Admitting: Family Medicine

## 2016-07-19 VITALS — BP 122/65 | HR 58 | Temp 98.0°F | Resp 16 | Ht 69.0 in | Wt 275.5 lb

## 2016-07-19 DIAGNOSIS — J4541 Moderate persistent asthma with (acute) exacerbation: Secondary | ICD-10-CM | POA: Diagnosis not present

## 2016-07-19 DIAGNOSIS — J069 Acute upper respiratory infection, unspecified: Secondary | ICD-10-CM

## 2016-07-19 MED ORDER — AZITHROMYCIN 250 MG PO TABS: ORAL_TABLET | ORAL | 0 refills | Status: DC

## 2016-07-19 MED ORDER — METHYLPREDNISOLONE ACETATE 80 MG/ML IJ SUSP
80.0000 mg | Freq: Once | INTRAMUSCULAR | Status: AC
  Administered 2016-07-19: 80 mg via INTRAMUSCULAR

## 2016-07-19 MED ORDER — PREDNISONE 20 MG PO TABS: ORAL_TABLET | ORAL | 0 refills | Status: DC

## 2016-07-19 NOTE — Progress Notes (Signed)
Pre visit review using our clinic review tool, if applicable. No additional management support is needed unless otherwise documented below in the visit note. 

## 2016-07-19 NOTE — Progress Notes (Signed)
OFFICE VISIT  07/19/2016   CC:  Chief Complaint  Patient presents with  . URI    x 3 weeks, runny nose, chest and nasal congestion, productive cough and post nasal drainage   HPI:    Patient is a 71 y.o. Caucasian male who presents accompanied by his wife for respiratory symptoms. He carries a dx of moderate persistent asthma and is rx'd dulera 200/5, 2 p bid, as well as ProAir to take q6h prn.  Onset about 3 wks ago: nasal congestion/runny nose, PND, cough.  Some wheezing intermittently.  He used the [employer] and this helped some.  No SOB. No fever.  Tried dayquil, nyquil, tylenol sinus, mucinex. Sx's not improving any.  He specifically asks for "a shot in the but to knock this out doc".    Past Medical History:  Diagnosis Date  . Carotid bruit 05/28/10   Tortuosity of carotids on doppler u/s, no stenosis.  . Diabetes mellitus    type 2- non insulin-requiring  . Dyspnea    Cardiology w/u unrevealing except LVOT obstruction: suspect dyspnea due to morbid obesity and OSA  . Fatty liver u/s and CT 2009   w/mildly elevated transaminases; u/s 12/2012 reconfirmed dx.  Hep B and C testing negative.  Marland Kitchen GERD (gastroesophageal reflux disease)    EDG 04-03-01, + distal esophageal stricture dilation  . Gout   . Hiatal hernia   . Hyperlipidemia   . Hypertension    severe LVH on echo  . Moderate persistent asthma 04/19/2014  . Morbid obesity ([place])   . OSA on CPAP    Could not tolerate CPAP, not surgical candidate per Dr. Benjamine Mola.  Restarted CPAP (auto titrate 5-15 with full facemask--Dr. Alva 2017.  Getting mask fitting/leak issues worked out as of 06/03/16 pulm f/u.  . Osteoarthritis, multiple sites   . Stricture and stenosis of esophagus   . Subvalvular aortic stenosis 2012   LVOT obstruction stable on echo 01/09/12 and again on f/u echo 09/29/14--with evidence of HOCM.  Marland Kitchen Venous insufficiency of both lower extremities     Past Surgical History:  Procedure Laterality Date  . CARDIOVASCULAR  STRESS TEST  2006 and 04/27/10   Normal stress nuclear study, normal EF  . COLONOSCOPY  x 2   Dr. Sharlett Iles w/ [facility]: latest 04/2006  . NASAL SINUS SURGERY    . [place]   ? Discectomy  . TEE WITHOUT CARDIOVERSION  05/2010   Dr. Einar Gip; severe LVH, no valvular abnormalities  . TRANSESOPHAGEAL ECHOCARDIOGRAM  06/06/10   LVOT obst with 135 mm Hg PG.  Not HOCM.   No mitral regurg..  Repeat echo 01/09/12 no change.  . TRANSTHORACIC ECHOCARDIOGRAM  05/28/10   Normal LVEF, LVOT obstruction, lateral wall hypokinesis    Outpatient Medications Prior to Visit  Medication Sig Dispense Refill  . allopurinol (ZYLOPRIM) 300 MG tablet Take 1 tablet by mouth  every day for gout 90 tablet 3  . aspirin 325 MG tablet Take 325 mg by mouth daily.      . Cholecalciferol (VITAMIN D) 2000 UNITS tablet Take 2,000 Units by mouth daily.    Marland Kitchen eplerenone (INSPRA) 50 MG tablet Take 50 mg by mouth daily. Started 09/28/14 by Dr. Einar Gip    . fluticasone (CUTIVATE) 0.05 % cream Apply topically 2 (two) times daily. 30 g 1  . fluticasone (FLONASE) 50 MCG/ACT nasal spray Place 2 sprays into both nostrils daily. 16 g 6  . glipiZIDE (GLUCOTROL XL) 5 MG 24  hr tablet TAKE 1 TABLET BY MOUTH  DAILY 90 tablet 1  . glucose blood (ONE TOUCH ULTRA TEST) test strip Use as instructed to check blood sugar.  DX 250.00 100 each prn  . metFORMIN (GLUCOPHAGE) 1000 MG tablet TAKE 1 TABLET BY MOUTH TWO  TIMES DAILY WITH MEALS 180 tablet 1  . metoprolol (LOPRESSOR) 50 MG tablet Take 1 tablet (50 mg total) by mouth 2 (two) times daily. As per Dr. Einar Gip 180 tablet 4  . mometasone-formoterol (DULERA) 200-5 MCG/ACT AERO Inhale 2 puffs into the lungs 2 (two) times daily. 1 Inhaler 11  . ONE TOUCH LANCETS MISC Use as directed to check blood sugar.  DX 250.00 200 each prn  . pantoprazole (PROTONIX) 40 MG tablet Take 1 tablet by mouth  daily 90 tablet 3  . pravastatin (PRAVACHOL) 80 MG tablet Take 1 tablet by mouth  daily 90 tablet 3  . PROAIR  HFA 108 (90 Base) MCG/ACT inhaler INHALE 2 PUFFS INTO THE LUNGS EVERY 4 HOURS AS NEEDED FOR WHEEZING 8.5 g 0  . verapamil (VERELAN PM) 120 MG 24 hr capsule Take 1 capsule by mouth  daily . 90 capsule 3   No facility-administered medications prior to visit.     No Known Allergies  ROS As per HPI  PE: Blood pressure 122/65, pulse (!) 58, temperature 98 F (36.7 C), temperature source Oral, resp. rate 16, height 5\' 9"  (1.753 m), weight 275 lb 8 oz (125 kg), SpO2 95 %. VS: noted--normal. Gen: alert, NAD, NONTOXIC APPEARING. HEENT: eyes without injection, drainage, or swelling.  Ears: EACs clear, TMs with normal light reflex and landmarks.  Nose: Clear rhinorrhea, with some dried, crusty exudate adherent to mildly injected mucosa.  No purulent d/c.  No paranasal sinus TTP.  No facial swelling.  Throat and mouth without focal lesion.  No pharyngial swelling, erythema, or exudate.   Neck: supple, no LAD.   LUNGS: CTA bilat on inspiration.  He has trace insp/exp wheezing diffusely on exhalation but aeration is good.  Exp phase is not prolonged  Nonlabored resps.   CV: RRR, 8-1/8 systolic murmur--consistent with his known murmur.  No r/g. EXT: no c/c/e SKIN: no rash   LABS:    Chemistry      Component Value Date/Time   NA 141 08/15/2015 0854   K 4.3 08/15/2015 0854   CL 109 08/15/2015 0854   CO2 27 08/15/2015 0854   BUN 15 08/15/2015 0854   CREATININE 0.91 08/15/2015 0854      Component Value Date/Time   CALCIUM 9.4 08/15/2015 0854   ALKPHOS 51 08/15/2015 0854   AST 23 08/15/2015 0854   ALT 27 08/15/2015 0854   BILITOT 0.6 08/15/2015 0854       IMPRESSION AND PLAN:  1) Prolonged URI--possibly bacterial, with mild acute flare of asthma. Depo medrol 80mg  IM given in office today. Prednisone to start tomorrow: 40mg  qd x 3d, then 20mg  qd x 3d, then 10mg  qd x 2d. Z-pack eRx'd. He will continue symptomatic care as he is currently doing.  An After Visit Summary was printed and  given to the patient.  FOLLOW UP: Return if symptoms worsen or fail to improve.  Signed:  Crissie Sickles, MD           07/19/2016

## 2016-07-19 NOTE — Addendum Note (Signed)
Addended by: Onalee Hua on: 07/19/2016 02:34 PM   Modules accepted: Orders

## 2016-07-23 ENCOUNTER — Telehealth: Payer: Self-pay | Admitting: Family Medicine

## 2016-07-23 ENCOUNTER — Other Ambulatory Visit: Payer: Self-pay | Admitting: Family Medicine

## 2016-07-23 MED ORDER — PREDNISONE 20 MG PO TABS: ORAL_TABLET | ORAL | 0 refills | Status: DC

## 2016-07-23 MED ORDER — DOXYCYCLINE HYCLATE 100 MG PO TABS: 100.0000 mg | ORAL_TABLET | Freq: Two times a day (BID) | ORAL | 0 refills | Status: DC

## 2016-07-23 NOTE — Telephone Encounter (Signed)
Please advise. Thanks.  

## 2016-07-23 NOTE — Telephone Encounter (Signed)
Can't take more z-pack at this time. I'll eRx doxycycline (antibiotic) and I'll send in RF of the steroids I rx'd him.-thx

## 2016-07-23 NOTE — Telephone Encounter (Signed)
I sent in rx for doxycycline.  He cannot have back-to-back courses of Z-pack.--thx

## 2016-07-23 NOTE — Telephone Encounter (Signed)
Patients wife called and states that patient is out of his zpak (filled on 07/19/16)and needs a refill because he is still congested. She also thinks he will need another refill of prednisone. Please advise.

## 2016-07-24 NOTE — Telephone Encounter (Signed)
Left message for pt to call back  °

## 2016-07-24 NOTE — Telephone Encounter (Signed)
Pt advised and voiced understanding.   

## 2016-07-28 HISTORY — PX: OTHER SURGICAL HISTORY: SHX169

## 2016-08-05 ENCOUNTER — Ambulatory Visit (INDEPENDENT_AMBULATORY_CARE_PROVIDER_SITE_OTHER): Payer: Medicare Other | Admitting: Acute Care

## 2016-08-05 ENCOUNTER — Encounter: Payer: Self-pay | Admitting: Acute Care

## 2016-08-05 VITALS — BP 128/66 | HR 83 | Ht 69.0 in | Wt 272.0 lb

## 2016-08-05 DIAGNOSIS — G4733 Obstructive sleep apnea (adult) (pediatric): Secondary | ICD-10-CM | POA: Diagnosis not present

## 2016-08-05 NOTE — Assessment & Plan Note (Signed)
Continued improvement in AHI, but remains at 14.1 Significant leaks despite new mask. Plan Great compliance with CPAP. Keep it up. We will schedule you with Lynnae Sandhoff at the sleep lab to work on better fit of your mask.( You may need nasal prongs) Please take a copy of the down Load with you to the appointment. Continue on CPAP at bedtime.  You appear to be benefiting from the treatment Goal is to wear for at least 4-6 hours each night for maximal clinical benefit. Clean tubing, face mask and reservoir with soap and water weekly. Continue to work on weight loss, as the link between excess weight  and sleep apnea is well established.  Do not drive if sleepy. Follow up with Dr. Corrie Dandy  In June ( 2 month follow up)  or before as needed.  Please contact office for sooner follow up if symptoms do not improve or worsen or seek emergency care

## 2016-08-05 NOTE — Progress Notes (Signed)
History of Present Illness Jack [name] is a 71 y.o. male with OSA on CPAP therapy. He is followed by Dr. Corrie Dandy.  Synopsis: Pt. Was seen for consultation of OSA per request  Dr. Tamsen Snider. He states that he had a a sleep study done in 2015, and was started on CPAP by [facility] . He states that the pressure was too high on his machine, so he returned the machine.He does not remember which mask he used. He states he has gained > 10 pounds in the last 2 years. He goes to bed about 11 pm, but rarely sleeps more than 4 hours. He does have daytime sleepiness.He states he does not get sleepy while driving. He is a former smoker.He states he does not have headache during the day.Family history of OSA ( Cousin) he rarely drinks alcohol. Home Sleep Study was done 02/24/2016. AHI was 40.3/hr. Lowest saturation was 82%. Diagnosis: Severe OSA with hypopneas causing severe oxygen desaturations.   08/05/2016 2 month CPAP follow up. Pt. Presents for 2 month follow up of CPAP use. He has had a mask re-fitting since the last visit. AHI 2 months ago was 15.7. We had decided to have the mask adjusted and look at download for better control of AHI as there was significant leaks noted.Hollace Kinnier today reveals he still is having AHI of 14.1. He states he is sleeping better and feels well rested. He feels his quality of sleep is better.We discussed the continued AHI of 14.1. He states that he knows this is as a result of mask fit, and leak. He did get a new mask that he states he cannot tolerate it being tight due to the fact he broke his nose in the past. There is significant leak on down Load. We will send to [facility] in the sleep lab  for evaluation of mask fit and also consideration of nasal prongs if pt is unable to toloerate mask. His compliance is good,  he is wearing his mask for > 4 hours daily. He states he is a restless sleeper.He denies chest pain, fever, orthopnea or hemoptysis.  Tests Download  07/03/2016-08/01/2016 29/30 days ( 97%) Average use= 5 hours 18 minutes Auto set 5-15 cm H2O Average pressure 8.5 AHI 14.1 Leaks Median 30.8    Past medical hx Past Medical History:  Diagnosis Date  . Carotid bruit 05/28/10   Tortuosity of carotids on doppler u/s, no stenosis.  . Diabetes mellitus    type 2- non insulin-requiring  . Dyspnea    Cardiology w/u unrevealing except LVOT obstruction: suspect dyspnea due to morbid obesity and OSA  . Fatty liver u/s and CT 2009   w/mildly elevated transaminases; u/s 12/2012 reconfirmed dx.  Hep B and C testing negative.  Marland Kitchen GERD (gastroesophageal reflux disease)    EDG 04-03-01, + distal esophageal stricture dilation  . Gout   . Hiatal hernia   . Hyperlipidemia   . Hypertension    severe LVH on echo  . Moderate persistent asthma 04/19/2014  . Morbid obesity ([place])   . OSA on CPAP    Could not tolerate CPAP, not surgical candidate per Dr. Benjamine Mola.  Restarted CPAP (auto titrate 5-15 with full facemask--Dr. Alva 2017.  Getting mask fitting/leak issues worked out as of 06/03/16 pulm f/u.  . Osteoarthritis, multiple sites   . Stricture and stenosis of esophagus   . Subvalvular aortic stenosis 2012   LVOT obstruction stable on echo 01/09/12 and again on f/u echo 09/29/14--with  evidence of HOCM.  Marland Kitchen Venous insufficiency of both lower extremities      Past surgical hx, Family hx, Social hx all reviewed.  Current Outpatient Prescriptions on File Prior to Visit  Medication Sig  . allopurinol (ZYLOPRIM) 300 MG tablet Take 1 tablet by mouth  every day for gout  . aspirin 325 MG tablet Take 325 mg by mouth daily.    . Cholecalciferol (VITAMIN D) 2000 UNITS tablet Take 2,000 Units by mouth daily.  Marland Kitchen doxycycline (VIBRA-TABS) 100 MG tablet Take 1 tablet (100 mg total) by mouth 2 (two) times daily.  Marland Kitchen eplerenone (INSPRA) 50 MG tablet Take 50 mg by mouth daily. Started 09/28/14 by Dr. Einar Gip  . fluticasone (CUTIVATE) 0.05 % cream Apply topically 2 (two) times  daily.  . fluticasone (FLONASE) 50 MCG/ACT nasal spray Place 2 sprays into both nostrils daily.  Marland Kitchen glipiZIDE (GLUCOTROL XL) 5 MG 24 hr tablet TAKE 1 TABLET BY MOUTH  DAILY  . glucose blood (ONE TOUCH ULTRA TEST) test strip Use as instructed to check blood sugar.  DX 250.00  . metFORMIN (GLUCOPHAGE) 1000 MG tablet TAKE 1 TABLET BY MOUTH TWO  TIMES DAILY WITH MEALS  . metoprolol (LOPRESSOR) 50 MG tablet Take 1 tablet (50 mg total) by mouth 2 (two) times daily. As per Dr. Einar Gip  . mometasone-formoterol (DULERA) 200-5 MCG/ACT AERO Inhale 2 puffs into the lungs 2 (two) times daily.  . ONE TOUCH LANCETS MISC Use as directed to check blood sugar.  DX 250.00  . pantoprazole (PROTONIX) 40 MG tablet Take 1 tablet by mouth  daily  . pravastatin (PRAVACHOL) 80 MG tablet Take 1 tablet by mouth  daily  . predniSONE (DELTASONE) 20 MG tablet 2 tabs po qd x 3d, then 1 tab po qd x 3d, then 1/2 tab po qd x 2d, then stop.  Marland Kitchen PROAIR HFA 108 (90 Base) MCG/ACT inhaler INHALE 2 PUFFS INTO THE LUNGS EVERY 4 HOURS AS NEEDED FOR WHEEZING  . verapamil (VERELAN PM) 120 MG 24 hr capsule Take 1 capsule by mouth  daily .   No current facility-administered medications on file prior to visit.      No Known Allergies  Review Of Systems:  Constitutional:   No  weight loss, night sweats,  Fevers, chills, fatigue, or  lassitude.  HEENT:   No headaches,  Difficulty swallowing,  Tooth/dental problems, or  Sore throat,                No sneezing, itching, ear ache, nasal congestion, post nasal drip,   CV:  No chest pain,  Orthopnea, PND, swelling in lower extremities, anasarca, dizziness, palpitations, syncope.   GI  No heartburn, indigestion, abdominal pain, nausea, vomiting, diarrhea, change in bowel habits, loss of appetite, bloody stools.   Resp: No shortness of breath with exertion or at rest.  No excess mucus, no productive cough,  No non-productive cough,  No coughing up of blood.  No change in color of mucus.  No  wheezing.  No chest wall deformity  Skin: no rash or lesions.  GU: no dysuria, change in color of urine, no urgency or frequency.  No flank pain, no hematuria   MS:  No joint pain or swelling.  No decreased range of motion.  No back pain.  Psych:  No change in mood or affect. No depression or anxiety.  No memory loss.   Vital Signs BP 128/66 (BP Location: Right Arm, Cuff Size: Normal)   Pulse 83  Ht 5\' 9"  (1.753 m)   Wt 272 lb (123.4 kg)   SpO2 97%   BMI 40.17 kg/m    Physical Exam:  General- No distress,  A&Ox3, pleasant ENT: No sinus tenderness, TM clear, pale nasal mucosa, no oral exudate,no post nasal drip, no LAN Cardiac: S1, S2, regular rate and rhythm, no murmur Chest: No wheeze/ rales/ dullness; no accessory muscle use, no nasal flaring, no sternal retractions Abd.: Soft Non-tender, obese Ext: No clubbing cyanosis, edema Neuro:  normal strength Skin: No rashes, warm and dry Psych: normal mood and behavior, but frustrated there is not better AHI control.   Assessment/Plan  Obstructive sleep apnea Continued improvement in AHI, but remains at 14.1 Significant leaks despite new mask. Plan Great compliance with CPAP. Keep it up. We will schedule you with Lynnae Sandhoff at the sleep lab to work on better fit of your mask.( You may need nasal prongs) Please take a copy of the down Load with you to the appointment. Continue on CPAP at bedtime.  You appear to be benefiting from the treatment Goal is to wear for at least 4-6 hours each night for maximal clinical benefit. Clean tubing, face mask and reservoir with soap and water weekly. Continue to work on weight loss, as the link between excess weight  and sleep apnea is well established.  Do not drive if sleepy. Follow up with Dr. Corrie Dandy  In June ( 2 month follow up)  or before as needed.  Please contact office for sooner follow up if symptoms do not improve or worsen or seek emergency care      Magdalen Spatz,  NP 08/05/2016  1:35 PM

## 2016-08-05 NOTE — Patient Instructions (Addendum)
It is good to see you today. Great compliance with CPAP. Keep it up. We will schedule you with Lynnae Sandhoff at the sleep lab to work on better fit of your mask.( You may need nasal prongs) Please take a copy of the down Load with you to the appointment. Continue on CPAP at bedtime.  You appear to be benefiting from the treatment Goal is to wear for at least 4-6 hours each night for maximal clinical benefit. Clean tubing, face mask and reservoir with soap and water weekly. Continue to work on weight loss, as the link between excess weight  and sleep apnea is well established.  Do not drive if sleepy. Follow up with Dr. Corrie Dandy  In June ( 2 month follow up)  or before as needed.  Please contact office for sooner follow up if symptoms do not improve or worsen or seek emergency care

## 2016-08-07 ENCOUNTER — Telehealth: Payer: Self-pay | Admitting: Pulmonary Disease

## 2016-08-07 ENCOUNTER — Encounter: Payer: Self-pay | Admitting: Family Medicine

## 2016-08-07 DIAGNOSIS — G4733 Obstructive sleep apnea (adult) (pediatric): Secondary | ICD-10-CM

## 2016-08-07 NOTE — Telephone Encounter (Signed)
AD  He is scheduled 4/23 and the download is in your look at

## 2016-08-07 NOTE — Telephone Encounter (Signed)
   Jasmine:  Eric Form saw this pt recently.  She looked at a cpap download.  Can I have a copy of this pt's DL? Any recent DL for that matter.   Pt is scheduled to see vernon for a mask fit.  When is this scheduled for?  Thanks.  Monica Becton, MD 08/07/2016, 5:24 AM [facility] Pager [phone] After 3 pm or if no answer, call [phone]

## 2016-08-08 NOTE — Telephone Encounter (Signed)
   DL the last month on autocpap 5-15 cm water.  83%. AHI 14. Has lots of leak.   Jasmine >> can we make him a PAP NAP visit as well as mask fit visit  with Lynnae Sandhoff instead on 4/23?  I need to determine what a good pressure will be.  Let me know what Lynnae Sandhoff says.  Thanks!   J. Shirl Harris, MD 08/08/2016, 8:03 PM [facility] [facility] Pager [phone] After 3 pm or if no answer, call [phone]

## 2016-08-09 NOTE — Telephone Encounter (Signed)
lmomtcb x1 

## 2016-08-12 NOTE — Telephone Encounter (Signed)
Pt wife returning call.Stanley A Dalton' °

## 2016-08-12 NOTE — Telephone Encounter (Signed)
Spoke with patient wife, aware of results. Okay with [place] trying to schedule the PAP NAP in additional to his mask fit on 08/19/16. Pt wife aware to have the patient contact our office if unable to make appts scheduled. Order placed for nap test at sleep center. Nothing further needed.

## 2016-08-16 DIAGNOSIS — G4733 Obstructive sleep apnea (adult) (pediatric): Secondary | ICD-10-CM | POA: Diagnosis not present

## 2016-08-17 ENCOUNTER — Other Ambulatory Visit: Payer: Self-pay | Admitting: Family Medicine

## 2016-08-19 ENCOUNTER — Ambulatory Visit (HOSPITAL_BASED_OUTPATIENT_CLINIC_OR_DEPARTMENT_OTHER): Payer: Medicare Other | Attending: Acute Care | Admitting: Radiology

## 2016-08-19 ENCOUNTER — Ambulatory Visit (HOSPITAL_BASED_OUTPATIENT_CLINIC_OR_DEPARTMENT_OTHER): Payer: Medicare Other | Admitting: Pulmonary Disease

## 2016-08-19 DIAGNOSIS — E669 Obesity, unspecified: Secondary | ICD-10-CM | POA: Diagnosis present

## 2016-08-19 DIAGNOSIS — G4733 Obstructive sleep apnea (adult) (pediatric): Secondary | ICD-10-CM | POA: Diagnosis not present

## 2016-08-19 NOTE — Telephone Encounter (Signed)
[facility].   RF request for pravastatin LOV: 08/15/15 Next ov: None Last written: 08/18/15 #90 w/ 3RF  RF request for verapamil Last written: 08/18/15 #90 w/ 3RF  Will send Rx for #90 w/ 0RF. Pt is due for f/u [facility], needs office visit for more refills.   Left message for pt to call back.

## 2016-08-20 NOTE — Telephone Encounter (Signed)
Patient's wife aware.  Patient's wife was pleasant, but wasn't able to schedule at this time, we also got cut off.

## 2016-08-26 ENCOUNTER — Telehealth: Payer: Self-pay | Admitting: Pulmonary Disease

## 2016-08-26 DIAGNOSIS — G4733 Obstructive sleep apnea (adult) (pediatric): Secondary | ICD-10-CM

## 2016-08-26 NOTE — Procedures (Signed)
    NAME: Jack [name] DATE OF BIRTH:  02-19-1946 MEDICAL RECORD NUMBER [id number]  LOCATION: [facility]  PHYSICIAN: Bear  DATE OF STUDY: 08/19/2016   CLINICAL INFORMATION  The patient was referred to the sleep center for evaluation of best mask fit and best cpap pressure. Indications include OSA and mask issues.   MEDICATIONS  Patient self administered medications include: N/A. Medications administered during study include No sleep medicine administered. Meds reviewed per chart review done.   SLEEP STUDY TECHNIQUE  The patient underwent an attended 80 minute polysomnography titration to assess the best mask fit as well as the best cpap pressure.   TECHNICIAN COMMENTS  Comments added by Technician: HX OF OSA. Patient with elevated AHI on autocpap 5-15 cm water as well as persistent mask leak issues. Comments added by Scorer: N/A   SLEEP ARCHITECTURE  The study was initiated at 11:10:23 AM and terminated at 12:31:06 PM. The total recorded time was 80.7 minutes. I discussed the case with the Sleep tech and he said patient had some sleep during the 80 minute "titration" study. Best pressure obtained was 10 cm water. They were also ale to obtain best mask fit.   RESPIRATORY PARAMETERS  I discussed case with the Sleep tech and he said best pressure was 10 cm water with the chosen mask. Patient had some sleep during the study.   LEG MOVEMENT DATA  The periodic limb movement index was N/A/hour with an associated arousal index of /hour.   CARDIAC DATA  The underlying cardiac rhythm was most consistent with sinus rhythm. Mean heart rate during sleep was N/A bpm. Additional rhythm abnormalities include None.   DIAGNOSIS          1.  Obstructive Sleep Apnea (327.23 [G47.33 ICD-10])  RECOMMENDATIONS  1. Trial of CPAP therapy on 10 cm H2O with a Large size Fisher&Paykel Full Face Mask Simplus mask and heated humidification. 2. Patient will need a 1  month download to determine cpap efficacy.   Monica Becton, MD 08/26/2016, 10:59 PM [place] [facility] Pager [phone] After 3 pm or if no answer, call [phone]

## 2016-08-26 NOTE — Telephone Encounter (Signed)
  Jasmine :   Pls tell pt the pap nap showed that he is best at cpap 10 cm water.  Pls set his autocpap to 10 cm H2O with a Large size Fisher&Paykel Full Face Mask Simplus mask and heated humidification.  Patient will need a 1 month download to determine cpap efficacy on his new mask as well as cpap 10 cm water.   Thanks.   Monica Becton, MD 08/26/2016, 11:02 PM  [facility] Pager [phone] After 3 pm or if no answer, call [phone]

## 2016-08-27 ENCOUNTER — Encounter: Payer: Self-pay | Admitting: Family Medicine

## 2016-08-27 NOTE — Telephone Encounter (Signed)
Spoke with pt about his results per AD. Pt understood and agreed to the order being placed. The order was placed. He had no further questions at this time

## 2016-08-27 NOTE — Addendum Note (Signed)
Addended by: Benson Setting L on: 08/27/2016 04:23 PM   Modules accepted: Orders

## 2016-09-15 DIAGNOSIS — G4733 Obstructive sleep apnea (adult) (pediatric): Secondary | ICD-10-CM | POA: Diagnosis not present

## 2016-10-01 ENCOUNTER — Telehealth: Payer: Self-pay | Admitting: Acute Care

## 2016-10-01 NOTE — Telephone Encounter (Signed)
Please order CPAP pressures  and mask as Dr. Corrie Dandy recommended. Please have patient follow up with download after 30 days of use. Thanks so much.    RECOMMENDATIONS  1. Trial of CPAP therapy on 10 cm H2O with a Large size Fisher&Paykel Full Face Mask Simplus mask and heated humidification. 2. Patient will need a 1 month download to determine cpap efficacy.

## 2016-10-01 NOTE — Telephone Encounter (Signed)
lmtcb x1 for pt. 

## 2016-10-11 ENCOUNTER — Telehealth: Payer: Self-pay | Admitting: Pulmonary Disease

## 2016-10-11 NOTE — Telephone Encounter (Signed)
Please order CPAP pressures  and mask as Dr. Corrie Dandy recommended. Please have patient follow up with download after 30 days of use. Thanks so much.    RECOMMENDATIONS 1. Trial of CPAP therapy on 10 cm H2O with a Large size Fisher&Paykel Full Face Mask Simplus mask and heated humidification.  2. Patient will need a 1 month download to determine cpap efficacy.   Spoke with patient's wife. She is aware of the recommendations. Per patient's chart, it appears the orders were already sent in on May 1st of this year.

## 2016-10-14 ENCOUNTER — Ambulatory Visit: Payer: Medicare Other | Admitting: Pulmonary Disease

## 2016-10-16 DIAGNOSIS — G4733 Obstructive sleep apnea (adult) (pediatric): Secondary | ICD-10-CM | POA: Diagnosis not present

## 2016-10-21 ENCOUNTER — Telehealth: Payer: Self-pay | Admitting: Pulmonary Disease

## 2016-10-21 DIAGNOSIS — G4733 Obstructive sleep apnea (adult) (pediatric): Secondary | ICD-10-CM

## 2016-10-21 NOTE — Telephone Encounter (Signed)
Spoke with the pt's spouse  She states pt told her that she he does not feel like his CPAP is giving him enough air  She wants [place] to see about increasing the pressure  I printed DL for you to review (in your lookat cubby) Please advise, thanks!

## 2016-10-21 NOTE — Telephone Encounter (Signed)
SG  Please Advise-  Spoke with pt's wife, when pt saw Lynnae Sandhoff he did a mask fit and had the new mask from him but he states that he did not like that mask and has went back to his old mask. He states he feels like the pressure if just not enough

## 2016-10-21 NOTE — Telephone Encounter (Signed)
Place in order to increase his pressures to 12 cm of water. The mask leak is an issue and will continue to be of an issue even with high pressures. He needs to recognize that is not wearing the recommended mask is contributing to the sensation of not enough pressure. He may need to shave as that is probably contributing to the poor mask seal.

## 2016-10-21 NOTE — Telephone Encounter (Signed)
Download shows significantly. We need to evaluate whether or not he's had a mask fitting recently because that would be the first change to make prior to changing any pressures. Please call patient and see if he has had issues with mask leak. If he has please schedule him for a mask fitting. We can then reevaluate. Thank you

## 2016-10-21 NOTE — Telephone Encounter (Signed)
Spoke with the pt and notified of recs per SG  He verbalized understanding  Order sent to [facility]

## 2016-10-23 DIAGNOSIS — G4733 Obstructive sleep apnea (adult) (pediatric): Secondary | ICD-10-CM | POA: Diagnosis not present

## 2016-11-08 ENCOUNTER — Other Ambulatory Visit: Payer: Self-pay | Admitting: Family Medicine

## 2016-11-08 NOTE — Telephone Encounter (Signed)
Request came from [facility]. I will send in Rx for 30 day supply, no refills. Pt is over due for f/u [facility]. Pt's wife was advised on 08/20/16 that pt needed office visit for more refills. No apt has been made.   LOV: 08/15/15 NOV: None  Pt needs office visit for more refills.

## 2016-11-08 NOTE — Telephone Encounter (Signed)
Left message for pt to call back  °

## 2016-11-15 DIAGNOSIS — G4733 Obstructive sleep apnea (adult) (pediatric): Secondary | ICD-10-CM | POA: Diagnosis not present

## 2016-11-18 ENCOUNTER — Encounter: Payer: Self-pay | Admitting: Family Medicine

## 2016-11-18 ENCOUNTER — Ambulatory Visit (INDEPENDENT_AMBULATORY_CARE_PROVIDER_SITE_OTHER): Payer: Medicare Other | Admitting: Family Medicine

## 2016-11-18 VITALS — BP 151/73 | HR 69 | Temp 97.9°F | Resp 16 | Ht 69.0 in | Wt 280.0 lb

## 2016-11-18 DIAGNOSIS — E119 Type 2 diabetes mellitus without complications: Secondary | ICD-10-CM | POA: Diagnosis not present

## 2016-11-18 DIAGNOSIS — M1000 Idiopathic gout, unspecified site: Secondary | ICD-10-CM | POA: Diagnosis not present

## 2016-11-18 DIAGNOSIS — I10 Essential (primary) hypertension: Secondary | ICD-10-CM

## 2016-11-18 DIAGNOSIS — E7800 Pure hypercholesterolemia, unspecified: Secondary | ICD-10-CM

## 2016-11-18 DIAGNOSIS — G4733 Obstructive sleep apnea (adult) (pediatric): Secondary | ICD-10-CM | POA: Diagnosis not present

## 2016-11-18 DIAGNOSIS — I421 Obstructive hypertrophic cardiomyopathy: Secondary | ICD-10-CM | POA: Diagnosis not present

## 2016-11-18 LAB — MICROALBUMIN / CREATININE URINE RATIO
CREATININE, U: 62.5 mg/dL
MICROALB/CREAT RATIO: 1.1 mg/g (ref 0.0–30.0)

## 2016-11-18 MED ORDER — ALLOPURINOL 300 MG PO TABS: ORAL_TABLET | ORAL | 3 refills | Status: DC

## 2016-11-18 NOTE — Progress Notes (Signed)
OFFICE VISIT  11/18/2016   CC:  Chief Complaint  Patient presents with  . Follow-up    RCI, pt is not fasting.    HPI:    Patient is a 71 y.o. Caucasian male who presents for presents for f/u DM2, HTN, HLD.  DM: no home monitoring, says "I can feel when it is abnormal"--says it has not felt abnormal. No burning, tingling, or numbness of feet.  HTN: no home bp monitoring.  HLD: tolerating statin, no side effects.  Right foot sometimes swelling and hurting and turning red more lately--points to R ankle, lateral aspect. Has been taking his allopurinol 300mg  qd as rx'd. Has seen podiatrist recently and fluid from foot was obtained and pt reports he was told it was gout.   ROS: no CP, no SOB, no palpitations, no dizziness, no cough.  Past Medical History:  Diagnosis Date  . Carotid bruit 05/28/10   Tortuosity of carotids on doppler u/s, no stenosis.  . Diabetes mellitus    type 2- non insulin-requiring  . Dyspnea    Cardiology w/u unrevealing except LVOT obstruction: suspect dyspnea due to morbid obesity and OSA  . Fatty liver u/s and CT 2009   w/mildly elevated transaminases; u/s 12/2012 reconfirmed dx.  Hep B and C testing negative.  Marland Kitchen GERD (gastroesophageal reflux disease)    EDG 04-03-01, + distal esophageal stricture dilation  . Gout   . Hiatal hernia   . Hyperlipidemia   . Hypertension    severe LVH on echo  . Moderate persistent asthma 04/19/2014  . Morbid obesity ([place])   . OSA on CPAP    Could not tolerate CPAP, not surgical candidate per Dr. Benjamine Mola.  Restarted CPAP (auto titrate 5-15 with full facemask--Dr. Alva 2017.  Getting mask fitting/leak issues worked out as of 07/2016 pulm f/u.  . Osteoarthritis, multiple sites   . Stricture and stenosis of esophagus   . Subvalvular aortic stenosis 2012   LVOT obstruction stable on echo 01/09/12 and again on f/u echo 09/29/14--with evidence of HOCM.  Marland Kitchen Venous insufficiency of both lower extremities     Past Surgical History:   Procedure Laterality Date  . CARDIOVASCULAR STRESS TEST  2006 and 04/27/10   Normal stress nuclear study, normal EF  . COLONOSCOPY  x 2   Dr. Sharlett Iles w/ [facility]: latest 04/2006  . NASAL SINUS SURGERY    . Sleep study  07/2016   OSA; CPAP trial at 10 cm H20 recommended by pulm (Dr. Tennis Must Dios--[facility]).  . [place]   ? Discectomy  . TEE WITHOUT CARDIOVERSION  05/2010   Dr. Einar Gip; severe LVH, no valvular abnormalities  . TRANSESOPHAGEAL ECHOCARDIOGRAM  06/06/10   LVOT obst with 135 mm Hg PG.  Not HOCM.   No mitral regurg..  Repeat echo 01/09/12 no change.  . TRANSTHORACIC ECHOCARDIOGRAM  05/28/10   Normal LVEF, LVOT obstruction, lateral wall hypokinesis    Outpatient Medications Prior to Visit  Medication Sig Dispense Refill  . aspirin 325 MG tablet Take 325 mg by mouth daily.      . Cholecalciferol (VITAMIN D) 2000 UNITS tablet Take 2,000 Units by mouth daily.    Marland Kitchen eplerenone (INSPRA) 50 MG tablet Take 50 mg by mouth daily. Started 09/28/14 by Dr. Einar Gip    . fluticasone (FLONASE) 50 MCG/ACT nasal spray Place 2 sprays into both nostrils daily. 16 g 6  . glipiZIDE (GLUCOTROL XL) 5 MG 24 hr tablet TAKE 1 TABLET BY MOUTH  DAILY 30 tablet 0  . glucose blood (ONE TOUCH ULTRA TEST) test strip Use as instructed to check blood sugar.  DX 250.00 100 each prn  . metFORMIN (GLUCOPHAGE) 1000 MG tablet TAKE 1 TABLET BY MOUTH TWO  TIMES DAILY WITH MEALS 30 tablet 0  . metoprolol (LOPRESSOR) 50 MG tablet Take 1 tablet (50 mg total) by mouth 2 (two) times daily. As per Dr. Einar Gip 180 tablet 4  . mometasone-formoterol (DULERA) 200-5 MCG/ACT AERO Inhale 2 puffs into the lungs 2 (two) times daily. 1 Inhaler 11  . ONE TOUCH LANCETS MISC Use as directed to check blood sugar.  DX 250.00 200 each prn  . pantoprazole (PROTONIX) 40 MG tablet Take 1 tablet by mouth  daily 90 tablet 3  . pravastatin (PRAVACHOL) 80 MG tablet TAKE 1 TABLET BY MOUTH  DAILY 30 tablet 0  . PROAIR HFA 108 (90 Base) MCG/ACT  inhaler INHALE 2 PUFFS INTO THE LUNGS EVERY 4 HOURS AS NEEDED FOR WHEEZING 8.5 g 0  . verapamil (VERELAN PM) 120 MG 24 hr capsule TAKE 1 CAPSULE BY MOUTH  DAILY . 90 capsule 0  . allopurinol (ZYLOPRIM) 300 MG tablet TAKE 1 TABLET BY MOUTH  EVERY DAY FOR GOUT 90 tablet 3  . doxycycline (VIBRA-TABS) 100 MG tablet Take 1 tablet (100 mg total) by mouth 2 (two) times daily. (Patient not taking: Reported on 11/18/2016) 20 tablet 0  . fluticasone (CUTIVATE) 0.05 % cream Apply topically 2 (two) times daily. (Patient not taking: Reported on 11/18/2016) 30 g 1  . predniSONE (DELTASONE) 20 MG tablet 2 tabs po qd x 3d, then 1 tab po qd x 3d, then 1/2 tab po qd x 2d, then stop. (Patient not taking: Reported on 11/18/2016) 10 tablet 0   No facility-administered medications prior to visit.     No Known Allergies  ROS As per HPI  PE: Blood pressure (!) 151/73, pulse 69, temperature 97.9 F (36.6 C), temperature source Oral, resp. rate 16, height 5\' 9"  (1.753 m), weight 280 lb (127 kg), SpO2 94 %. Body mass index is 41.35 kg/m.  Gen: Alert, well appearing.  Patient is oriented to person, place, time, and situation. AFFECT: pleasant, lucid thought and speech. CV: RRR, 3/6 systolic murmur, S1 and S2 clear, no rub or gallop. Chest is clear, no wheezing or rales. Normal symmetric air entry throughout both lung fields. No chest wall deformities or tenderness. EXT: 1+ pitting edema in both pretibial/ankle regions w/out rash. Right ankle with minimal swelling but no warmth, erythema, or tenderness. Foot exam - bilateral normal; no swelling, tenderness or skin or vascular lesions. Color and temperature is normal. Sensation is intact. Peripheral pulses are palpable. Toenails are normal.   LABS:  Lab Results  Component Value Date   TSH 3.08 04/19/2014   No results found for: WBC, HGB, HCT, MCV, PLT Lab Results  Component Value Date   CREATININE 0.91 08/15/2015   BUN 15 08/15/2015   NA 141 08/15/2015   K  4.3 08/15/2015   CL 109 08/15/2015   CO2 27 08/15/2015  GFR 08/15/15= 88 ml/min  Lab Results  Component Value Date   ALT 27 08/15/2015   AST 23 08/15/2015   ALKPHOS 51 08/15/2015   BILITOT 0.6 08/15/2015   Lab Results  Component Value Date   CHOL 140 08/15/2015   Lab Results  Component Value Date   HDL 41.10 08/15/2015   Lab Results  Component Value Date   LDLCALC 76 08/15/2015  Lab Results  Component Value Date   TRIG 117.0 08/15/2015   Lab Results  Component Value Date   CHOLHDL 3 08/15/2015   Lab Results  Component Value Date   PSA 0.70 11/28/2015   PSA 0.71 04/19/2014   PSA 0.73 06/08/2012   Lab Results  Component Value Date   HGBA1C 6.6 (H) 08/15/2015   IMPRESSION AND PLAN:  1) DM 2; no home monitoring.  Historically well controlled. HbA1c with fasting labs tomorrow. Feet exam normal today. Urine microalb/cr today. He is reminded of need for updated diab retpthy eye exam.  2) HTN: mild elevation here today. Asked him to monitor this at home 3-4 days a week and if consistently >130/80 he is to call my office or make return appt.  3) HLD: tolerating statin.  Return tomorrow when fasting to get FLP.  4) Gout: recurrent R ankle flares: increase allopurinol to 300 mg bid. Check uric acid with tomorrow's labs.  Goal uric acid <6.0.  An After Visit Summary was printed and given to the patient.  FOLLOW UP: Return in about 3 months (around 02/18/2017) for annual CPE (fasting):  also LAB VISIT AT PT'S EARLIEST CONVENIENCE FOR FASTING LABS.  Signed:  Crissie Sickles, MD           11/18/2016

## 2016-11-18 NOTE — Telephone Encounter (Signed)
Pt advised and voiced understanding.  Apt made for today at 11:00am.

## 2016-11-19 ENCOUNTER — Encounter: Payer: Self-pay | Admitting: Family Medicine

## 2016-11-20 ENCOUNTER — Other Ambulatory Visit (INDEPENDENT_AMBULATORY_CARE_PROVIDER_SITE_OTHER): Payer: Medicare Other

## 2016-11-20 DIAGNOSIS — E119 Type 2 diabetes mellitus without complications: Secondary | ICD-10-CM | POA: Diagnosis not present

## 2016-11-20 DIAGNOSIS — E7800 Pure hypercholesterolemia, unspecified: Secondary | ICD-10-CM | POA: Diagnosis not present

## 2016-11-20 DIAGNOSIS — M1000 Idiopathic gout, unspecified site: Secondary | ICD-10-CM | POA: Diagnosis not present

## 2016-11-20 DIAGNOSIS — I10 Essential (primary) hypertension: Secondary | ICD-10-CM | POA: Diagnosis not present

## 2016-11-20 LAB — COMPREHENSIVE METABOLIC PANEL
ALBUMIN: 4.1 g/dL (ref 3.5–5.2)
ALT: 34 U/L (ref 0–53)
AST: 32 U/L (ref 0–37)
Alkaline Phosphatase: 57 U/L (ref 39–117)
BUN: 14 mg/dL (ref 6–23)
CHLORIDE: 107 meq/L (ref 96–112)
CO2: 29 mEq/L (ref 19–32)
CREATININE: 1.1 mg/dL (ref 0.40–1.50)
Calcium: 9.4 mg/dL (ref 8.4–10.5)
GFR: 70.38 mL/min (ref >60.00)
Glucose, Bld: 125 mg/dL — ABNORMAL HIGH (ref 70–99)
Potassium: 4.3 mEq/L (ref 3.5–5.1)
SODIUM: 141 meq/L (ref 135–145)
Total Bilirubin: 0.6 mg/dL (ref 0.2–1.2)
Total Protein: 6.4 g/dL (ref 6.0–8.3)

## 2016-11-20 LAB — LIPID PANEL
CHOL/HDL RATIO: 5
Cholesterol: 174 mg/dL (ref 0–200)
HDL: 34.1 mg/dL — AB (ref >39.00)
LDL CALC: 111 mg/dL — AB (ref 0–99)
NonHDL: 140.3
TRIGLYCERIDES: 149 mg/dL (ref 0.0–149.0)
VLDL: 29.8 mg/dL (ref 0.0–40.0)

## 2016-11-20 LAB — URIC ACID: Uric Acid, Serum: 5.4 mg/dL (ref 4.0–7.8)

## 2016-11-20 LAB — HEMOGLOBIN A1C: Hgb A1c MFr Bld: 7.5 % — ABNORMAL HIGH (ref 4.6–6.5)

## 2016-11-24 ENCOUNTER — Encounter: Payer: Self-pay | Admitting: Pulmonary Disease

## 2016-11-26 ENCOUNTER — Encounter: Payer: Self-pay | Admitting: Pulmonary Disease

## 2016-11-26 ENCOUNTER — Ambulatory Visit (INDEPENDENT_AMBULATORY_CARE_PROVIDER_SITE_OTHER): Payer: Medicare Other | Admitting: Pulmonary Disease

## 2016-11-26 ENCOUNTER — Other Ambulatory Visit: Payer: Self-pay | Admitting: *Deleted

## 2016-11-26 ENCOUNTER — Other Ambulatory Visit: Payer: Self-pay | Admitting: Family Medicine

## 2016-11-26 ENCOUNTER — Telehealth: Payer: Self-pay | Admitting: *Deleted

## 2016-11-26 DIAGNOSIS — G4733 Obstructive sleep apnea (adult) (pediatric): Secondary | ICD-10-CM

## 2016-11-26 DIAGNOSIS — J4540 Moderate persistent asthma, uncomplicated: Secondary | ICD-10-CM

## 2016-11-26 MED ORDER — GLIPIZIDE ER 10 MG PO TB24: 10.0000 mg | ORAL_TABLET | Freq: Every day | ORAL | 1 refills | Status: DC

## 2016-11-26 MED ORDER — ROSUVASTATIN CALCIUM 20 MG PO TABS: 20.0000 mg | ORAL_TABLET | Freq: Every day | ORAL | 3 refills | Status: DC

## 2016-11-26 NOTE — Telephone Encounter (Signed)
Patient's wife called stating that a nurse called to let the patient know his lab results. She is wanting to know the results as well. Patient wife is requesting a call back on her cell. Her contact number is [phone]. Please advise. Thank you

## 2016-11-26 NOTE — Patient Instructions (Addendum)
Increase pressure to 14 cm and repeat download in one month

## 2016-11-26 NOTE — Telephone Encounter (Signed)
Pts wife advised of lab results, okay per DPR.

## 2016-11-26 NOTE — Assessment & Plan Note (Signed)
Increase pressure to 14 cm and repeat download in one month  Weight loss encouraged, compliance with goal of at least 4-6 hrs every night is the expectation. Advised against medications with sedative side effects Cautioned against driving when sleepy - understanding that sleepiness will vary on a day to day basis

## 2016-11-26 NOTE — Progress Notes (Signed)
   Subjective:    Patient ID: Jack [name], male    DOB: 1946-04-24, 71 y.o.   MRN: [id number]  HPI 71 year old obese man with moderate OSA  PSG in 2015 showed moderate OSA with AHI 25/hour which was corrected by CPAP of 13 cm. However he did not tolerate this high pressure., placed on auto 5-15 cm  Download showed persistent events and hence he was changed to set pressure of 12 cm  He is tolerating this better u& able to use it on an average 5.5 hours every night which is comfortable and download. He still has residual AHI of 14/hour with moderately Wife has not noted snoring and observed him to have increased energy  Asthma appears to be well controlled, he denies nocturnal wheezing  Significant tests/ events reviewed  PSG i2015  moderate OSA with AHI 25/hour which was corrected by CPAP of 13 cm. HST 01/2016 severe AHI 40/h  Review of Systems    neg for any significant sore throat, dysphagia, itching, sneezing, nasal congestion or excess/ purulent secretions, fever, chills, sweats, unintended wt loss, pleuritic or exertional cp, hempoptysis, orthopnea pnd or change in chronic leg swelling. Also denies presyncope, palpitations, heartburn, abdominal pain, nausea, vomiting, diarrhea or change in bowel or urinary habits, dysuria,hematuria, rash, arthralgias, visual complaints, headache, numbness weakness or ataxia.  Objective:   Physical Exam  Gen. Pleasant, obese, in no distress ENT - no lesions, no post nasal drip Neck: No JVD, no thyromegaly, no carotid bruits Lungs: no use of accessory muscles, no dullness to percussion, decreased without rales or rhonchi  Cardiovascular: Rhythm regular, heart sounds  normal, no murmurs or gallops, no peripheral edema Musculoskeletal: No deformities, no cyanosis or clubbing , no tremors       Assessment & Plan:

## 2016-11-26 NOTE — Assessment & Plan Note (Signed)
Stable on dulera

## 2016-12-11 ENCOUNTER — Telehealth: Payer: Self-pay | Admitting: Pulmonary Disease

## 2016-12-11 DIAGNOSIS — G4733 Obstructive sleep apnea (adult) (pediatric): Secondary | ICD-10-CM

## 2016-12-11 NOTE — Telephone Encounter (Signed)
Spoke with patient. He stated that during his last OV, RA stated he could have the pressure increased. The order was never sent to [facility]. Advised patient that I will send the order to [facility] now. He verbalized understanding. Nothing else was needed at time of call.

## 2016-12-16 DIAGNOSIS — G4733 Obstructive sleep apnea (adult) (pediatric): Secondary | ICD-10-CM | POA: Diagnosis not present

## 2016-12-17 DIAGNOSIS — I421 Obstructive hypertrophic cardiomyopathy: Secondary | ICD-10-CM | POA: Diagnosis not present

## 2016-12-18 ENCOUNTER — Encounter: Payer: Self-pay | Admitting: Family Medicine

## 2017-01-13 ENCOUNTER — Encounter: Payer: Self-pay | Admitting: Family Medicine

## 2017-01-13 DIAGNOSIS — H25043 Posterior subcapsular polar age-related cataract, bilateral: Secondary | ICD-10-CM | POA: Diagnosis not present

## 2017-01-13 DIAGNOSIS — H524 Presbyopia: Secondary | ICD-10-CM | POA: Diagnosis not present

## 2017-01-13 DIAGNOSIS — H2513 Age-related nuclear cataract, bilateral: Secondary | ICD-10-CM | POA: Diagnosis not present

## 2017-01-13 DIAGNOSIS — E119 Type 2 diabetes mellitus without complications: Secondary | ICD-10-CM | POA: Diagnosis not present

## 2017-01-13 DIAGNOSIS — H5203 Hypermetropia, bilateral: Secondary | ICD-10-CM | POA: Diagnosis not present

## 2017-01-13 LAB — HM DIABETES EYE EXAM

## 2017-01-22 DIAGNOSIS — H25042 Posterior subcapsular polar age-related cataract, left eye: Secondary | ICD-10-CM | POA: Diagnosis not present

## 2017-01-22 DIAGNOSIS — H25011 Cortical age-related cataract, right eye: Secondary | ICD-10-CM | POA: Diagnosis not present

## 2017-01-22 DIAGNOSIS — H2512 Age-related nuclear cataract, left eye: Secondary | ICD-10-CM | POA: Diagnosis not present

## 2017-01-22 DIAGNOSIS — H2511 Age-related nuclear cataract, right eye: Secondary | ICD-10-CM | POA: Diagnosis not present

## 2017-01-23 ENCOUNTER — Other Ambulatory Visit: Payer: Self-pay | Admitting: *Deleted

## 2017-01-23 DIAGNOSIS — J209 Acute bronchitis, unspecified: Secondary | ICD-10-CM

## 2017-01-23 MED ORDER — FLUTICASONE PROPIONATE 50 MCG/ACT NA SUSP: 2.0000 | Freq: Every day | NASAL | 3 refills | Status: DC

## 2017-01-29 DIAGNOSIS — H2512 Age-related nuclear cataract, left eye: Secondary | ICD-10-CM | POA: Diagnosis not present

## 2017-01-29 DIAGNOSIS — H25042 Posterior subcapsular polar age-related cataract, left eye: Secondary | ICD-10-CM | POA: Diagnosis not present

## 2017-02-03 ENCOUNTER — Encounter: Payer: Medicare Other | Admitting: Family Medicine

## 2017-02-14 ENCOUNTER — Telehealth: Payer: Self-pay | Admitting: *Deleted

## 2017-02-14 NOTE — Telephone Encounter (Signed)
Pts wife LMOM on 02/14/17 at 12:28pm stating that pt has cough and she feels it might be bronchitis. She wanted to see if Dr. Anitra Lauth would send in "Hydromet". She stated that they called the pharmacy and the pharmacy told them they did not have Rx on file. She stated that pt has an apt for CPE coming up but didn't want to wait til then if he could get something now.   I SW pt and advised him that we can not prescribe this medication with out seeing him in the office for a visit. Due to the STOPact that started 01/27/17. I offered to schedule pt an apt at [facility]. Pt declined. Pt voiced understanding and said that he would wait til his CPE.

## 2017-02-28 ENCOUNTER — Encounter: Payer: Self-pay | Admitting: Family Medicine

## 2017-02-28 ENCOUNTER — Ambulatory Visit (INDEPENDENT_AMBULATORY_CARE_PROVIDER_SITE_OTHER): Payer: Medicare Other | Admitting: Family Medicine

## 2017-02-28 VITALS — BP 131/71 | HR 56 | Temp 98.0°F | Resp 16 | Ht 69.0 in | Wt 279.5 lb

## 2017-02-28 DIAGNOSIS — Z0000 Encounter for general adult medical examination without abnormal findings: Secondary | ICD-10-CM | POA: Diagnosis not present

## 2017-02-28 DIAGNOSIS — E119 Type 2 diabetes mellitus without complications: Secondary | ICD-10-CM

## 2017-02-28 DIAGNOSIS — Z23 Encounter for immunization: Secondary | ICD-10-CM | POA: Diagnosis not present

## 2017-02-28 DIAGNOSIS — I10 Essential (primary) hypertension: Secondary | ICD-10-CM | POA: Diagnosis not present

## 2017-02-28 DIAGNOSIS — Z125 Encounter for screening for malignant neoplasm of prostate: Secondary | ICD-10-CM | POA: Diagnosis not present

## 2017-02-28 DIAGNOSIS — E7800 Pure hypercholesterolemia, unspecified: Secondary | ICD-10-CM | POA: Diagnosis not present

## 2017-02-28 LAB — LIPID PANEL
CHOL/HDL RATIO: 3
Cholesterol: 97 mg/dL (ref 0–200)
HDL: 35.4 mg/dL — AB (ref >39.00)
LDL Cholesterol: 43 mg/dL (ref 0–99)
NONHDL: 61.74
Triglycerides: 95 mg/dL (ref 0.0–149.0)
VLDL: 19 mg/dL (ref 0.0–40.0)

## 2017-02-28 LAB — PSA, MEDICARE: PSA: 0.81 ng/mL (ref 0.10–4.00)

## 2017-02-28 LAB — BASIC METABOLIC PANEL
BUN: 13 mg/dL (ref 6–23)
CALCIUM: 9.4 mg/dL (ref 8.4–10.5)
CHLORIDE: 107 meq/L (ref 96–112)
CO2: 29 mEq/L (ref 19–32)
CREATININE: 1.04 mg/dL (ref 0.40–1.50)
GFR: 75.03 mL/min (ref >60.00)
Glucose, Bld: 122 mg/dL — ABNORMAL HIGH (ref 70–99)
Potassium: 4.4 mEq/L (ref 3.5–5.1)
Sodium: 141 mEq/L (ref 135–145)

## 2017-02-28 LAB — HEMOGLOBIN A1C: Hgb A1c MFr Bld: 7.8 % — ABNORMAL HIGH (ref 4.6–6.5)

## 2017-02-28 MED ORDER — ZOSTER VAC RECOMB ADJUVANTED 50 MCG/0.5ML IM SUSR: 0.5000 mL | Freq: Once | INTRAMUSCULAR | 1 refills | Status: AC

## 2017-02-28 NOTE — Patient Instructions (Signed)
Schedule colonoscopy  Shingles vaccine at pharmacy  Bring a copy of your living will and/or healthcare power of attorney to your next office visit.   Health Maintenance, Male A healthy lifestyle and preventive care is important for your health and wellness. Ask your health care provider about what schedule of regular examinations is right for you. What should I know about weight and diet? Eat a Healthy Diet  Eat plenty of vegetables, fruits, whole grains, low-fat dairy products, and lean protein.  Do not eat a lot of foods high in solid fats, added sugars, or salt.  Maintain a Healthy Weight Regular exercise can help you achieve or maintain a healthy weight. You should:  Do at least 150 minutes of exercise each week. The exercise should increase your heart rate and make you sweat (moderate-intensity exercise).  Do strength-training exercises at least twice a week.  Watch Your Levels of Cholesterol and Blood Lipids  Have your blood tested for lipids and cholesterol every 5 years starting at 70 years of age. If you are at high risk for heart disease, you should start having your blood tested when you are 70 years old. You may need to have your cholesterol levels checked more often if: ? Your lipid or cholesterol levels are high. ? You are older than 70 years of age. ? You are at high risk for heart disease.  What should I know about cancer screening? Many types of cancers can be detected early and may often be prevented. Lung Cancer  You should be screened every year for lung cancer if: ? You are a current smoker who has smoked for at least 30 years. ? You are a former smoker who has quit within the past 15 years.  Talk to your health care provider about your screening options, when you should start screening, and how often you should be screened.  Colorectal Cancer  Routine colorectal cancer screening usually begins at 70 years of age and should be repeated every 5-10 years  until you are 69 years old. You may need to be screened more often if early forms of precancerous polyps or small growths are found. Your health care provider may recommend screening at an earlier age if you have risk factors for colon cancer.  Your health care provider may recommend using home test kits to check for hidden blood in the stool.  A small camera at the end of a tube can be used to examine your colon (sigmoidoscopy or colonoscopy). This checks for the earliest forms of colorectal cancer.  Prostate and Testicular Cancer  Depending on your age and overall health, your health care provider may do certain tests to screen for prostate and testicular cancer.  Talk to your health care provider about any symptoms or concerns you have about testicular or prostate cancer.  Skin Cancer  Check your skin from head to toe regularly.  Tell your health care provider about any new moles or changes in moles, especially if: ? There is a change in a mole's size, shape, or color. ? You have a mole that is larger than a pencil eraser.  Always use sunscreen. Apply sunscreen liberally and repeat throughout the day.  Protect yourself by wearing long sleeves, pants, a wide-brimmed hat, and sunglasses when outside.  What should I know about heart disease, diabetes, and high blood pressure?  If you are 50-84 years of age, have your blood pressure checked every 3-5 years. If you are 71 years of age or  older, have your blood pressure checked every year. You should have your blood pressure measured twice-once when you are at a hospital or clinic, and once when you are not at a hospital or clinic. Record the average of the two measurements. To check your blood pressure when you are not at a hospital or clinic, you can use: ? An automated blood pressure machine at a pharmacy. ? A home blood pressure monitor.  Talk to your health care provider about your target blood pressure.  If you are between 91-15  years old, ask your health care provider if you should take aspirin to prevent heart disease.  Have regular diabetes screenings by checking your fasting blood sugar level. ? If you are at a normal weight and have a low risk for diabetes, have this test once every three years after the age of 70. ? If you are overweight and have a high risk for diabetes, consider being tested at a younger age or more often.  A one-time screening for abdominal aortic aneurysm (AAA) by ultrasound is recommended for men aged 42-75 years who are current or former smokers. What should I know about preventing infection? Hepatitis B If you have a higher risk for hepatitis B, you should be screened for this virus. Talk with your health care provider to find out if you are at risk for hepatitis B infection. Hepatitis C Blood testing is recommended for:  Everyone born from [birth year] through 1964.  Anyone with known risk factors for hepatitis C.  Sexually Transmitted Diseases (STDs)  You should be screened each year for STDs including gonorrhea and chlamydia if: ? You are sexually active and are younger than 71 years of age. ? You are older than 70 years of age and your health care provider tells you that you are at risk for this type of infection. ? Your sexual activity has changed since you were last screened and you are at an increased risk for chlamydia or gonorrhea. Ask your health care provider if you are at risk.  Talk with your health care provider about whether you are at high risk of being infected with HIV. Your health care provider may recommend a prescription medicine to help prevent HIV infection.  What else can I do?  Schedule regular health, dental, and eye exams.  Stay current with your vaccines (immunizations).  Do not use any tobacco products, such as cigarettes, chewing tobacco, and e-cigarettes. If you need help quitting, ask your health care provider.  Limit alcohol intake to no more than 2  drinks per day. One drink equals 12 ounces of beer, 5 ounces of wine, or 1 ounces of hard liquor.  Do not use street drugs.  Do not share needles.  Ask your health care provider for help if you need support or information about quitting drugs.  Tell your health care provider if you often feel depressed.  Tell your health care provider if you have ever been abused or do not feel safe at home. This information is not intended to replace advice given to you by your health care provider. Make sure you discuss any questions you have with your health care provider. Document Released: 10/12/2007 Document Revised: 12/13/2015 Document Reviewed: 01/17/2015 Elsevier Interactive Patient Education  [employer].

## 2017-02-28 NOTE — Progress Notes (Signed)
Subjective:   Jack [name] is a 71 y.o. male who presents for Medicare Annual/Subsequent preventive examination.  Review of Systems:  No ROS.  Medicare Wellness Visit. Additional risk factors are reflected in the social history.  Cardiac Risk Factors include: advanced age (>39men, >47 women);diabetes mellitus;dyslipidemia;hypertension;male gender;obesity (BMI >30kg/m2)   Sleep patterns: Sleeps 6 hours. Uses CPAP.  Home Safety/Smoke Alarms: Feels safe in home. Smoke alarms in place.  Living environment; residence and Firearm Safety: Lives with wife in 1 story home.  Seat Belt Safety/Bike Helmet: Wears seat belt.   Male:   CCS-Colonoscopy 04/29/2006. Patient to call [facility] to schedule.       PSA-  Lab Results  Component Value Date   PSA 0.70 11/28/2015   PSA 0.71 04/19/2014   PSA 0.73 06/08/2012       Objective:    Vitals: BP 131/71 (BP Location: Left Arm, Patient Position: Sitting, Cuff Size: Large)   Pulse (!) 56   Temp 98 F (36.7 C) (Oral)   Resp 16   Ht 5\' 9"  (1.753 m)   Wt 279 lb 8 oz (126.8 kg)   SpO2 96%   BMI 41.27 kg/m   Body mass index is 41.27 kg/m.  Tobacco History  Smoking Status  . Former Smoker  . Packs/day: 0.50  . Years: 40.00  . Types: Cigarettes, Cigars  . Quit date: 04/29/1998  Smokeless Tobacco  . Never Used    Comment: 4 cigars     Counseling given: Not Answered   Past Medical History:  Diagnosis Date  . Carotid bruit 05/28/10   Tortuosity of carotids on doppler u/s, no stenosis.  . Diabetes mellitus    type 2- non insulin-requiring  . Dyspnea    Cardiology w/u unrevealing except LVOT obstruction: suspect dyspnea due to HOCM, morbid obesity and OSA  . Fatty liver u/s and CT 2009   w/mildly elevated transaminases; u/s 12/2012 reconfirmed dx.  Hep B and C testing negative.  Marland Kitchen GERD (gastroesophageal reflux disease)    EDG 04-03-01, + distal esophageal stricture dilation  . Gout   . Hiatal hernia   . Hyperlipidemia   .  Hypertension    severe LVH on echo  . 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  . Moderate persistent asthma 04/19/2014  . Morbid obesity ([place])   . OSA on CPAP    Could not tolerate CPAP, not surgical candidate per Dr. Benjamine Mola.  Restarted CPAP (auto titrate 5-15 with full facemask--Dr. Alva 2017.  Getting mask fitting/leak issues worked out as of 07/2016 pulm f/u.  . Osteoarthritis, multiple sites    primarily knees and back.  . Stricture and stenosis of esophagus   . Venous insufficiency of both lower extremities    Past Surgical History:  Procedure Laterality Date  . CARDIOVASCULAR STRESS TEST  2006 and 04/27/10   Normal stress nuclear study, normal EF  . COLONOSCOPY  x 2   Dr. Sharlett Iles w/ [facility]: latest 04/2006  . NASAL SINUS SURGERY    . Sleep study  07/2016   OSA; CPAP trial at 10 cm H20 recommended by pulm (Dr. Tennis Must Dios--[facility]).  . [place]   ? Discectomy  . TEE WITHOUT CARDIOVERSION  05/2010   Dr. Einar Gip; severe LVH, no valvular abnormalities  . TRANSESOPHAGEAL ECHOCARDIOGRAM  06/06/10; 12/2011; 09/2014;  11/2016   LVOT obst with 135 mm Hg PG.  Not HOCM.   No mitral regurg..  Repeat echo 01/09/12 no change.  2016: EF 60%, severe conc LV hypertrophy, normal global wall motion, LA mildly dilated, systolic anterior motion of mitral valve chord, mild MR, mild TR, mild pulm HTN--no signif change since 2013.  Repeat echo 11/2016 no signif change compared to prior studies.  . TRANSTHORACIC ECHOCARDIOGRAM  05/28/10   Normal LVEF, LVOT obstruction, lateral wall hypokinesis   Family History  Problem Relation Age of Onset  . Prostate cancer Father        Prostate  . Hypertension Neg Hx   . Coronary artery disease Neg Hx   . Colon cancer Neg Hx   . Stomach cancer Neg Hx   . Rectal cancer Neg Hx   . Breast cancer  Sister   . Diabetes Mother   . Diabetes Maternal Aunt   . Breast cancer Maternal Aunt    History  Sexual Activity  . Sexual activity: Not on file    Outpatient Encounter Prescriptions as of 02/28/2017  Medication Sig  . allopurinol (ZYLOPRIM) 300 MG tablet 1 tab po bid  . aspirin 325 MG tablet Take 325 mg by mouth daily.    . Cholecalciferol (VITAMIN D) 2000 UNITS tablet Take 2,000 Units by mouth daily.  Marland Kitchen eplerenone (INSPRA) 50 MG tablet Take 50 mg by mouth daily. Started 09/28/14 by Dr. Einar Gip  . fluticasone (FLONASE) 50 MCG/ACT nasal spray Place 2 sprays into both nostrils daily.  Marland Kitchen glipiZIDE (GLUCOTROL XL) 10 MG 24 hr tablet TAKE 1 TABLET BY MOUTH DAILY  . glucose blood (ONE TOUCH ULTRA TEST) test strip Use as instructed to check blood sugar.  DX 250.00  . metFORMIN (GLUCOPHAGE) 1000 MG tablet TAKE 1 TABLET BY MOUTH TWO  TIMES DAILY WITH MEALS  . metoprolol (LOPRESSOR) 50 MG tablet Take 1 tablet (50 mg total) by mouth 2 (two) times daily. As per Dr. Einar Gip  . mometasone-formoterol (DULERA) 200-5 MCG/ACT AERO Inhale 2 puffs into the lungs 2 (two) times daily.  . ONE TOUCH LANCETS MISC Use as directed to check blood sugar.  DX 250.00  . pantoprazole (PROTONIX) 40 MG tablet Take 1 tablet by mouth  daily  . PROAIR HFA 108 (90 Base) MCG/ACT inhaler INHALE 2 PUFFS INTO THE LUNGS EVERY 4 HOURS AS NEEDED FOR WHEEZING  . rosuvastatin (CRESTOR) 20 MG tablet Take 1 tablet (20 mg total) by mouth at bedtime.  . verapamil (VERELAN PM) 120 MG 24 hr capsule TAKE 1 CAPSULE BY MOUTH  DAILY .  . Zoster Vaccine Adjuvanted [facility]) injection Inject 0.5 mLs into the muscle once.   No facility-administered encounter medications on file as of 02/28/2017.     Activities of Daily Living In your present state of health, do you have any difficulty performing the following activities: 02/28/2017  Hearing? N  Vision? N  Difficulty concentrating or making decisions? N  Walking or climbing stairs? Y  Comment  Knee pain with steps  Dressing or bathing? N  Doing errands, shopping? N  Preparing Food and eating ? N  Using the Toilet? N  In the past six months, have you accidently leaked urine? N  Do you have problems with loss of bowel control? N  Managing your Medications? N  Managing your Finances? N  Housekeeping or managing your Housekeeping? N  Some recent data might be hidden    Patient Care Team: Tammi Sou, MD as PCP -  General Adrian Prows, MD as Consulting Physician (Cardiology) Chesley Mires, MD as Consulting Physician (Pulmonary Disease) Izora Gala, MD as Consulting Physician (Otolaryngology) Rutherford Guys, MD as Consulting Physician (Ophthalmology) Inocencio Homes, DPM as Consulting Physician (Podiatry) Rigoberto Noel, MD as Consulting Physician (Pulmonary Disease) de Scenic, Mike Gip, MD as Consulting Physician (Pulmonary Disease)   Assessment:    Physical assessment deferred to PCP.  Exercise Activities and Dietary recommendations Current Exercise Habits: Home exercise routine (yard work, Scientist, physiological), Exercise limited by: None identified   Diet (meal preparation, eat out, water intake, caffeinated beverages, dairy products, fruits and vegetables): Drinks gatorade and water.   Breakfast: biscuit Lunch: sandwich Dinner: protein and vegetables.     Discussed heart healthy diet and increasing activity.   Goals    . Weight (lb) < 265 lb (120.2 kg)          Lose weight by watching food intake.       Fall Risk Fall Risk  02/28/2017 02/28/2017 11/28/2015 08/15/2015  Falls in the past year? No No No No   Depression Screen PHQ 2/9 Scores 02/28/2017 11/28/2015 08/15/2015  PHQ - 2 Score 0 0 0    Cognitive Function       Ad8 score reviewed for issues:  Issues making decisions: no  Less interest in hobbies / activities: no  Repeats questions, stories (family complaining): no  Trouble using ordinary gadgets (microwave, computer, phone): no  Forgets the month  or year: no  Mismanaging finances: no  Remembering appts: no  Daily problems with thinking and/or memory: no Ad8 score is=0     Immunization History  Administered Date(s) Administered  . Hepatitis B 06/08/2012, 07/10/2012  . Hepatitis B, adult 11/30/2012  . Influenza Split 02/26/2011, 01/16/2012  . Influenza Whole 12/28/2009  . Influenza, High Dose Seasonal PF 01/24/2016  . Influenza,inj,Quad PF,6+ Mos 02/05/2013, 02/02/2014, 01/24/2016  . Influenza-Unspecified 02/04/2015, 12/29/2016  . Pneumococcal Conjugate-13 08/03/2013  . Pneumococcal Polysaccharide-23 06/06/2011, 01/21/2012, 02/28/2017  . Td 04/30/2007  . Zoster 12/03/2011   Screening Tests Health Maintenance  Topic Date Due  . COLONOSCOPY  04/29/2016  . TETANUS/TDAP  04/29/2017  . HEMOGLOBIN A1C  05/23/2017  . FOOT EXAM  11/18/2017  . URINE MICROALBUMIN  11/18/2017  . OPHTHALMOLOGY EXAM  01/13/2018  . INFLUENZA VACCINE  Completed  . Hepatitis C Screening  Completed  . PNA vac Low Risk Adult  Completed      Plan:    Schedule colonoscopy  Shingles vaccine at pharmacy  Bring a copy of your living will and/or healthcare power of attorney to your next office visit.  I have personally reviewed and noted the following in the patient's chart:   . Medical and social history . Use of alcohol, tobacco or illicit drugs  . Current medications and supplements . Functional ability and status . Nutritional status . Physical activity . Advanced directives . List of other physicians . Hospitalizations, surgeries, and ER visits in previous 12 months . Vitals . Screenings to include cognitive, depression, and falls . Referrals and appointments  In addition, I have reviewed and discussed with patient certain preventive protocols, quality metrics, and best practice recommendations. A written personalized care plan for preventive services as well as general preventive health recommendations were provided to patient.      Gerilyn Nestle, RN  02/28/2017

## 2017-02-28 NOTE — Progress Notes (Signed)
AWV reviewed and agree.  Signed:  Crissie Sickles, MD           02/28/2017

## 2017-02-28 NOTE — Progress Notes (Signed)
Office Note 02/28/2017  CC:  Chief Complaint  Patient presents with  . Annual Exam    Pt is fasting.     HPI:  Jack [name] is a 71 y.o. White male who is here for annual health maintenance exam.  Eyes: exam UTD.  Recent cataract extraction went well. Dental: preventatives UTD. Exercise: walks some. Diet: not working on anything in particular.   Past Medical History:  Diagnosis Date  . Carotid bruit 05/28/10   Tortuosity of carotids on doppler u/s, no stenosis.  . Diabetes mellitus    type 2- non insulin-requiring  . Dyspnea    Cardiology w/u unrevealing except LVOT obstruction: suspect dyspnea due to HOCM, morbid obesity and OSA  . Fatty liver u/s and CT 2009   w/mildly elevated transaminases; u/s 12/2012 reconfirmed dx.  Hep B and C testing negative.  Marland Kitchen GERD (gastroesophageal reflux disease)    EDG 04-03-01, + distal esophageal stricture dilation  . Gout   . Hiatal hernia   . Hyperlipidemia   . Hypertension    severe LVH on echo  . Hypertrophic obstructive cardiomyopathy (HOCM) ([place]) 2012   LVOT obstruction stable on echo 01/09/12 and again on f/u echo 09/29/14--with evidence of HOCM due to chordal systolic anterior motion of MV leaflet.  . Moderate persistent asthma 04/19/2014  . Morbid obesity ([place])   . OSA on CPAP    Could not tolerate CPAP, not surgical candidate per Dr. Benjamine Mola.  Restarted CPAP (auto titrate 5-15 with full facemask--Dr. Alva 2017.  Getting mask fitting/leak issues worked out as of 07/2016 pulm f/u.  . Osteoarthritis, multiple sites    primarily knees and back.  . Stricture and stenosis of esophagus   . Venous insufficiency of both lower extremities     Past Surgical History:  Procedure Laterality Date  . CARDIOVASCULAR STRESS TEST  2006 and 04/27/10   Normal stress nuclear study, normal EF  . COLONOSCOPY  x 2   Dr. Sharlett Iles w/ [facility]: latest 04/2006  . NASAL SINUS SURGERY    . Sleep study  07/2016   OSA; CPAP trial at 10 cm H20  recommended by pulm (Dr. Tennis Must Dios--[facility]).  . [place]   ? Discectomy  . TEE WITHOUT CARDIOVERSION  05/2010   Dr. Einar Gip; severe LVH, no valvular abnormalities  . TRANSESOPHAGEAL ECHOCARDIOGRAM  06/06/10; 12/2011; 09/2014; 11/2016   LVOT obst with 135 mm Hg PG.  Not HOCM.   No mitral regurg..  Repeat echo 01/09/12 no change.  2016: EF 60%, severe conc LV hypertrophy, normal global wall motion, LA mildly dilated, systolic anterior motion of mitral valve chord, mild MR, mild TR, mild pulm HTN--no signif change since 2013.  Repeat echo 11/2016 no signif change compared to prior studies.  . TRANSTHORACIC ECHOCARDIOGRAM  05/28/10   Normal LVEF, LVOT obstruction, lateral wall hypokinesis    Family History  Problem Relation Age of Onset  . Prostate cancer Father        Prostate  . Hypertension Neg Hx   . Coronary artery disease Neg Hx   . Colon cancer Neg Hx   . Stomach cancer Neg Hx   . Rectal cancer Neg Hx   . Breast cancer Sister   . Diabetes Mother   . Diabetes Maternal Aunt   . Breast cancer Maternal Aunt     Social History   Social History  . Marital status: Married    Spouse name: N/A  . Number of children: 1  .  Years of education: N/A   Occupational History  . retired Retired   Social History Main Topics  . Smoking status: Former Smoker    Packs/day: 0.50    Years: 40.00    Types: Cigarettes, Cigars    Quit date: 04/29/1998  . Smokeless tobacco: Never Used     Comment: 4 cigars  . Alcohol use No     Comment: occasionally  . Drug use: No  . Sexual activity: Not on file   Other Topics Concern  . Not on file   Social History Narrative   Married, 1 son, 1 granddaughter.   Retired from [employer] 2000.   Hobbies: trading farm equipment.  Used to be a Environmental manager.   Tobacco x many years, quit @ 2005   No ETOH or drugs.   No regular exercise.Smoking Status:  quit             Outpatient Medications Prior to Visit  Medication  Sig Dispense Refill  . allopurinol (ZYLOPRIM) 300 MG tablet 1 tab po bid 180 tablet 3  . aspirin 325 MG tablet Take 325 mg by mouth daily.      . Cholecalciferol (VITAMIN D) 2000 UNITS tablet Take 2,000 Units by mouth daily.    Marland Kitchen eplerenone (INSPRA) 50 MG tablet Take 50 mg by mouth daily. Started 09/28/14 by Dr. Einar Gip    . fluticasone (FLONASE) 50 MCG/ACT nasal spray Place 2 sprays into both nostrils daily. 48 g 3  . glipiZIDE (GLUCOTROL XL) 10 MG 24 hr tablet TAKE 1 TABLET BY MOUTH DAILY 90 tablet 0  . glucose blood (ONE TOUCH ULTRA TEST) test strip Use as instructed to check blood sugar.  DX 250.00 100 each prn  . metFORMIN (GLUCOPHAGE) 1000 MG tablet TAKE 1 TABLET BY MOUTH TWO  TIMES DAILY WITH MEALS 30 tablet 0  . metoprolol (LOPRESSOR) 50 MG tablet Take 1 tablet (50 mg total) by mouth 2 (two) times daily. As per Dr. Einar Gip 180 tablet 4  . mometasone-formoterol (DULERA) 200-5 MCG/ACT AERO Inhale 2 puffs into the lungs 2 (two) times daily. 1 Inhaler 11  . ONE TOUCH LANCETS MISC Use as directed to check blood sugar.  DX 250.00 200 each prn  . pantoprazole (PROTONIX) 40 MG tablet Take 1 tablet by mouth  daily 90 tablet 3  . PROAIR HFA 108 (90 Base) MCG/ACT inhaler INHALE 2 PUFFS INTO THE LUNGS EVERY 4 HOURS AS NEEDED FOR WHEEZING 8.5 g 0  . rosuvastatin (CRESTOR) 20 MG tablet Take 1 tablet (20 mg total) by mouth at bedtime. 30 tablet 3  . verapamil (VERELAN PM) 120 MG 24 hr capsule TAKE 1 CAPSULE BY MOUTH  DAILY . 90 capsule 0   No facility-administered medications prior to visit.     No Known Allergies  ROS Review of Systems  Constitutional: Negative for appetite change, chills, fatigue and fever.  HENT: Negative for congestion, dental problem, ear pain and sore throat.   Eyes: Negative for discharge, redness and visual disturbance.  Respiratory: Negative for cough, chest tightness, shortness of breath and wheezing.   Cardiovascular: Negative for chest pain, palpitations and leg swelling.   Gastrointestinal: Negative for abdominal pain, blood in stool, diarrhea, nausea and vomiting.  Genitourinary: Negative for difficulty urinating, dysuria, flank pain, frequency, hematuria and urgency.  Musculoskeletal: Negative for arthralgias, back pain, joint swelling, myalgias and neck stiffness.  Skin: Negative for pallor and rash.  Neurological: Negative for dizziness, speech difficulty, weakness and headaches.  Hematological: Negative for adenopathy. Does not bruise/bleed easily.  Psychiatric/Behavioral: Negative for confusion and sleep disturbance. The patient is not nervous/anxious.     PE; Blood pressure 131/71, pulse (!) 56, temperature 98 F (36.7 C), temperature source Oral, resp. rate 16, height 5\' 9"  (1.753 m), weight 279 lb 8 oz (126.8 kg), SpO2 96 %. Gen: Alert, well appearing.  Patient is oriented to person, place, time, and situation. AFFECT: pleasant, lucid thought and speech. ENT: Ears: EACs clear, normal epithelium.  TMs with good light reflex and landmarks bilaterally.  Eyes: no injection, icteris, swelling, or exudate.  EOMI, PERRLA. Nose: no drainage or turbinate edema/swelling.  No injection or focal lesion.  Mouth: lips without lesion/swelling.  Oral mucosa pink and moist.  Dentition intact and without obvious caries or gingival swelling.  Oropharynx without erythema, exudate, or swelling.  Neck: supple/nontender.  No LAD, mass, or TM.  Carotid pulses 2+ bilaterally, without bruits. CV: RRR, 3/6 holosystolic murmur.  No r/g.   LUNGS: CTA bilat, nonlabored resps, good aeration in all lung fields. ABD: soft, NT, ND, BS normal.  No hepatospenomegaly or mass.  No bruits. EXT: no clubbing or cyanosis.  2+ pitting edema in both ankles/tops of feet.  Musculoskeletal: no joint swelling, erythema, warmth, or tenderness.  ROM of all joints intact. Skin - no sores or suspicious lesions or rashes or color changes   Pertinent labs:   Lab Results  Component Value Date    LABURIC 5.4 11/20/2016    Lab Results  Component Value Date   TSH 3.08 04/19/2014   No results found for: WBC, HGB, HCT, MCV, PLT Lab Results  Component Value Date   CREATININE 1.10 11/20/2016   BUN 14 11/20/2016   NA 141 11/20/2016   K 4.3 11/20/2016   CL 107 11/20/2016   CO2 29 11/20/2016   Lab Results  Component Value Date   ALT 34 11/20/2016   AST 32 11/20/2016   ALKPHOS 57 11/20/2016   BILITOT 0.6 11/20/2016   Lab Results  Component Value Date   CHOL 174 11/20/2016   Lab Results  Component Value Date   HDL 34.10 (L) 11/20/2016   Lab Results  Component Value Date   LDLCALC 111 (H) 11/20/2016   Lab Results  Component Value Date   TRIG 149.0 11/20/2016   Lab Results  Component Value Date   CHOLHDL 5 11/20/2016   Lab Results  Component Value Date   PSA 0.70 11/28/2015   PSA 0.71 04/19/2014   PSA 0.73 06/08/2012   Lab Results  Component Value Date   HGBA1C 7.5 (H) 11/20/2016    ASSESSMENT AND PLAN:   Health maintenance exam: Reviewed age and gender appropriate health maintenance issues (prudent diet, regular exercise, health risks of tobacco and excessive alcohol, use of seatbelts, fire alarms in home, use of sunscreen).  Also reviewed age and gender appropriate health screening as well as vaccine recommendations. Vaccines: pneumovax #23 (final)---given today.    Flu vaccine--UTD.    Shingrix discussed--he could not afford. Labs: BMET, FLP, A1c, PSA. Prostate ca screening: DRE normal today , PSA. Colon ca screening: next colonoscopy was due after 04/2006--he'll call [facility] to set this up.  An After Visit Summary was printed and given to the patient.  FOLLOW UP:  No Follow-up on file.  Signed:  Crissie Sickles, MD           02/28/2017

## 2017-03-03 ENCOUNTER — Other Ambulatory Visit: Payer: Self-pay | Admitting: Family Medicine

## 2017-03-03 ENCOUNTER — Other Ambulatory Visit: Payer: Self-pay | Admitting: *Deleted

## 2017-03-03 MED ORDER — METFORMIN HCL 1000 MG PO TABS: ORAL_TABLET | ORAL | 1 refills | Status: DC

## 2017-03-03 MED ORDER — CANAGLIFLOZIN 100 MG PO TABS: 100.0000 mg | ORAL_TABLET | Freq: Every day | ORAL | 3 refills | Status: DC

## 2017-03-03 MED ORDER — ALLOPURINOL 300 MG PO TABS: ORAL_TABLET | ORAL | 3 refills | Status: DC

## 2017-03-03 MED ORDER — GLIPIZIDE ER 10 MG PO TB24: 10.0000 mg | ORAL_TABLET | Freq: Every day | ORAL | 1 refills | Status: DC

## 2017-03-03 NOTE — Telephone Encounter (Signed)
[facility]  RF request for allopurinol LOV: 02/28/17 Next ov: 03/06/18 Last written: last Rx sent to local  Please advise. Thanks.

## 2017-03-28 DIAGNOSIS — I421 Obstructive hypertrophic cardiomyopathy: Secondary | ICD-10-CM | POA: Diagnosis not present

## 2017-03-28 DIAGNOSIS — G4733 Obstructive sleep apnea (adult) (pediatric): Secondary | ICD-10-CM | POA: Diagnosis not present

## 2017-03-29 ENCOUNTER — Encounter: Payer: Self-pay | Admitting: Family Medicine

## 2017-04-06 ENCOUNTER — Other Ambulatory Visit: Payer: Self-pay | Admitting: Family Medicine

## 2017-04-21 ENCOUNTER — Other Ambulatory Visit: Payer: Self-pay | Admitting: Family Medicine

## 2017-04-30 ENCOUNTER — Other Ambulatory Visit: Payer: Self-pay | Admitting: *Deleted

## 2017-04-30 MED ORDER — ROSUVASTATIN CALCIUM 20 MG PO TABS: 20.0000 mg | ORAL_TABLET | Freq: Every day | ORAL | 1 refills | Status: DC

## 2017-05-06 ENCOUNTER — Telehealth: Payer: Self-pay | Admitting: *Deleted

## 2017-05-06 ENCOUNTER — Encounter: Payer: Self-pay | Admitting: Family Medicine

## 2017-05-06 ENCOUNTER — Ambulatory Visit (HOSPITAL_BASED_OUTPATIENT_CLINIC_OR_DEPARTMENT_OTHER)
Admission: RE | Admit: 2017-05-06 | Discharge: 2017-05-06 | Disposition: A | Discharge status: Home / Self Care | Payer: Medicare Other | Source: Ambulatory Visit | Attending: Family Medicine | Admitting: Family Medicine

## 2017-05-06 ENCOUNTER — Ambulatory Visit (INDEPENDENT_AMBULATORY_CARE_PROVIDER_SITE_OTHER): Payer: Medicare Other | Admitting: Family Medicine

## 2017-05-06 VITALS — BP 128/71 | HR 68 | Temp 97.9°F | Resp 20 | Wt 273.8 lb

## 2017-05-06 DIAGNOSIS — J40 Bronchitis, not specified as acute or chronic: Secondary | ICD-10-CM | POA: Diagnosis not present

## 2017-05-06 DIAGNOSIS — R062 Wheezing: Secondary | ICD-10-CM | POA: Diagnosis not present

## 2017-05-06 DIAGNOSIS — R05 Cough: Secondary | ICD-10-CM

## 2017-05-06 DIAGNOSIS — R059 Cough, unspecified: Secondary | ICD-10-CM

## 2017-05-06 DIAGNOSIS — J4540 Moderate persistent asthma, uncomplicated: Secondary | ICD-10-CM | POA: Diagnosis not present

## 2017-05-06 DIAGNOSIS — R0602 Shortness of breath: Secondary | ICD-10-CM | POA: Diagnosis not present

## 2017-05-06 MED ORDER — IPRATROPIUM-ALBUTEROL 0.5-2.5 (3) MG/3ML IN SOLN
3.0000 mL | Freq: Once | RESPIRATORY_TRACT | Status: AC
  Administered 2017-05-06: 3 mL via RESPIRATORY_TRACT

## 2017-05-06 MED ORDER — DOXYCYCLINE HYCLATE 100 MG PO TABS: 100.0000 mg | ORAL_TABLET | Freq: Two times a day (BID) | ORAL | 0 refills | Status: DC

## 2017-05-06 MED ORDER — PREDNISONE 50 MG PO TABS: 50.0000 mg | ORAL_TABLET | Freq: Every day | ORAL | 0 refills | Status: DC

## 2017-05-06 MED ORDER — ALBUTEROL SULFATE HFA 108 (90 BASE) MCG/ACT IN AERS: INHALATION_SPRAY | RESPIRATORY_TRACT | 0 refills | Status: DC

## 2017-05-06 MED ORDER — MOMETASONE FURO-FORMOTEROL FUM 200-5 MCG/ACT IN AERO: 2.0000 | INHALATION_SPRAY | Freq: Two times a day (BID) | RESPIRATORY_TRACT | 0 refills | Status: DC

## 2017-05-06 NOTE — Telephone Encounter (Signed)
Patient  states he can't afford his invokana. He states its $145.00 a month and he is on fiixed income. He is requesting affordable alternative. Please advise.

## 2017-05-06 NOTE — Progress Notes (Signed)
Jack [name] , 04-20-1946, 72 y.o., male MRN: [id number] Patient Care Team    Relationship Specialty Notifications Start End  Tammi Sou, MD PCP - General   05/16/10    Comment: Donne Anon, MD Consulting Physician Cardiology  06/10/11   Chesley Mires, MD Consulting Physician Pulmonary Disease  05/27/12   Izora Gala, MD Consulting Physician Otolaryngology  11/03/12   Rutherford Guys, MD Consulting Physician Ophthalmology  04/20/14   Inocencio Homes, [facility] Consulting Physician Podiatry  01/18/16   Rigoberto Noel, MD Consulting Physician Pulmonary Disease  01/29/16   de Larkin Ina, Mike Gip, MD Consulting Physician Pulmonary Disease  08/27/16    Comment: Sleep medicine    Chief Complaint  Patient presents with  . URI    congestion,cough x 1 week     Subjective: Pt presents for an OV with complaints of cough, chest and nasal congestion of 1 week duration.  Associated symptoms include Non- productive cough, chest burning with cough and wheezing. He is prescribed dulera, which he admits to not taking. He has been using the albuterol inhaler about 3 times day since onset of symptoms. He denies fever, chills, nausea, vomit or diarrhea.  Depression screen [facility] 2/9 02/28/2017 11/28/2015 08/15/2015  Decreased Interest 0 0 0  Down, Depressed, Hopeless 0 0 0  PHQ - 2 Score 0 0 0    No Known Allergies Social History   Tobacco Use  . Smoking status: Former Smoker    Packs/day: 0.50    Years: 40.00    Pack years: 20.00    Types: Cigarettes, Cigars    Last attempt to quit: 04/29/1998    Years since quitting: 19.0  . Smokeless tobacco: Never Used  . Tobacco comment: 4 cigars  Substance Use Topics  . Alcohol use: No    Comment: occasionally   Past Medical History:  Diagnosis Date  . Carotid bruit 05/28/10   Tortuosity of carotids on doppler u/s, no stenosis.  . Diabetes mellitus    type 2- non insulin-requiring  . DOE (dyspnea on exertion)    Cardiology w/u unrevealing except LVOT  obstruction: suspect dyspnea due to HOCM, morbid obesity and OSA--stable as of 02/2017 cardiol f/u.  Marland Kitchen Fatty liver u/s and CT 2009   w/mildly elevated transaminases; u/s 12/2012 reconfirmed dx.  Hep B and C testing negative.  Marland Kitchen GERD (gastroesophageal reflux disease)    EDG 04-03-01, + distal esophageal stricture dilation  . Gout   . Hiatal hernia   . Hyperlipidemia   . Hypertension    severe LVH on echo  . Hypertrophic obstructive cardiomyopathy (HOCM) ([place]) 2012   LVOT obstruction stable on echo 01/09/12 and again on f/u echo 09/29/14--with evidence of HOCM due to chordal systolic anterior motion of MV leaflet.  . Moderate persistent asthma 04/19/2014  . Morbid obesity ([place])   . OSA on CPAP    Could not tolerate CPAP, not surgical candidate per Dr. Benjamine Mola.  Restarted CPAP (auto titrate 5-15 with full facemask--Dr. Alva 2017.  Getting mask fitting/leak issues worked out as of 07/2016 pulm f/u.  . Osteoarthritis, multiple sites    primarily knees and back.  . Stricture and stenosis of esophagus   . Venous insufficiency of both lower extremities    Past Surgical History:  Procedure Laterality Date  . CARDIOVASCULAR STRESS TEST  2006 and 04/27/10   Normal stress nuclear study, normal EF  . COLONOSCOPY  x 2   Dr. Sharlett Iles w/ [facility]:  latest 04/2006  . NASAL SINUS SURGERY    . Sleep study  07/2016   OSA; CPAP trial at 10 cm H20 recommended by pulm (Dr. Tennis Must Dios--[place] pulm).  . [place]   ? Discectomy  . TEE WITHOUT CARDIOVERSION  05/2010   Dr. Einar Gip; severe LVH, no valvular abnormalities  . TRANSESOPHAGEAL ECHOCARDIOGRAM  06/06/10; 12/2011; 09/2014; 11/2016   LVOT obst with 135 mm Hg PG.  Not HOCM.   No mitral regurg..  Repeat echo 01/09/12 no change.  2016: EF 60%, severe conc LV hypertrophy, normal global wall motion, LA mildly dilated, systolic anterior motion of mitral valve chord, mild MR, mild TR, mild pulm HTN--no signif change since 2013.  Repeat echo 11/2016 no signif change  compared to prior studies.  . TRANSTHORACIC ECHOCARDIOGRAM  05/28/10   Normal LVEF, LVOT obstruction, lateral wall hypokinesis   Family History  Problem Relation Age of Onset  . Diabetes Mother   . Prostate cancer Father        Prostate  . Breast cancer Sister   . Diabetes Maternal Aunt   . Breast cancer Maternal Aunt   . Hypertension Neg Hx   . Coronary artery disease Neg Hx   . Colon cancer Neg Hx   . Stomach cancer Neg Hx   . Rectal cancer Neg Hx    Allergies as of 05/06/2017   No Known Allergies     Medication List        Accurate as of 05/06/17  1:29 PM. Always use your most recent med list.          allopurinol 300 MG tablet Commonly known as:  ZYLOPRIM 1 tab po bid   aspirin 325 MG tablet Take 325 mg by mouth daily.   canagliflozin 100 MG Tabs tablet Commonly known as:  INVOKANA Take 1 tablet (100 mg total) daily before breakfast by mouth.   eplerenone 50 MG tablet Commonly known as:  INSPRA Take 50 mg by mouth daily. Started 09/28/14 by Dr. Einar Gip   fluticasone 50 MCG/ACT nasal spray Commonly known as:  FLONASE Place 2 sprays into both nostrils daily.   glipiZIDE 10 MG 24 hr tablet Commonly known as:  GLUCOTROL XL Take 1 tablet (10 mg total) daily by mouth.   glucose blood test strip Commonly known as:  ONE TOUCH ULTRA TEST Use as instructed to check blood sugar.  DX 250.00   metFORMIN 1000 MG tablet Commonly known as:  GLUCOPHAGE TAKE 1 TABLET BY MOUTH TWO  TIMES DAILY WITH MEALS   metoprolol tartrate 50 MG tablet Commonly known as:  LOPRESSOR Take 1 tablet (50 mg total) by mouth 2 (two) times daily. As per Dr. Einar Gip   mometasone-formoterol 200-5 MCG/ACT Aero Commonly known as:  DULERA Inhale 2 puffs into the lungs 2 (two) times daily.   ONE TOUCH LANCETS Misc Use as directed to check blood sugar.  DX 250.00   pantoprazole 40 MG tablet Commonly known as:  PROTONIX TAKE 1 TABLET BY MOUTH  DAILY   PROAIR HFA 108 (90 Base) MCG/ACT  inhaler Generic drug:  albuterol INHALE 2 PUFFS INTO THE LUNGS EVERY 4 HOURS AS NEEDED FOR WHEEZING   rosuvastatin 20 MG tablet Commonly known as:  CRESTOR Take 1 tablet (20 mg total) by mouth at bedtime.   verapamil 120 MG 24 hr capsule Commonly known as:  VERELAN PM TAKE 1 CAPSULE BY MOUTH  DAILY .   Vitamin D 2000 units tablet Take 2,000 Units by  mouth daily.       All past medical history, surgical history, allergies, family history, immunizations andmedications were updated in the EMR today and reviewed under the history and medication portions of their EMR.     ROS: Negative, with the exception of above mentioned in HPI   Objective:  BP 128/71 (BP Location: Right Arm, Patient Position: Sitting, Cuff Size: Large)   Pulse 68   Temp 97.9 F (36.6 C)   Resp 20   Wt 273 lb 12 oz (124.2 kg)   SpO2 97%   BMI 40.43 kg/m  Body mass index is 40.43 kg/m. Gen: Afebrile. No acute distress. Nontoxic in appearance, well developed, well nourished.  HENT: AT. [place]. Bilateral TM visualized WNL. MMM, no oral lesions. Bilateral nares with erythema and drainage. Throat without erythema or exudates. Cough and hoarseness present.  Eyes:Pupils Equal Round Reactive to light, Extraocular movements intact,  Conjunctiva without redness, discharge or icterus. Neck/lymp/endocrine: Supple,no lymphadenopathy CV: RRR SM present (chronic) Chest: wheezing left lung fields. Good air movement, normal resp effort.  Abd: Soft. NTND. BS present Neuro: Normal gait. PERLA. EOMi. Alert. Oriented x3   No exam data present No results found. No results found for this or any previous visit (from the past 24 hour(s)).  Assessment/Plan: OWENN ROTHERMEL is a 72 y.o. male present for OV for  Wheezing Moderate persistent asthma without complication Cough Rest, hydrate.  mucinex (DM if cough), nettie pot or nasal saline.  Doxycycline BID prescribed, take until completed.  Prednisone burst  prescribed. Albuterol every 4-6 hours as needed--> refilled.  Restart your [facility] and take routinely as prescribed. --> refilled x1 CXR ordered, duoneb tx in office did not clear wheeze/crackle.  If cough present it can last up to 6-8 weeks.  F/U 2 weeks of not improved.     Reviewed expectations re: course of current medical issues.  Discussed self-management of symptoms.  Outlined signs and symptoms indicating need for more acute intervention.  Patient verbalized understanding and all questions were answered.  Patient received an After-Visit Summary.    No orders of the defined types were placed in this encounter.    Note is dictated utilizing voice recognition software. Although note has been proof read prior to signing, occasional typographical errors still can be missed. If any questions arise, please do not hesitate to call for verification.   electronically signed by:  Howard Pouch, DO  [place]

## 2017-05-06 NOTE — Patient Instructions (Signed)
Rest, hydrate.  mucinex (DM if cough), nettie pot or nasal saline.  Doxycycline BID prescribed, take until completed.  Albuterol every 4-6 hours as needed--> refilled.  Restart your [facility] and take routinely as prescribed. --> refilled x1 Prednisone 5 day course also prescribed.  If cough present it can last up to 6-8 weeks.  F/U 2 weeks of not improved.   Please get chest xray at [facility] today, we will call you when we get all the results.     Acute Bronchitis, Adult Acute bronchitis is when air tubes (bronchi) in the lungs suddenly get swollen. The condition can make it hard to breathe. It can also cause these symptoms:  A cough.  Coughing up clear, yellow, or green mucus.  Wheezing.  Chest congestion.  Shortness of breath.  A fever.  Body aches.  Chills.  A sore throat.  Follow these instructions at home: Medicines  Take over-the-counter and prescription medicines only as told by your doctor.  If you were prescribed an antibiotic medicine, take it as told by your doctor. Do not stop taking the antibiotic even if you start to feel better. General instructions  Rest.  Drink enough fluids to keep your pee (urine) clear or pale yellow.  Avoid smoking and secondhand smoke. If you smoke and you need help quitting, ask your doctor. Quitting will help your lungs heal faster.  Use an inhaler, cool mist vaporizer, or humidifier as told by your doctor.  Keep all follow-up visits as told by your doctor. This is important. How is this prevented? To lower your risk of getting this condition again:  Wash your hands often with soap and water. If you cannot use soap and water, use hand sanitizer.  Avoid contact with people who have cold symptoms.  Try not to touch your hands to your mouth, nose, or eyes.  Make sure to get the flu shot every year.  Contact a doctor if:  Your symptoms do not get better in 2 weeks. Get help right away if:  You cough up  blood.  You have chest pain.  You have very bad shortness of breath.  You become dehydrated.  You faint (pass out) or keep feeling like you are going to pass out.  You keep throwing up (vomiting).  You have a very bad headache.  Your fever or chills gets worse. This information is not intended to replace advice given to you by your health care provider. Make sure you discuss any questions you have with your health care provider. Document Released: 10/02/2007 Document Revised: 11/22/2015 Document Reviewed: 10/04/2015 Elsevier Interactive Patient Education  [employer].

## 2017-05-07 ENCOUNTER — Telehealth: Payer: Self-pay | Admitting: Family Medicine

## 2017-05-07 NOTE — Telephone Encounter (Signed)
Please call pt: His CXR did not show evidence of pneumonia, bronchitis features only. Continue abx until completed, and follow up in 2 weeks with PCP only if not improved or worsening.

## 2017-05-07 NOTE — Telephone Encounter (Signed)
Spoke with patient reviewed xray results and instructions. Patient verbalized understanding. 

## 2017-05-07 NOTE — Telephone Encounter (Addendum)
FYI.  SW pt and advised him that he will need to contact his insurance company to find out what they prefer and if he could afford it. Pt then asked if we could call and I advised pt that we asked that the pts call their insurance company to get this information, pt then hung up the phone.

## 2017-05-07 NOTE — Telephone Encounter (Signed)
OK. Noted. Thanks for dealing with this.

## 2017-05-09 ENCOUNTER — Other Ambulatory Visit: Payer: Self-pay

## 2017-05-09 DIAGNOSIS — E119 Type 2 diabetes mellitus without complications: Secondary | ICD-10-CM

## 2017-05-09 MED ORDER — CANAGLIFLOZIN 100 MG PO TABS: 100.0000 mg | ORAL_TABLET | Freq: Every day | ORAL | 0 refills | Status: DC

## 2017-05-09 NOTE — Telephone Encounter (Signed)
Invokana refill sent to [facility]

## 2017-05-30 ENCOUNTER — Encounter: Payer: Self-pay | Admitting: Family Medicine

## 2017-06-02 ENCOUNTER — Ambulatory Visit (INDEPENDENT_AMBULATORY_CARE_PROVIDER_SITE_OTHER): Payer: Medicare Other | Admitting: Family Medicine

## 2017-06-02 ENCOUNTER — Encounter: Payer: Self-pay | Admitting: Family Medicine

## 2017-06-02 VITALS — BP 146/73 | HR 60 | Temp 98.1°F | Resp 16 | Ht 69.0 in | Wt 276.5 lb

## 2017-06-02 DIAGNOSIS — E7800 Pure hypercholesterolemia, unspecified: Secondary | ICD-10-CM

## 2017-06-02 DIAGNOSIS — I10 Essential (primary) hypertension: Secondary | ICD-10-CM | POA: Diagnosis not present

## 2017-06-02 DIAGNOSIS — E119 Type 2 diabetes mellitus without complications: Secondary | ICD-10-CM | POA: Diagnosis not present

## 2017-06-02 LAB — BASIC METABOLIC PANEL
BUN: 19 mg/dL (ref 6–23)
CHLORIDE: 108 meq/L (ref 96–112)
CO2: 27 mEq/L (ref 19–32)
Calcium: 9.3 mg/dL (ref 8.4–10.5)
Creatinine, Ser: 0.94 mg/dL (ref 0.40–1.50)
GFR: 84.25 mL/min (ref >60.00)
Glucose, Bld: 142 mg/dL — ABNORMAL HIGH (ref 70–99)
POTASSIUM: 4.5 meq/L (ref 3.5–5.1)
SODIUM: 142 meq/L (ref 135–145)

## 2017-06-02 LAB — HEMOGLOBIN A1C: HEMOGLOBIN A1C: 7.6 % — AB (ref 4.6–6.5)

## 2017-06-02 NOTE — Progress Notes (Signed)
OFFICE VISIT  06/02/2017   CC:  Chief Complaint  Patient presents with  . Follow-up    RCI, pt is fasting.    HPI:    Patient is a 72 y.o. Caucasian male who presents for f/u DM 2, HTN, HLD. He had acute bronchitis about 1 mo ago but feeling back to baseline again.  DM 2: no home glucose checks.  Compliance with all meds except only took invokana for 1 mo due to cost. No change in diet or exercise lately.  HLD: compliant with statin.  No side effects.  ROS: no fevers, no CP, no palpitations, no polyuria or polydipsia, no feet complaints, no acute vision complaints.   Past Medical History:  Diagnosis Date  . Carotid bruit 05/28/10   Tortuosity of carotids on doppler u/s, no stenosis.  . Diabetes mellitus    type 2- non insulin-requiring  . DOE (dyspnea on exertion)    Cardiology w/u unrevealing except LVOT obstruction: suspect dyspnea due to HOCM, morbid obesity and OSA--stable as of 02/2017 cardiol f/u.  Marland Kitchen Fatty liver u/s and CT 2009   w/mildly elevated transaminases; u/s 12/2012 reconfirmed dx.  Hep B and C testing negative.  Marland Kitchen GERD (gastroesophageal reflux disease)    EDG 04-03-01, + distal esophageal stricture dilation  . Gout   . Hiatal hernia   . Hyperlipidemia   . Hypertension    severe LVH on echo  . Hypertrophic obstructive cardiomyopathy (HOCM) ([place]) 2012   LVOT obstruction stable on echo 01/09/12 and again on f/u echo 09/29/14--with evidence of HOCM due to chordal systolic anterior motion of MV leaflet.  . Moderate persistent asthma 04/19/2014  . Morbid obesity ([place])   . OSA on CPAP    Could not tolerate CPAP, not surgical candidate per Dr. Benjamine Mola.  Restarted CPAP (auto titrate 5-15 with full facemask--Dr. Alva 2017.  Getting mask fitting/leak issues worked out as of 07/2016 pulm f/u.  . Osteoarthritis, multiple sites    primarily knees and back.  . Stricture and stenosis of esophagus   . Venous insufficiency of both lower extremities     Past Surgical History:   Procedure Laterality Date  . CARDIOVASCULAR STRESS TEST  2006 and 04/27/10   Normal stress nuclear study, normal EF  . COLONOSCOPY  x 2   Dr. Sharlett Iles w/ [facility]: latest 04/2006  . NASAL SINUS SURGERY    . Sleep study  07/2016   OSA; CPAP trial at 10 cm H20 recommended by pulm (Dr. Tennis Must Dios--[place] pulm).  . [place]   ? Discectomy  . TEE WITHOUT CARDIOVERSION  05/2010   Dr. Einar Gip; severe LVH, no valvular abnormalities  . TRANSESOPHAGEAL ECHOCARDIOGRAM  06/06/10; 12/2011; 09/2014; 11/2016   LVOT obst with 135 mm Hg PG.  Not HOCM.   No mitral regurg..  Repeat echo 01/09/12 no change.  2016: EF 60%, severe conc LV hypertrophy, normal global wall motion, LA mildly dilated, systolic anterior motion of mitral valve chord, mild MR, mild TR, mild pulm HTN--no signif change since 2013.  Repeat echo 11/2016 no signif change compared to prior studies.  . TRANSTHORACIC ECHOCARDIOGRAM  05/28/10   Normal LVEF, LVOT obstruction, lateral wall hypokinesis    Outpatient Medications Prior to Visit  Medication Sig Dispense Refill  . albuterol (PROAIR HFA) 108 (90 Base) MCG/ACT inhaler INHALE 2 PUFFS INTO THE LUNGS EVERY 4 HOURS AS NEEDED FOR WHEEZING 8.5 g 0  . allopurinol (ZYLOPRIM) 300 MG tablet 1 tab po bid 180 tablet  3  . aspirin 325 MG tablet Take 325 mg by mouth daily.      . canagliflozin (INVOKANA) 100 MG TABS tablet Take 1 tablet (100 mg total) by mouth daily before breakfast. 90 tablet 0  . Cholecalciferol (VITAMIN D) 2000 UNITS tablet Take 2,000 Units by mouth daily.    Marland Kitchen eplerenone (INSPRA) 50 MG tablet Take 50 mg by mouth daily. Started 09/28/14 by Dr. Einar Gip    . fluticasone (FLONASE) 50 MCG/ACT nasal spray Place 2 sprays into both nostrils daily. 48 g 3  . glipiZIDE (GLUCOTROL XL) 10 MG 24 hr tablet Take 1 tablet (10 mg total) daily by mouth. 90 tablet 1  . glucose blood (ONE TOUCH ULTRA TEST) test strip Use as instructed to check blood sugar.  DX 250.00 100 each prn  . metFORMIN  (GLUCOPHAGE) 1000 MG tablet TAKE 1 TABLET BY MOUTH TWO  TIMES DAILY WITH MEALS 90 tablet 1  . metoprolol (LOPRESSOR) 50 MG tablet Take 1 tablet (50 mg total) by mouth 2 (two) times daily. As per Dr. Einar Gip 180 tablet 4  . mometasone-formoterol (DULERA) 200-5 MCG/ACT AERO Inhale 2 puffs into the lungs 2 (two) times daily. 1 Inhaler 0  . ONE TOUCH LANCETS MISC Use as directed to check blood sugar.  DX 250.00 200 each prn  . pantoprazole (PROTONIX) 40 MG tablet TAKE 1 TABLET BY MOUTH  DAILY 90 tablet 3  . rosuvastatin (CRESTOR) 20 MG tablet Take 1 tablet (20 mg total) by mouth at bedtime. 90 tablet 1  . verapamil (VERELAN PM) 120 MG 24 hr capsule TAKE 1 CAPSULE BY MOUTH  DAILY . 90 capsule 0  . doxycycline (VIBRA-TABS) 100 MG tablet Take 1 tablet (100 mg total) by mouth 2 (two) times daily. (Patient not taking: Reported on 06/02/2017) 20 tablet 0  . predniSONE (DELTASONE) 50 MG tablet Take 1 tablet (50 mg total) by mouth daily with breakfast. (Patient not taking: Reported on 06/02/2017) 5 tablet 0   No facility-administered medications prior to visit.     No Known Allergies  ROS As per HPI  PE: Blood pressure (!) 146/73, pulse 60, temperature 98.1 F (36.7 C), temperature source Oral, resp. rate 16, height 5\' 9"  (1.753 m), weight 276 lb 8 oz (125.4 kg), SpO2 94 %. Gen: Alert, well appearing.  Patient is oriented to person, place, time, and situation. AFFECT: pleasant, lucid thought and speech. CV: RRR, 3-4/6 syst murmur, S1 obscured, S2 distinct, no diastolic murmur.  No r/g. Chest is clear, no wheezing or rales. Normal symmetric air entry throughout both lung fields. No chest wall deformities or tenderness. EXT: 1-2 + pitting edema bilat.  LABS:  Lab Results  Component Value Date   LABURIC 5.4 11/20/2016    Lab Results  Component Value Date   TSH 3.08 04/19/2014   No results found for: WBC, HGB, HCT, MCV, PLT Lab Results  Component Value Date   CREATININE 1.04 02/28/2017   BUN 13  02/28/2017   NA 141 02/28/2017   K 4.4 02/28/2017   CL 107 02/28/2017   CO2 29 02/28/2017   Lab Results  Component Value Date   ALT 34 11/20/2016   AST 32 11/20/2016   ALKPHOS 57 11/20/2016   BILITOT 0.6 11/20/2016   Lab Results  Component Value Date   CHOL 97 02/28/2017   Lab Results  Component Value Date   HDL 35.40 (L) 02/28/2017   Lab Results  Component Value Date   LDLCALC 43 02/28/2017  Lab Results  Component Value Date   TRIG 95.0 02/28/2017   Lab Results  Component Value Date   CHOLHDL 3 02/28/2017   Lab Results  Component Value Date   PSA 0.81 02/28/2017   PSA 0.70 11/28/2015   PSA 0.71 04/19/2014   Lab Results  Component Value Date   HGBA1C 7.8 (H) 02/28/2017    IMPRESSION AND PLAN:  1) DM 2, not ideal control per last A1c. No home gluc monitoring. No invokana: cost prohibitive. Recheck A1c and reconsider diff med if A1c not 7% or better.  2) HTN: no home monitoring. Slightly up today.  Once again, encouraged home bp monitoring and call if consistently >140/90. No change in meds today.  BMET today.  3) HLD: tolerating statin. FLP 02/2017 excellent.  An After Visit Summary was printed and given to the patient.  FOLLOW UP: Return in about 3 months (around 08/30/2017) for routine chronic illness f/u.  Signed:  Crissie Sickles, MD           06/02/2017

## 2017-06-03 DIAGNOSIS — H53002 Unspecified amblyopia, left eye: Secondary | ICD-10-CM | POA: Diagnosis not present

## 2017-06-03 DIAGNOSIS — Z961 Presence of intraocular lens: Secondary | ICD-10-CM | POA: Diagnosis not present

## 2017-06-04 ENCOUNTER — Other Ambulatory Visit: Payer: Self-pay | Admitting: Family Medicine

## 2017-06-04 ENCOUNTER — Telehealth: Payer: Self-pay | Admitting: Family Medicine

## 2017-06-04 MED ORDER — CANAGLIFLOZIN 100 MG PO TABS: 100.0000 mg | ORAL_TABLET | Freq: Every day | ORAL | 1 refills | Status: DC

## 2017-06-04 NOTE — Telephone Encounter (Signed)
Yes, he may take invokana.  However, this is the med I rx'd for him last month and he said it was too costly to continue taking.  That's the whole reason we tried to change him to Victoza! Let me know.-thx

## 2017-06-04 NOTE — Telephone Encounter (Signed)
Spoke to patient's wife, she stated that the Victoza would be the same cost and he would rather try the invokana first. Can this be sent to [facility] per wife request.

## 2017-06-04 NOTE — Telephone Encounter (Signed)
OK, will send invokana to [facility].

## 2017-06-04 NOTE — Telephone Encounter (Signed)
Patient's wife Remo Lipps called back to inform that victoza is covered by the insurance. However, she said her husband asks if he takes invocana instead of victoza, will that help bring his blood sugars down? She said she is changing his diet as well. She would like a call back with an answer.

## 2017-08-09 ENCOUNTER — Other Ambulatory Visit: Payer: Self-pay | Admitting: Family Medicine

## 2017-08-11 DIAGNOSIS — G4733 Obstructive sleep apnea (adult) (pediatric): Secondary | ICD-10-CM | POA: Diagnosis not present

## 2017-09-03 ENCOUNTER — Telehealth: Payer: Self-pay | Admitting: *Deleted

## 2017-09-03 NOTE — Telephone Encounter (Signed)
Sure.  Just make sure he is aware that if the cologuard comes back positive then a colonoscopy is recommended as the next step. Pls initiate the cologuard process.  Tell me if I need to enter an order.-thx

## 2017-09-03 NOTE — Telephone Encounter (Signed)
Pt is due for colonoscopy. SW pts wife, okay per DRP. She wants to know if pt could do the cologuard. Please advise. Thanks.

## 2017-09-04 ENCOUNTER — Other Ambulatory Visit: Payer: Self-pay | Admitting: *Deleted

## 2017-09-04 DIAGNOSIS — Z1211 Encounter for screening for malignant neoplasm of colon: Secondary | ICD-10-CM

## 2017-09-04 NOTE — Telephone Encounter (Signed)
Left message on home vm that order has been done.

## 2017-09-17 DIAGNOSIS — Z1212 Encounter for screening for malignant neoplasm of rectum: Secondary | ICD-10-CM | POA: Diagnosis not present

## 2017-09-17 DIAGNOSIS — Z1211 Encounter for screening for malignant neoplasm of colon: Secondary | ICD-10-CM | POA: Diagnosis not present

## 2017-09-27 ENCOUNTER — Other Ambulatory Visit: Payer: Self-pay | Admitting: Family Medicine

## 2017-09-29 DIAGNOSIS — I10 Essential (primary) hypertension: Secondary | ICD-10-CM | POA: Diagnosis not present

## 2017-09-29 DIAGNOSIS — I491 Atrial premature depolarization: Secondary | ICD-10-CM | POA: Diagnosis not present

## 2017-09-29 DIAGNOSIS — I421 Obstructive hypertrophic cardiomyopathy: Secondary | ICD-10-CM | POA: Diagnosis not present

## 2017-10-02 ENCOUNTER — Encounter: Payer: Self-pay | Admitting: Family Medicine

## 2017-10-02 LAB — COLOGUARD
COLOGUARD: POSITIVE
Cologuard: POSITIVE

## 2017-10-03 ENCOUNTER — Encounter: Payer: Self-pay | Admitting: Internal Medicine

## 2017-10-03 ENCOUNTER — Encounter: Payer: Self-pay | Admitting: Family Medicine

## 2017-10-03 ENCOUNTER — Telehealth: Payer: Self-pay | Admitting: Family Medicine

## 2017-10-03 DIAGNOSIS — Z1211 Encounter for screening for malignant neoplasm of colon: Secondary | ICD-10-CM

## 2017-10-03 DIAGNOSIS — R195 Other fecal abnormalities: Secondary | ICD-10-CM

## 2017-10-03 NOTE — Telephone Encounter (Signed)
Pts wife advised and voiced understanding, okay per DPR. 

## 2017-10-03 NOTE — Telephone Encounter (Signed)
Pls notify pt that his colon cancer screening test came back positive. He needs a colonoscopy to get a closer look at colon. My records show he got his last colonoscopy with Dr. Sharlett Iles at [facility].  Dr. Sharlett Iles is now retired. I'll refer to [facility] and they will assign him a new GI MD.-thx

## 2017-10-09 DIAGNOSIS — I10 Essential (primary) hypertension: Secondary | ICD-10-CM | POA: Diagnosis not present

## 2017-10-24 DIAGNOSIS — L918 Other hypertrophic disorders of the skin: Secondary | ICD-10-CM | POA: Diagnosis not present

## 2017-10-24 DIAGNOSIS — R208 Other disturbances of skin sensation: Secondary | ICD-10-CM | POA: Diagnosis not present

## 2017-10-27 DIAGNOSIS — I491 Atrial premature depolarization: Secondary | ICD-10-CM | POA: Diagnosis not present

## 2017-10-27 DIAGNOSIS — I421 Obstructive hypertrophic cardiomyopathy: Secondary | ICD-10-CM | POA: Diagnosis not present

## 2017-10-27 DIAGNOSIS — I10 Essential (primary) hypertension: Secondary | ICD-10-CM | POA: Diagnosis not present

## 2017-11-05 ENCOUNTER — Encounter: Payer: Self-pay | Admitting: Family Medicine

## 2017-11-10 DIAGNOSIS — I491 Atrial premature depolarization: Secondary | ICD-10-CM | POA: Diagnosis not present

## 2017-11-12 DIAGNOSIS — I491 Atrial premature depolarization: Secondary | ICD-10-CM | POA: Diagnosis not present

## 2017-11-24 DIAGNOSIS — I10 Essential (primary) hypertension: Secondary | ICD-10-CM | POA: Diagnosis not present

## 2017-11-24 DIAGNOSIS — I421 Obstructive hypertrophic cardiomyopathy: Secondary | ICD-10-CM | POA: Diagnosis not present

## 2017-11-25 ENCOUNTER — Encounter: Payer: Self-pay | Admitting: Family Medicine

## 2017-12-05 ENCOUNTER — Encounter: Payer: Self-pay | Admitting: Internal Medicine

## 2017-12-05 ENCOUNTER — Ambulatory Visit (AMBULATORY_SURGERY_CENTER): Payer: Self-pay

## 2017-12-05 ENCOUNTER — Telehealth: Payer: Self-pay

## 2017-12-05 VITALS — Ht 69.0 in | Wt 258.8 lb

## 2017-12-05 DIAGNOSIS — R195 Other fecal abnormalities: Secondary | ICD-10-CM

## 2017-12-05 NOTE — Telephone Encounter (Signed)
Dr. Carlean Purl,  I saw Jack [name] in [facility] today. He is scheduled for a colonoscopy on 12/19/17. He is having the procedure because he had a positive cologuard. I discovered that this patient is scheduled for a Left heart Cath and coronary angiography on 01/06/18 with Dr. Einar Gip due to irregular heartbeat.  Should this patient postpone his colonoscopy until after his cardiac issues are resolved? Please advise. Thanks!   Riki Sheer, LPN  ( [facility] )

## 2017-12-05 NOTE — Telephone Encounter (Signed)
No - Dr. Einar Gip sent me a note stating that he did not have an issue with doing the colonoscopy first.  Patient had a CHF exacerbation but was not admitted and is better.  I will cc Osvaldo Angst also but it appears we should proceed and does not appear to be at greater risk of sedation from what I can tell.  I am back in the office Monday and we can sort out more if needed  Thanks!

## 2017-12-05 NOTE — Progress Notes (Signed)
Denies allergies to eggs or soy products. Denies complication of anesthesia or sedation. Denies use of weight loss medication. Denies use of O2.   Emmi instructions declined.  

## 2017-12-09 NOTE — Telephone Encounter (Signed)
Patient was called and informed that Dr. Einar Gip and Dr. Carlean Purl agreed that it was ok to proceed with the scheduled colonoscopy prior to the heart cath and coronary angiography.  Riki Sheer, LPN ( [place] )

## 2017-12-12 ENCOUNTER — Encounter: Payer: Self-pay | Admitting: Family Medicine

## 2017-12-19 ENCOUNTER — Encounter: Payer: Self-pay | Admitting: Internal Medicine

## 2017-12-19 ENCOUNTER — Ambulatory Visit (AMBULATORY_SURGERY_CENTER): Payer: Medicare Other | Admitting: Internal Medicine

## 2017-12-19 VITALS — BP 130/68 | HR 55 | Temp 99.5°F | Resp 20

## 2017-12-19 DIAGNOSIS — D124 Benign neoplasm of descending colon: Secondary | ICD-10-CM

## 2017-12-19 DIAGNOSIS — K635 Polyp of colon: Secondary | ICD-10-CM

## 2017-12-19 DIAGNOSIS — D125 Benign neoplasm of sigmoid colon: Secondary | ICD-10-CM | POA: Diagnosis not present

## 2017-12-19 DIAGNOSIS — R195 Other fecal abnormalities: Secondary | ICD-10-CM | POA: Diagnosis not present

## 2017-12-19 DIAGNOSIS — K51418 Inflammatory polyps of colon with other complication: Secondary | ICD-10-CM

## 2017-12-19 DIAGNOSIS — D120 Benign neoplasm of cecum: Secondary | ICD-10-CM

## 2017-12-19 DIAGNOSIS — Z1211 Encounter for screening for malignant neoplasm of colon: Secondary | ICD-10-CM | POA: Diagnosis not present

## 2017-12-19 MED ORDER — SODIUM CHLORIDE 0.9 % IV SOLN: 500.0000 mL | Freq: Once | INTRAVENOUS | Status: DC

## 2017-12-19 NOTE — Progress Notes (Signed)
Called to room to assist during endoscopic procedure.  Patient ID and intended procedure confirmed with present staff. Received instructions for my participation in the procedure from the performing physician.  

## 2017-12-19 NOTE — Op Note (Signed)
[facility] Patient Name: Jack [name] Procedure Date: 12/19/2017 11:41 AM MRN: [id number] Endoscopist: Gatha Mayer , MD Age: 72 Referring MD:  Date of Birth: 16-Feb-1946 Gender: Male Account #: [id number] Procedure:                Colonoscopy Indications:              Positive Cologuard test Medicines:                Propofol per Anesthesia, Monitored Anesthesia Care Procedure:                Pre-Anesthesia Assessment:                           - Prior to the procedure, a History and Physical                            was performed, and patient medications and                            allergies were reviewed. The patient's tolerance of                            previous anesthesia was also reviewed. The risks                            and benefits of the procedure and the sedation                            options and risks were discussed with the patient.                            All questions were answered, and informed consent                            was obtained. Prior Anticoagulants: The patient                            last took aspirin 1 day prior to the procedure. ASA                            Grade Assessment: III - A patient with severe                            systemic disease. After reviewing the risks and                            benefits, the patient was deemed in satisfactory                            condition to undergo the procedure.                           After obtaining informed consent, the colonoscope  was passed under direct vision. Throughout the                            procedure, the patient's blood pressure, pulse, and                            oxygen saturations were monitored continuously. The                            Colonoscope was introduced through the anus and                            advanced to the the cecum, identified by                            appendiceal orifice and  ileocecal valve. The                            colonoscopy was performed without difficulty. The                            patient tolerated the procedure well. The quality                            of the bowel preparation was excellent. The bowel                            preparation used was Miralax. The ileocecal valve,                            appendiceal orifice, and rectum were photographed. Scope In: 11:46:45 AM Scope Out: 12:21:39 PM Scope Withdrawal Time: 0 hours 32 minutes 29 seconds  Total Procedure Duration: 0 hours 34 minutes 54 seconds  Findings:                 The perianal and digital rectal examinations were                            normal. Pertinent negatives include normal prostate                            (size, shape, and consistency).                           A 20 mm polyp was found in the ileocecal valve. The                            polyp was sessile. Polyp resection was incomplete                            due to polyp size (too large to be completely                            excised). Estimated blood loss was  minimal.                           Four sessile and semi-pedunculated polyps were                            found in the sigmoid colon and descending colon.                            The polyps were 5 to 8 mm in size. These polyps                            were removed with a cold snare. Resection and                            retrieval were complete. Verification of patient                            identification for the specimen was done. Estimated                            blood loss was minimal. To prevent bleeding after                            the polypectomy, one hemostatic clip was                            successfully placed on a descending poly due to                            persistent ooze (MR conditional). There was no                            bleeding at the end of the procedure.                            Multiple diverticula were found in the sigmoid                            colon.                           Internal hemorrhoids were found.                           The exam was otherwise without abnormality on                            direct and retroflexion views. Complications:            No immediate complications. Estimated Blood Loss:     Estimated blood loss was minimal. Impression:               - One 20 mm polyp at the ileocecal valve. Partially  removed and looks like it was an enlarged and                            inflamed IC valve and not a polyp so I stopped                            resection.                           - Four 5 to 8 mm polyps in the sigmoid colon and in                            the descending colon, removed with a cold snare.                            Resected and retrieved. Clip (MR conditional) was                            placed.                           - Diverticulosis in the sigmoid colon.                           - Internal hemorrhoids.                           - The examination was otherwise normal on direct                            and retroflexion views. Recommendation:           - Patient has a contact number available for                            emergencies. The signs and symptoms of potential                            delayed complications were discussed with the                            patient. Return to normal activities tomorrow.                            Written discharge instructions were provided to the                            patient.                           - Resume previous diet.                           - Continue present medications. Stay on ASA due to  heart disease                           - Repeat colonoscopy is recommended. The                            colonoscopy date will be determined after pathology                            results from  today's exam become available for                            review. Gatha Mayer, MD 12/19/2017 12:32:12 PM This report has been signed electronically.

## 2017-12-19 NOTE — Progress Notes (Signed)
A/ox3, pleased with MAC, report to RN 

## 2017-12-19 NOTE — Patient Instructions (Signed)
YOU HAD AN ENDOSCOPIC PROCEDURE TODAY AT [facility]:   Refer to the procedure report that was given to you for any specific questions about what was found during the examination.  If the procedure report does not answer your questions, please call your gastroenterologist to clarify.  If you requested that your care partner not be given the details of your procedure findings, then the procedure report has been included in a sealed envelope for you to review at your convenience later.  YOU SHOULD EXPECT: Some feelings of bloating in the abdomen. Passage of more gas than usual.  Walking can help get rid of the air that was put into your GI tract during the procedure and reduce the bloating. If you had a lower endoscopy (such as a colonoscopy or flexible sigmoidoscopy) you may notice spotting of blood in your stool or on the toilet paper. If you underwent a bowel prep for your procedure, you may not have a normal bowel movement for a few days.  Please Note:  You might notice some irritation and congestion in your nose or some drainage.  This is from the oxygen used during your procedure.  There is no need for concern and it should clear up in a day or so.  SYMPTOMS TO REPORT IMMEDIATELY:   Following lower endoscopy (colonoscopy or flexible sigmoidoscopy):  Excessive amounts of blood in the stool  Significant tenderness or worsening of abdominal pains  Swelling of the abdomen that is new, acute  Fever of 100F or higher   Following upper endoscopy (EGD)  Vomiting of blood or coffee ground material  New chest pain or pain under the shoulder blades  Painful or persistently difficult swallowing  New shortness of breath  Fever of 100F or higher  Black, tarry-looking stools  For urgent or emergent issues, a gastroenterologist can be reached at any hour by calling [phone].   DIET:  We do recommend a small meal at first, but then you may proceed to your regular diet.  Drink  plenty of fluids but you should avoid alcoholic beverages for 24 hours.  ACTIVITY:  You should plan to take it easy for the rest of today and you should NOT DRIVE or use heavy machinery until tomorrow (because of the sedation medicines used during the test).    FOLLOW UP: Our staff will call the number listed on your records the next business day following your procedure to check on you and address any questions or concerns that you may have regarding the information given to you following your procedure. If we do not reach you, we will leave a message.  However, if you are feeling well and you are not experiencing any problems, there is no need to return our call.  We will assume that you have returned to your regular daily activities without incident.  If any biopsies were taken you will be contacted by phone or by letter within the next 1-3 weeks.  Please call us at [phone] if you have not heard about the biopsies in 3 weeks.    SIGNATURES/CONFIDENTIALITY: You and/or your care partner have signed paperwork which will be entered into your electronic medical record.  These signatures attest to the fact that that the information above on your After Visit Summary has been reviewed and is understood.  Full responsibility of the confidentiality of this discharge information lies with you and/or your care-partner.  Polyp, diverticulosis, and hemorrhoid information given.  Card given to state  location of clip placed at polypectomy sight.  Stay on Aspirin.

## 2017-12-20 ENCOUNTER — Telehealth: Payer: Self-pay | Admitting: Gastroenterology

## 2017-12-20 NOTE — Telephone Encounter (Signed)
Called by the patient's wife this evening due to concern of rectal bleeding. Call patient back and discussed his issues. He underwent a colonoscopy on August 23 with Dr. Carlean Purl.  A polypoid lesion was found that the IC valve and was partially resected.  He also had 4 polyps that were resected in the colon one requiring a hemostatic clip because of persistent issues in the descending colon. The patient has not had a bowel movement until earlier this evening.  He was in the bathroom having a bowel movement minimally straining and had a passing of gas and significant hematochezia.  It took 4-5 flushes for the amount of blood that he produced from his rectum to be able to make its way down the toilet.  He had a presyncope during this event that was witnessed by his wife with his eyes rolling back for no more than 2 to 3-5 seconds but he did not fall to the ground. He denies feeling unwell at this point (actually feels better than prior)in time less than 20 minutes after the event. He has no chest pain/chest discomfort.  He has some abdominal discomfort that is generalized in the right upper quadrant region (near his liver per his report).  His last full meal was earlier today. Could have been a vasovagal event. However, I recommended that patient go for further evaluation to the ED closest to him (they live in [place]). Because I am concerned that this amount of blood that is being described seems more than just ooze from the polyp that was clipped.  I am concerned about the possibility of the ICV polypoid lesion having oozing or bleeding - though it is hard to know. As he is feeling much better since event and it has not recurred they will consider coming in vs watching for this to occur again. They understand that no matter what if it occurs again they will need to come in for further evaluation. If they come in with recurrent GI bleeding, I would recommend that they be evaluated fully by the ED and  after initial laboratories and workup are completed, GI can be contacted. I think it would be reasonable to consider prepping the patient for a repeat colonoscopy overnight into tomorrow, and if he stops bleeding great but if he does not then we will have begun the process of getting him ready for a colonoscopy as early as Sunday afternoon. Can use GoLytely or MoviPrep. Likely observation during the morning should they come in. I have alerted the ED Triage RN as well.  Justice Britain, MD [facility] # [phone]

## 2017-12-21 NOTE — Telephone Encounter (Signed)
I called the patient today since he did not come to the ED. He states he has now had a dark brown loose stool without any blood and feels well. No other issues. I'll relay to Dr. Carlean Purl as well.  Justice Britain, MD [facility] # [phone]

## 2017-12-23 ENCOUNTER — Encounter: Payer: Self-pay | Admitting: Family Medicine

## 2017-12-24 DIAGNOSIS — R0602 Shortness of breath: Secondary | ICD-10-CM | POA: Diagnosis not present

## 2017-12-25 DIAGNOSIS — R0602 Shortness of breath: Secondary | ICD-10-CM | POA: Diagnosis present

## 2017-12-25 NOTE — H&P (Signed)
OFFICE VISIT NOTES COPIED TO EPIC FOR DOCUMENTATION  . History of Present Illness Laverda Page MD; 11/29/2017 12:31 PM) Patient words: Last O/V 10/27/2017; F/U Nuc, Echo results.  The patient is a 72 year old male who presents for a Follow-up for Shortness of breath.  Additional reasons for visit:  Follow-up for Cardiomyopathy is described as the following: Mr. Cisco Kindt is pleasant white male. 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 I had seen him on 10/27/2017, due to acute on chronic diastolic heart failure, worsening leg edema and dyspnea, he underwent repeat echocardiogram on 11/13/17 revealing normal LVEF, grade 2 diastolic dysfunction. Nuclear stress test revealed EF 40% with inferior ischemia an intermediate risk study.  His past medical history significant for hypertension, controlled diabetes mellitus, obstructive sleep apnea on CPAP, morbid obesity.  He is trying his best to lose weight, accompanied by his wife at the bedside. Endorses continued dyspnea on exertion, leg edema has improved and essentially resolved with avoidance of salt and chlorthalidone. Denies chest pain, dizziness or syncope.   Problem List/Past Medical Laverda Page, MD; 11/29/2017 12:30 PM) Morbid obesity (278.01)  Gout  Nonspecific abnormal electrocardiogram (ECG) (EKG) (794.31)  Aortic valve disorders (424.1)  Esophageal Stricture or Narrowing  Dr. Philip Aspen. Esophageal dilatation 01/2013 Shortness of breath at rest (R06.02)  Nuclear stress 04/27/10 normal perfusion. No ischemia. Normal LVEF. Echo 01/08/12: Poor echo window. Marrked LVH. Chordal SAM, LVOT obstruction. Compared 1.30.12 No change. TEE 06/06/10: Normal LVEF. Mod LVH, chordal SAM & LVOT obst with 129mm Hg PG. Not HOCM. No significant mitral  regurgitation. 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 Lexiscan stress test was performed. Exercise capacity was not assessed. Stress symptoms included dyspnea. Peak blood pressure was 150/82 mmHg. The resting and stress electrocardiogram demonstrated normal sinus rhythm, normal resting conduction, frequent multifocal PAC;s, lateral T wave inversions. Stress EKG is non diagnostic for ischemia as it is a pharmacologic stress. 2. The overall quality of the study is good. Left ventricular cavity is noted to be normal on the rest and stress studies. Gated SPECT images reveal normal myocardial thickening and wall motion. The left ventricular ejection fraction was calculated to be 40%, although visually looks normal. SPECT images demonstrate small perfusion abnormality of mild intensity in the basal inferior and mid inferior myocardial wall(s) on the stress images with mild reversibility. 3. Intermediate risk study. Echocardiogram 11/12/2017: Left ventricle cavity is normal in size. Severe concentric hypertrophy of the left ventricle. Posterior and septal walls both measure 2 cm in AP dimension. Normal global wall motion. Doppler evidence of grade II (pseudonormal) diastolic dysfunction, elevated LAP. Calculated EF 55%. Hypertrophic cardiomyopathy. Left atrial cavity is mildly dilated, measuring 4.6 cm in AP dimension. Inadequate tricuspid regurgitation jet to estimate pulmonary artery pressure. Normal right atrial pressure. No significant change compared to prior study dated 12/17/2016. TEE 06/06/10: Normal LVEF. Moderate concentric LVH, chordal SAM & LVOT obst with 124mm Hg PG. No significant mitral regurgitation. Essential hypertension, benign (I10)  Controlled type 2 diabetes mellitus without complication, without long-term current use of insulin (E11.9)  Obstructive sleep apnea (G47.33)  Sleep study12/22/2011 moderate to severe obstructive sleep apnea.  OV Note-Dr Kara Mead 01/24/2016: Established for sleep management. Hyperlipidemia, mild (E78.5)  Bilateral carotid bruits (R09.89)  Carotid artery duplex 10/11/2013: No evidence of hemodynamically  significant stenosis in the bilateral carotid bifurcation vessels, Tortuosity may be the source of bruit. No significant change from 05/28/2010. 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 07/03/2016: Cholesterol 119, triglycerides 71, HDL 47, LDL 58. Creatinine 1.0, potassium 4.4, CMP otherwise normal. Hemoglobin 12.9, hematocrit normal at 39.7, MCH slightly low at 26.3, CBC otherwise normal. alkaline phosphatase mildly elevated at 125. Labs 06/02/2017: Potassium 4.5, creatinine 0.94, BMP otherwise normal. Hemoglobin A1c 7.6%. Labs 02/28/2017: Cholesterol 97, triglycerides 95, HDL 35, LDL 43.  Allergies (April Garrison; December 21, 2017 12:06 PM) No Known Drug Allergies [02/11/2012]:  Family History (April Louretta Shorten; Dec 21, 2017 12:06 PM) Father died at age 79 of Alzheimer's. Mother died at age 14-Diabetes. 3 sisters-1 sister had breast cancer.   Social History (April Garrison; 12-21-2017 12:06 PM) Current tobacco use  Former smoker. Quit 2000 Alcohol Use  Occasional alcohol use. Marital status  Married. Number of Children  1. Living Situation  Lives with spouse.  Past Surgical History (April Garrison; 12/21/2017 12:06 PM) Back Surgery [1984]: Shoulder Surgery [1999]: Left. Knee Surgery [2000]: Left.  Medication History Laverda Page, MD; 11/29/2017 12:18 PM) Metoprolol Tartrate (50MG  Tablet, 1 Tablet Tablet Oral two times daily, Taken starting 09/29/2017) Active. Eplerenone (50MG  Tablet, 1 (one) Tablet Tablet Oral every morning, Taken starting 09/29/2017) Active. Chlorthalidone (25MG  Tablet, 1  (one) Tablet Tablet Oral daily in the morning, Taken starting 09/29/2017) Active. Verapamil HCl ER (120MG  Capsule ER 24HR, 1 (one) Capsule ER 24HR Capsul Oral daily, Taken starting 04/11/2014) Active. Aspirin (81MG  Tablet, 1 Oral daily) Active. MetFORMIN HCl (1000MG  Tablet, 1 Oral two times daily) Active. Allopurinol (300MG  Tablet, 1 Oral daily) Active. GlipiZIDE ER (10MG  Tablet ER 24HR, 1 Oral daily) Active. Pantoprazole Sodium (40MG  Tablet DR, 1 Oral every other day) Active. Vitamin D (2000UNIT Tablet, 1 Oral daily) Active. ProAir HFA (108 (90 Base)MCG/ACT Aerosol Soln, Inhalation as needed) Active. Invokana (100MG  Tablet, 1 Oral daily) Active. Stool Softener (100MG  Tablet, 1 Oral every other day) Active. Medications Reconciled (Pt brought list)  Diagnostic Studies History (April Garrison; 2017-12-21 12:09 PM) Echocardiogram  11/12/2017: Left ventricle cavity is normal in size. Severe concentric hypertrophy of the left ventricle. Posterior and septal walls both measure 2 cm in AP dimension. Normal global wall motion. Doppler evidence of grade II (pseudonormal) diastolic dysfunction, elevated LAP. Calculated EF 55%. Hypertrophic cardiomyopathy. Left atrial cavity is mildly dilated, measuring 4.6 cm in AP dimension. Inadequate tricuspid regurgitation jet to estimate pulmonary artery pressure. Normal right atrial pressure. No significant change compared to prior study dated 12/17/2016. Nuclear stress test  11/10/2017: 1. Lexiscan stress test was performed. Exercise capacity was not assessed. Stress symptoms included dyspnea. Peak blood pressure was 150/82 mmHg. The resting and stress electrocardiogram demonstrated normal sinus rhythm, normal resting conduction, frequent multifocal PAC;s, lateral T wave inversions. Stress EKG is non diagnostic for ischemia as it is a pharmacologic stress. 2. The overall quality of the study is good. Left ventricular cavity is noted to be normal on the  rest and stress studies. Gated SPECT images reveal normal myocardial thickening and wall motion. The left ventricular ejection fraction was calculated to be 40%, although visually looks normal. SPECT images demonstrate small perfusion abnormality of mild intensity in the basal inferior and mid inferior myocardial wall(s) on the stress images with mild reversibility. 3. Intermediate risk study. Carotid Doppler [10/11/2013]: :  No evidence of hemodynamically significant stenosis in the bilateral carotid bifurcation vessels, Tortuosity may be the source of bruit. No significant change from 05/28/2010. TEE 06/06/10: Normal LVEF. Mod LVH, chordal SAM & LVOT obst with 171mm Hg PG. Not HOCM. No significant mitral regurgitation.     Review of Systems Laverda Page, MD; 11/29/2017 12:28 PM) General Present- Night Sweats (unchanged). Not Present- Fatigue, Fever and Unable to Sleep Lying Flat. Skin Not Present- Itching and Rash. HEENT Not Present- Headache. Respiratory Present- Difficulty Breathing on Exertion (mild and stable) and Snoring (has severe sleep apnea but does not want to use CPAP.). Cardiovascular Present- Edema (stable). Not Present- Claudications, Fainting, Orthopnea and Swelling of Extremities. Gastrointestinal Not Present- Abdominal Pain, Constipation, Diarrhea, Nausea and Vomiting. Musculoskeletal Present- Backache and Joint Pain (back and knee and occasional gout in the foot). Neurological Not Present- Headaches. Hematology Not Present- Blood Clots, Easy Bruising and Nose Bleed. All other systems negative  Vitals (April Garrison; 11/24/2017 12:25 PM) 11/24/2017 12:16 PM Weight: 259.38 lb Height: 68in Body Surface Area: 2.28 m Body Mass Index: 39.44 kg/m  Pulse: 61 (Regular)  P.OX: 96% (Room air) BP: 112/62 (Sitting, Left Arm, Standard)  Physical Exam Laverda Page, MD; 11/29/2017 12:28 PM) General Mental Status-Alert. General Appearance-Cooperative, Appears  stated age, Not in acute distress. Orientation-Oriented X3. Build & Nutrition-Well built and Morbidly obese.  Head and Neck Neck Global Assessment - full range of motion(short neck). Thyroid Gland Characteristics - no palpable nodules, no palpable enlargement.  Chest and Lung Exam Palpation Tender - No chest wall tenderness. Auscultation Breath sounds - Clear.  Cardiovascular Inspection Jugular vein - Right - No Distention. Auscultation Heart Sounds - S1 WNL, S2 WNL and No gallop present. Murmurs & Other Heart Sounds: Murmur - Location - Aortic Area and Apex. Timing - Mid-systolic. Grade - III/VI. Modifiers - Decreased w/ squatting.  Abdomen Inspection Contour - Obese and Pannus present. Palpation/Percussion Normal exam - Non Tender and No hepatosplenomegaly. Auscultation Normal exam - Bowel sounds normal.  Peripheral Vascular Lower Extremity Inspection - Left - No Pigmentation, No Varicose veins. Right - No Pigmentation, No Varicose veins. Palpation - Edema - Left - Trace edema. Right - 2+ Pitting edema. Femoral pulse - Left - Normal. Right - Normal. Popliteal pulse - Left - Normal. Right - Normal. Dorsalis pedis pulse - Left - Normal. Right - Normal. Posterior tibial pulse - Left - Normal. Right - Normal. Carotid arteries - Bilateral-Soft Bruit. Abdomen-No prominent abdominal aortic pulsation, No epigastric bruit.  Neurologic Motor-Grossly intact without any focal deficits.  Musculoskeletal Global Assessment Left Lower Extremity - normal range of motion without pain. Right Lower Extremity - normal range of motion without pain.    Assessment & Plan Laverda Page MD; 11/29/2017 12:31 PM) Shortness of breath Essential hypertension, benign (I10) Impression: EKG- 09/29/2017 - Sinus rhythm at 70 bpm with left atrial abnormality, frequent PACs and frequent nonconducted PAC, LVH with repolarization abnormality, nonspecific ST-T wave abnormality. Compared to  EKG 11/18/2016, PAC are new. 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 Lexiscan stress test was performed. Exercise capacity was not assessed. Stress symptoms included dyspnea. Peak blood pressure was 150/82 mmHg. The resting and stress electrocardiogram demonstrated normal sinus rhythm, normal resting conduction, frequent multifocal PAC;s, lateral T wave inversions. Stress EKG is non diagnostic for ischemia as it is a pharmacologic stress. 2. The overall quality of the study is good. Left ventricular cavity is noted to be normal on the  rest and stress studies. Gated SPECT images reveal normal myocardial thickening and wall motion. The left ventricular ejection fraction was calculated to be 40%, although visually looks normal. SPECT images demonstrate small perfusion abnormality of mild intensity in the basal inferior and mid inferior myocardial wall(s) on the stress images with mild reversibility. 3. Intermediate risk study.  Echocardiogram 11/12/2017: Left ventricle cavity is normal in size. Severe concentric hypertrophy of the left ventricle. Posterior and septal walls both measure 2 cm in AP dimension. Normal global wall motion. Doppler evidence of grade II (pseudonormal) diastolic dysfunction, elevated LAP. Calculated EF 55%. Hypertrophic cardiomyopathy. Left atrial cavity is mildly dilated, measuring 4.6 cm in AP dimension. Inadequate tricuspid regurgitation jet to estimate pulmonary artery pressure. Normal right atrial pressure. No significant change compared to prior study dated 12/17/2016.  TEE 06/06/10: Normal LVEF. Moderate concentric LVH, chordal SAM & LVOT obst with 137mm Hg PG. No significant mitral regurgitation. Morbid obesity with BMI of 40.0-44.9, adult (E66.01) Obstructive sleep apnea (G47.33) Story: Sleep study12/22/2011 moderate to severe obstructive sleep apnea. Follows -Dr Kara Mead 01/24/2016. Controlled type 2  diabetes mellitus without complication, without long-term current use of insulin (E11.9) Laboratory examination (Z01.89) Story: Labs 10/09/9017: Serum glucose 221, BUN 24, creatinine 1.1, potassium 4.8.  07/03/2016: Cholesterol 119, triglycerides 71, HDL 47, LDL 58. Creatinine 1.0, potassium 4.4, CMP otherwise normal. Hemoglobin 12.9, hematocrit normal at 39.7, MCH slightly low at 26.3, CBC otherwise normal. alkaline phosphatase mildly elevated at 125.  Labs 06/02/2017: Potassium 4.5, creatinine 0.94, BMP otherwise normal. Hemoglobin A1c 7.6%.  Labs 02/28/2017: Cholesterol 97, triglycerides 95, HDL 35, LDL 43.  Note:-  Recommendations:  Mr. Scotland Korver is pleasant white male. 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 I had seen him on 10/27/2017, due to acute on chronic diastolic heart failure, worsening leg edema and dyspnea, he underwent repeat echocardiogram on 11/13/17 revealing normal LVEF, grade 2 diastolic dysfunction. Nuclear stress test revealed EF 40% with inferior ischemia an intermediate risk study.  His past medical history significant for hypertension, controlled diabetes mellitus, obstructive sleep apnea on CPAP, morbid obesity.  Due to ongoing cardiac vascular risk factors, I recommended that he proceed with cardiac catheterization for definitive diagnosis of CAD as he had acute on chronic diastolic heart failure recently. He has been scheduled for colonoscopy, I do not see any contraindication for the same and although I have scheduled for coronary angiography on refer him to proceed with GI evaluation prior to coronary angiography just in case he needs angioplasty and need for anticoagulation and or antiplatelet therapy.  CC: Dr. Shawnie Dapper. (PCP); Silvano Rusk, MD (GI)    Signed by Laverda Page,  MD (11/29/2017 12:32 PM)

## 2017-12-27 ENCOUNTER — Encounter: Payer: Self-pay | Admitting: Internal Medicine

## 2017-12-27 DIAGNOSIS — Z860101 Personal history of adenomatous and serrated colon polyps: Secondary | ICD-10-CM

## 2017-12-27 DIAGNOSIS — Z86010 Personal history of colonic polyps: Secondary | ICD-10-CM

## 2017-12-27 HISTORY — DX: Personal history of colonic polyps: Z86.010

## 2017-12-27 HISTORY — DX: Personal history of adenomatous and serrated colon polyps: Z86.0101

## 2017-12-27 NOTE — Progress Notes (Signed)
4 adenomas Inflammatory changes IC valve Recall 3 yrs Message left on phone re: letter coming

## 2017-12-31 ENCOUNTER — Other Ambulatory Visit: Payer: Self-pay | Admitting: Family Medicine

## 2017-12-31 NOTE — Telephone Encounter (Signed)
Advised pts wife that pt is due for f/u [facility], she voiced understanding, okay per DPR.   Apt made for 03/06/18 at 10:00am, pt has apt w/ [facility] at 9:00am same day.

## 2018-01-06 ENCOUNTER — Encounter (HOSPITAL_COMMUNITY): Payer: Self-pay | Admitting: Cardiology

## 2018-01-06 ENCOUNTER — Ambulatory Visit (HOSPITAL_COMMUNITY)
Admission: RE | Admit: 2018-01-06 | Discharge: 2018-01-06 | Disposition: A | Discharge status: Home / Self Care | Payer: Medicare Other | Source: Ambulatory Visit | Attending: Cardiology | Admitting: Cardiology

## 2018-01-06 ENCOUNTER — Other Ambulatory Visit: Payer: Self-pay

## 2018-01-06 ENCOUNTER — Encounter (HOSPITAL_COMMUNITY)
Admission: RE | Disposition: A | Discharge status: Home / Self Care | Payer: Self-pay | Source: Ambulatory Visit | Attending: Cardiology

## 2018-01-06 DIAGNOSIS — Z87891 Personal history of nicotine dependence: Secondary | ICD-10-CM | POA: Diagnosis not present

## 2018-01-06 DIAGNOSIS — I5033 Acute on chronic diastolic (congestive) heart failure: Secondary | ICD-10-CM | POA: Diagnosis not present

## 2018-01-06 DIAGNOSIS — I110 Hypertensive heart disease with heart failure: Secondary | ICD-10-CM | POA: Diagnosis not present

## 2018-01-06 DIAGNOSIS — I421 Obstructive hypertrophic cardiomyopathy: Secondary | ICD-10-CM | POA: Diagnosis not present

## 2018-01-06 DIAGNOSIS — E7800 Pure hypercholesterolemia, unspecified: Secondary | ICD-10-CM | POA: Diagnosis not present

## 2018-01-06 DIAGNOSIS — Z7984 Long term (current) use of oral hypoglycemic drugs: Secondary | ICD-10-CM | POA: Insufficient documentation

## 2018-01-06 DIAGNOSIS — Z7982 Long term (current) use of aspirin: Secondary | ICD-10-CM | POA: Diagnosis not present

## 2018-01-06 DIAGNOSIS — I2510 Atherosclerotic heart disease of native coronary artery without angina pectoris: Secondary | ICD-10-CM | POA: Diagnosis not present

## 2018-01-06 DIAGNOSIS — G4733 Obstructive sleep apnea (adult) (pediatric): Secondary | ICD-10-CM | POA: Diagnosis not present

## 2018-01-06 DIAGNOSIS — R0602 Shortness of breath: Secondary | ICD-10-CM | POA: Diagnosis present

## 2018-01-06 DIAGNOSIS — E119 Type 2 diabetes mellitus without complications: Secondary | ICD-10-CM | POA: Insufficient documentation

## 2018-01-06 DIAGNOSIS — Z6841 Body Mass Index (BMI) 40.0 and over, adult: Secondary | ICD-10-CM | POA: Diagnosis not present

## 2018-01-06 DIAGNOSIS — M109 Gout, unspecified: Secondary | ICD-10-CM | POA: Insufficient documentation

## 2018-01-06 HISTORY — PX: LEFT HEART CATH AND CORONARY ANGIOGRAPHY: CATH118249

## 2018-01-06 LAB — GLUCOSE, CAPILLARY
GLUCOSE-CAPILLARY: 102 mg/dL — AB (ref 70–99)
Glucose-Capillary: 143 mg/dL — ABNORMAL HIGH (ref 70–99)

## 2018-01-06 SURGERY — LEFT HEART CATH AND CORONARY ANGIOGRAPHY
Anesthesia: LOCAL

## 2018-01-06 MED ORDER — ONDANSETRON HCL 4 MG/2ML IJ SOLN: 4.0000 mg | Freq: Four times a day (QID) | INTRAMUSCULAR | Status: DC | PRN

## 2018-01-06 MED ORDER — ASPIRIN 81 MG PO CHEW: 81.0000 mg | CHEWABLE_TABLET | ORAL | Status: DC

## 2018-01-06 MED ORDER — SODIUM CHLORIDE 0.9 % IV SOLN: 250.0000 mL | INTRAVENOUS | Status: DC | PRN

## 2018-01-06 MED ORDER — SODIUM CHLORIDE 0.9 % WEIGHT BASED INFUSION: 1.0000 mL/kg/h | INTRAVENOUS | Status: DC

## 2018-01-06 MED ORDER — SODIUM CHLORIDE 0.9% FLUSH: 3.0000 mL | Freq: Two times a day (BID) | INTRAVENOUS | Status: DC

## 2018-01-06 MED ORDER — LIDOCAINE HCL (PF) 1 % IJ SOLN
INTRAMUSCULAR | Status: AC
  Filled 2018-01-06: qty 30

## 2018-01-06 MED ORDER — SODIUM CHLORIDE 0.9% FLUSH: 3.0000 mL | INTRAVENOUS | Status: DC | PRN

## 2018-01-06 MED ORDER — HEPARIN (PORCINE) IN NACL 1000-0.9 UT/500ML-% IV SOLN
INTRAVENOUS | Status: AC
  Filled 2018-01-06: qty 1000

## 2018-01-06 MED ORDER — HYDROMORPHONE HCL 1 MG/ML IJ SOLN
INTRAMUSCULAR | Status: AC
  Filled 2018-01-06: qty 0.5

## 2018-01-06 MED ORDER — HEPARIN SODIUM (PORCINE) 1000 UNIT/ML IJ SOLN
INTRAMUSCULAR | Status: AC
  Filled 2018-01-06: qty 1

## 2018-01-06 MED ORDER — HEPARIN (PORCINE) IN NACL 1000-0.9 UT/500ML-% IV SOLN
INTRAVENOUS | Status: DC | PRN
  Administered 2018-01-06 (×2): 500 mL

## 2018-01-06 MED ORDER — MIDAZOLAM HCL 2 MG/2ML IJ SOLN
INTRAMUSCULAR | Status: DC | PRN
  Administered 2018-01-06: 2 mg via INTRAVENOUS

## 2018-01-06 MED ORDER — SODIUM CHLORIDE 0.9 % WEIGHT BASED INFUSION
3.0000 mL/kg/h | INTRAVENOUS | Status: AC
  Administered 2018-01-06: 3 mL/kg/h via INTRAVENOUS

## 2018-01-06 MED ORDER — HYDROMORPHONE HCL 1 MG/ML IJ SOLN
INTRAMUSCULAR | Status: DC | PRN
  Administered 2018-01-06: 0.5 mg via INTRAVENOUS

## 2018-01-06 MED ORDER — HEPARIN SODIUM (PORCINE) 1000 UNIT/ML IJ SOLN
INTRAMUSCULAR | Status: DC | PRN
  Administered 2018-01-06: 6000 [IU] via INTRAVENOUS

## 2018-01-06 MED ORDER — IOHEXOL 350 MG/ML SOLN
INTRAVENOUS | Status: DC | PRN
  Administered 2018-01-06: 80 mL via INTRACARDIAC

## 2018-01-06 MED ORDER — LIDOCAINE HCL (PF) 1 % IJ SOLN
INTRAMUSCULAR | Status: DC | PRN
  Administered 2018-01-06: 2 mL

## 2018-01-06 MED ORDER — VERAPAMIL HCL 2.5 MG/ML IV SOLN
INTRAVENOUS | Status: AC
  Filled 2018-01-06: qty 2

## 2018-01-06 MED ORDER — MIDAZOLAM HCL 2 MG/2ML IJ SOLN
INTRAMUSCULAR | Status: AC
  Filled 2018-01-06: qty 2

## 2018-01-06 MED ORDER — ACETAMINOPHEN 325 MG PO TABS: 650.0000 mg | ORAL_TABLET | ORAL | Status: DC | PRN

## 2018-01-06 MED ORDER — VERAPAMIL HCL 2.5 MG/ML IV SOLN
INTRA_ARTERIAL | Status: DC | PRN
  Administered 2018-01-06: 5 mL via INTRA_ARTERIAL

## 2018-01-06 MED ORDER — METFORMIN HCL 1000 MG PO TABS: 1000.0000 mg | ORAL_TABLET | Freq: Two times a day (BID) | ORAL | Status: DC

## 2018-01-06 SURGICAL SUPPLY — 9 items
CATH OPTITORQUE TIG 4.0 5F (CATHETERS) ×1 IMPLANT
DEVICE RAD COMP TR BAND LRG (VASCULAR PRODUCTS) ×1 IMPLANT
GLIDESHEATH SLEND A-KIT 6F 20G (SHEATH) ×1 IMPLANT
GUIDEWIRE INQWIRE 1.5J.035X260 (WIRE) IMPLANT
INQWIRE 1.5J .035X260CM (WIRE) ×2
KIT HEART LEFT (KITS) ×2 IMPLANT
PACK CARDIAC CATHETERIZATION (CUSTOM PROCEDURE TRAY) ×2 IMPLANT
TRANSDUCER W/STOPCOCK (MISCELLANEOUS) ×2 IMPLANT
TUBING CIL FLEX 10 FLL-RA (TUBING) ×2 IMPLANT

## 2018-01-06 NOTE — Discharge Instructions (Signed)
Drink plenty of fluids over next 48 hours and keep right wrist elevated at heart level for 24 hours ° °Radial Site Care °Refer to this sheet in the next few weeks. These instructions provide you with information about caring for yourself after your procedure. Your health care provider may also give you more specific instructions. Your treatment has been planned according to current medical practices, but problems sometimes occur. Call your health care provider if you have any problems or questions after your procedure. °What can I expect after the procedure? °After your procedure, it is typical to have the following: °· Bruising at the radial site that usually fades within 1-2 weeks. °· Blood collecting in the tissue (hematoma) that may be painful to the touch. It should usually decrease in size and tenderness within 1-2 weeks. ° °Follow these instructions at home: °· Take medicines only as directed by your health care provider. °· You may shower 24-48 hours after the procedure or as directed by your health care provider. Remove the bandage (dressing) and gently wash the site with plain soap and water. Pat the area dry with a clean towel. Do not rub the site, because this may cause bleeding. °· Do not take baths, swim, or use a hot tub until your health care provider approves. °· Check your insertion site every day for redness, swelling, or drainage. °· Do not apply powder or lotion to the site. °· Do not flex or bend the affected arm for 24 hours or as directed by your health care provider. °· Do not push or pull heavy objects with the affected arm for 24 hours or as directed by your health care provider. °· Do not lift over 10 lb (4.5 kg) for 5 days after your procedure or as directed by your health care provider. °· Ask your health care provider when it is okay to: °? Return to work or school. °? Resume usual physical activities or sports. °? Resume sexual activity. °· Do not drive home if you are discharged the  same day as the procedure. Have someone else drive you. °· You may drive 24 hours after the procedure unless otherwise instructed by your health care provider. °· Do not operate machinery or power tools for 24 hours after the procedure. °· If your procedure was done as an outpatient procedure, which means that you went home the same day as your procedure, a responsible adult should be with you for the first 24 hours after you arrive home. °· Keep all follow-up visits as directed by your health care provider. This is important. °Contact a health care provider if: °· You have a fever. °· You have chills. °· You have increased bleeding from the radial site. Hold pressure on the site. °Get help right away if: °· You have unusual pain at the radial site. °· You have redness, warmth, or swelling at the radial site. °· You have drainage (other than a small amount of blood on the dressing) from the radial site. °· The radial site is bleeding, and the bleeding does not stop after 30 minutes of holding steady pressure on the site. °· Your arm or hand becomes pale, cool, tingly, or numb. °This information is not intended to replace advice given to you by your health care provider. Make sure you discuss any questions you have with your health care provider. °Document Released: 05/18/2010 Document Revised: 09/21/2015 Document Reviewed: 11/01/2013 °Elsevier Interactive Patient Education © 2018 [employer] ° °

## 2018-01-06 NOTE — Interval H&P Note (Signed)
History and Physical Interval Note:  01/06/2018 12:59 PM  Jack [name]  has presented today for surgery, with the diagnosis of ST  The various methods of treatment have been discussed with the patient and family. After consideration of risks, benefits and other options for treatment, the patient has consented to  Procedure(s): LEFT HEART CATH AND CORONARY ANGIOGRAPHY (N/A) and possible angioplasty as a surgical intervention .  The patient's history has been reviewed, patient examined, no change in status, stable for surgery.  I have reviewed the patient's chart and labs.  Questions were answered to the patient's satisfaction.    Cath Lab Visit (complete for each Cath Lab visit)  Clinical Evaluation Leading to the Procedure:   ACS: No.  Non-ACS:    Anginal Classification: CCS III  Anti-ischemic medical therapy: Maximal Therapy (2 or more classes of medications)  Non-Invasive Test Results: Intermediate-risk stress test findings: cardiac mortality 1-3%/year  Prior CABG: No previous CABG        Jack [name]

## 2018-01-09 ENCOUNTER — Other Ambulatory Visit: Payer: Self-pay | Admitting: Family Medicine

## 2018-01-09 MED ORDER — VERAPAMIL HCL ER 120 MG PO CP24: 120.0000 mg | ORAL_CAPSULE | Freq: Every day | ORAL | 0 refills | Status: DC

## 2018-01-22 DIAGNOSIS — R0602 Shortness of breath: Secondary | ICD-10-CM | POA: Diagnosis not present

## 2018-01-22 DIAGNOSIS — I421 Obstructive hypertrophic cardiomyopathy: Secondary | ICD-10-CM | POA: Diagnosis not present

## 2018-01-22 DIAGNOSIS — I10 Essential (primary) hypertension: Secondary | ICD-10-CM | POA: Diagnosis not present

## 2018-01-30 ENCOUNTER — Encounter: Payer: Self-pay | Admitting: Family Medicine

## 2018-02-25 DIAGNOSIS — M9903 Segmental and somatic dysfunction of lumbar region: Secondary | ICD-10-CM | POA: Diagnosis not present

## 2018-02-25 DIAGNOSIS — M5136 Other intervertebral disc degeneration, lumbar region: Secondary | ICD-10-CM | POA: Diagnosis not present

## 2018-02-26 DIAGNOSIS — M9903 Segmental and somatic dysfunction of lumbar region: Secondary | ICD-10-CM | POA: Diagnosis not present

## 2018-02-26 DIAGNOSIS — M5136 Other intervertebral disc degeneration, lumbar region: Secondary | ICD-10-CM | POA: Diagnosis not present

## 2018-03-04 DIAGNOSIS — M5136 Other intervertebral disc degeneration, lumbar region: Secondary | ICD-10-CM | POA: Diagnosis not present

## 2018-03-04 DIAGNOSIS — M9903 Segmental and somatic dysfunction of lumbar region: Secondary | ICD-10-CM | POA: Diagnosis not present

## 2018-03-05 DIAGNOSIS — M9903 Segmental and somatic dysfunction of lumbar region: Secondary | ICD-10-CM | POA: Diagnosis not present

## 2018-03-05 DIAGNOSIS — M5136 Other intervertebral disc degeneration, lumbar region: Secondary | ICD-10-CM | POA: Diagnosis not present

## 2018-03-05 NOTE — Progress Notes (Signed)
Subjective:   Jack [name] is a 72 y.o. male who presents for Medicare Annual/Subsequent preventive examination.  Review of Systems:  No ROS.  Medicare Wellness Visit. Additional risk factors are reflected in the social history.  Cardiac Risk Factors include: advanced age (>36men, >15 women);diabetes mellitus;dyslipidemia;family history of premature cardiovascular disease;obesity (BMI >30kg/m2);sedentary lifestyle;hypertension;male gender   Sleep patterns: Sleeps 4 hours, uses CPAP. Home Safety/Smoke Alarms: Feels safe in home. Smoke alarms in place.  Living environment; residence and Firearm Safety: Lives with wife in 1 story home. Two steps at door, no rail.  Seat Belt Safety/Bike Helmet: Wears seat belt.   Male:   CCS-Colonoscopy 12/19/2017, polyp. Recall 3 years.    PSA-  Lab Results  Component Value Date   PSA 0.81 02/28/2017   PSA 0.70 11/28/2015   PSA 0.71 04/19/2014       Objective:    Vitals: BP 132/70 (BP Location: Right Arm, Patient Position: Sitting, Cuff Size: Normal)   Pulse 63   Resp 18   Ht 5\' 9"  (1.753 m)   Wt 263 lb (119.3 kg)   SpO2 96%   BMI 38.84 kg/m   Body mass index is 38.84 kg/m.  Advanced Directives 03/06/2018 01/06/2018 02/28/2017  Does Patient Have a Medical Advance Directive? Yes Yes Yes  Type of Advance Directive Living will;Healthcare Power of [place];Living will [place];Living will  Does patient want to make changes to medical advance directive? - No - Patient declined -  Copy of [place] in Chart? No - copy requested Yes No - copy requested    Tobacco Social History   Tobacco Use  Smoking Status Former Smoker  . Packs/day: 0.50  . Years: 40.00  . Pack years: 20.00  . Types: Cigarettes, Cigars  . Last attempt to quit: 04/29/1998  . Years since quitting: 19.8  Smokeless Tobacco Never Used  Tobacco Comment   4 cigars     Counseling given: Not  Answered Comment: 4 cigars   Past Medical History:  Diagnosis Date  . Allergy   . Carotid bruit 05/28/10   Tortuosity of carotids on doppler u/s, no stenosis.  . Cataract   . Chronic diastolic heart failure (HCC)    due to HOCM  . Colon cancer screening    cologuard POSITIVE 09/2017-->colonoscopy 12/19/17; multiple adenomatous polyps, recall 3 yrs.  Marland Kitchen COPD (chronic obstructive pulmonary disease) ([place])   . Diabetes mellitus    type 2- non insulin-requiring  . DOE (dyspnea on exertion)    Cardiology w/u unrevealing except LVOT obstruction: suspect dyspnea due to HOCM, morbid obesity and OSA--stable as of 02/2017 cardiol f/u.  Marland Kitchen Fatty liver u/s and CT 2009   w/mildly elevated transaminases; u/s 12/2012 reconfirmed dx.  Hep B and C testing negative.  Marland Kitchen GERD (gastroesophageal reflux disease)    EDG 04-03-01, + distal esophageal stricture dilation  . Gout   . Heart murmur   . Hiatal hernia   . Hx of adenomatous colonic polyps 12/27/2017  . Hyperlipidemia   . Hypertension    severe LVH on echo  . Hypertrophic obstructive cardiomyopathy (HOCM) ([place]) 2012   LVOT obstruction stable on echo 01/09/12 and again on f/u echo 09/29/14--with evidence of HOCM due to chordal systolic anterior motion of MV leaflet.  ECHO and myoc perf imaging planned as of 10/27/17 cards o/v  . Moderate persistent asthma 04/19/2014  . Morbid obesity ([place])   . OSA on CPAP  Could not tolerate CPAP, not surgical candidate per Dr. Benjamine Mola.  Restarted CPAP (auto titrate 5-15 with full facemask--Dr. Alva 2017.  Getting mask fitting/leak issues worked out as of 07/2016 pulm f/u.  . Osteoarthritis, multiple sites    primarily knees and back.  . Stricture and stenosis of esophagus   . Venous insufficiency of both lower extremities    Past Surgical History:  Procedure Laterality Date  . CARDIOVASCULAR STRESS TEST  2006; 04/27/10; 10/2017   Normal stress nuclear study, normal EF.  2019: EF 40%, inferior ischemia--intermediate risk  study-->Dr. Einar Gip to do cath.  . COLONOSCOPY  x 3   Most recent-->12/19/17 (after + cologuard) multiple adenomatous polyps-->recall 3 yrs.  Marland Kitchen LEFT HEART CATH AND CORONARY ANGIOGRAPHY N/A 01/06/2018   One area of 80% ostial stenosis--small and hard to visualize. No intervention.  DAPT for >1 yr recommended.  Procedure: LEFT HEART CATH AND CORONARY ANGIOGRAPHY;  Surgeon: Adrian Prows, MD;  Location: [facility];  Service: Cardiovascular;  Laterality: N/A;  . NASAL SINUS SURGERY    . Sleep study  07/2016   OSA; CPAP trial at 10 cm H20 recommended by pulm (Dr. Tennis Must Dios--[place] pulm).  . [place]   ? Discectomy  . TEE WITHOUT CARDIOVERSION  05/2010   Dr. Einar Gip; severe LVH, no valvular abnormalities  . TRANSESOPHAGEAL ECHOCARDIOGRAM  06/06/10; 12/2011; 09/2014; 11/2016   LVOT obst with 135 mm Hg PG.  Not HOCM.   No mitral regurg..  Repeat echo 01/09/12 no change.  2016: EF 60%, severe conc LV hypertrophy, normal global wall motion, LA mildly dilated, systolic anterior motion of mitral valve chord, mild MR, mild TR, mild pulm HTN--no signif change since 2013.  Repeat echo 11/2016 no signif change compared to prior studies.  . TRANSTHORACIC ECHOCARDIOGRAM  05/28/2010; 10/2017   2012; Normal LVEF, LVOT obstruction, lateral wall hypokinesis.  10/2017: EF 55%, hypertrophic CM, grd II dd, normal global wall motion.   Family History  Problem Relation Age of Onset  . Diabetes Mother   . Prostate cancer Father        Prostate  . Breast cancer Sister   . Diabetes Maternal Aunt   . Breast cancer Maternal Aunt   . Hypertension Neg Hx   . Coronary artery disease Neg Hx   . Colon cancer Neg Hx   . Stomach cancer Neg Hx   . Rectal cancer Neg Hx   . Esophageal cancer Neg Hx    Social History   Socioeconomic History  . Marital status: Married    Spouse name: Not on file  . Number of children: 1  . Years of education: Not on file  . Highest education level: Not on file  Occupational History  .  Occupation: retired    Fish farm manager: RETIRED  Social Needs  . Financial resource strain: Not on file  . Food insecurity:    Worry: Not on file    Inability: Not on file  . Transportation needs:    Medical: Not on file    Non-medical: Not on file  Tobacco Use  . Smoking status: Former Smoker    Packs/day: 0.50    Years: 40.00    Pack years: 20.00    Types: Cigarettes, Cigars    Last attempt to quit: 04/29/1998    Years since quitting: 19.8  . Smokeless tobacco: Never Used  . Tobacco comment: 4 cigars  Substance and Sexual Activity  . Alcohol use: No    Comment: occasionally  .  Drug use: No  . Sexual activity: Not on file  Lifestyle  . Physical activity:    Days per week: Not on file    Minutes per session: Not on file  . Stress: Not on file  Relationships  . Social connections:    Talks on phone: Not on file    Gets together: Not on file    Attends religious service: Not on file    Active member of club or organization: Not on file    Attends meetings of clubs or organizations: Not on file    Relationship status: Not on file  Other Topics Concern  . Not on file  Social History Narrative   Married, 1 son, 1 granddaughter.   Retired from [employer] 2000.   Hobbies: trading farm equipment.  Used to be a Environmental manager.   Tobacco x many years, quit @ 2005   No ETOH or drugs.   No regular exercise.Smoking Status:  quit             Outpatient Encounter Medications as of 03/06/2018  Medication Sig  . albuterol (PROAIR HFA) 108 (90 Base) MCG/ACT inhaler INHALE 2 PUFFS INTO THE LUNGS EVERY 4 HOURS AS NEEDED FOR WHEEZING  . allopurinol (ZYLOPRIM) 300 MG tablet 1 tab po bid  . aspirin 81 MG tablet Take 81 mg by mouth daily.   . chlorthalidone (HYGROTON) 25 MG tablet Take 25 mg by mouth daily.  . Cholecalciferol (VITAMIN D) 2000 UNITS tablet Take 2,000 Units by mouth daily.  Marland Kitchen docusate sodium (COLACE) 100 MG capsule Take 100 mg by mouth daily.  .  fluticasone (FLONASE) 50 MCG/ACT nasal spray Place 2 sprays into both nostrils daily. (Patient taking differently: Place 2 sprays into both nostrils daily as needed for allergies. )  . glipiZIDE (GLUCOTROL XL) 10 MG 24 hr tablet TAKE 1 TABLET BY MOUTH  DAILY  . INVOKANA 100 MG TABS tablet TAKE 1 TABLET BY MOUTH EVERY DAY BEFORE BREAKFAST  . metFORMIN (GLUCOPHAGE) 1000 MG tablet TAKE 1 TABLET BY MOUTH TWO  TIMES DAILY WITH MEALS  . metoprolol (LOPRESSOR) 50 MG tablet Take 1 tablet (50 mg total) by mouth 2 (two) times daily. As per Dr. Einar Gip  . mometasone-formoterol (DULERA) 200-5 MCG/ACT AERO Inhale 2 puffs into the lungs 2 (two) times daily. (Patient taking differently: Inhale 2 puffs into the lungs daily as needed for wheezing or shortness of breath. )  . pantoprazole (PROTONIX) 40 MG tablet TAKE 1 TABLET BY MOUTH  DAILY (Patient taking differently: Take 40 mg by mouth every other day. )  . rosuvastatin (CRESTOR) 20 MG tablet TAKE 1 TABLET BY MOUTH AT  BEDTIME  . verapamil (VERELAN PM) 120 MG 24 hr capsule Take 1 capsule (120 mg total) by mouth daily.  Marland Kitchen glucose blood (ONE TOUCH ULTRA TEST) test strip Use as instructed to check blood sugar.  DX 250.00 (Patient not taking: Reported on 03/06/2018)  . ONE TOUCH LANCETS MISC Use as directed to check blood sugar.  DX 250.00 (Patient not taking: Reported on 12/19/2017)  . Tdap (BOOSTRIX) 5-2.5-18.5 LF-MCG/0.5 injection Inject 0.5 mLs into the muscle once for 1 dose.   No facility-administered encounter medications on file as of 03/06/2018.     Activities of Daily Living In your present state of health, do you have any difficulty performing the following activities: 03/06/2018  Hearing? N  Vision? N  Difficulty concentrating or making decisions? N  Walking or climbing stairs? Y  Comment  knee pain  Dressing or bathing? N  Doing errands, shopping? N  Preparing Food and eating ? N  Using the Toilet? N  In the past six months, have you accidently leaked  urine? N  Do you have problems with loss of bowel control? N  Managing your Medications? N  Managing your Finances? N  Housekeeping or managing your Housekeeping? N  Some recent data might be hidden    Patient Care Team: Tammi Sou, MD as PCP - General Adrian Prows, MD as Consulting Physician (Cardiology) Chesley Mires, MD as Consulting Physician (Pulmonary Disease) Izora Gala, MD as Consulting Physician (Otolaryngology) Rutherford Guys, MD as Consulting Physician (Ophthalmology) Inocencio Homes, DPM as Consulting Physician (Podiatry) Rigoberto Noel, MD as Consulting Physician (Pulmonary Disease) [place], Mike Gip, MD as Consulting Physician (Pulmonary Disease) Gatha Mayer, MD as Consulting Physician (Gastroenterology) Darreld Mclean, MD as Referring Physician (Dermatology)   Assessment:   This is a routine wellness examination for [place].  Exercise Activities and Dietary recommendations Current Exercise Habits: The patient does not participate in regular exercise at present(active in yard), Exercise limited by: orthopedic condition(s)   Diet (meal preparation, eat out, water intake, caffeinated beverages, dairy products, fruits and vegetables): Drinks coffee water and diet soda.  Breakfast: eggs, fruit Lunch: sandwich, soup Dinner: protein and starch  Goals    . Weight (lb) < 250 lb (113.4 kg)     Lose weight increasing vegetable intake.     . Weight (lb) < 265 lb (120.2 kg)     Lose weight by watching food intake.        Fall Risk Fall Risk  03/06/2018 02/28/2017 02/28/2017 11/28/2015 08/15/2015  Falls in the past year? 1 No No No No  Comment Jumped off tractor - - - -  Number falls in past yr: 0 - - - -  Injury with Fall? 0 - - - -    Depression Screen PHQ 2/9 Scores 03/06/2018 02/28/2017 11/28/2015 08/15/2015  PHQ - 2 Score 0 0 0 0    Cognitive Function MMSE - Mini Mental State Exam 03/06/2018  Orientation to time 5  Orientation to Place 5  Registration 3   Attention/ Calculation 5  Recall 2  Language- name 2 objects 2  Language- repeat 1  Language- follow 3 step command 3  Language- read & follow direction 1  Write a sentence 1  Copy design 1  Total score 29        Immunization History  Administered Date(s) Administered  . Hepatitis B 06/08/2012, 07/10/2012  . Hepatitis B, adult 11/30/2012  . Influenza Split 02/26/2011, 01/16/2012  . Influenza Whole 12/28/2009  . Influenza, High Dose Seasonal PF 01/24/2016, 12/29/2016  . Influenza,inj,Quad PF,6+ Mos 02/05/2013, 02/02/2014, 01/24/2016  . Influenza-Unspecified 02/04/2015, 01/16/2018  . Pneumococcal Conjugate-13 08/03/2013  . Pneumococcal Polysaccharide-23 06/06/2011, 01/21/2012, 02/28/2017  . Td 04/30/2007  . Zoster 12/03/2011    Screening Tests Health Maintenance  Topic Date Due  . TETANUS/TDAP  04/29/2017  . FOOT EXAM  11/18/2017  . URINE MICROALBUMIN  11/18/2017  . HEMOGLOBIN A1C  11/30/2017  . OPHTHALMOLOGY EXAM  01/13/2018  . COLONOSCOPY  12/19/2020  . INFLUENZA VACCINE  Completed  . Hepatitis C Screening  Completed  . PNA vac Low Risk Adult  Completed        Plan:     Schedule eye exam.   Continue doing brain stimulating activities (puzzles, reading, adult coloring books, staying active) to keep memory sharp.  Bring a copy of your living will and/or healthcare power of attorney to your next office visit.  I have personally reviewed and noted the following in the patient's chart:   . Medical and social history . Use of alcohol, tobacco or illicit drugs  . Current medications and supplements . Functional ability and status . Nutritional status . Physical activity . Advanced directives . List of other physicians . Hospitalizations, surgeries, and ER visits in previous 12 months . Vitals . Screenings to include cognitive, depression, and falls . Referrals and appointments  In addition, I have reviewed and discussed with patient certain preventive  protocols, quality metrics, and best practice recommendations. A written personalized care plan for preventive services as well as general preventive health recommendations were provided to patient.     Gerilyn Nestle, RN  03/06/2018

## 2018-03-06 ENCOUNTER — Encounter: Payer: Self-pay | Admitting: Family Medicine

## 2018-03-06 ENCOUNTER — Ambulatory Visit (INDEPENDENT_AMBULATORY_CARE_PROVIDER_SITE_OTHER): Payer: Medicare Other

## 2018-03-06 ENCOUNTER — Ambulatory Visit (INDEPENDENT_AMBULATORY_CARE_PROVIDER_SITE_OTHER): Payer: Medicare Other | Admitting: Family Medicine

## 2018-03-06 ENCOUNTER — Other Ambulatory Visit: Payer: Self-pay

## 2018-03-06 VITALS — BP 132/70 | HR 63 | Temp 98.2°F | Resp 18 | Wt 263.0 lb

## 2018-03-06 VITALS — BP 132/70 | HR 63 | Resp 18 | Ht 69.0 in | Wt 263.0 lb

## 2018-03-06 DIAGNOSIS — E669 Obesity, unspecified: Secondary | ICD-10-CM

## 2018-03-06 DIAGNOSIS — E7800 Pure hypercholesterolemia, unspecified: Secondary | ICD-10-CM

## 2018-03-06 DIAGNOSIS — Z23 Encounter for immunization: Secondary | ICD-10-CM

## 2018-03-06 DIAGNOSIS — E119 Type 2 diabetes mellitus without complications: Secondary | ICD-10-CM

## 2018-03-06 DIAGNOSIS — Z0000 Encounter for general adult medical examination without abnormal findings: Secondary | ICD-10-CM | POA: Diagnosis not present

## 2018-03-06 DIAGNOSIS — I10 Essential (primary) hypertension: Secondary | ICD-10-CM | POA: Diagnosis not present

## 2018-03-06 LAB — MICROALBUMIN / CREATININE URINE RATIO
CREATININE, U: 106.7 mg/dL
MICROALB UR: 1.8 mg/dL (ref 0.0–1.9)
MICROALB/CREAT RATIO: 1.7 mg/g (ref 0.0–30.0)

## 2018-03-06 LAB — COMPREHENSIVE METABOLIC PANEL
ALBUMIN: 4.5 g/dL (ref 3.5–5.2)
ALT: 19 U/L (ref 0–53)
AST: 26 U/L (ref 0–37)
Alkaline Phosphatase: 51 U/L (ref 39–117)
BILIRUBIN TOTAL: 0.8 mg/dL (ref 0.2–1.2)
BUN: 13 mg/dL (ref 6–23)
CALCIUM: 9.4 mg/dL (ref 8.4–10.5)
CO2: 27 meq/L (ref 19–32)
Chloride: 109 mEq/L (ref 96–112)
Creatinine, Ser: 1 mg/dL (ref 0.40–1.50)
GFR: 78.27 mL/min (ref >60.00)
Glucose, Bld: 117 mg/dL — ABNORMAL HIGH (ref 70–99)
Potassium: 4.4 mEq/L (ref 3.5–5.1)
Sodium: 142 mEq/L (ref 135–145)
Total Protein: 6.6 g/dL (ref 6.0–8.3)

## 2018-03-06 LAB — HEMOGLOBIN A1C: Hgb A1c MFr Bld: 7 % — ABNORMAL HIGH (ref 4.6–6.5)

## 2018-03-06 LAB — LIPID PANEL
CHOL/HDL RATIO: 3
CHOLESTEROL: 83 mg/dL (ref 0–200)
HDL: 32.7 mg/dL — ABNORMAL LOW (ref >39.00)
LDL Cholesterol: 29 mg/dL (ref 0–99)
NONHDL: 50.48
Triglycerides: 105 mg/dL (ref 0.0–149.0)
VLDL: 21 mg/dL (ref 0.0–40.0)

## 2018-03-06 MED ORDER — TETANUS-DIPHTH-ACELL PERTUSSIS 5-2.5-18.5 LF-MCG/0.5 IM SUSP: 0.5000 mL | Freq: Once | INTRAMUSCULAR | 0 refills | Status: AC

## 2018-03-06 NOTE — Progress Notes (Signed)
OFFICE VISIT  03/06/2018   CC:  Chief Complaint  Patient presents with  . Follow-up    RCI   HPI:    Patient is a 72 y.o. Caucasian male who presents for f/u DM 2, HTN, and HLD. I last saw him 9 mo ago.  He has no acute complaints. He has been keeping up with cardiology f/u well.  DM:  No home glucose monitoring.  He is trying to watch his diet. He works in his yard regularly.  HTN: no home bp monitoring.  HLD:  Tolerating statin well.  ROS: no CP, no SOB, no wheezing, no cough, no dizziness, no HAs, no rashes, no melena/hematochezia.  No polyuria or polydipsia.  No myalgias or arthralgias.   Past Medical History:  Diagnosis Date  . Allergy   . Carotid bruit 05/28/10   Tortuosity of carotids on doppler u/s, no stenosis.  . Cataract   . Chronic diastolic heart failure (HCC)    due to HOCM  . Colon cancer screening    cologuard POSITIVE 09/2017-->colonoscopy 12/19/17; multiple adenomatous polyps, recall 3 yrs.  Marland Kitchen COPD (chronic obstructive pulmonary disease) ([place])   . Diabetes mellitus    type 2- non insulin-requiring  . DOE (dyspnea on exertion)    Cardiology w/u unrevealing except LVOT obstruction: suspect dyspnea due to HOCM, morbid obesity and OSA--stable as of 02/2017 cardiol f/u.  Marland Kitchen Fatty liver u/s and CT 2009   w/mildly elevated transaminases; u/s 12/2012 reconfirmed dx.  Hep B and C testing negative.  Marland Kitchen GERD (gastroesophageal reflux disease)    EDG 04-03-01, + distal esophageal stricture dilation  . Gout   . Heart murmur   . Hiatal hernia   . Hx of adenomatous colonic polyps 12/27/2017  . Hyperlipidemia   . Hypertension    severe LVH on echo  . Hypertrophic obstructive cardiomyopathy (HOCM) ([place]) 2012   LVOT obstruction stable on echo 01/09/12 and again on f/u echo 09/29/14--with evidence of HOCM due to chordal systolic anterior motion of MV leaflet.  ECHO and myoc perf imaging planned as of 10/27/17 cards o/v  . Moderate persistent asthma 04/19/2014  . Morbid obesity  ([place])   . OSA on CPAP    Could not tolerate CPAP, not surgical candidate per Dr. Benjamine Mola.  Restarted CPAP (auto titrate 5-15 with full facemask--Dr. Alva 2017.  Getting mask fitting/leak issues worked out as of 07/2016 pulm f/u.  . Osteoarthritis, multiple sites    primarily knees and back.  . Stricture and stenosis of esophagus   . Venous insufficiency of both lower extremities     Past Surgical History:  Procedure Laterality Date  . CARDIOVASCULAR STRESS TEST  2006; 04/27/10; 10/2017   Normal stress nuclear study, normal EF.  2019: EF 40%, inferior ischemia--intermediate risk study-->Dr. Einar Gip to do cath.  . COLONOSCOPY  x 3   Most recent-->12/19/17 (after + cologuard) multiple adenomatous polyps-->recall 3 yrs.  Marland Kitchen LEFT HEART CATH AND CORONARY ANGIOGRAPHY N/A 01/06/2018   One area of 80% ostial stenosis--small and hard to visualize. No intervention.  DAPT for >1 yr recommended.  Procedure: LEFT HEART CATH AND CORONARY ANGIOGRAPHY;  Surgeon: Adrian Prows, MD;  Location: [facility];  Service: Cardiovascular;  Laterality: N/A;  . NASAL SINUS SURGERY    . Sleep study  07/2016   OSA; CPAP trial at 10 cm H20 recommended by pulm (Dr. Tennis Must Dios--[place] pulm).  . [place]   ? Discectomy  . TEE WITHOUT CARDIOVERSION  05/2010  Dr. Einar Gip; severe LVH, no valvular abnormalities  . TRANSESOPHAGEAL ECHOCARDIOGRAM  06/06/10; 12/2011; 09/2014; 11/2016   LVOT obst with 135 mm Hg PG.  Not HOCM.   No mitral regurg..  Repeat echo 01/09/12 no change.  2016: EF 60%, severe conc LV hypertrophy, normal global wall motion, LA mildly dilated, systolic anterior motion of mitral valve chord, mild MR, mild TR, mild pulm HTN--no signif change since 2013.  Repeat echo 11/2016 no signif change compared to prior studies.  . TRANSTHORACIC ECHOCARDIOGRAM  05/28/2010; 10/2017   2012; Normal LVEF, LVOT obstruction, lateral wall hypokinesis.  10/2017: EF 55%, hypertrophic CM, grd II dd, normal global wall motion.     Outpatient Medications Prior to Visit  Medication Sig Dispense Refill  . albuterol (PROAIR HFA) 108 (90 Base) MCG/ACT inhaler INHALE 2 PUFFS INTO THE LUNGS EVERY 4 HOURS AS NEEDED FOR WHEEZING 8.5 g 0  . allopurinol (ZYLOPRIM) 300 MG tablet 1 tab po bid 180 tablet 3  . aspirin 81 MG tablet Take 81 mg by mouth daily.     . chlorthalidone (HYGROTON) 25 MG tablet Take 25 mg by mouth daily.    . Cholecalciferol (VITAMIN D) 2000 UNITS tablet Take 2,000 Units by mouth daily.    Marland Kitchen docusate sodium (COLACE) 100 MG capsule Take 100 mg by mouth daily.    . fluticasone (FLONASE) 50 MCG/ACT nasal spray Place 2 sprays into both nostrils daily. (Patient taking differently: Place 2 sprays into both nostrils daily as needed for allergies. ) 48 g 3  . glipiZIDE (GLUCOTROL XL) 10 MG 24 hr tablet TAKE 1 TABLET BY MOUTH  DAILY 90 tablet 0  . glucose blood (ONE TOUCH ULTRA TEST) test strip Use as instructed to check blood sugar.  DX 250.00 (Patient not taking: Reported on 03/06/2018) 100 each prn  . INVOKANA 100 MG TABS tablet TAKE 1 TABLET BY MOUTH EVERY DAY BEFORE BREAKFAST 90 tablet 0  . metFORMIN (GLUCOPHAGE) 1000 MG tablet TAKE 1 TABLET BY MOUTH TWO  TIMES DAILY WITH MEALS 180 tablet 0  . metoprolol (LOPRESSOR) 50 MG tablet Take 1 tablet (50 mg total) by mouth 2 (two) times daily. As per Dr. Einar Gip 180 tablet 4  . mometasone-formoterol (DULERA) 200-5 MCG/ACT AERO Inhale 2 puffs into the lungs 2 (two) times daily. (Patient taking differently: Inhale 2 puffs into the lungs daily as needed for wheezing or shortness of breath. ) 1 Inhaler 0  . ONE TOUCH LANCETS MISC Use as directed to check blood sugar.  DX 250.00 (Patient not taking: Reported on 12/19/2017) 200 each prn  . pantoprazole (PROTONIX) 40 MG tablet TAKE 1 TABLET BY MOUTH  DAILY (Patient taking differently: Take 40 mg by mouth every other day. ) 90 tablet 3  . rosuvastatin (CRESTOR) 20 MG tablet TAKE 1 TABLET BY MOUTH AT  BEDTIME 90 tablet 0  . verapamil  (VERELAN PM) 120 MG 24 hr capsule Take 1 capsule (120 mg total) by mouth daily. 90 capsule 0   No facility-administered medications prior to visit.     No Known Allergies  ROS As per HPI  PE: Blood pressure 132/70, pulse 63, temperature 98.2 F (36.8 C), temperature source Oral, resp. rate 18, weight 263 lb (119.3 kg), SpO2 96 %. Gen: Alert, well appearing.  Patient is oriented to person, place, time, and situation. AFFECT: pleasant, lucid thought and speech. CV: RRR, 1-6/1 systolic murmur--unchanged.  No r/g.   LUNGS: CTA bilat, nonlabored resps, good aeration in all lung fields. EXT: no  clubbing or cyanosis.  2+ pitting edema bilat LL's.   LABS:  Lab Results  Component Value Date   TSH 3.08 04/19/2014   No results found for: WBC, HGB, HCT, MCV, PLT Lab Results  Component Value Date   CREATININE 0.94 06/02/2017   BUN 19 06/02/2017   NA 142 06/02/2017   K 4.5 06/02/2017   CL 108 06/02/2017   CO2 27 06/02/2017   Lab Results  Component Value Date   ALT 34 11/20/2016   AST 32 11/20/2016   ALKPHOS 57 11/20/2016   BILITOT 0.6 11/20/2016   Lab Results  Component Value Date   CHOL 97 02/28/2017   Lab Results  Component Value Date   HDL 35.40 (L) 02/28/2017   Lab Results  Component Value Date   LDLCALC 43 02/28/2017   Lab Results  Component Value Date   TRIG 95.0 02/28/2017   Lab Results  Component Value Date   CHOLHDL 3 02/28/2017   Lab Results  Component Value Date   PSA 0.81 02/28/2017   PSA 0.70 11/28/2015   PSA 0.71 04/19/2014   Lab Results  Component Value Date   HGBA1C 7.6 (H) 06/02/2017    IMPRESSION AND PLAN:  1) DM 2: historically has fair control. No home monitoring.  Diet and activity level are fair. Feet exam done/documented by RN who did the AWV today. Hba1c today. Needs to schedule eye exam. Urine microalb/cr today.  2) HTN: The current medical regimen is effective;  continue present plan and medications. Lytes/cr today.  3)  HLD: tolerating statin.  FLP and hepatic panel today.  An After Visit Summary was printed and given to the patient.  FOLLOW UP: Return in about 3 months (around 06/06/2018) for routine chronic illness f/u.  Signed:  Crissie Sickles, MD           03/06/2018

## 2018-03-06 NOTE — Patient Instructions (Addendum)
Schedule eye exam.   Continue doing brain stimulating activities (puzzles, reading, adult coloring books, staying active) to keep memory sharp.   Bring a copy of your living will and/or healthcare power of attorney to your next office visit.   Health Maintenance, Male A healthy lifestyle and preventive care is important for your health and wellness. Ask your health care provider about what schedule of regular examinations is right for you. What should I know about weight and diet? Eat a Healthy Diet  Eat plenty of vegetables, fruits, whole grains, low-fat dairy products, and lean protein.  Do not eat a lot of foods high in solid fats, added sugars, or salt.  Maintain a Healthy Weight Regular exercise can help you achieve or maintain a healthy weight. You should:  Do at least 150 minutes of exercise each week. The exercise should increase your heart rate and make you sweat (moderate-intensity exercise).  Do strength-training exercises at least twice a week.  Watch Your Levels of Cholesterol and Blood Lipids  Have your blood tested for lipids and cholesterol every 5 years starting at 71 years of age. If you are at high risk for heart disease, you should start having your blood tested when you are 71 years old. You may need to have your cholesterol levels checked more often if: ? Your lipid or cholesterol levels are high. ? You are older than 71 years of age. ? You are at high risk for heart disease.  What should I know about cancer screening? Many types of cancers can be detected early and may often be prevented. Lung Cancer  You should be screened every year for lung cancer if: ? You are a current smoker who has smoked for at least 30 years. ? You are a former smoker who has quit within the past 15 years.  Talk to your health care provider about your screening options, when you should start screening, and how often you should be screened.  Colorectal Cancer  Routine  colorectal cancer screening usually begins at 71 years of age and should be repeated every 5-10 years until you are 71 years old. You may need to be screened more often if early forms of precancerous polyps or small growths are found. Your health care provider may recommend screening at an earlier age if you have risk factors for colon cancer.  Your health care provider may recommend using home test kits to check for hidden blood in the stool.  A small camera at the end of a tube can be used to examine your colon (sigmoidoscopy or colonoscopy). This checks for the earliest forms of colorectal cancer.  Prostate and Testicular Cancer  Depending on your age and overall health, your health care provider may do certain tests to screen for prostate and testicular cancer.  Talk to your health care provider about any symptoms or concerns you have about testicular or prostate cancer.  Skin Cancer  Check your skin from head to toe regularly.  Tell your health care provider about any new moles or changes in moles, especially if: ? There is a change in a mole's size, shape, or color. ? You have a mole that is larger than a pencil eraser.  Always use sunscreen. Apply sunscreen liberally and repeat throughout the day.  Protect yourself by wearing long sleeves, pants, a wide-brimmed hat, and sunglasses when outside.  What should I know about heart disease, diabetes, and high blood pressure?  If you are 55-42 years of age, have  your blood pressure checked every 3-5 years. If you are 20 years of age or older, have your blood pressure checked every year. You should have your blood pressure measured twice-once when you are at a hospital or clinic, and once when you are not at a hospital or clinic. Record the average of the two measurements. To check your blood pressure when you are not at a hospital or clinic, you can use: ? An automated blood pressure machine at a pharmacy. ? A home blood pressure  monitor.  Talk to your health care provider about your target blood pressure.  If you are between 4-39 years old, ask your health care provider if you should take aspirin to prevent heart disease.  Have regular diabetes screenings by checking your fasting blood sugar level. ? If you are at a normal weight and have a low risk for diabetes, have this test once every three years after the age of 63. ? If you are overweight and have a high risk for diabetes, consider being tested at a younger age or more often.  A one-time screening for abdominal aortic aneurysm (AAA) by ultrasound is recommended for men aged 54-75 years who are current or former smokers. What should I know about preventing infection? Hepatitis B If you have a higher risk for hepatitis B, you should be screened for this virus. Talk with your health care provider to find out if you are at risk for hepatitis B infection. Hepatitis C Blood testing is recommended for:  Everyone born from [birth year] through 1964.  Anyone with known risk factors for hepatitis C.  Sexually Transmitted Diseases (STDs)  You should be screened each year for STDs including gonorrhea and chlamydia if: ? You are sexually active and are younger than 71 years of age. ? You are older than 71 years of age and your health care provider tells you that you are at risk for this type of infection. ? Your sexual activity has changed since you were last screened and you are at an increased risk for chlamydia or gonorrhea. Ask your health care provider if you are at risk.  Talk with your health care provider about whether you are at high risk of being infected with HIV. Your health care provider may recommend a prescription medicine to help prevent HIV infection.  What else can I do?  Schedule regular health, dental, and eye exams.  Stay current with your vaccines (immunizations).  Do not use any tobacco products, such as cigarettes, chewing tobacco, and  e-cigarettes. If you need help quitting, ask your health care provider.  Limit alcohol intake to no more than 2 drinks per day. One drink equals 12 ounces of beer, 5 ounces of wine, or 1 ounces of hard liquor.  Do not use street drugs.  Do not share needles.  Ask your health care provider for help if you need support or information about quitting drugs.  Tell your health care provider if you often feel depressed.  Tell your health care provider if you have ever been abused or do not feel safe at home. This information is not intended to replace advice given to you by your health care provider. Make sure you discuss any questions you have with your health care provider. Document Released: 10/12/2007 Document Revised: 12/13/2015 Document Reviewed: 01/17/2015 Elsevier Interactive Patient Education  [employer].

## 2018-03-08 NOTE — Progress Notes (Signed)
AWV reviewed and agree. Signed:  Crissie Sickles, MD           03/08/2018

## 2018-03-09 DIAGNOSIS — M9903 Segmental and somatic dysfunction of lumbar region: Secondary | ICD-10-CM | POA: Diagnosis not present

## 2018-03-09 DIAGNOSIS — M5136 Other intervertebral disc degeneration, lumbar region: Secondary | ICD-10-CM | POA: Diagnosis not present

## 2018-03-12 DIAGNOSIS — M5136 Other intervertebral disc degeneration, lumbar region: Secondary | ICD-10-CM | POA: Diagnosis not present

## 2018-03-12 DIAGNOSIS — M9903 Segmental and somatic dysfunction of lumbar region: Secondary | ICD-10-CM | POA: Diagnosis not present

## 2018-03-18 ENCOUNTER — Other Ambulatory Visit: Payer: Self-pay

## 2018-03-18 NOTE — Patient Outreach (Signed)
[facility]) Care Management  03/18/2018  Jack [name] Aug 19, 1945 [id number]   Medication Adherence call to [name] left a message for patient to call back patient is due on Rosuvastatin 20 mg under [place].   [facility] Management Direct Dial [phone]  Fax [phone] [email or web address]

## 2018-03-19 DIAGNOSIS — M9903 Segmental and somatic dysfunction of lumbar region: Secondary | ICD-10-CM | POA: Diagnosis not present

## 2018-03-19 DIAGNOSIS — M5136 Other intervertebral disc degeneration, lumbar region: Secondary | ICD-10-CM | POA: Diagnosis not present

## 2018-04-06 ENCOUNTER — Other Ambulatory Visit: Payer: Self-pay

## 2018-04-06 ENCOUNTER — Other Ambulatory Visit: Payer: Self-pay | Admitting: Family Medicine

## 2018-04-06 NOTE — Patient Outreach (Signed)
[facility]) Care Management  04/06/2018  Jack [name] Aug 24, 1945 [id number]   Medication Adherence call to [name] spoke with patient he is due on Rosuvastatin 20 mg he explain he is in the donut hole and has medication until mid January he will wait until then to re- order. Jack [name] is showing past due under [place].   [facility] Management Direct Dial [phone]  Fax [phone] [email or web address]

## 2018-05-04 ENCOUNTER — Other Ambulatory Visit: Payer: Self-pay | Admitting: Family Medicine

## 2018-05-04 NOTE — Telephone Encounter (Signed)
RF request for allopurinol LOV: 03/06/18 Next ov: None Last written: 03/03/17 #180 w/ 3RF  Please advise. Thanks.

## 2018-05-11 ENCOUNTER — Telehealth: Payer: Self-pay | Admitting: Family Medicine

## 2018-05-11 NOTE — Telephone Encounter (Unsigned)
Copied from [place] [id number]. Topic: Quick Communication - Rx Refill/Question >> May 11, 2018 11:42 AM Scherrie Gerlach wrote: Medication: verapamil (VERELAN PM) 120 MG 24 hr capsule  Jeani Hawking with the Pharmacy calling to advise this med never filled back in Sept. And this med does not come PM 120 MG. PM comes in 100 mg only and that is what the pt had been taking in the past. Please advise on what the pt needs to be taking and resend new Rx  [place], [place] [phone] (Phone) [phone] (Fax)

## 2018-05-11 NOTE — Telephone Encounter (Signed)
will route to office for final disposition; pt last seen by Dr Anitra Lauth, [place], 03/06/2018.

## 2018-05-12 DIAGNOSIS — H26493 Other secondary cataract, bilateral: Secondary | ICD-10-CM | POA: Diagnosis not present

## 2018-05-12 DIAGNOSIS — G4733 Obstructive sleep apnea (adult) (pediatric): Secondary | ICD-10-CM | POA: Diagnosis not present

## 2018-05-12 DIAGNOSIS — Z961 Presence of intraocular lens: Secondary | ICD-10-CM | POA: Diagnosis not present

## 2018-05-12 MED ORDER — VERAPAMIL HCL ER 100 MG PO CP24: 100.0000 mg | ORAL_CAPSULE | Freq: Every day | ORAL | 1 refills | Status: DC

## 2018-05-12 NOTE — Telephone Encounter (Signed)
Please advise. Thanks.  

## 2018-05-12 NOTE — Telephone Encounter (Signed)
OK, verelan cr 100 eRx'd to [facility], 90 d supply with 1 additional RF.

## 2018-05-12 NOTE — Telephone Encounter (Signed)
Pt advised, pt stated that he does not manage his medications and will have his wife call back.

## 2018-05-18 ENCOUNTER — Other Ambulatory Visit: Payer: Self-pay | Admitting: Dermatology

## 2018-05-18 DIAGNOSIS — C4441 Basal cell carcinoma of skin of scalp and neck: Secondary | ICD-10-CM | POA: Diagnosis not present

## 2018-05-18 DIAGNOSIS — D229 Melanocytic nevi, unspecified: Secondary | ICD-10-CM | POA: Diagnosis not present

## 2018-05-27 DIAGNOSIS — H26491 Other secondary cataract, right eye: Secondary | ICD-10-CM | POA: Diagnosis not present

## 2018-06-03 DIAGNOSIS — H26492 Other secondary cataract, left eye: Secondary | ICD-10-CM | POA: Diagnosis not present

## 2018-06-08 ENCOUNTER — Ambulatory Visit (INDEPENDENT_AMBULATORY_CARE_PROVIDER_SITE_OTHER): Payer: Medicare Other | Admitting: Family Medicine

## 2018-06-08 ENCOUNTER — Encounter: Payer: Self-pay | Admitting: Family Medicine

## 2018-06-08 VITALS — BP 112/68 | HR 80 | Temp 97.9°F | Resp 16 | Ht 69.0 in | Wt 257.1 lb

## 2018-06-08 DIAGNOSIS — M25561 Pain in right knee: Secondary | ICD-10-CM

## 2018-06-08 DIAGNOSIS — W19XXXA Unspecified fall, initial encounter: Secondary | ICD-10-CM

## 2018-06-08 DIAGNOSIS — H9193 Unspecified hearing loss, bilateral: Secondary | ICD-10-CM

## 2018-06-08 MED ORDER — NAPROXEN 500 MG PO TABS: 500.0000 mg | ORAL_TABLET | Freq: Two times a day (BID) | ORAL | 0 refills | Status: DC

## 2018-06-08 NOTE — Progress Notes (Signed)
Jack [name] , 01/27/1946, 73 y.o., male MRN: [id number] Patient Care Team    Relationship Specialty Notifications Start End  Tammi Sou, MD PCP - General   05/16/10    Comment: Donne Anon, MD Consulting Physician Cardiology  06/10/11   Chesley Mires, MD Consulting Physician Pulmonary Disease  05/27/12   Izora Gala, MD Consulting Physician Otolaryngology  11/03/12   Rutherford Guys, MD Consulting Physician Ophthalmology  04/20/14   Inocencio Homes, [place] Consulting Physician Podiatry  01/18/16   Rigoberto Noel, MD Consulting Physician Pulmonary Disease  01/29/16   de [place]arkin Ina, Mike Gip, MD Consulting Physician Pulmonary Disease  08/27/16    Comment: Sleep medicine  Gatha Mayer, MD Consulting Physician Gastroenterology  12/23/17   Darreld Mclean, MD Referring Physician Dermatology  03/06/18     Chief Complaint  Patient presents with  . Hearing Problem    wants ear cleaned  . Knee Injury    right, fell yesterday     Subjective: Pt presents for an OV   Hearing loss:  Pt reports he has not been able to hear as well over the last 1 year. His wife states he listens to the TV on a very high setting. He has difficulty hearing his wife's voice, but feels she is a Research scientist (life sciences). He has noticed he has started to need to look at people's face/mouth when they speak. 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 He states they were offered and encouraged but he  never did wear them routinely.    Right knee pain after fall yesterday from a ladder. He reports he lost his balance while up two steps on a ladder. He reports the fall was not far and did not hurt him. His right ankle got caught in the ladder rung and his right knee twisted- which is causing him pain. He reports he did ice it and took aleve. He is using a cane to day to keep weight off it. He reports the pain is worse with weight bearing and use of stairs.  Depression screen [facility] 2/9 03/06/2018  02/28/2017 11/28/2015 08/15/2015  Decreased Interest 0 0 0 0  Down, Depressed, Hopeless 0 0 0 0  PHQ - 2 Score 0 0 0 0    No Known Allergies Social History   Social History Narrative   Married, 1 son, 1 granddaughter.   Retired from [employer] 2000.   Hobbies: trading farm equipment.  Used to be a Environmental manager.   Tobacco x many years, quit @ 2005   No ETOH or drugs.   No regular exercise.Smoking Status:  quit            Past Medical History:  Diagnosis Date  . Allergy   . CAD (coronary artery disease)    see PSH section for details  . Carotid bruit 05/28/10   Tortuosity of carotids on doppler u/s, no stenosis.  . Cataract   . Chronic diastolic heart failure (HCC)    due to HOCM  . Colon cancer screening    cologuard POSITIVE 09/2017-->colonoscopy 12/19/17; multiple adenomatous polyps, recall 3 yrs.  Marland Kitchen COPD (chronic obstructive pulmonary disease) ([place])   . Diabetes mellitus    type 2- non insulin-requiring  . DOE (dyspnea on exertion)    Cardiology w/u unrevealing except LVOT obstruction: suspect dyspnea due to HOCM, morbid obesity and OSA--stable as of 02/2017 cardiol f/u.  Marland Kitchen  Fatty liver u/s and CT 2009   w/mildly elevated transaminases; u/s 12/2012 reconfirmed dx.  Hep B and C testing negative.  Marland Kitchen GERD (gastroesophageal reflux disease)    EDG 04-03-01, + distal esophageal stricture dilation  . Gout   . Heart murmur   . Hiatal hernia   . Hx of adenomatous colonic polyps 12/27/2017  . Hyperlipidemia   . Hypertension    severe LVH on echo  . Hypertrophic obstructive cardiomyopathy (HOCM) ([place]) 2012   LVOT obstruction stable on echo 01/09/12 and again on f/u echo 09/29/14--with evidence of HOCM due to chordal systolic anterior motion of MV leaflet.   . Moderate persistent asthma 04/19/2014  . Morbid obesity ([place])   . OSA on CPAP    Could not tolerate CPAP, not surgical candidate per Dr. Benjamine Mola.  Restarted CPAP (auto titrate 5-15 with full facemask--Dr.  Alva 2017.  Getting mask fitting/leak issues worked out as of 07/2016 pulm f/u.  . Osteoarthritis, multiple sites    primarily knees and back.  . Stricture and stenosis of esophagus   . Venous insufficiency of both lower extremities    Past Surgical History:  Procedure Laterality Date  . CARDIOVASCULAR STRESS TEST  2006; 04/27/10; 10/2017   Normal stress nuclear study, normal EF.  2019: EF 40%, inferior ischemia--intermediate risk study-->Dr. Einar Gip to do cath.  . COLONOSCOPY  x 3   Most recent-->12/19/17 (after + cologuard) multiple adenomatous polyps-->recall 3 yrs.  Marland Kitchen LEFT HEART CATH AND CORONARY ANGIOGRAPHY N/A 01/06/2018   One area of 80% ostial stenosis--small and hard to visualize. No intervention.  DAPT for >1 yr recommended.  Procedure: LEFT HEART CATH AND CORONARY ANGIOGRAPHY;  Surgeon: Adrian Prows, MD;  Location: [facility];  Service: Cardiovascular;  Laterality: N/A;  . NASAL SINUS SURGERY    . Sleep study  07/2016   OSA; CPAP trial at 10 cm H20 recommended by pulm (Dr. Tennis Must Dios--[facility]).  . [place]   ? Discectomy  . TEE WITHOUT CARDIOVERSION  05/2010   Dr. Einar Gip; severe LVH, no valvular abnormalities  . TRANSESOPHAGEAL ECHOCARDIOGRAM  06/06/10; 12/2011; 09/2014; 11/2016   LVOT obst with 135 mm Hg PG.  Not HOCM.   No mitral regurg..  Repeat echo 01/09/12 no change.  2016: EF 60%, severe conc LV hypertrophy, normal global wall motion, LA mildly dilated, systolic anterior motion of mitral valve chord, mild MR, mild TR, mild pulm HTN--no signif change since 2013.  Repeat echo 11/2016 no signif change compared to prior studies.  . TRANSTHORACIC ECHOCARDIOGRAM  05/28/2010; 10/2017   2012; Normal LVEF, LVOT obstruction, lateral wall hypokinesis.  10/2017: EF 55%, hypertrophic CM, grd II dd, normal global wall motion.   Family History  Problem Relation Age of Onset  . Diabetes Mother   . Prostate cancer Father        Prostate  . Breast cancer Sister   . Diabetes Maternal  Aunt   . Breast cancer Maternal Aunt   . Hypertension Neg Hx   . Coronary artery disease Neg Hx   . Colon cancer Neg Hx   . Stomach cancer Neg Hx   . Rectal cancer Neg Hx   . Esophageal cancer Neg Hx    Allergies as of 06/08/2018   No Known Allergies     Medication List       Accurate as of June 08, 2018 10:58 AM. Always use your most recent med list.        albuterol  108 (90 Base) MCG/ACT inhaler Commonly known as:  PROAIR HFA INHALE 2 PUFFS INTO THE LUNGS EVERY 4 HOURS AS NEEDED FOR WHEEZING   allopurinol 300 MG tablet Commonly known as:  ZYLOPRIM TAKE 1 TABLET BY MOUTH TWO  TIMES DAILY   aspirin 81 MG tablet Take 81 mg by mouth daily.   chlorthalidone 25 MG tablet Commonly known as:  HYGROTON Take 25 mg by mouth daily.   docusate sodium 100 MG capsule Commonly known as:  COLACE Take 100 mg by mouth daily.   fluticasone 50 MCG/ACT nasal spray Commonly known as:  FLONASE Place 2 sprays into both nostrils daily.   glipiZIDE 10 MG 24 hr tablet Commonly known as:  GLUCOTROL XL TAKE 1 TABLET BY MOUTH  DAILY   glucose blood test strip Commonly known as:  ONE TOUCH ULTRA TEST Use as instructed to check blood sugar.  DX 250.00   INVOKANA 100 MG Tabs tablet Generic drug:  canagliflozin TAKE 1 TABLET BY MOUTH EVERY DAY BEFORE BREAKFAST   metFORMIN 1000 MG tablet Commonly known as:  GLUCOPHAGE TAKE 1 TABLET BY MOUTH TWO  TIMES DAILY WITH MEALS   metoprolol tartrate 50 MG tablet Commonly known as:  LOPRESSOR Take 1 tablet (50 mg total) by mouth 2 (two) times daily. As per Dr. Einar Gip   mometasone-formoterol 200-5 MCG/ACT Aero Commonly known as:  DULERA Inhale 2 puffs into the lungs 2 (two) times daily.   ONE TOUCH LANCETS Misc Use as directed to check blood sugar.  DX 250.00   pantoprazole 40 MG tablet Commonly known as:  PROTONIX TAKE 1 TABLET BY MOUTH  DAILY   rosuvastatin 20 MG tablet Commonly known as:  CRESTOR TAKE 1 TABLET BY MOUTH AT   BEDTIME   verapamil 120 MG 24 hr capsule Commonly known as:  VERELAN PM Take 1 capsule by mouth daily.   Vitamin D 50 MCG (2000 UT) tablet Take 2,000 Units by mouth daily.       All past medical history, surgical history, allergies, family history, immunizations andmedications were updated in the EMR today and reviewed under the history and medication portions of their EMR.     ROS: Negative, with the exception of above mentioned in HPI   Objective:  BP 112/68 (BP Location: Left Arm, Patient Position: Sitting, Cuff Size: Large)   Pulse 80   Temp 97.9 F (36.6 C) (Oral)   Resp 16   Ht 5\' 9"  (1.753 m)   Wt 257 lb 2 oz (116.6 kg)   SpO2 97%   BMI 37.97 kg/m  Body mass index is 37.97 kg/m. Gen: Afebrile. No acute distress. Nontoxic in appearance, well developed, well nourished.  HENT: AT. [place]. Bilateral TM visualized without erythema or bulging, intact TM, EAC normal bilateral. . MMM, no oral lesions.  Eyes:Pupils Equal Round Reactive to light, Extraocular movements intact,  Conjunctiva without redness, discharge or icterus. MSK (right knee): no erythema, mild soft tissue swelling medially. Small effusion present. No bone tenderness, with exception of medial  Joint line TTP. No ligament laxity. NROM with mild discomfort on extension and + grind.  Neuro: Normal gait. PERLA. EOMi. Alert. Oriented x3     Hearing Screening   125Hz  250Hz  500Hz  1000Hz  2000Hz  3000Hz  4000Hz  6000Hz  8000Hz   Right ear:   35 40 45  60    Left ear:   30 35 60  55     No results found. No results found for this or any previous visit (from the past  24 hour(s)).  Assessment/Plan: Jack [name] is a 73 y.o. male present for OV for  Hearing difficulty of both ears Moderate to severe hearing loss on certain frequencies bilateral ears (see above). No cerumen on exam. 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 Likely secondary to long term noise pollution.  Discussed with he and his wife--->  referred to audiology for formal testing.   Fall, initial encounter/Acute pain of right knee Rest, ice, elevate when able. Use compression sleeve 24 hours a day (except showers)- provided, Naproxen every 12 hours with food for 1 week, then as needed for discomfort (prescribed).  - offered crutches, he elected to use his cane.  - naproxen (NAPROSYN) 500 MG tablet; Take 1 tablet (500 mg total) by mouth 2 (two) times daily with a meal.  Dispense: 30 tablet; Refill: 0 - F/U Dr. Anitra Lauth in 2 weeks if not improved.    Reviewed expectations re: course of current medical issues.  Discussed self-management of symptoms.  Outlined signs and symptoms indicating need for more acute intervention.  Patient verbalized understanding and all questions were answered.  Patient received an After-Visit Summary.    No orders of the defined types were placed in this encounter.  > 25 minutes spent with patient, >50% of time spent face to face    Note is dictated utilizing voice recognition software. Although note has been proof read prior to signing, occasional typographical errors still can be missed. If any questions arise, please do not hesitate to call for verification.   electronically signed by:  Howard Pouch, DO  [place]

## 2018-06-08 NOTE — Patient Instructions (Signed)
Knee Rest, ice, elevate when able. Use compression sleeve 24 hours a day (except showers), Naproxen every 12 hours with food for 1 week, then as needed for discomfort.  Follow up with Dr. Anitra Lauth in 2 weeks if worsening or not improved.    Hearing- you do have hearing loss by hearing test today- moderate to severe on some frequencies.  I have referred you to an audiologist for formal testing and management. They will call to schedule you.     Meniscus Tear  A meniscus tear is a knee injury that happens when a piece of the meniscus is torn. The meniscus is a thick, rubbery, wedge-shaped cartilage in the knee. Two menisci are located in each knee. They sit between the upper bone (femur) and lower bone (tibia) that make up the knee joint. Each meniscus acts as a shock absorber for the knee. A torn meniscus is one of the most common types of knee injuries. This injury can range from mild to severe. Surgery may be needed to repair a severe tear. What are the causes? This condition may be caused by any kneeling, squatting, twisting, or pivoting movement. Sports-related injuries are the most common cause. These often occur from:  Running and stopping suddenly. ? Changing direction. ? Being tackled or knocked off your feet.  Lifting or carrying heavy weights. As people get older, their menisci get thinner and weaker. In these people, tears can happen more easily, such as from climbing stairs. What increases the risk? You are more likely to develop this condition if you:  Play contact sports.  Have a job that requires kneeling or squatting.  Are male.  Are over 18 years old. What are the signs or symptoms? Symptoms of this condition include:  Knee pain, especially at the side of the knee joint. You may feel pain when the injury occurs, or you may only hear a pop and feel pain later.  A feeling that your knee is clicking, catching, locking, or giving way.  Not being able to fully bend  or extend your knee.  Bruising or swelling in your knee. How is this diagnosed? This condition may be diagnosed based on your symptoms and a physical exam. You may also have tests, such as:  X-rays.  MRI.  A procedure to look inside your knee with a narrow surgical telescope (arthroscopy). You may be referred to a knee specialist (orthopedic surgeon). How is this treated? Treatment for this injury depends on the severity of the tear. Treatment for a mild tear may include:  Rest.  Medicine to reduce pain and swelling. This is usually a nonsteroidal anti-inflammatory drug (NSAID), like ibuprofen.  A knee brace, sleeve, or wrap.  Using crutches or a walker to keep weight off your knee and to help you walk.  Exercises to strengthen your knee (physical therapy). You may need surgery if you have a severe tear or if other treatments are not working. Follow these instructions at home: If you have a brace, sleeve, or wrap:  Wear it as told by your health care provider. Remove it only as told by your health care provider.  Loosen the brace, sleeve, or wrap if your toes tingle, become numb, or turn cold and blue.  Keep the brace, sleeve, or wrap clean and dry.  If the brace, sleeve, or wrap is not waterproof: ? Do not let it get wet. ? Cover it with a watertight covering when you take a bath or shower. Managing pain and swelling  Take over-the-counter and prescription medicines only as told by your health care provider.  If directed, put ice on your knee: ? If you have a removable brace, sleeve, or wrap, remove it as told by your health care provider. ? Put ice in a plastic bag. ? Place a towel between your skin and the bag. ? Leave the ice on for 20 minutes, 2-3 times per day.  Move your toes often to avoid stiffness and to lessen swelling.  Raise (elevate) the injured area above the level of your heart while you are sitting or lying down. Activity  Do not use the injured  limb to support your body weight until your health care provider says that you can. Use crutches or a walker as told by your health care provider.  Return to your normal activities as told by your health care provider. Ask your health care provider what activities are safe for you.  Perform range-of-motion exercises only as told by your health care provider.  Begin doing exercises to strengthen your knee and leg muscles only as told by your health care provider. After you recover, your health care provider may recommend these exercises to help prevent another injury. General instructions  Use a knee brace, sleeve, or wrap as told by your health care provider.  Ask your health care provider when it is safe to drive if you have a brace, sleeve, or wrap on your knee.  Do not use any products that contain nicotine or tobacco, such as cigarettes, e-cigarettes, and chewing tobacco. If you need help quitting, ask your health care provider.  Ask your health care provider if the medicine prescribed to you: ? Requires you to avoid driving or using heavy machinery. ? Can cause constipation. You may need to take these actions to prevent or treat constipation:  Drink enough fluid to keep your urine pale yellow.  Take over-the-counter or prescription medicines.  Eat foods that are high in fiber, such as beans, whole grains, and fresh fruits and vegetables.  Limit foods that are high in fat and processed sugars, such as fried or sweet foods.  Keep all follow-up visits as told by your health care provider. This is important. Contact a health care provider if:  You have a fever.  Your knee becomes red, tender, or swollen.  Your pain medicine is not helping.  Your symptoms get worse or do not improve after 2 weeks of home care. Summary  A meniscus tear is a knee injury that happens when a piece of the meniscus is torn.  Treatment for this injury depends on the severity of the tear. You may  need surgery if you have a severe tear or if other treatments are not working.  Rest, ice, and raise (elevate) your injured knee as told by your health care provider. This will help lessen pain and swelling.  Contact a health care provider if you have new symptoms, or your symptoms get worse or do not improve after 2 weeks of home care.  Keep all follow-up visits as told by your health care provider. This is important. This information is not intended to replace advice given to you by your health care provider. Make sure you discuss any questions you have with your health care provider. Document Released: 07/06/2002 Document Revised: 10/28/2017 Document Reviewed: 10/28/2017 Elsevier Interactive Patient Education  2019 [employer].

## 2018-06-11 ENCOUNTER — Other Ambulatory Visit: Payer: Self-pay | Admitting: Physician Assistant

## 2018-06-11 DIAGNOSIS — L988 Other specified disorders of the skin and subcutaneous tissue: Secondary | ICD-10-CM | POA: Diagnosis not present

## 2018-06-11 DIAGNOSIS — C4441 Basal cell carcinoma of skin of scalp and neck: Secondary | ICD-10-CM | POA: Diagnosis not present

## 2018-06-15 ENCOUNTER — Other Ambulatory Visit: Payer: Self-pay | Admitting: *Deleted

## 2018-06-15 DIAGNOSIS — L089 Local infection of the skin and subcutaneous tissue, unspecified: Secondary | ICD-10-CM | POA: Diagnosis not present

## 2018-06-15 DIAGNOSIS — H9190 Unspecified hearing loss, unspecified ear: Secondary | ICD-10-CM

## 2018-07-05 ENCOUNTER — Other Ambulatory Visit: Payer: Self-pay | Admitting: Cardiology

## 2018-07-05 ENCOUNTER — Other Ambulatory Visit: Payer: Self-pay | Admitting: Family Medicine

## 2018-07-06 NOTE — Telephone Encounter (Signed)
Pt is over due for f/u [facility], will send Rx's for 90 day supply w/ 0RF. Needs office visit for more refills.   Pt advised and voiced understanding.    Apt made for 07/08/18 at 10:00am.

## 2018-07-08 ENCOUNTER — Encounter: Payer: Self-pay | Admitting: Family Medicine

## 2018-07-08 ENCOUNTER — Ambulatory Visit (INDEPENDENT_AMBULATORY_CARE_PROVIDER_SITE_OTHER): Payer: Medicare Other | Admitting: Family Medicine

## 2018-07-08 ENCOUNTER — Other Ambulatory Visit: Payer: Self-pay

## 2018-07-08 VITALS — BP 117/73 | HR 69 | Temp 98.0°F | Resp 16 | Ht 69.0 in | Wt 255.0 lb

## 2018-07-08 DIAGNOSIS — E119 Type 2 diabetes mellitus without complications: Secondary | ICD-10-CM

## 2018-07-08 DIAGNOSIS — I10 Essential (primary) hypertension: Secondary | ICD-10-CM | POA: Diagnosis not present

## 2018-07-08 LAB — BASIC METABOLIC PANEL
BUN: 18 mg/dL (ref 6–23)
CO2: 30 mEq/L (ref 19–32)
Calcium: 9.7 mg/dL (ref 8.4–10.5)
Chloride: 102 mEq/L (ref 96–112)
Creatinine, Ser: 1.01 mg/dL (ref 0.40–1.50)
GFR: 72.73 mL/min (ref >60.00)
Glucose, Bld: 138 mg/dL — ABNORMAL HIGH (ref 70–99)
POTASSIUM: 3.8 meq/L (ref 3.5–5.1)
Sodium: 140 mEq/L (ref 135–145)

## 2018-07-08 LAB — HEMOGLOBIN A1C: Hgb A1c MFr Bld: 7.2 % — ABNORMAL HIGH (ref 4.6–6.5)

## 2018-07-08 NOTE — Progress Notes (Signed)
OFFICE VISIT  07/08/2018   CC:  Chief Complaint  Patient presents with  . Follow-up    [facility], pt is fasting   HPI:    Patient is a 73 y.o. Caucasian male who presents accompanied by his wife for 6 mo f/u DM 2 and HTN.  Doing pretty well.  Getting over a recent R knee pain problem. Working on losing weight but sounds like not being very aggressive.. Glucoses: he will not give a specific # but says "pretty good". HTN: no home bp checks.  ROS: no CP, no SOB, no wheezing, no cough, no dizziness, no HAs, no rashes, no melena/hematochezia.  No polyuria or polydipsia.  No myalgias or arthralgias.   Past Medical History:  Diagnosis Date  . Allergy   . CAD (coronary artery disease)    see PSH section for details  . Carotid bruit 05/28/10   Tortuosity of carotids on doppler u/s, no stenosis.  . Cataract   . Chronic diastolic heart failure (HCC)    due to HOCM  . Colon cancer screening    cologuard POSITIVE 09/2017-->colonoscopy 12/19/17; multiple adenomatous polyps, recall 3 yrs.  Marland Kitchen COPD (chronic obstructive pulmonary disease) ([place])   . Diabetes mellitus    type 2- non insulin-requiring  . DOE (dyspnea on exertion)    Cardiology w/u unrevealing except LVOT obstruction: suspect dyspnea due to HOCM, morbid obesity and OSA--stable as of 02/2017 cardiol f/u.  Marland Kitchen Fatty liver u/s and CT 2009   w/mildly elevated transaminases; u/s 12/2012 reconfirmed dx.  Hep B and C testing negative.  Marland Kitchen GERD (gastroesophageal reflux disease)    EDG 04-03-01, + distal esophageal stricture dilation  . Gout   . Heart murmur   . Hiatal hernia   . Hx of adenomatous colonic polyps 12/27/2017  . Hyperlipidemia   . Hypertension    severe LVH on echo  . 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   . Moderate persistent asthma 04/19/2014  . Morbid  obesity ([place])   . OSA on CPAP    Could not tolerate CPAP, not surgical candidate per Dr. Benjamine Mola.  Restarted CPAP (auto titrate 5-15 with full facemask--Dr. Alva 2017.  Getting mask fitting/leak issues worked out as of 07/2016 pulm f/u.  . Osteoarthritis, multiple sites    primarily knees and back.  . Stricture and stenosis of esophagus   . Venous insufficiency of both lower extremities     Past Surgical History:  Procedure Laterality Date  . CARDIOVASCULAR STRESS TEST  2006; 04/27/10; 10/2017   Normal stress nuclear study, normal EF.  2019: EF 40%, inferior ischemia--intermediate risk study-->Dr. Einar Gip to do cath.  . COLONOSCOPY  x 3   Most recent-->12/19/17 (after + cologuard) multiple adenomatous polyps-->recall 3 yrs.  Marland Kitchen LEFT HEART CATH AND CORONARY ANGIOGRAPHY N/A 01/06/2018   One area of 80% ostial stenosis--small and hard to visualize. No intervention.  DAPT for >1 yr recommended.  Procedure: LEFT HEART CATH AND CORONARY ANGIOGRAPHY;  Surgeon: Adrian Prows, MD;  Location: [facility];  Service: Cardiovascular;  Laterality: N/A;  . NASAL SINUS SURGERY    . Sleep study  07/2016   OSA; CPAP trial at 10 cm H20 recommended by pulm (Dr. Tennis Must Dios--[facility]).  . [place]   ? Discectomy  . TEE WITHOUT CARDIOVERSION  05/2010   Dr.  Ganji; severe LVH, no valvular abnormalities  . TRANSESOPHAGEAL ECHOCARDIOGRAM  06/06/10; 12/2011; 09/2014; 11/2016   LVOT obst with 135 mm Hg PG.  Not HOCM.   No mitral regurg..  Repeat echo 01/09/12 no change.  2016: EF 60%, severe conc LV hypertrophy, normal global wall motion, LA mildly dilated, systolic anterior motion of mitral valve chord, mild MR, mild TR, mild pulm HTN--no signif change since 2013.  Repeat echo 11/2016 no signif change compared to prior studies.  . TRANSTHORACIC ECHOCARDIOGRAM  05/28/2010; 10/2017   2012; Normal LVEF, LVOT obstruction, lateral wall hypokinesis.  10/2017: EF 55%, hypertrophic CM, grd II dd, normal global wall motion.     Outpatient Medications Prior to Visit  Medication Sig Dispense Refill  . albuterol (PROAIR HFA) 108 (90 Base) MCG/ACT inhaler INHALE 2 PUFFS INTO THE LUNGS EVERY 4 HOURS AS NEEDED FOR WHEEZING 8.5 g 0  . allopurinol (ZYLOPRIM) 300 MG tablet TAKE 1 TABLET BY MOUTH TWO  TIMES DAILY 180 tablet 3  . aspirin 81 MG tablet Take 81 mg by mouth daily.     . chlorthalidone (HYGROTON) 25 MG tablet TAKE 1 TABLET BY MOUTH EVERY MORNING 90 tablet 1  . Cholecalciferol (VITAMIN D) 2000 UNITS tablet Take 2,000 Units by mouth daily.    Marland Kitchen docusate sodium (COLACE) 100 MG capsule Take 100 mg by mouth daily.    . fluticasone (FLONASE) 50 MCG/ACT nasal spray Place 2 sprays into both nostrils daily. (Patient taking differently: Place 2 sprays into both nostrils daily as needed for allergies. ) 48 g 3  . glipiZIDE (GLUCOTROL XL) 10 MG 24 hr tablet Take 1 tablet (10 mg total) by mouth daily. OFFICE VISIT NEEDED 90 tablet 0  . glucose blood (ONE TOUCH ULTRA TEST) test strip Use as instructed to check blood sugar.  DX 250.00 100 each prn  . INVOKANA 100 MG TABS tablet TAKE 1 TABLET BY MOUTH EVERY DAY BEFORE BREAKFAST 90 tablet 0  . metFORMIN (GLUCOPHAGE) 1000 MG tablet Take 1 tablet (1,000 mg total) by mouth 2 (two) times daily with a meal. OFFICE VISIT NEEDED 180 tablet 0  . metoprolol (LOPRESSOR) 50 MG tablet Take 1 tablet (50 mg total) by mouth 2 (two) times daily. As per Dr. Einar Gip 180 tablet 4  . mometasone-formoterol (DULERA) 200-5 MCG/ACT AERO Inhale 2 puffs into the lungs 2 (two) times daily. (Patient taking differently: Inhale 2 puffs into the lungs daily as needed for wheezing or shortness of breath. ) 1 Inhaler 0  . ONE TOUCH LANCETS MISC Use as directed to check blood sugar.  DX 250.00 200 each prn  . pantoprazole (PROTONIX) 40 MG tablet TAKE 1 TABLET BY MOUTH  DAILY 90 tablet 1  . rosuvastatin (CRESTOR) 20 MG tablet Take 1 tablet (20 mg total) by mouth at bedtime. OFFICE VISIT NEEDED 90 tablet 0  . verapamil  (VERELAN PM) 120 MG 24 hr capsule Take 1 capsule by mouth daily.    . naproxen (NAPROSYN) 500 MG tablet Take 1 tablet (500 mg total) by mouth 2 (two) times daily with a meal. (Patient not taking: Reported on 07/08/2018) 30 tablet 0   No facility-administered medications prior to visit.     No Known Allergies  ROS As per HPI  PE: Blood pressure 117/73, pulse 69, temperature 98 F (36.7 C), temperature source Oral, resp. rate 16, height 5\' 9"  (1.753 m), weight 255 lb (115.7 kg), SpO2 96 %. Body mass index is 37.66 kg/m.  Gen: Alert, well appearing.  Patient is oriented to person, place, time, and situation. AFFECT: pleasant, lucid thought and speech. CV: RRR, 3/6 holosystolic murmur, no r/g.   LUNGS: CTA bilat, nonlabored resps, good aeration in all lung fields. EXT: no clubbing or cyanosis.  Trace-1+ pitting on RLL, none on L LL.   LABS:  Lab Results  Component Value Date   TSH 3.08 04/19/2014   No results found for: WBC, HGB, HCT, MCV, PLT Lab Results  Component Value Date   CREATININE 1.00 03/06/2018   BUN 13 03/06/2018   NA 142 03/06/2018   K 4.4 03/06/2018   CL 109 03/06/2018   CO2 27 03/06/2018   Lab Results  Component Value Date   ALT 19 03/06/2018   AST 26 03/06/2018   ALKPHOS 51 03/06/2018   BILITOT 0.8 03/06/2018   Lab Results  Component Value Date   CHOL 83 03/06/2018   Lab Results  Component Value Date   HDL 32.70 (L) 03/06/2018   Lab Results  Component Value Date   LDLCALC 29 03/06/2018   Lab Results  Component Value Date   TRIG 105.0 03/06/2018   Lab Results  Component Value Date   CHOLHDL 3 03/06/2018   Lab Results  Component Value Date   PSA 0.81 02/28/2017   PSA 0.70 11/28/2015   PSA 0.71 04/19/2014   Lab Results  Component Value Date   HGBA1C 7.0 (H) 03/06/2018    IMPRESSION AND PLAN:  1) DM 2. Hba1c today. Continue current meds for now. Work harder on diet/exercise. Most recent eye exam 05/2018-normal per pt  report.  2) HTN: The current medical regimen is effective;  continue present plan and medications. BMET today.  An After Visit Summary was printed and given to the patient.  FOLLOW UP: Return in about 3 months (around 10/08/2018) for annual CPE (fasting).  Signed:  Crissie Sickles, MD           07/08/2018

## 2018-07-09 ENCOUNTER — Telehealth: Payer: Self-pay | Admitting: Family Medicine

## 2018-07-09 ENCOUNTER — Other Ambulatory Visit: Payer: Self-pay | Admitting: Family Medicine

## 2018-07-09 MED ORDER — CANAGLIFLOZIN 100 MG PO TABS: ORAL_TABLET | ORAL | 6 refills | Status: DC

## 2018-07-09 NOTE — Telephone Encounter (Signed)
Copied from [place] [id number]. Topic: General - Other >> Jul 09, 2018 12:54 PM Lennox Solders wrote: Reason for CRM:pt is on invokana and pt wife would like lisa to return her call. Pt wife would like to know if generic medication is avail. Pt unable to afford med. Pt saw dr Anitra Lauth yesterday

## 2018-07-09 NOTE — Telephone Encounter (Signed)
No. No generic available. If he starts getting serious about limiting sugars and starches and eats NO FAST FOODS and NO sweet tea or colas AND starts exercising daily, then we can hold off from increasing his invokana. Is he going to still be able to take invokana 100mg  per day like he has been?

## 2018-07-10 MED ORDER — CANAGLIFLOZIN 100 MG PO TABS: 100.0000 mg | ORAL_TABLET | Freq: Every day | ORAL | 6 refills | Status: DC

## 2018-07-10 NOTE — Telephone Encounter (Signed)
Fine

## 2018-07-10 NOTE — Telephone Encounter (Signed)
Medication list updated and refill request for Invokana faxed back to patient's pharmacy.

## 2018-07-10 NOTE — Telephone Encounter (Signed)
Patients wife states that they will keep it at 100mg  and she will make him work on his diet.

## 2018-07-14 ENCOUNTER — Other Ambulatory Visit: Payer: Self-pay | Admitting: Family Medicine

## 2018-07-14 DIAGNOSIS — J209 Acute bronchitis, unspecified: Secondary | ICD-10-CM

## 2018-07-16 ENCOUNTER — Ambulatory Visit: Payer: Self-pay | Admitting: Cardiology

## 2018-08-10 ENCOUNTER — Other Ambulatory Visit: Payer: Self-pay

## 2018-08-10 ENCOUNTER — Ambulatory Visit (INDEPENDENT_AMBULATORY_CARE_PROVIDER_SITE_OTHER): Payer: Medicare Other | Admitting: Family Medicine

## 2018-08-10 ENCOUNTER — Encounter: Payer: Self-pay | Admitting: Family Medicine

## 2018-08-10 VITALS — BP 129/84 | HR 87 | Temp 98.6°F | Wt 254.2 lb

## 2018-08-10 DIAGNOSIS — J4541 Moderate persistent asthma with (acute) exacerbation: Secondary | ICD-10-CM | POA: Diagnosis not present

## 2018-08-10 DIAGNOSIS — J301 Allergic rhinitis due to pollen: Secondary | ICD-10-CM | POA: Diagnosis not present

## 2018-08-10 DIAGNOSIS — Z9114 Patient's other noncompliance with medication regimen: Secondary | ICD-10-CM

## 2018-08-10 DIAGNOSIS — J019 Acute sinusitis, unspecified: Secondary | ICD-10-CM | POA: Diagnosis not present

## 2018-08-10 MED ORDER — PREDNISONE 20 MG PO TABS: ORAL_TABLET | ORAL | 0 refills | Status: DC

## 2018-08-10 MED ORDER — AZITHROMYCIN 250 MG PO TABS: ORAL_TABLET | ORAL | 0 refills | Status: DC

## 2018-08-10 NOTE — Progress Notes (Signed)
Virtual Visit via Video Note  I connected with Jack [name]  on 08/10/18 at  2:30 PM EDT by a video enabled telemedicine application and verified that I am speaking with the correct person using two identifiers.  Location patient: home Location provider:work office Persons participating in the virtual visit: patient, Jack [name]'s, wife, myself.  I discussed the limitations of evaluation and management by telemedicine and the availability of in person appointments. The patient expressed understanding and agreed to proceed.   HPI: 73 y/o WM with allergic rhinitis and persistent asthma who c/o PND and sinus congestion at night and coughs a lot, feels congested in chest as well.  Started around 10 d/a.  No fevers.  Has a few seconds of wheeze/rattle and then he can clear it fine. No SOB/DOE or CP.  Energy level a little down.  Appetite good.  No face or ear pains.  Not a lot of sneezing. Itchy eyes, sometimes a little bloodshot, but not draining. Taking Proair 2p bid but only restarted this about 1 mo ago, but he is NOT taking dulera at all---he was obviously confused about how to take these meds.  Daytime sx's problematic too, just not as bad as at night b/c it impairs his sleep. Taking flonase and allegra.  He tried mucinex x 1 dose.  He is not a smoker. +Spending a lot of time outside in yard lately.   ROS: See pertinent positives and negatives per HPI.  Past Medical History:  Diagnosis Date  . Allergy   . CAD (coronary artery disease)    see PSH section for details  . Carotid bruit 05/28/10   Tortuosity of carotids on doppler u/s, no stenosis.  . Cataract   . Chronic diastolic heart failure (HCC)    due to HOCM  . Colon cancer screening    cologuard POSITIVE 09/2017-->colonoscopy 12/19/17; multiple adenomatous polyps, recall 3 yrs.  Marland Kitchen COPD (chronic obstructive pulmonary disease) ([place])   . Diabetes mellitus    type 2- non insulin-requiring  . DOE (dyspnea on exertion)    Cardiology w/u unrevealing  except LVOT obstruction: suspect dyspnea due to HOCM, morbid obesity and OSA--stable as of 02/2017 cardiol f/u.  Marland Kitchen Fatty liver u/s and CT 2009   w/mildly elevated transaminases; u/s 12/2012 reconfirmed dx.  Hep B and C testing negative.  Marland Kitchen GERD (gastroesophageal reflux disease)    EDG 04-03-01, + distal esophageal stricture dilation  . Gout   . Heart murmur   . Hiatal hernia   . Hx of adenomatous colonic polyps 12/27/2017  . Hyperlipidemia   . Hypertension    severe LVH on echo  . 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   . Moderate persistent asthma 04/19/2014  . Morbid obesity ([place])   . OSA on CPAP    Could not tolerate CPAP, not surgical candidate per Dr. Benjamine Mola.  Restarted CPAP (auto titrate 5-15 with full facemask--Dr. Alva 2017.  Getting mask fitting/leak issues worked out as of 07/2016 pulm f/u.  . Osteoarthritis, multiple sites    primarily knees and back.  . Stricture and stenosis of esophagus   . Venous insufficiency of both lower extremities     Past Surgical History:  Procedure Laterality Date  . CARDIOVASCULAR STRESS TEST  2006; 04/27/10; 10/2017   Normal stress nuclear study, normal EF.  2019: EF 40%, inferior ischemia--intermediate risk  study-->Dr. Einar Gip to do cath.  . COLONOSCOPY  x 3   Most recent-->12/19/17 (after + cologuard) multiple adenomatous polyps-->recall 3 yrs.  Marland Kitchen LEFT HEART CATH AND CORONARY ANGIOGRAPHY N/A 01/06/2018   One area of 80% ostial stenosis--small and hard to visualize. No intervention.  DAPT for >1 yr recommended.  Procedure: LEFT HEART CATH AND CORONARY ANGIOGRAPHY;  Surgeon: Adrian Prows, MD;  Location: [facility];  Service: Cardiovascular;  Laterality: N/A;  . NASAL SINUS SURGERY    . Sleep study  07/2016   OSA; CPAP trial at 10 cm H20 recommended by pulm (Dr. Tennis Must Dios--[place]  pulm).  . [place]   ? Discectomy  . TEE WITHOUT CARDIOVERSION  05/2010   Dr. Einar Gip; severe LVH, no valvular abnormalities  . TRANSESOPHAGEAL ECHOCARDIOGRAM  06/06/10; 12/2011; 09/2014; 11/2016   LVOT obst with 135 mm Hg PG.  Not HOCM.   No mitral regurg..  Repeat echo 01/09/12 no change.  2016: EF 60%, severe conc LV hypertrophy, normal global wall motion, LA mildly dilated, systolic anterior motion of mitral valve chord, mild MR, mild TR, mild pulm HTN--no signif change since 2013.  Repeat echo 11/2016 no signif change compared to prior studies.  . TRANSTHORACIC ECHOCARDIOGRAM  05/28/2010; 10/2017   2012; Normal LVEF, LVOT obstruction, lateral wall hypokinesis.  10/2017: EF 55%, hypertrophic CM, grd II dd, normal global wall motion.    Family History  Problem Relation Age of Onset  . Diabetes Mother   . Prostate cancer Father        Prostate  . Breast cancer Sister   . Diabetes Maternal Aunt   . Breast cancer Maternal Aunt   . Hypertension Neg Hx   . Coronary artery disease Neg Hx   . Colon cancer Neg Hx   . Stomach cancer Neg Hx   . Rectal cancer Neg Hx   . Esophageal cancer Neg Hx     SOCIAL HX: lives with wife.  Observing covid 19 quarantine restrictions.   Current Outpatient Medications:  .  albuterol (PROAIR HFA) 108 (90 Base) MCG/ACT inhaler, INHALE 2 PUFFS INTO THE LUNGS EVERY 4 HOURS AS NEEDED FOR WHEEZING, Disp: 8.5 g, Rfl: 0 .  allopurinol (ZYLOPRIM) 300 MG tablet, TAKE 1 TABLET BY MOUTH TWO  TIMES DAILY, Disp: 180 tablet, Rfl: 3 .  aspirin 81 MG tablet, Take 81 mg by mouth daily. , Disp: , Rfl:  .  canagliflozin (INVOKANA) 100 MG TABS tablet, Take 1 tablet (100 mg total) by mouth daily before breakfast. Take 100 mg po daily, Disp: 60 tablet, Rfl: 6 .  chlorthalidone (HYGROTON) 25 MG tablet, TAKE 1 TABLET BY MOUTH EVERY MORNING, Disp: 90 tablet, Rfl: 1 .  Cholecalciferol (VITAMIN D) 2000 UNITS tablet, Take 2,000 Units by mouth daily., Disp: , Rfl:  .  docusate sodium  (COLACE) 100 MG capsule, Take 100 mg by mouth daily., Disp: , Rfl:  .  fluticasone (FLONASE) 50 MCG/ACT nasal spray, Place 2 sprays into both nostrils daily as needed for allergies., Disp: 48 g, Rfl: 1 .  glipiZIDE (GLUCOTROL XL) 10 MG 24 hr tablet, Take 1 tablet (10 mg total) by mouth daily. OFFICE VISIT NEEDED, Disp: 90 tablet, Rfl: 0 .  glucose blood (ONE TOUCH ULTRA TEST) test strip, Use as instructed to check blood sugar.  DX 250.00, Disp: 100 each, Rfl: prn .  metFORMIN (GLUCOPHAGE) 1000 MG tablet, Take 1 tablet (1,000 mg total) by mouth 2 (two) times daily with a meal. OFFICE  VISIT NEEDED, Disp: 180 tablet, Rfl: 0 .  metoprolol (LOPRESSOR) 50 MG tablet, Take 1 tablet (50 mg total) by mouth 2 (two) times daily. As per Dr. Einar Gip, Disp: 180 tablet, Rfl: 4 .  mometasone-formoterol (DULERA) 200-5 MCG/ACT AERO, Inhale 2 puffs into the lungs 2 (two) times daily. (Patient taking differently: Inhale 2 puffs into the lungs daily as needed for wheezing or shortness of breath. ), Disp: 1 Inhaler, Rfl: 0 .  naproxen (NAPROSYN) 500 MG tablet, Take 1 tablet (500 mg total) by mouth 2 (two) times daily with a meal., Disp: 30 tablet, Rfl: 0 .  ONE TOUCH LANCETS MISC, Use as directed to check blood sugar.  DX 250.00, Disp: 200 each, Rfl: prn .  pantoprazole (PROTONIX) 40 MG tablet, TAKE 1 TABLET BY MOUTH  DAILY, Disp: 90 tablet, Rfl: 1 .  rosuvastatin (CRESTOR) 20 MG tablet, Take 1 tablet (20 mg total) by mouth at bedtime. OFFICE VISIT NEEDED, Disp: 90 tablet, Rfl: 0 .  verapamil (VERELAN PM) 120 MG 24 hr capsule, Take 1 capsule by mouth daily., Disp: , Rfl:   EXAM:  VITALS per patient if applicable: BP 945/85 (BP Location: Left Arm, Patient Position: Sitting, Cuff Size: Large)   Pulse 87   Temp 98.6 F (37 C) (Oral)   Wt 254 lb 3.2 oz (115.3 kg)   BMI 37.54 kg/m    GENERAL: alert, oriented, appears well and in no acute distress  HEENT: atraumatic, conjunttiva clear, no obvious abnormalities on  inspection of external nose and ears  NECK: normal movements of the head and neck  LUNGS: on inspection no signs of respiratory distress, breathing rate appears normal, no obvious gross SOB. When I ask him to take a deep breath and breath out forcefully I can hear a bit of rhonchi on insp and slight end exp wheezing. His breathing is nonlabored.    CV: no obvious cyanosis  MS: moves all visible extremities without noticeable abnormality  PSYCH/NEURO: pleasant and cooperative, no obvious depression or anxiety, speech and thought processing grossly intact  ASSESSMENT AND PLAN:  Discussed the following assessment and plan:  Acute allergic/infectious rhinosinusitis and mild acute asthma flare. He has not been compliant with the recommended inhaler regimen.  Instructed patient again on correct way to take his controller and his rescue inhalers. Continue current daily allergy meds. Add prednisone 40 mg qd x 5d, then 20mg  qd x 5d. Also Z -pack. Continue to try mucinex DM q12h prn.  I discussed the assessment and treatment plan with the patient. The patient was provided an opportunity to ask questions and all were answered. The patient agreed with the plan and demonstrated an understanding of the instructions.   The patient was advised to call back or seek an in-person evaluation if the symptoms worsen or if the condition fails to improve as anticipated.  F/u: if worsening or not improving in 1 wk.  Signed:  Crissie Sickles, MD           08/10/2018

## 2018-08-25 DIAGNOSIS — G4733 Obstructive sleep apnea (adult) (pediatric): Secondary | ICD-10-CM | POA: Diagnosis not present

## 2018-09-03 ENCOUNTER — Encounter: Payer: Self-pay | Admitting: Cardiology

## 2018-09-04 ENCOUNTER — Ambulatory Visit (INDEPENDENT_AMBULATORY_CARE_PROVIDER_SITE_OTHER): Payer: Medicare Other | Admitting: Cardiology

## 2018-09-04 ENCOUNTER — Encounter: Payer: Self-pay | Admitting: Cardiology

## 2018-09-04 ENCOUNTER — Other Ambulatory Visit: Payer: Self-pay

## 2018-09-04 VITALS — BP 138/73 | HR 79 | Temp 98.0°F | Ht 69.0 in | Wt 250.2 lb

## 2018-09-04 DIAGNOSIS — I421 Obstructive hypertrophic cardiomyopathy: Secondary | ICD-10-CM

## 2018-09-04 DIAGNOSIS — I10 Essential (primary) hypertension: Secondary | ICD-10-CM | POA: Diagnosis not present

## 2018-09-04 DIAGNOSIS — Z6841 Body Mass Index (BMI) 40.0 and over, adult: Secondary | ICD-10-CM

## 2018-09-04 DIAGNOSIS — E7800 Pure hypercholesterolemia, unspecified: Secondary | ICD-10-CM

## 2018-09-04 NOTE — Progress Notes (Signed)
Virtual Visit via Video Note: This visit type was conducted due to national recommendations for restrictions regarding the COVID-19 Pandemic (e.g. social distancing).  This format is felt to be most appropriate for this patient at this time.  All issues noted in this document were discussed and addressed.  No physical exam was performed (except for noted visual exam findings with Telehealth visits).  The patient has consented to conduct a Telehealth visit and understands insurance will be billed.   I connected with@, on 09/04/18 at  by a video enabled telemedicine application and verified that I am speaking with the correct person using two identifiers.   I discussed the limitations of evaluation and management by telemedicine and the availability of in person appointments. The patient expressed understanding and agreed to proceed.   I have discussed with patient regarding the safety during COVID Pandemic and steps and precautions to be taken including social distancing, frequent hand wash and use of detergent soap, gels with the patient. I asked the patient to avoid touching mouth, nose, eyes, ears with the hands. I encouraged regular walking around the neighborhood and exercise and regular diet, as long as social distancing can be maintained.  Primary Physician/Referring:  Tammi Sou, MD  Patient ID: Jack [name], male    DOB: 08-Aug-1945, 73 y.o.   MRN: [id number]  No chief complaint on file.   HPI: Jack [name]  is a 73 y.o. 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    He is trying his best to lose weight, accompanied by his wife at the bedside, has lost about 10 Lbs since last OV 6 months ago. has  dyspnea on exertion that is stable, leg edema has  improved and essentially resolved with avoidance of salt and chlorthalidone. Denies chest pain, dizziness or syncope. He has been compliant on CPAP. Leg edema is stable and has been on lasix. No recent gout.   Past Medical History:  Diagnosis Date  . Allergy   . CAD (coronary artery disease)    see PSH section for details  . Carotid bruit 05/28/10   Tortuosity of carotids on doppler u/s, no stenosis.  . Cataract   . Chronic diastolic heart failure (HCC)    due to HOCM  . Colon cancer screening    cologuard POSITIVE 09/2017-->colonoscopy 12/19/17; multiple adenomatous polyps, recall 3 yrs.  Marland Kitchen COPD (chronic obstructive pulmonary disease) ([place])   . Diabetes mellitus    type 2- non insulin-requiring  . DOE (dyspnea on exertion)    Cardiology w/u unrevealing except LVOT obstruction: suspect dyspnea due to HOCM, morbid obesity and OSA--stable as of 02/2017 cardiol f/u.  Marland Kitchen Fatty liver u/s and CT 2009   w/mildly elevated transaminases; u/s 12/2012 reconfirmed dx.  Hep B and C testing negative.  Marland Kitchen GERD (gastroesophageal reflux disease)    EDG 04-03-01, + distal esophageal stricture dilation  . Gout   . Heart murmur   . Hiatal hernia   . Hx of adenomatous colonic polyps 12/27/2017  . Hyperlipidemia   . Hypertension    severe LVH on echo  . Hypertrophic obstructive cardiomyopathy (HOCM) ([place]) 2012   LVOT obstruction stable on echo 01/09/12 and again on f/u echo 09/29/14--with evidence of HOCM due to chordal systolic anterior motion of MV  leaflet.   . Moderate persistent asthma 04/19/2014  . Morbid obesity ([place])   . OSA on CPAP    Could not tolerate CPAP, not surgical candidate per Dr. Benjamine Mola.  Restarted CPAP (auto titrate 5-15 with full facemask--Dr. Alva 2017.  Getting mask fitting/leak issues worked out as of 07/2016 pulm f/u.  . Osteoarthritis, multiple sites    primarily knees and back.  . Stricture and stenosis of esophagus   . Venous insufficiency of both lower extremities     Past Surgical  History:  Procedure Laterality Date  . CARDIOVASCULAR STRESS TEST  2006; 04/27/10; 10/2017   Normal stress nuclear study, normal EF.  2019: EF 40%, inferior ischemia--intermediate risk study-->Dr. Einar Gip to do cath.  . COLONOSCOPY  x 3   Most recent-->12/19/17 (after + cologuard) multiple adenomatous polyps-->recall 3 yrs.  Marland Kitchen LEFT HEART CATH AND CORONARY ANGIOGRAPHY N/A 01/06/2018   One area of 80% ostial stenosis--small and hard to visualize. No intervention.  DAPT for >1 yr recommended.  Procedure: LEFT HEART CATH AND CORONARY ANGIOGRAPHY;  Surgeon: Adrian Prows, MD;  Location: [facility];  Service: Cardiovascular;  Laterality: N/A;  . NASAL SINUS SURGERY    . Sleep study  07/2016   OSA; CPAP trial at 10 cm H20 recommended by pulm (Dr. Tennis Must Dios--[place] pulm).  . [place]   ? Discectomy  . TEE WITHOUT CARDIOVERSION  05/2010   Dr. Einar Gip; severe LVH, no valvular abnormalities  . TRANSESOPHAGEAL ECHOCARDIOGRAM  06/06/10; 12/2011; 09/2014; 11/2016   LVOT obst with 135 mm Hg PG.  Not HOCM.   No mitral regurg..  Repeat echo 01/09/12 no change.  2016: EF 60%, severe conc LV hypertrophy, normal global wall motion, LA mildly dilated, systolic anterior motion of mitral valve chord, mild MR, mild TR, mild pulm HTN--no signif change since 2013.  Repeat echo 11/2016 no signif change compared to prior studies.  . TRANSTHORACIC ECHOCARDIOGRAM  05/28/2010; 10/2017   2012; Normal LVEF, LVOT obstruction, lateral wall hypokinesis.  10/2017: EF 55%, hypertrophic CM, grd II dd, normal global wall motion.    Social History   Socioeconomic History  . Marital status: Married    Spouse name: Not on file  . Number of children: 1  . Years of education: Not on file  . Highest education level: Not on file  Occupational History  . Occupation: retired    Fish farm manager: RETIRED  Social Needs  . Financial resource strain: Not on file  . Food insecurity:    Worry: Not on file    Inability: Not on file  .  Transportation needs:    Medical: Not on file    Non-medical: Not on file  Tobacco Use  . Smoking status: Former Smoker    Packs/day: 0.50    Years: 40.00    Pack years: 20.00    Types: Cigarettes, Cigars    Last attempt to quit: 04/29/1998    Years since quitting: 20.3  . Smokeless tobacco: Never Used  . Tobacco comment: 4 cigars  Substance and Sexual Activity  . Alcohol use: No    Comment: occasionally  . Drug use: No  . Sexual activity: Not on file  Lifestyle  . Physical activity:    Days per week: Not on file    Minutes per session: Not on file  . Stress: Not on file  Relationships  . Social connections:    Talks on phone: Not on file    Gets together: Not on file  Attends religious service: Not on file    Active member of club or organization: Not on file    Attends meetings of clubs or organizations: Not on file    Relationship status: Not on file  . Intimate partner violence:    Fear of current or ex partner: Not on file    Emotionally abused: Not on file    Physically abused: Not on file    Forced sexual activity: Not on file  Other Topics Concern  . Not on file  Social History Narrative   Married, 1 son, 1 granddaughter.   Retired from [employer] 2000.   Hobbies: trading farm equipment.  Used to be a Environmental manager.   Tobacco x many years, quit @ 2005   No ETOH or drugs.   No regular exercise.Smoking Status:  quit             Current Outpatient Medications on File Prior to Visit  Medication Sig Dispense Refill  . albuterol (PROAIR HFA) 108 (90 Base) MCG/ACT inhaler INHALE 2 PUFFS INTO THE LUNGS EVERY 4 HOURS AS NEEDED FOR WHEEZING 8.5 g 0  . allopurinol (ZYLOPRIM) 300 MG tablet TAKE 1 TABLET BY MOUTH TWO  TIMES DAILY 180 tablet 3  . aspirin 81 MG tablet Take 81 mg by mouth daily.     . canagliflozin (INVOKANA) 100 MG TABS tablet Take 1 tablet (100 mg total) by mouth daily before breakfast. Take 100 mg po daily 60 tablet 6  .  chlorthalidone (HYGROTON) 25 MG tablet TAKE 1 TABLET BY MOUTH EVERY MORNING 90 tablet 1  . Cholecalciferol (VITAMIN D) 2000 UNITS tablet Take 2,000 Units by mouth daily.    Marland Kitchen docusate sodium (COLACE) 100 MG capsule Take 100 mg by mouth daily.    . fluticasone (FLONASE) 50 MCG/ACT nasal spray Place 2 sprays into both nostrils daily as needed for allergies. 48 g 1  . glipiZIDE (GLUCOTROL XL) 10 MG 24 hr tablet Take 1 tablet (10 mg total) by mouth daily. OFFICE VISIT NEEDED 90 tablet 0  . glucose blood (ONE TOUCH ULTRA TEST) test strip Use as instructed to check blood sugar.  DX 250.00 100 each prn  . metFORMIN (GLUCOPHAGE) 1000 MG tablet Take 1 tablet (1,000 mg total) by mouth 2 (two) times daily with a meal. OFFICE VISIT NEEDED 180 tablet 0  . metoprolol (LOPRESSOR) 50 MG tablet Take 1 tablet (50 mg total) by mouth 2 (two) times daily. As per Dr. Einar Gip 180 tablet 4  . mometasone-formoterol (DULERA) 200-5 MCG/ACT AERO Inhale 2 puffs into the lungs 2 (two) times daily. (Patient taking differently: Inhale 2 puffs into the lungs daily as needed for wheezing or shortness of breath. ) 1 Inhaler 0  . ONE TOUCH LANCETS MISC Use as directed to check blood sugar.  DX 250.00 200 each prn  . pantoprazole (PROTONIX) 40 MG tablet TAKE 1 TABLET BY MOUTH  DAILY 90 tablet 1  . rosuvastatin (CRESTOR) 20 MG tablet Take 1 tablet (20 mg total) by mouth at bedtime. OFFICE VISIT NEEDED 90 tablet 0  . verapamil (VERELAN PM) 120 MG 24 hr capsule Take 1 capsule by mouth daily.     No current facility-administered medications on file prior to visit.     Review of Systems  Constitution: Negative for chills, decreased appetite, malaise/fatigue and weight gain.  Cardiovascular: Positive for dyspnea on exertion (stable) and leg swelling (stable). Negative for syncope.  Respiratory: Positive for sleep disturbances due to breathing (  sleep apnea on CPAP) and snoring.   Endocrine: Negative for cold intolerance.   Hematologic/Lymphatic: Does not bruise/bleed easily.  Musculoskeletal: Negative for joint swelling.  Gastrointestinal: Negative for abdominal pain, anorexia and change in bowel habit.  Neurological: Negative for headaches and light-headedness.  Psychiatric/Behavioral: Negative for depression and substance abuse.  All other systems reviewed and are negative.     Objective  Blood pressure 138/73, pulse 79, temperature 98 F (36.7 C), height 5\' 9"  (1.753 m), weight 250 lb 3.2 oz (113.5 kg). Body mass index is 36.95 kg/m.   Physical exam not performed or limited due to virtual visit. Please see exam details from prior visit is as below.    Physical Exam  Constitutional: He appears well-developed. No distress.  Morbidly obese  HENT:  Head: Atraumatic.  Eyes: Conjunctivae are normal.  Neck: Neck supple. No thyromegaly present.  Short neck and difficult to evaluate JVP  Cardiovascular: Normal rate and regular rhythm. Exam reveals no gallop.  Murmur heard.  Harsh midsystolic murmur is present at the upper right sternal border radiating to the neck. Pulses:      Carotid pulses are 2+ on the right side and 2+ on the left side.      Dorsalis pedis pulses are 2+ on the right side and 2+ on the left side.       Posterior tibial pulses are 2+ on the right side and 2+ on the left side.  Femoral and popliteal pulse difficult to feel due to patient's body habitus.  2+ pitting leg edema bilateral.  Pulmonary/Chest: Effort normal and breath sounds normal.  Abdominal: Soft. Bowel sounds are normal.  Obese. Pannus present  Musculoskeletal: Normal range of motion.  Neurological: He is alert.  Skin: Skin is warm and dry.  Psychiatric: He has a normal mood and affect.   Radiology: No results found.  Laboratory examination:    CMP Latest Ref Rng & Units 07/08/2018 03/06/2018 06/02/2017  Glucose 70 - 99 mg/dL 138(H) 117(H) 142(H)  BUN 6 - 23 mg/dL 18 13 19   Creatinine 0.40 - 1.50 mg/dL 1.01 1.00  0.94  Sodium 135 - 145 mEq/L 140 142 142  Potassium 3.5 - 5.1 mEq/L 3.8 4.4 4.5  Chloride 96 - 112 mEq/L 102 109 108  CO2 19 - 32 mEq/L 30 27 27   Calcium 8.4 - 10.5 mg/dL 9.7 9.4 9.3  Total Protein 6.0 - 8.3 g/dL - 6.6 -  Total Bilirubin 0.2 - 1.2 mg/dL - 0.8 -  Alkaline Phos 39 - 117 U/L - 51 -  AST 0 - 37 U/L - 26 -  ALT 0 - 53 U/L - 19 -   Lipid Panel     Component Value Date/Time   CHOL 83 03/06/2018 0959   TRIG 105.0 03/06/2018 0959   HDL 32.70 (L) 03/06/2018 0959   CHOLHDL 3 03/06/2018 0959   VLDL 21.0 03/06/2018 0959   LDLCALC 29 03/06/2018 0959   HEMOGLOBIN A1C Lab Results  Component Value Date   HGBA1C 7.2 (H) 07/08/2018    TSH No results for input(s): TSH in the last 8760 hours.  Cardiac Studies:   Coronary angiogram 01/06/2018:  Mid LAD 20% stenosis, otherwise no significant disease. Normal LVEF. 140 mmHg intraventricular PG noted. LVEDP moderate to severely elevated.  Echocardiogram  11/12/2017: Left ventricle cavity is normal in size. Severe concentric hypertrophy of the left ventricle. Posterior and septal walls both measure 2 cm in AP dimension. Normal global wall motion. Doppler evidence of grade II (  pseudonormal) diastolic dysfunction, elevated LAP. Calculated EF 55%. Hypertrophic cardiomyopathy. Left atrial cavity is mildly dilated, measuring 4.6 cm in AP dimension. Inadequate tricuspid regurgitation jet to estimate pulmonary artery pressure. Normal right atrial pressure. No significant change compared to prior study dated 12/17/2016.  Nuclear stress test  11/10/2017: 1. Lexiscan stress test was performed. Exercise capacity was not assessed. Stress symptoms included dyspnea. Peak blood pressure was 150/82 mmHg. The resting and stress electrocardiogram demonstrated normal sinus rhythm, normal resting conduction, frequent multifocal PAC;s, lateral T wave inversions. Stress EKG is non diagnostic for ischemia as it is a pharmacologic stress. 2. The overall  quality of the study is good. Left ventricular cavity is noted to be normal on the rest and stress studies. Gated SPECT images reveal normal myocardial thickening and wall motion. The left ventricular ejection fraction was calculated to be 40%, although visually looks normal. SPECT images demonstrate small perfusion abnormality of mild intensity in the basal inferior and mid inferior myocardial wall(s) on the stress images with mild reversibility. 3. Intermediate risk study.  Carotid Doppler [10/11/2013]: No evidence of hemodynamically significant stenosis in the bilateral carotid bifurcation vessels, Tortuosity may be the source of bruit. No significant change from 05/28/2010.  TEE 06/06/10: Normal LVEF. Mod LVH, chordal SAM & LVOT obst with 163mm Hg PG. Not HOCM. No significant mitral regurgitation.    Assessment   Hypertrophic obstructive cardiomyopathy (HOCM) (HCC)  Pure hypercholesterolemia  Class 3 severe obesity due to excess calories with serious comorbidity and body mass index (BMI) of 40.0 to 44.9 in adult [facility])  Essential hypertension  EKG- 09/29/2017 - Sinus rhythm at 70 bpm with left atrial abnormality, frequent PACs and frequent nonconducted PAC, LVH with repolarization abnormality, nonspecific ST-T wave abnormality. Compared to EKG 11/18/2016, PAC are new.  Recommendations:   Patient is seen on a six-month office visit, this is a virtual visit.  He has lost about 8-10 pounds in weight and now weighs 250 pounds.  Encouraged him to continue to lose more.  Dyspnea has remained stable, leg edema is also improved slightly.  He has started to use CPAP on a regular basis now.  He feels his energy level has improved since being on CPAP.  Encouraged him to continue this.  Blood pressure is well controlled, I did review his recently performed labs, lipids are also well controlled and diabetes is getting better.  He has not had a TSH was the past 4-5 years, would be appropriate to check  his TSH on his next complete physical examination.  Otherwise stable from cardiac standpoint, I'll see him back in 6 months.  If symptoms of dyspnea continued to get worse, we may have to consider myomectomy.  Adrian Prows, MD, [facility] 09/04/2018, 10:34 AM [facility] [place] Pager: [phone] Office: [phone] If no answer Cell [phone]

## 2018-09-10 DIAGNOSIS — E119 Type 2 diabetes mellitus without complications: Secondary | ICD-10-CM | POA: Diagnosis not present

## 2018-09-10 DIAGNOSIS — H5789 Other specified disorders of eye and adnexa: Secondary | ICD-10-CM | POA: Diagnosis not present

## 2018-09-10 DIAGNOSIS — Z7984 Long term (current) use of oral hypoglycemic drugs: Secondary | ICD-10-CM | POA: Diagnosis not present

## 2018-09-14 ENCOUNTER — Other Ambulatory Visit: Payer: Self-pay | Admitting: Family Medicine

## 2018-09-18 ENCOUNTER — Encounter: Payer: Self-pay | Admitting: Family Medicine

## 2018-09-22 DIAGNOSIS — H903 Sensorineural hearing loss, bilateral: Secondary | ICD-10-CM | POA: Diagnosis not present

## 2018-11-03 ENCOUNTER — Other Ambulatory Visit: Payer: Self-pay | Admitting: Cardiology

## 2018-12-01 ENCOUNTER — Other Ambulatory Visit: Payer: Self-pay

## 2018-12-01 ENCOUNTER — Telehealth: Payer: Self-pay

## 2018-12-01 DIAGNOSIS — E119 Type 2 diabetes mellitus without complications: Secondary | ICD-10-CM | POA: Diagnosis not present

## 2018-12-01 NOTE — Telephone Encounter (Signed)
Copied from [place] [id number]. Topic: General - Other >> Dec 01, 2018  3:28 PM Yvette Rack wrote: Reason for CRM: Jack [name] with [facility] [address] stated pt is requesting that the Rx for canagliflozin (INVOKANA) 100 MG TABS tablet be changed to another medication because he can no longer afford it.   Please advise

## 2018-12-01 NOTE — Patient Outreach (Signed)
[facility]) Care Management  12/01/2018  Jack [name] Jul 05, 1945 [id number]   Medication Adherence call to Jack [name] [facility] Identifiers Verify spoke with patient he is past due on Losartan 100 mg pt explain he has medication at this time he explain at the beginning of the year the pharmacy duplicate the order and end up with more patient will order when due.patient did ask for help on Invokana 100 mg patient explain medication it's too expensive and can not afford to pay any longer patient ask if we can call doctors office to see if he can change it to something else,left  Message at doctor office they will call back.Jack [name] is showing past due under [facility].   Jack [name] Management Direct Dial [phone]  Fax [phone] [email or web address]

## 2018-12-02 NOTE — Telephone Encounter (Signed)
Called and informed pt he will call insurance and verify which is preferred and call back to the office.

## 2018-12-02 NOTE — Telephone Encounter (Signed)
Called pt to inform. There was no voicemail. Will try calling back later.   [place] for [facility] to Discuss results / PCP recommendations / Schedule patient.

## 2018-12-02 NOTE — Telephone Encounter (Signed)
The only med I could replace this with is another in the same class (jardiance or farxiga). If his insurer covers one of these then I will rx.  He has to check with his insurer about coverage for these. -thx

## 2018-12-04 NOTE — Telephone Encounter (Signed)
Received forms from [facility] my [employer]s for possible patient assistance to help pay for Canagliflozin.  PCP will review and sign, if appropriate.

## 2018-12-04 NOTE — Telephone Encounter (Signed)
Completed paperwork

## 2018-12-14 ENCOUNTER — Telehealth: Payer: Self-pay

## 2018-12-14 NOTE — Telephone Encounter (Signed)
Signed and put in box to go up front.  

## 2018-12-14 NOTE — Telephone Encounter (Signed)
Received form for pt assistance for Invokana. PCP will review and sign, if appropriate.  Pt will come pick up when completed.

## 2018-12-26 IMAGING — DX DG CHEST 2V
2 series · 2 of 2 positions shown · non-contrast
Comparison: Chest x-ray of August 31, 2013

CLINICAL DATA: Productive cough, shortness of breath, and wheezing
for the past 10 days. Abnormal breath sounds on the left. History of
asthma, bronchitis, former smoker.

EXAM:
CHEST  2 VIEW

[chest pa]
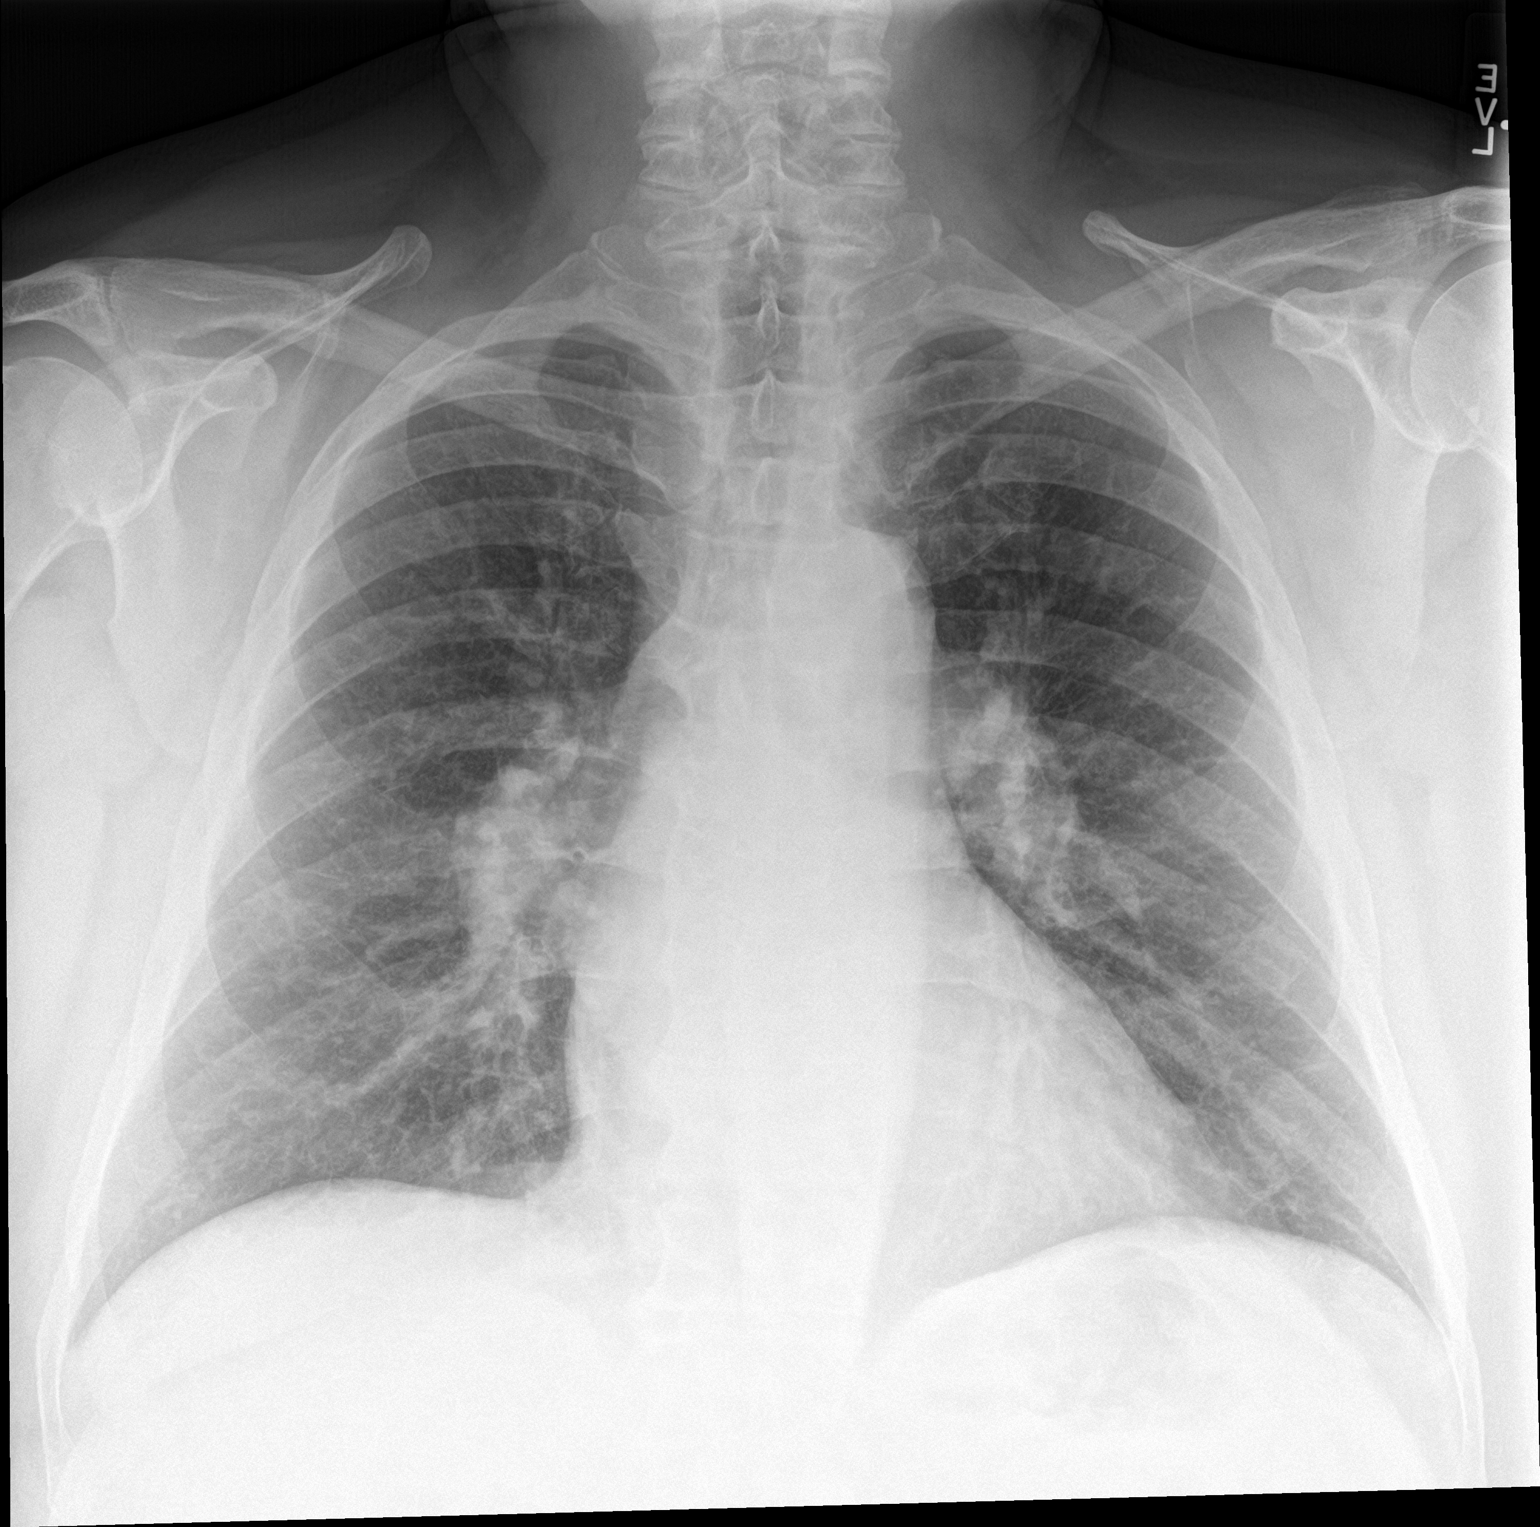

[chest lat]
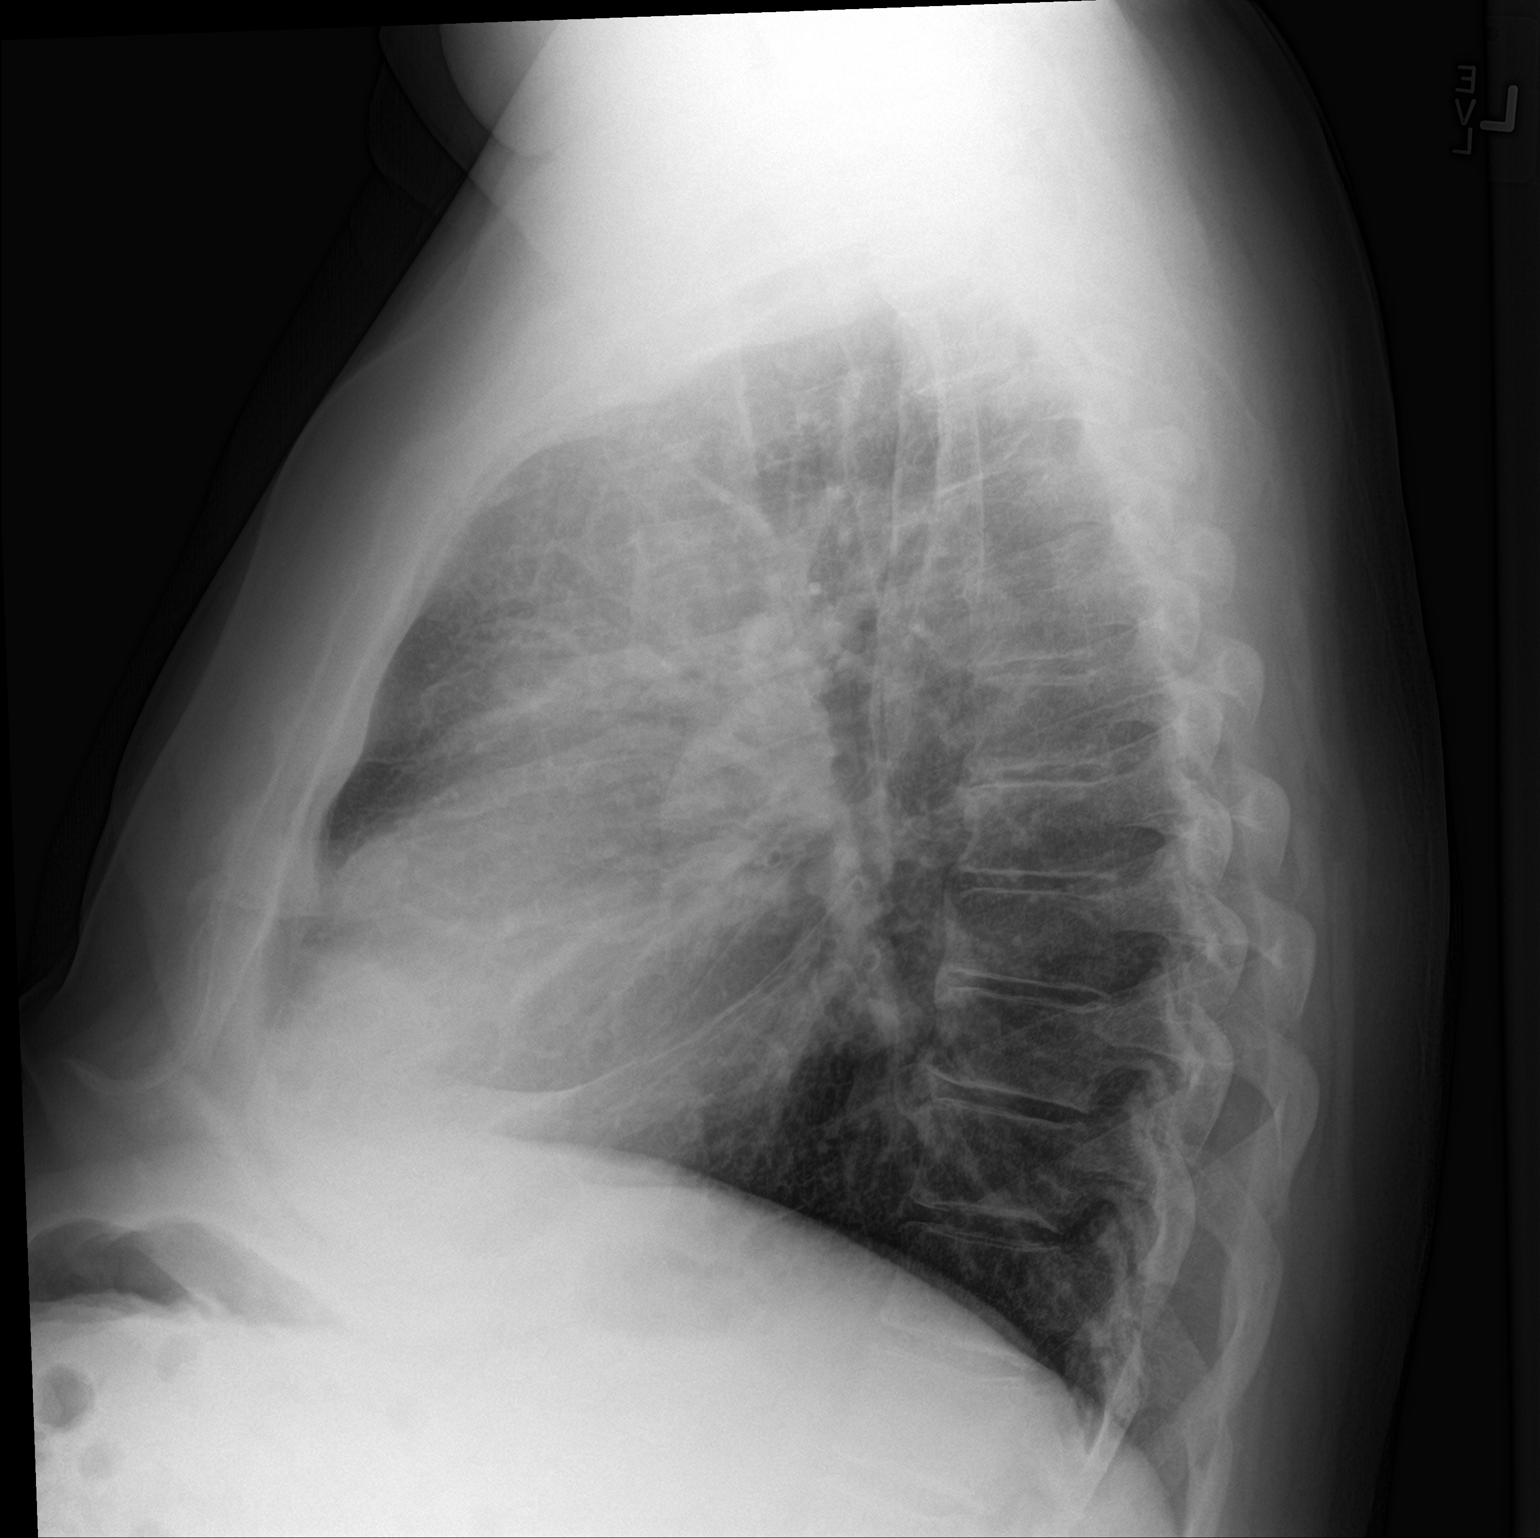

[2 of 2 positions shown; findings below may reference images not displayed]

FINDINGS: The lungs are well-expanded. The interstitial markings are coarse
bilaterally and slightly more conspicuous today. There is no
alveolar infiltrate. There is no pleural effusion. The heart is
normal in size. The hilar structures are prominent but stable. The
bony thorax exhibits no acute abnormality.
IMPRESSION: Mild chronic bronchitic changes. Possible superimposed acute
bronchitis. No alveolar pneumonia nor CHF.

## 2019-01-01 LAB — MICROALBUMIN, URINE

## 2019-01-21 ENCOUNTER — Encounter: Payer: Self-pay | Admitting: Family Medicine

## 2019-02-04 ENCOUNTER — Other Ambulatory Visit: Payer: Self-pay

## 2019-02-04 ENCOUNTER — Telehealth: Payer: Self-pay | Admitting: Family Medicine

## 2019-02-04 MED ORDER — CANAGLIFLOZIN 100 MG PO TABS: 100.0000 mg | ORAL_TABLET | ORAL | 2 refills | Status: DC

## 2019-02-04 NOTE — Telephone Encounter (Signed)
Patient's wife notified Rx sent, scheduled CPE appt for next month.

## 2019-02-04 NOTE — Telephone Encounter (Signed)
Rx sent to [facility].

## 2019-02-04 NOTE — Telephone Encounter (Signed)
Needs a new prescription for Invokano.  Patient has approved for their prescription refill program to assist him with cost of drug. They are requiring new prescription to be sent to [facility]  Please call patient with any questions

## 2019-02-13 ENCOUNTER — Other Ambulatory Visit: Payer: Self-pay | Admitting: Family Medicine

## 2019-02-28 DIAGNOSIS — Z862 Personal history of diseases of the blood and blood-forming organs and certain disorders involving the immune mechanism: Secondary | ICD-10-CM

## 2019-02-28 DIAGNOSIS — D509 Iron deficiency anemia, unspecified: Secondary | ICD-10-CM

## 2019-02-28 HISTORY — DX: Personal history of diseases of the blood and blood-forming organs and certain disorders involving the immune mechanism: Z86.2

## 2019-02-28 HISTORY — DX: Iron deficiency anemia, unspecified: D50.9

## 2019-03-05 ENCOUNTER — Other Ambulatory Visit: Payer: Self-pay

## 2019-03-05 ENCOUNTER — Encounter: Payer: Self-pay | Admitting: Family Medicine

## 2019-03-05 ENCOUNTER — Ambulatory Visit (INDEPENDENT_AMBULATORY_CARE_PROVIDER_SITE_OTHER): Payer: Medicare Other | Admitting: Family Medicine

## 2019-03-05 VITALS — BP 127/72 | HR 62 | Temp 98.3°F | Resp 16 | Ht 69.0 in | Wt 251.8 lb

## 2019-03-05 DIAGNOSIS — Z0000 Encounter for general adult medical examination without abnormal findings: Secondary | ICD-10-CM | POA: Diagnosis not present

## 2019-03-05 DIAGNOSIS — I10 Essential (primary) hypertension: Secondary | ICD-10-CM | POA: Diagnosis not present

## 2019-03-05 DIAGNOSIS — E119 Type 2 diabetes mellitus without complications: Secondary | ICD-10-CM | POA: Diagnosis not present

## 2019-03-05 LAB — LIPID PANEL
Cholesterol: 93 mg/dL (ref 0–200)
HDL: 32.5 mg/dL — ABNORMAL LOW (ref >39.00)
LDL Cholesterol: 31 mg/dL (ref 0–99)
NonHDL: 60.54
Total CHOL/HDL Ratio: 3
Triglycerides: 146 mg/dL (ref 0.0–149.0)
VLDL: 29.2 mg/dL (ref 0.0–40.0)

## 2019-03-05 LAB — COMPREHENSIVE METABOLIC PANEL
ALT: 20 U/L (ref 0–53)
AST: 28 U/L (ref 0–37)
Albumin: 4.6 g/dL (ref 3.5–5.2)
Alkaline Phosphatase: 60 U/L (ref 39–117)
BUN: 22 mg/dL (ref 6–23)
CO2: 26 mEq/L (ref 19–32)
Calcium: 9.7 mg/dL (ref 8.4–10.5)
Chloride: 102 mEq/L (ref 96–112)
Creatinine, Ser: 1.07 mg/dL (ref 0.40–1.50)
GFR: 67.92 mL/min (ref >60.00)
Glucose, Bld: 139 mg/dL — ABNORMAL HIGH (ref 70–99)
Potassium: 4 mEq/L (ref 3.5–5.1)
Sodium: 140 mEq/L (ref 135–145)
Total Bilirubin: 1 mg/dL (ref 0.2–1.2)
Total Protein: 6.8 g/dL (ref 6.0–8.3)

## 2019-03-05 LAB — CBC WITH DIFFERENTIAL/PLATELET
Basophils Absolute: 0.2 10*3/uL — ABNORMAL HIGH (ref 0.0–0.1)
Basophils Relative: 2.1 % (ref 0.0–3.0)
Eosinophils Absolute: 0.3 10*3/uL (ref 0.0–0.7)
Eosinophils Relative: 3.7 % (ref 0.0–5.0)
HCT: 41.9 % (ref 39.0–52.0)
Hemoglobin: 12.9 g/dL — ABNORMAL LOW (ref 13.0–17.0)
Lymphocytes Relative: 28.7 % (ref 12.0–46.0)
Lymphs Abs: 2.6 10*3/uL (ref 0.7–4.0)
MCHC: 30.8 g/dL (ref 30.0–36.0)
MCV: 71.2 fl — ABNORMAL LOW (ref 78.0–100.0)
Monocytes Absolute: 0.8 10*3/uL (ref 0.1–1.0)
Monocytes Relative: 8.9 % (ref 3.0–12.0)
Neutro Abs: 5.2 10*3/uL (ref 1.4–7.7)
Neutrophils Relative %: 56.6 % (ref 43.0–77.0)
Platelets: 191 10*3/uL (ref 150.0–400.0)
RBC: 5.89 Mil/uL — ABNORMAL HIGH (ref 4.22–5.81)
RDW: 20.1 % — ABNORMAL HIGH (ref 11.5–15.5)
WBC: 9.2 10*3/uL (ref 4.0–10.5)

## 2019-03-05 LAB — HEMOGLOBIN A1C: Hgb A1c MFr Bld: 7.6 % — ABNORMAL HIGH (ref 4.6–6.5)

## 2019-03-05 LAB — TSH: TSH: 1.89 u[IU]/mL (ref 0.35–4.50)

## 2019-03-05 NOTE — Patient Instructions (Signed)

## 2019-03-05 NOTE — Progress Notes (Signed)
Office Note 03/05/2019  CC:  Chief Complaint  Patient presents with  . Annual Exam    pt is fasting   HPI:  Jack [name] is a 73 y.o. White male who is here for annual health maintenance exam.  DM: no burning, tingling, or numbness in feet. Glucose low to mid 100s.  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   Diet: "I eat anything but watch portion sizes".  Past Medical History:  Diagnosis Date  . Allergy   . CAD (coronary artery disease)    see PSH section for details  . Carotid bruit 05/28/10   Tortuosity of carotids on doppler u/s, no stenosis.  . Cataract   . Chronic diastolic heart failure (HCC)    Grd II DD  . Colon cancer screening    cologuard POSITIVE 09/2017-->colonoscopy 12/19/17; multiple adenomatous polyps, recall 3 yrs.  Marland Kitchen COPD (chronic obstructive pulmonary disease) ([place])   . Diabetes mellitus    type 2- non insulin-requiring  . DOE (dyspnea on exertion)    Cardiology w/u unrevealing except LVOT obstruction: suspect dyspnea due to HOCM, morbid obesity and OSA--stable as of 02/2017 cardiol f/u.  Marland Kitchen Fatty liver u/s and CT 2009   w/mildly elevated transaminases; u/s 12/2012 reconfirmed dx.  Hep B and C testing negative.  Marland Kitchen GERD (gastroesophageal reflux disease)    EDG 04-03-01, + distal esophageal stricture dilation  . Gout   . Heart murmur   . Hiatal hernia   . Hx of adenomatous colonic polyps 12/27/2017  . Hyperlipidemia   . Hypertension    severe LVH on echo  . Hypertrophic obstructive cardiomyopathy (HOCM) ([place]) 2012   LVOT obstruction stable on echo 01/09/12 and again on f/u echo 09/29/14, 11/2016, 10/2017--with evidence of HOCM due to chordal systolic anterior motion of MV leaflet. Grd II DD.    . Moderate persistent asthma 04/19/2014  . Morbid obesity ([place])   . OSA on CPAP    Could not tolerate CPAP, not surgical candidate per Dr. Benjamine Mola.  Restarted CPAP (auto titrate 5-15 with full facemask--Dr. Alva 2017.  Getting mask fitting/leak issues worked out  as of 07/2016 pulm f/u.  . Osteoarthritis, multiple sites    primarily knees and back.  . Stricture and stenosis of esophagus   . Venous insufficiency of both lower extremities     Past Surgical History:  Procedure Laterality Date  . CARDIOVASCULAR STRESS TEST  2006; 04/27/10; 10/2017   Normal stress nuclear study, normal EF.  2019: EF 40%, inferior ischemia--intermediate risk study-->Dr. Einar Gip to do cath.  . COLONOSCOPY  x 3   Most recent-->12/19/17 (after + cologuard) multiple adenomatous polyps-->recall 3 yrs.  Marland Kitchen LEFT HEART CATH AND CORONARY ANGIOGRAPHY N/A 01/06/2018   One area of 80% ostial stenosis--small and hard to visualize. No intervention.  DAPT for >1 yr recommended.  Procedure: LEFT HEART CATH AND CORONARY ANGIOGRAPHY;  Surgeon: Adrian Prows, MD;  Location: [facility];  Service: Cardiovascular;  Laterality: N/A;  . NASAL SINUS SURGERY    . Sleep study  07/2016   OSA; CPAP trial at 10 cm H20 recommended by pulm (Dr. Tennis Must Dios--[place] pulm).  . [place]   ? Discectomy  . TEE WITHOUT CARDIOVERSION  05/2010   Dr. Einar Gip; severe LVH, no valvular abnormalities  . TRANSESOPHAGEAL ECHOCARDIOGRAM  06/06/10; 12/2011; 09/2014; 11/2016   LVOT obst with 135 mm Hg PG. HOCM. No AV obstruction to flow. No mitral regurg..  Repeat echo 01/09/12 no change.  2016: EF 60%, severe conc LV hypertrophy, normal global wall motion, LA mildly dilated, systolic anterior motion of mitral valve chord, mild MR, mild TR, mild pulm HTN--no signif change since 2013.  Repeat echo 11/2016 and 10/2017 no signif change compared to prior studies.  . TRANSTHORACIC ECHOCARDIOGRAM  05/28/2010; 10/2017   2012; Normal LVEF, LVOT obstruction, lateral wall hypokinesis.  10/2017: EF 55%, hypertrophic CM, grd II dd, normal global wall motion.    Family History  Problem Relation Age of Onset  . Diabetes Mother   . Prostate cancer Father        Prostate  . Breast cancer Sister   . Diabetes Maternal Aunt   . Breast  cancer Maternal Aunt   . Hyperlipidemia Sister   . Hypertension Neg Hx   . Coronary artery disease Neg Hx   . Colon cancer Neg Hx   . Stomach cancer Neg Hx   . Rectal cancer Neg Hx   . Esophageal cancer Neg Hx     Social History   Socioeconomic History  . Marital status: Married    Spouse name: Not on file  . Number of children: 1  . Years of education: Not on file  . Highest education level: Not on file  Occupational History  . Occupation: retired    Fish farm manager: RETIRED  Social Needs  . Financial resource strain: Not on file  . Food insecurity    Worry: Not on file    Inability: Not on file  . Transportation needs    Medical: Not on file    Non-medical: Not on file  Tobacco Use  . Smoking status: Former Smoker    Packs/day: 0.50    Years: 40.00    Pack years: 20.00    Types: Cigarettes, Cigars    Quit date: 04/29/1998    Years since quitting: 20.8  . Smokeless tobacco: Never Used  . Tobacco comment: 4 cigars  Substance and Sexual Activity  . Alcohol use: No    Comment: occasionally  . Drug use: No  . Sexual activity: Not on file  Lifestyle  . Physical activity    Days per week: Not on file    Minutes per session: Not on file  . Stress: Not on file  Relationships  . Social Herbalist on phone: Not on file    Gets together: Not on file    Attends religious service: Not on file    Active member of club or organization: Not on file    Attends meetings of clubs or organizations: Not on file    Relationship status: Not on file  . Intimate partner violence    Fear of current or ex partner: Not on file    Emotionally abused: Not on file    Physically abused: Not on file    Forced sexual activity: Not on file  Other Topics Concern  . Not on file  Social History Narrative   Married, 1 son, 1 granddaughter.   Retired from [employer] 2000.   Hobbies: trading farm equipment.  Used to be a Environmental manager.   Tobacco x many years,  quit @ 2005   No ETOH or drugs.   No regular exercise.Smoking Status:  quit             Outpatient Medications Prior to Visit  Medication Sig Dispense Refill  . albuterol (PROAIR HFA) 108 (90 Base) MCG/ACT inhaler INHALE 2 PUFFS INTO THE LUNGS EVERY 4 HOURS  AS NEEDED FOR WHEEZING 8.5 g 0  . allopurinol (ZYLOPRIM) 300 MG tablet TAKE 1 TABLET BY MOUTH TWO  TIMES DAILY 180 tablet 3  . aspirin 81 MG tablet Take 81 mg by mouth daily.     . canagliflozin (INVOKANA) 100 MG TABS tablet Take 1 tablet (100 mg total) by mouth 2 (two) times a week. Take 200 mg po daily (Patient taking differently: Take 100 mg by mouth daily. Take 100mg  po daily.) 60 tablet 2  . chlorthalidone (HYGROTON) 25 MG tablet TAKE 1 TABLET BY MOUTH EVERY MORNING 90 tablet 1  . Cholecalciferol (VITAMIN D) 2000 UNITS tablet Take 2,000 Units by mouth daily.    Marland Kitchen docusate sodium (COLACE) 100 MG capsule Take 100 mg by mouth daily.    . fluticasone (FLONASE) 50 MCG/ACT nasal spray Place 2 sprays into both nostrils daily as needed for allergies. 48 g 1  . glipiZIDE (GLUCOTROL XL) 10 MG 24 hr tablet TAKE 1 TABLET BY MOUTH  DAILY 90 tablet 3  . glucose blood (ONE TOUCH ULTRA TEST) test strip Use as instructed to check blood sugar.  DX 250.00 100 each prn  . metFORMIN (GLUCOPHAGE) 1000 MG tablet TAKE 1 TABLET BY MOUTH  TWICE DAILY WITH MEALS 180 tablet 3  . metoprolol (LOPRESSOR) 50 MG tablet Take 1 tablet (50 mg total) by mouth 2 (two) times daily. As per Dr. Einar Gip 180 tablet 4  . mometasone-formoterol (DULERA) 200-5 MCG/ACT AERO Inhale 2 puffs into the lungs 2 (two) times daily. (Patient taking differently: Inhale 2 puffs into the lungs daily as needed for wheezing or shortness of breath. ) 1 Inhaler 0  . ONE TOUCH LANCETS MISC Use as directed to check blood sugar.  DX 250.00 200 each prn  . pantoprazole (PROTONIX) 40 MG tablet TAKE 1 TABLET BY MOUTH  DAILY 90 tablet 1  . rosuvastatin (CRESTOR) 20 MG tablet TAKE 1 TABLET BY MOUTH AT   BEDTIME 90 tablet 3  . verapamil (VERELAN PM) 120 MG 24 hr capsule Take 1 capsule by mouth daily.     No facility-administered medications prior to visit.     No Known Allergies  ROS Review of Systems  Constitutional: Negative for appetite change, chills, fatigue and fever.  HENT: Negative for congestion, dental problem, ear pain and sore throat.   Eyes: Negative for discharge, redness and visual disturbance.  Respiratory: Negative for cough, chest tightness, shortness of breath and wheezing.   Cardiovascular: Negative for chest pain, palpitations and leg swelling.  Gastrointestinal: Negative for abdominal pain, blood in stool, diarrhea, nausea and vomiting.  Genitourinary: Negative for difficulty urinating, dysuria, flank pain, frequency, hematuria and urgency.  Musculoskeletal: Positive for arthralgias (R knee hurting since he fell while on ladder 8-9 mo ago->he has appt to see Dr. Latanya Maudlin (ortho) soon.). Negative for back pain, joint swelling, myalgias and neck stiffness.  Skin: Negative for pallor and rash.  Neurological: Negative for dizziness, speech difficulty, weakness and headaches.  Hematological: Negative for adenopathy. Does not bruise/bleed easily.  Psychiatric/Behavioral: Negative for confusion and sleep disturbance. The patient is not nervous/anxious.    PE; Blood pressure 127/72, pulse 62, temperature 98.3 F (36.8 C), temperature source Temporal, resp. rate 16, height 5\' 9"  (1.753 m), weight 251 lb 12.8 oz (114.2 kg), SpO2 96 %. Body mass index is 37.18 kg/m.  Gen: Alert, well appearing.  Patient is oriented to person, place, time, and situation. AFFECT: pleasant, lucid thought and speech. ENT: Ears: EACs clear, normal epithelium.  TMs  with good light reflex and landmarks bilaterally.  Eyes: no injection, icteris, swelling, or exudate.  EOMI, PERRLA. Nose: no drainage or turbinate edema/swelling.  No injection or focal lesion.  Mouth: lips without lesion/swelling.   Oral mucosa pink and moist.  Dentition intact and without obvious caries or gingival swelling.  Oropharynx without erythema, exudate, or swelling.  Neck: supple/nontender.  No LAD, mass, or TM.  Carotid pulses 2+ bilaterally, without bruits. CV: RRR, no m/r/g.   LUNGS: CTA bilat, nonlabored resps, good aeration in all lung fields. ABD: soft, NT, ND, BS normal.  No hepatospenomegaly or mass.  No bruits. EXT: no clubbing, cyanosis, or edema.  Musculoskeletal: no joint swelling, erythema, warmth, or tenderness.  ROM of all joints intact. Skin - no sores or suspicious lesions or rashes or color changes Foot exam - bilateral normal; no swelling, tenderness or skin or vascular lesions. Color and temperature is normal. Sensation is intact. Peripheral pulses are palpable. Toenails are normal.   Pertinent labs:  Lab Results  Component Value Date   TSH 3.08 04/19/2014   No results found for: WBC, HGB, HCT, MCV, PLT Lab Results  Component Value Date   CREATININE 1.01 07/08/2018   BUN 18 07/08/2018   NA 140 07/08/2018   K 3.8 07/08/2018   CL 102 07/08/2018   CO2 30 07/08/2018   Lab Results  Component Value Date   ALT 19 03/06/2018   AST 26 03/06/2018   ALKPHOS 51 03/06/2018   BILITOT 0.8 03/06/2018   Lab Results  Component Value Date   CHOL 83 03/06/2018   Lab Results  Component Value Date   HDL 32.70 (L) 03/06/2018   Lab Results  Component Value Date   LDLCALC 29 03/06/2018   Lab Results  Component Value Date   TRIG 105.0 03/06/2018   Lab Results  Component Value Date   CHOLHDL 3 03/06/2018   Lab Results  Component Value Date   PSA 0.81 02/28/2017   PSA 0.70 11/28/2015   PSA 0.71 04/19/2014   Lab Results  Component Value Date   HGBA1C 7.2 (H) 07/08/2018    ASSESSMENT AND PLAN:   Health maintenance exam: Reviewed age and gender appropriate health maintenance issues (prudent diet, regular exercise, health risks of tobacco and excessive alcohol, use of seatbelts,  fire alarms in home, use of sunscreen).  Also reviewed age and gender appropriate health screening as well as vaccine recommendations. Vaccines: ALL UTD. Labs: fasting HP, HbA1c (DM). Prostate ca screening:  Decided to d/c any further prostate ca screening at this time. Colon ca screening: next colonoscopy due 11/2020.  An After Visit Summary was printed and given to the patient.  FOLLOW UP:  Return in about 6 months (around 09/02/2019) for routine chronic illness f/u.  Signed:  Crissie Sickles, MD           03/05/2019

## 2019-03-08 ENCOUNTER — Other Ambulatory Visit: Payer: Self-pay | Admitting: Family Medicine

## 2019-03-08 DIAGNOSIS — D509 Iron deficiency anemia, unspecified: Secondary | ICD-10-CM

## 2019-03-09 ENCOUNTER — Other Ambulatory Visit: Payer: Self-pay

## 2019-03-09 ENCOUNTER — Ambulatory Visit (INDEPENDENT_AMBULATORY_CARE_PROVIDER_SITE_OTHER): Payer: Medicare Other | Admitting: Family Medicine

## 2019-03-09 DIAGNOSIS — D509 Iron deficiency anemia, unspecified: Secondary | ICD-10-CM

## 2019-03-10 ENCOUNTER — Other Ambulatory Visit: Payer: Self-pay | Admitting: Family Medicine

## 2019-03-10 ENCOUNTER — Encounter: Payer: Self-pay | Admitting: Family Medicine

## 2019-03-10 DIAGNOSIS — D500 Iron deficiency anemia secondary to blood loss (chronic): Secondary | ICD-10-CM

## 2019-03-10 LAB — IRON,TIBC AND FERRITIN PANEL
%SAT: 6 % (calc) — ABNORMAL LOW (ref 20–48)
Ferritin: 11 ng/mL — ABNORMAL LOW (ref 24–380)
Iron: 25 ug/dL — ABNORMAL LOW (ref 50–180)
TIBC: 452 mcg/dL (calc) — ABNORMAL HIGH (ref 250–425)

## 2019-03-10 NOTE — Progress Notes (Signed)
Primary Physician/Referring:  Tammi Sou, MD  Patient ID: Jack [name], male    DOB: [birth year], 73 y.o.   MRN: [id number]  Chief Complaint  Patient presents with  . Cardiomyopathy  . Hypertension  . Follow-up    6 week   HPI:    Jack [name]  is a 73 y.o. 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    He is trying his best to lose weight, Has lost about 10 pounds in weight and has noticed marked improvement in dyspnea. Denies chest pain, dizziness or syncope. He has been compliant on CPAP.No recent gout.   Past Medical History:  Diagnosis Date  . Allergy   . CAD (coronary artery disease)    see PSH section for details  . Carotid bruit 05/28/10   Tortuosity of carotids on doppler u/s, no stenosis.  . Cataract   . Chronic diastolic heart failure (HCC)    Grd II DD  . Colon cancer screening    cologuard POSITIVE 09/2017-->colonoscopy 12/19/17; multiple adenomatous polyps, recall 3 yrs.  Marland Kitchen COPD (chronic obstructive pulmonary disease) ([place])   . Diabetes mellitus    type 2- non insulin-requiring  . DOE (dyspnea on exertion)    Cardiology w/u unrevealing except LVOT obstruction: suspect dyspnea due to HOCM, morbid obesity and OSA--stable as of 02/2017 cardiol f/u.  Marland Kitchen Fatty liver u/s and CT 2009   w/mildly elevated transaminases; u/s 12/2012 reconfirmed dx.  Hep B and C testing negative.  Marland Kitchen GERD (gastroesophageal reflux disease)    EDG 04-03-01, + distal esophageal stricture dilation  . Gout   . Heart murmur   . Hiatal hernia   . Hx of adenomatous colonic polyps 12/27/2017  . Hyperlipidemia   . Hypertension    severe LVH on echo  . Hypertrophic obstructive cardiomyopathy (HOCM) ([place]) 2012   LVOT obstruction stable on echo 01/09/12 and  again on f/u echo 09/29/14, 11/2016, 10/2017--with evidence of HOCM due to chordal systolic anterior motion of MV leaflet. Grd II DD.    . Iron deficiency anemia 02/2019   hemoccults pending as of 03/10/19.  GI referral done.  . Moderate persistent asthma 04/19/2014  . Morbid obesity ([place])   . OSA on CPAP    Could not tolerate CPAP, not surgical candidate per Dr. Benjamine Mola.  Restarted CPAP (auto titrate 5-15 with full facemask--Dr. Alva 2017.  Getting mask fitting/leak issues worked out as of 07/2016 pulm f/u.  . Osteoarthritis, multiple sites    primarily knees and back.  . Stricture and stenosis of esophagus   . Venous insufficiency of both lower extremities    Past Surgical History:  Procedure Laterality Date  . CARDIOVASCULAR STRESS TEST  2006; 04/27/10; 10/2017   Normal stress nuclear study, normal EF.  2019: EF 40%, inferior ischemia--intermediate risk study-->Dr. Einar Gip to do cath.  . COLONOSCOPY  x 3   Most recent-->12/19/17 (after + cologuard) multiple adenomatous polyps-->recall 3 yrs.  Marland Kitchen LEFT HEART CATH AND CORONARY ANGIOGRAPHY N/A 01/06/2018   One area of 80% ostial stenosis--small and hard to visualize. No intervention.  DAPT for >1 yr recommended.  Procedure: LEFT HEART CATH AND CORONARY ANGIOGRAPHY;  Surgeon: Adrian Prows, MD;  Location: [facility];  Service: Cardiovascular;  Laterality: N/A;  . NASAL  SINUS SURGERY    . Sleep study  07/2016   OSA; CPAP trial at 10 cm H20 recommended by pulm (Dr. Tennis Must Dios-- pulm).  . [place]   ? Discectomy  . TEE WITHOUT CARDIOVERSION  05/2010   Dr. Einar Gip; severe LVH, no valvular abnormalities  . TRANSESOPHAGEAL ECHOCARDIOGRAM  06/06/10; 12/2011; 09/2014; 11/2016   LVOT obst with 135 mm Hg PG. HOCM. No AV obstruction to flow. No mitral regurg..  Repeat echo 01/09/12 no change.  2016: EF 60%, severe conc LV hypertrophy, normal global wall motion, LA mildly dilated, systolic anterior motion of mitral valve chord, mild MR, mild TR, mild pulm  HTN--no signif change since 2013.  Repeat echo 11/2016 and 10/2017 no signif change compared to prior studies.  . TRANSTHORACIC ECHOCARDIOGRAM  05/28/2010; 10/2017   2012; Normal LVEF, LVOT obstruction, lateral wall hypokinesis.  10/2017: EF 55%, hypertrophic CM, grd II dd, normal global wall motion.   Social History   Socioeconomic History  . Marital status: Married    Spouse name: Not on file  . Number of children: 1  . Years of education: Not on file  . Highest education level: Not on file  Occupational History  . Occupation: retired    Fish farm manager: RETIRED  Social Needs  . Financial resource strain: Not on file  . Food insecurity    Worry: Not on file    Inability: Not on file  . Transportation needs    Medical: Not on file    Non-medical: Not on file  Tobacco Use  . Smoking status: Former Smoker    Packs/day: 0.50    Years: 40.00    Pack years: 20.00    Types: Cigarettes, Cigars    Quit date: 04/29/1998    Years since quitting: 20.8  . Smokeless tobacco: Never Used  . Tobacco comment: 4 cigars  Substance and Sexual Activity  . Alcohol use: No    Comment: occasionally  . Drug use: No  . Sexual activity: Not on file  Lifestyle  . Physical activity    Days per week: Not on file    Minutes per session: Not on file  . Stress: Not on file  Relationships  . Social Herbalist on phone: Not on file    Gets together: Not on file    Attends religious service: Not on file    Active member of club or organization: Not on file    Attends meetings of clubs or organizations: Not on file    Relationship status: Not on file  . Intimate partner violence    Fear of current or ex partner: Not on file    Emotionally abused: Not on file    Physically abused: Not on file    Forced sexual activity: Not on file  Other Topics Concern  . Not on file  Social History Narrative   Married, 1 son, 1 granddaughter.   Retired from [employer] 2000.   Hobbies: trading  farm equipment.  Used to be a Environmental manager.   Tobacco x many years, quit @ 2005   No ETOH or drugs.   No regular exercise.Smoking Status:  quit            ROS  Review of Systems  Constitution: Negative for chills, decreased appetite, malaise/fatigue and weight gain.  Cardiovascular: Positive for dyspnea on exertion (stable) and leg swelling (stable). Negative for syncope.  Respiratory: Positive for sleep disturbances due to breathing (  sleep apnea on CPAP) and snoring.   Endocrine: Negative for cold intolerance.  Hematologic/Lymphatic: Does not bruise/bleed easily.  Musculoskeletal: Negative for joint swelling.  Gastrointestinal: Negative for abdominal pain, anorexia and change in bowel habit.  Neurological: Negative for headaches and light-headedness.  Psychiatric/Behavioral: Negative for depression and substance abuse.  All other systems reviewed and are negative.  Objective   Vitals with BMI 03/11/2019 03/05/2019 09/04/2018  Height 5\' 9"  5\' 9"  5\' 9"   Weight 252 lbs 8 oz 251 lbs 13 oz 250 lbs 3 oz  BMI 37.27 [id number]  Systolic [id number]  Diastolic 70 72 73  Pulse 64 62 79      Physical Exam  Constitutional: He appears well-developed. No distress.  Well built and Morbidly obese in no acute distress.   HENT:  Head: Atraumatic.  Eyes: Conjunctivae are normal.  Neck: Neck supple. No thyromegaly present.  Short neck and difficult to evaluate JVP  Cardiovascular: Normal rate and regular rhythm. Exam reveals no gallop.  Murmur heard.  Harsh midsystolic murmur is present at the upper right sternal border radiating to the neck. Pulses:      Carotid pulses are 2+ on the right side and 2+ on the left side.      Dorsalis pedis pulses are 2+ on the right side and 2+ on the left side.       Posterior tibial pulses are 2+ on the right side and 2+ on the left side.  Femoral and popliteal pulse difficult to feel due to patient's body habitus.  trace+ pitting leg edema  bilateral. No JVD.   Pulmonary/Chest: Effort normal and breath sounds normal.  Abdominal: Soft. Bowel sounds are normal.  Obese. Pannus present  Musculoskeletal: Normal range of motion.  Neurological: He is alert.  Skin: Skin is warm and dry.  Psychiatric: He has a normal mood and affect.    Laboratory examination:    Recent Labs    07/08/18 1029 03/05/19 0831  NA 140 140  K 3.8 4.0  CL 102 102  CO2 30 26  GLUCOSE 138* 139*  BUN 18 22  CREATININE 1.01 1.07  CALCIUM 9.7 9.7   estimated creatinine clearance is 77.9 mL/min (by C-G formula based on SCr of 1.07 mg/dL).  CMP Latest Ref Rng & Units 03/05/2019 07/08/2018 03/06/2018  Glucose 70 - 99 mg/dL 139(H) 138(H) 117(H)  BUN 6 - 23 mg/dL 22 18 13   Creatinine 0.40 - 1.50 mg/dL 1.07 1.01 1.00  Sodium 135 - 145 mEq/L 140 140 142  Potassium 3.5 - 5.1 mEq/L 4.0 3.8 4.4  Chloride 96 - 112 mEq/L 102 102 109  CO2 19 - 32 mEq/L 26 30 27   Calcium 8.4 - 10.5 mg/dL 9.7 9.7 9.4  Total Protein 6.0 - 8.3 g/dL 6.8 - 6.6  Total Bilirubin 0.2 - 1.2 mg/dL 1.0 - 0.8  Alkaline Phos 39 - 117 U/L 60 - 51  AST 0 - 37 U/L 28 - 26  ALT 0 - 53 U/L 20 - 19   CBC Latest Ref Rng & Units 03/05/2019  WBC 4.0 - 10.5 K/uL 9.2  Hemoglobin 13.0 - 17.0 g/dL 12.9(L)  Hematocrit 39.0 - 52.0 % 41.9  Platelets 150.0 - 400.0 K/uL 191.0   Lipid Panel     Component Value Date/Time   CHOL 93 03/05/2019 0831   TRIG 146.0 03/05/2019 0831   HDL 32.50 (L) 03/05/2019 0831   CHOLHDL 3 03/05/2019 0831   VLDL 29.2 03/05/2019 0831   LDLCALC  31 03/05/2019 0831   HEMOGLOBIN A1C Lab Results  Component Value Date   HGBA1C 7.6 (H) 03/05/2019   TSH Recent Labs    03/05/19 0831  TSH 1.89   Medications and allergies  No Known Allergies   Current Outpatient Medications  Medication Instructions  . albuterol (PROAIR HFA) 108 (90 Base) MCG/ACT inhaler INHALE 2 PUFFS INTO THE LUNGS EVERY 4 HOURS AS NEEDED FOR WHEEZING  . allopurinol (ZYLOPRIM) 300 MG tablet TAKE 1  TABLET BY MOUTH TWO  TIMES DAILY  . aspirin 81 mg, Oral, Daily  . canagliflozin (INVOKANA) 100 mg, Oral, 2 times weekly, Take 200 mg po daily  . chlorthalidone (HYGROTON) 25 MG tablet TAKE 1 TABLET BY MOUTH EVERY MORNING  . docusate sodium (COLACE) 100 mg, Oral, Daily  . fluticasone (FLONASE) 50 MCG/ACT nasal spray 2 sprays, Each Nare, Daily PRN  . glipiZIDE (GLUCOTROL XL) 10 MG 24 hr tablet TAKE 1 TABLET BY MOUTH  DAILY  . glucose blood (ONE TOUCH ULTRA TEST) test strip Use as instructed to check blood sugar.  DX 250.00  . metFORMIN (GLUCOPHAGE) 1000 MG tablet TAKE 1 TABLET BY MOUTH  TWICE DAILY WITH MEALS  . metoprolol tartrate (LOPRESSOR) 50 mg, Oral, 2 times daily, As per Dr. Einar Gip  . mometasone-formoterol (DULERA) 200-5 MCG/ACT AERO 2 puffs, Inhalation, 2 times daily  . ONE TOUCH LANCETS MISC Use as directed to check blood sugar.  DX 250.00  . pantoprazole (PROTONIX) 40 MG tablet TAKE 1 TABLET BY MOUTH  DAILY  . rosuvastatin (CRESTOR) 20 MG tablet TAKE 1 TABLET BY MOUTH AT  BEDTIME  . verapamil (VERELAN PM) 120 MG 24 hr capsule 1 capsule, Oral, Daily  . Vitamin D 2,000 Units, Oral, Daily    Radiology:  No results found. Cardiac Studies:   TEE 06/06/10: Normal LVEF. Mod LVH, chordal SAM & LVOT obst with 16mm Hg PG. Not HOCM. No significant mitral regurgitation.    Carotid Doppler  [10/11/2013]: No evidence of hemodynamically significant stenosis in the bilateral carotid bifurcation vessels, Tortuosity may be the source of bruit. No significant change from 05/28/2010.  Echocardiogram   11/12/2017: Left ventricle cavity is normal in size. Severe concentric hypertrophy of the left ventricle. Posterior and septal walls both measure 2 cm in AP dimension. Normal global wall motion. Doppler evidence of grade II (pseudonormal) diastolic dysfunction, elevated LAP. Calculated EF 55%. Hypertrophic cardiomyopathy. Left atrial cavity is mildly dilated, measuring 4.6 cm in AP dimension. Inadequate  tricuspid regurgitation jet to estimate pulmonary artery pressure. Normal right atrial pressure. No significant change compared to prior study dated 12/17/2016.  Nuclear stress test   11/10/2017: 1. Lexiscan stress test was performed. Exercise capacity was not assessed. Stress symptoms included dyspnea. Peak blood pressure was 150/82 mmHg. The resting and stress electrocardiogram demonstrated normal sinus rhythm, normal resting conduction, frequent multifocal PAC;s, lateral T wave inversions. Stress EKG is non diagnostic for ischemia as it is a pharmacologic stress. 2. The overall quality of the study is good. Left ventricular cavity is noted to be normal on the rest and stress studies. Gated SPECT images reveal normal myocardial thickening and wall motion. The left ventricular ejection fraction was calculated to be 40%, although visually looks normal. SPECT images demonstrate small perfusion abnormality of mild intensity in the basal inferior and mid inferior myocardial wall(s) on the stress images with mild reversibility. 3. Intermediate risk study.  Coronary angiogram 01/06/2018:  Mid LAD 20% stenosis, otherwise no significant disease. Normal LVEF. 140 mmHg intraventricular PG  noted. LVEDP moderate to severely elevated.  Assessment     ICD-10-CM   1. Hypertrophic obstructive cardiomyopathy (HOCM) (HCC)  I42.1 EKG 12-Lead    EKG 12-Lead  2. Class 2 severe obesity due to excess calories with serious comorbidity and body mass index (BMI) of 39.0 to 39.9 in adult ([place])  E66.01    Z68.39   3. Pure hypercholesterolemia  E78.00   4. Essential hypertension  I10     EKG 03/03/2019: Normal sinus rhythm at rate of 55 bpm, LAE, left axis deviation, left anterior fascicular block.  LVH with repolarization abnormality, cannot exclude anterolateral ischemia.  No significant change from  EKG- 09/29/2017, frequent PAC not present.  Recommendations:  No orders of the defined types were placed in this  encounter.   Patient with LVOT obstruction due to hypertensive heart disease and also obesity, has subaortic stenosis, obstructive sleep apnea on CPAP, hyperlipidemia, diabetes mellitus and morbid obesity.  He presents for his six-month office visit.  Since last office visit he has lost significant amount of weight, his noticed marked improvement in dyspnea and complete resolution of leg edema.  There is no clinical evidence of heart failure, blood pressure is well controlled, overall she's stable from cardiac standpoint.  Positive reinforcement given to the patient.  He complains of constipation, advised him to use Metamucil on a daily basis either before meals to reduce weight or after meals for smooth bowel movements.  I reviewed his labs, lipids are well controlled, I will see him back in 1 year.   Adrian Prows, MD, [facility] 03/11/2019, 9:19 AM [facility]. [place] Pager: [phone] Office: [phone] If no answer Cell [phone]

## 2019-03-11 ENCOUNTER — Ambulatory Visit (INDEPENDENT_AMBULATORY_CARE_PROVIDER_SITE_OTHER): Payer: Medicare Other | Admitting: Cardiology

## 2019-03-11 ENCOUNTER — Encounter: Payer: Self-pay | Admitting: Cardiology

## 2019-03-11 ENCOUNTER — Other Ambulatory Visit: Payer: Self-pay

## 2019-03-11 ENCOUNTER — Other Ambulatory Visit: Payer: Self-pay | Admitting: Cardiology

## 2019-03-11 VITALS — BP 122/70 | HR 64 | Temp 98.0°F | Ht 69.0 in | Wt 252.5 lb

## 2019-03-11 DIAGNOSIS — I10 Essential (primary) hypertension: Secondary | ICD-10-CM

## 2019-03-11 DIAGNOSIS — Z6839 Body mass index (BMI) 39.0-39.9, adult: Secondary | ICD-10-CM

## 2019-03-11 DIAGNOSIS — E7800 Pure hypercholesterolemia, unspecified: Secondary | ICD-10-CM | POA: Diagnosis not present

## 2019-03-11 DIAGNOSIS — I421 Obstructive hypertrophic cardiomyopathy: Secondary | ICD-10-CM

## 2019-03-15 DIAGNOSIS — M1711 Unilateral primary osteoarthritis, right knee: Secondary | ICD-10-CM | POA: Diagnosis not present

## 2019-03-16 ENCOUNTER — Other Ambulatory Visit: Payer: Medicare Other

## 2019-03-16 DIAGNOSIS — D509 Iron deficiency anemia, unspecified: Secondary | ICD-10-CM

## 2019-03-16 LAB — HEMOCCULT SLIDES (X 3 CARDS)
Fecal Occult Blood: NEGATIVE
OCCULT 1: NEGATIVE
OCCULT 2: NEGATIVE
OCCULT 3: NEGATIVE
OCCULT 4: NEGATIVE
OCCULT 5: NEGATIVE

## 2019-04-01 ENCOUNTER — Telehealth: Payer: Self-pay

## 2019-04-01 ENCOUNTER — Other Ambulatory Visit: Payer: Self-pay

## 2019-04-01 NOTE — Patient Outreach (Signed)
[facility]) Care Management  04/01/2019  BILLYE HOPPA 12/18/1945 [id number]   Medication Adherence call to Mr. [place] Compliant Voice message left with a call back number. Mr. Colgrove is showing past due on Rosuvastatin 20 mg under [place].   [facility] Management Direct Dial [phone]  Fax [phone] [email or web address]

## 2019-04-01 NOTE — Telephone Encounter (Signed)
Received form for patient assistance regarding one of pt's meds, Invokana. PCP will review and sign, if appropriate.

## 2019-04-14 DIAGNOSIS — M1711 Unilateral primary osteoarthritis, right knee: Secondary | ICD-10-CM | POA: Diagnosis not present

## 2019-04-26 ENCOUNTER — Encounter: Payer: Self-pay | Admitting: Family Medicine

## 2019-05-10 ENCOUNTER — Telehealth: Payer: Self-pay | Admitting: Family Medicine

## 2019-05-10 NOTE — Telephone Encounter (Signed)
Patient wife dropped off form to completed by Dr. Anitra Lauth regarding patient assistance program for his Invokana prescription.  Please call when ready for pick up.

## 2019-05-10 NOTE — Telephone Encounter (Signed)
PCP will review and sign, if appropriate.  

## 2019-05-11 NOTE — Telephone Encounter (Signed)
Paperwork reviewed, signed, and handed to [place].

## 2019-05-23 ENCOUNTER — Other Ambulatory Visit: Payer: Self-pay | Admitting: Family Medicine

## 2019-05-23 ENCOUNTER — Other Ambulatory Visit: Payer: Self-pay | Admitting: Cardiology

## 2019-05-27 ENCOUNTER — Encounter: Payer: Self-pay | Admitting: Family Medicine

## 2019-06-03 LAB — HEMOGLOBIN A1C: Hemoglobin A1C: 6.4

## 2019-06-09 ENCOUNTER — Telehealth: Payer: Self-pay

## 2019-06-09 NOTE — Telephone Encounter (Signed)
Wife, Mechele Claude, calling about patient meds, verified PHI. [employer] no longer covering his medication  canagliflozin (INVOKANA) 100 MG TABS tablet   They are suggesting patient to take  [place]   Patient has form for Dr. Anitra Lauth to complete once he recommends which med from the above patient needs to start taking  Please call Mechele Claude at [phone]

## 2019-06-09 NOTE — Telephone Encounter (Signed)
Patient's most recent refill for this medication was 02/04/19 (60,2). His last f/u was 03/05/19 and will be due again May.  Please advise which med patient can take between two alternatives, [place] or Jardiance.

## 2019-06-15 NOTE — Telephone Encounter (Signed)
Patient's most recent refill for this medication was 02/04/19 (60,2). His last f/u was 03/05/19 and will be due again May.  Please advise which med patient can take between two alternatives, [place] or Jardiance.Please advise, thanks.

## 2019-06-15 NOTE — Telephone Encounter (Signed)
2nd call ---- has not heard anything yet, wife states she called over week ago

## 2019-06-18 MED ORDER — JARDIANCE 25 MG PO TABS: 25.0000 mg | ORAL_TABLET | Freq: Every day | ORAL | 11 refills | Status: DC

## 2019-06-18 NOTE — Addendum Note (Signed)
Addended by: Tammi Sou on: 06/18/2019 12:58 PM   Modules accepted: Orders

## 2019-06-18 NOTE — Telephone Encounter (Signed)
OK. Jardiance eRx'd to [facility] in [place].

## 2019-06-18 NOTE — Telephone Encounter (Signed)
Pt's wife advised. Okay per [facility]

## 2019-06-22 ENCOUNTER — Encounter: Payer: Self-pay | Admitting: Family Medicine

## 2019-06-29 ENCOUNTER — Telehealth: Payer: Self-pay

## 2019-06-29 NOTE — Telephone Encounter (Signed)
Received patient assistance form regarding Jardiance coverage. Placed on PCP desk to review and sign, if appropriate.

## 2019-07-02 NOTE — Telephone Encounter (Signed)
Signed and put in box to go up front. Signed:  Crissie Sickles, MD           07/02/2019

## 2019-07-06 ENCOUNTER — Telehealth: Payer: Self-pay

## 2019-07-06 NOTE — Telephone Encounter (Signed)
I did rx jardiance 25mg , not 10mg . I corrected the form and signed it and put it on your desk.

## 2019-07-06 NOTE — Telephone Encounter (Signed)
Copy made for pt's chart, pt's wife Arville Go notified that form has been placed up front for pick up.

## 2019-07-06 NOTE — Telephone Encounter (Signed)
Erroneous encounter

## 2019-07-06 NOTE — Telephone Encounter (Signed)
Placed on PCP desk to review and sign, if appropriate. Dosage on pt's form says 10mg  but Rx written on 06/18/19 is for 25mg  (30,11).

## 2019-07-06 NOTE — Telephone Encounter (Signed)
Patient's wife dropped off Jardiance patient assistance form to be completed and picked up by patient.

## 2019-09-21 DIAGNOSIS — Z961 Presence of intraocular lens: Secondary | ICD-10-CM | POA: Diagnosis not present

## 2019-09-21 DIAGNOSIS — H02824 Cysts of left upper eyelid: Secondary | ICD-10-CM | POA: Diagnosis not present

## 2019-09-21 DIAGNOSIS — H524 Presbyopia: Secondary | ICD-10-CM | POA: Diagnosis not present

## 2019-09-21 DIAGNOSIS — E119 Type 2 diabetes mellitus without complications: Secondary | ICD-10-CM | POA: Diagnosis not present

## 2019-09-21 LAB — HM DIABETES EYE EXAM

## 2019-09-22 ENCOUNTER — Encounter: Payer: Self-pay | Admitting: Family Medicine

## 2019-09-30 DIAGNOSIS — H02824 Cysts of left upper eyelid: Secondary | ICD-10-CM | POA: Diagnosis not present

## 2019-10-01 ENCOUNTER — Other Ambulatory Visit: Payer: Self-pay | Admitting: Cardiology

## 2019-10-20 DIAGNOSIS — M1711 Unilateral primary osteoarthritis, right knee: Secondary | ICD-10-CM | POA: Diagnosis not present

## 2019-11-05 ENCOUNTER — Other Ambulatory Visit: Payer: Self-pay

## 2019-11-05 ENCOUNTER — Ambulatory Visit (INDEPENDENT_AMBULATORY_CARE_PROVIDER_SITE_OTHER): Payer: Medicare Other | Admitting: Internal Medicine

## 2019-11-05 ENCOUNTER — Encounter: Payer: Self-pay | Admitting: Internal Medicine

## 2019-11-05 DIAGNOSIS — E119 Type 2 diabetes mellitus without complications: Secondary | ICD-10-CM

## 2019-11-05 DIAGNOSIS — M79672 Pain in left foot: Secondary | ICD-10-CM | POA: Diagnosis not present

## 2019-11-05 DIAGNOSIS — M79605 Pain in left leg: Secondary | ICD-10-CM

## 2019-11-05 MED ORDER — DICLOFENAC SODIUM 1 % EX GEL: 2.0000 g | Freq: Four times a day (QID) | CUTANEOUS | 3 refills | Status: AC

## 2019-11-05 NOTE — Assessment & Plan Note (Addendum)
Advised he could have a fracture in the metatarsal and offered x-ray however advised that this would likely not impact treatment since he is walking well on the foot without additional pain. No signs of infection or open wounds. Rx voltaren gel for pain.

## 2019-11-05 NOTE — Assessment & Plan Note (Signed)
Due to hematoma on the left leg. Reassurance given that this does not look infected. Advised these can take weeks to months to resolve.

## 2019-11-05 NOTE — Patient Instructions (Signed)
We have sent in the gel for the leg.

## 2019-11-05 NOTE — Assessment & Plan Note (Signed)
Recently well controlled and without neuropathy per patient. Advised this could impact his healing time and that he should monitor feet closely.

## 2019-11-05 NOTE — Progress Notes (Signed)
   Subjective:   Patient ID: Jack [name], male    DOB: 11-11-1945, 74 y.o.   MRN: [id number]  HPI The patient is a 74 YO man coming in for new concern of left leg redness (hit leg with tablet falling on it he was moving about 2 weeks ago, got bruising in that area originally and this lump, the bruising faded gradually, this lump is still present although slightly smaller, he is icing, wanted to make sure since he has diabetes that this is not infected) and left foot pain (hurting down around the toes, does not hurt while he is sleeping, hurts some throughout the day, not worse with walking, overall 2-3/10, not taking medication for the pain at this time, improving gradually). He did drop a tablet on the leg about 2 weeks ago. Originally there was more bruising on the foot and leg. The bruising has cleared some but there is still a lump on the left leg which is red and slightly swollen. He does have concurrent diabetes which is well controlled and does not have problems with neuropathy to his knowledge.   Review of Systems  Constitutional: Negative.   HENT: Negative.   Eyes: Negative.   Respiratory: Negative for cough, chest tightness and shortness of breath.   Cardiovascular: Negative for chest pain, palpitations and leg swelling.  Gastrointestinal: Negative for abdominal distention, abdominal pain, constipation, diarrhea, nausea and vomiting.  Musculoskeletal: Positive for arthralgias and myalgias.  Skin: Positive for color change.  Neurological: Negative.   Psychiatric/Behavioral: Negative.     Objective:  Physical Exam Constitutional:      Appearance: He is well-developed.  HENT:     Head: Normocephalic and atraumatic.  Cardiovascular:     Rate and Rhythm: Normal rate and regular rhythm.  Pulmonary:     Effort: Pulmonary effort is normal. No respiratory distress.     Breath sounds: Normal breath sounds. No wheezing or rales.  Abdominal:     General: Bowel sounds are normal.  There is no distension.     Palpations: Abdomen is soft.     Tenderness: There is no abdominal tenderness. There is no rebound.  Musculoskeletal:        General: Tenderness present.     Cervical back: Normal range of motion.     Comments: 3 cm by 1 cm ovoid hematoma on the lateral left shin lower leg without signs of cellulitis. Bruising on the left foot 3-4th toes with tenderness over the metatarsals   Skin:    General: Skin is warm and dry.  Neurological:     Mental Status: He is alert and oriented to person, place, and time.     Coordination: Coordination normal.     Vitals:   11/05/19 1406  BP: 126/82  Pulse: 74  Temp: 97.8 F (36.6 C)  TempSrc: Oral  SpO2: 96%  Weight: 255 lb (115.7 kg)  Height: 5\' 9"  (1.753 m)    This visit occurred during the SARS-CoV-2 public health emergency.  Safety protocols were in place, including screening questions prior to the visit, additional usage of staff PPE, and extensive cleaning of exam room while observing appropriate contact time as indicated for disinfecting solutions.   Assessment & Plan:

## 2019-11-22 DIAGNOSIS — M1711 Unilateral primary osteoarthritis, right knee: Secondary | ICD-10-CM | POA: Diagnosis not present

## 2019-11-29 DIAGNOSIS — M1711 Unilateral primary osteoarthritis, right knee: Secondary | ICD-10-CM | POA: Diagnosis not present

## 2019-12-13 DIAGNOSIS — M1711 Unilateral primary osteoarthritis, right knee: Secondary | ICD-10-CM | POA: Diagnosis not present

## 2020-01-05 ENCOUNTER — Telehealth: Payer: Self-pay

## 2020-01-05 NOTE — Telephone Encounter (Signed)
Received patient assistance form for Jardiance coverage, Placed on PCP desk to review and sign, if appropriate.

## 2020-01-07 NOTE — Telephone Encounter (Signed)
Signed and put in box to go up front. Signed:  Crissie Sickles, MD           01/07/2020

## 2020-01-30 ENCOUNTER — Other Ambulatory Visit: Payer: Self-pay | Admitting: Family Medicine

## 2020-01-30 DIAGNOSIS — J209 Acute bronchitis, unspecified: Secondary | ICD-10-CM

## 2020-02-14 NOTE — Progress Notes (Signed)
OFFICE VISIT  02/15/2020  CC:  Chief Complaint  Patient presents with  . Follow-up    RCI, pt is fasting   HPI:    Patient is a 74 y.o. Caucasian male who presents for f/u DM 2, HTN, HLD, iron def anemia. Last visit 02/2019 his iron was low and he had mild microcytic anemia.  Hemoccults were negative. I referred him to GI but he did not return their calls to make appt.  INTERIM HX: Feeling well.  DM: no home monitoring.  Compliant with jardiance, metformin, and glipizide at max dosing of each.  HTN: no home monitoring.  Compliant with hctz 25 qd, metoprolol 50mg  bid, cardizem cd 120 qd.  HLD: tolerating rosuva 20 qd.  Still active with yardwork. Walks a lot from home to garage over and over daily. Diet: fair.  "church social yesterday and I ate like a pig".   ROS: no fevers, no CP, no SOB, no wheezing, no cough, no dizziness, no HAs, no rashes, no melena/hematochezia.  No polyuria or polydipsia.  No myalgias or arthralgias.  No focal weakness, paresthesias, or tremors.  No acute vision or hearing abnormalities. No n/v/d or abd pain.  No palpitations.      Past Medical History:  Diagnosis Date  . Allergy   . CAD (coronary artery disease)    see PSH section for details  . Carotid bruit 05/28/10   Tortuosity of carotids on doppler u/s, no stenosis.  . Cataract   . Chronic diastolic heart failure (HCC)    Grd II DD  . Colon cancer screening    cologuard POSITIVE 09/2017-->colonoscopy 12/19/17; multiple adenomatous polyps, recall 3 yrs.  Marland Kitchen COPD (chronic obstructive pulmonary disease) ([place])   . Diabetes mellitus    type 2- non insulin-requiring  . DOE (dyspnea on exertion)    Cardiology w/u unrevealing except LVOT obstruction: suspect dyspnea due to HOCM, morbid obesity and OSA--stable as of 02/2017 cardiol f/u.  Marland Kitchen Fatty liver u/s and CT 2009   w/mildly elevated transaminases; u/s 12/2012 reconfirmed dx.  Hep B and C testing negative.  Marland Kitchen GERD (gastroesophageal reflux  disease)    EDG 04-03-01, + distal esophageal stricture dilation  . Gout   . Heart murmur   . Hiatal hernia   . Hx of adenomatous colonic polyps 12/27/2017  . Hyperlipidemia   . Hypertension    severe LVH on echo  . Hypertrophic obstructive cardiomyopathy (HOCM) ([place]) 2012   LVOT obstruction stable on echo 01/09/12 and again on f/u echo 09/29/14, 11/2016, 10/2017--with evidence of HOCM due to chordal systolic anterior motion of MV leaflet. Grd II DD.    . Iron deficiency anemia 02/2019   home hemoccults ordered but pt never returned these.  GI referral was done but pt did not return their call/unable to be contacted.  . Moderate persistent asthma 04/19/2014  . Morbid obesity ([place])   . OSA on CPAP    Could not tolerate CPAP, not surgical candidate per Dr. Benjamine Mola.  Restarted CPAP (auto titrate 5-15 with full facemask--Dr. Alva 2017.  Getting mask fitting/leak issues worked out as of 07/2016 pulm f/u.  . Osteoarthritis, multiple sites    primarily knees and back.  . Stricture and stenosis of esophagus   . Venous insufficiency of both lower extremities     Past Surgical History:  Procedure Laterality Date  . CARDIOVASCULAR STRESS TEST  2006; 04/27/10; 10/2017   Normal stress nuclear study, normal EF.  2019: EF 40%, inferior ischemia--intermediate risk study-->Dr.  Ganji to do cath.  . COLONOSCOPY  x 3   Most recent-->12/19/17 (after + cologuard) multiple adenomatous polyps-->recall 3 yrs.  Marland Kitchen LEFT HEART CATH AND CORONARY ANGIOGRAPHY N/A 01/06/2018   One area of 80% ostial stenosis--small and hard to visualize. No intervention.  DAPT for >1 yr recommended.  Procedure: LEFT HEART CATH AND CORONARY ANGIOGRAPHY;  Surgeon: Adrian Prows, MD;  Location: [facility];  Service: Cardiovascular;  Laterality: N/A;  . NASAL SINUS SURGERY    . Sleep study  07/2016   OSA; CPAP trial at 10 cm H20 recommended by pulm (Dr. Tennis Must Dios--[place] pulm).  . [place]   ? Discectomy  . TEE WITHOUT CARDIOVERSION   05/2010   Dr. Einar Gip; severe LVH, no valvular abnormalities  . TRANSESOPHAGEAL ECHOCARDIOGRAM  06/06/10; 12/2011; 09/2014; 11/2016   LVOT obst with 135 mm Hg PG. HOCM. No AV obstruction to flow. No mitral regurg..  Repeat echo 01/09/12 no change.  2016: EF 60%, severe conc LV hypertrophy, normal global wall motion, LA mildly dilated, systolic anterior motion of mitral valve chord, mild MR, mild TR, mild pulm HTN--no signif change since 2013.  Repeat echo 11/2016 and 10/2017 no signif change compared to prior studies.  . TRANSTHORACIC ECHOCARDIOGRAM  05/28/2010; 10/2017   2012; Normal LVEF, LVOT obstruction, lateral wall hypokinesis.  10/2017: EF 55%, hypertrophic CM, grd II dd, normal global wall motion.    Outpatient Medications Prior to Visit  Medication Sig Dispense Refill  . albuterol (PROAIR HFA) 108 (90 Base) MCG/ACT inhaler INHALE 2 PUFFS INTO THE LUNGS EVERY 4 HOURS AS NEEDED FOR WHEEZING 8.5 g 0  . allopurinol (ZYLOPRIM) 300 MG tablet TAKE 1 TABLET BY MOUTH  TWICE DAILY 30 tablet 0  . aspirin 81 MG tablet Take 81 mg by mouth daily.     . chlorthalidone (HYGROTON) 25 MG tablet TAKE 1 TABLET BY MOUTH EVERY MORNING 90 tablet 2  . Cholecalciferol (VITAMIN D) 2000 UNITS tablet Take 2,000 Units by mouth daily.    . diclofenac Sodium (VOLTAREN) 1 % GEL Apply 2 g topically 4 (four) times daily. 100 g 3  . docusate sodium (COLACE) 100 MG capsule Take 100 mg by mouth daily.    . empagliflozin (JARDIANCE) 25 MG TABS tablet Take 25 mg by mouth daily before breakfast. 30 tablet 11  . fluticasone (FLONASE) 50 MCG/ACT nasal spray USE 2 SPRAYS IN BOTH  NOSTRILS DAILY AS NEEDED  FOR ALLERGIES. 48 g 0  . glipiZIDE (GLUCOTROL XL) 10 MG 24 hr tablet TAKE 1 TABLET BY MOUTH  DAILY 90 tablet 3  . glucose blood (ONE TOUCH ULTRA TEST) test strip Use as instructed to check blood sugar.  DX 250.00 100 each prn  . metFORMIN (GLUCOPHAGE) 1000 MG tablet TAKE 1 TABLET BY MOUTH  TWICE DAILY WITH MEALS 180 tablet 3  . metoprolol  tartrate (LOPRESSOR) 50 MG tablet TAKE 1 TABLET BY MOUTH TWO  TIMES DAILY 180 tablet 3  . mometasone-formoterol (DULERA) 200-5 MCG/ACT AERO Inhale 2 puffs into the lungs 2 (two) times daily. (Patient taking differently: Inhale 2 puffs into the lungs daily as needed for wheezing or shortness of breath. ) 1 Inhaler 0  . ONE TOUCH LANCETS MISC Use as directed to check blood sugar.  DX 250.00 200 each prn  . pantoprazole (PROTONIX) 40 MG tablet TAKE 1 TABLET BY MOUTH  DAILY 30 tablet 0  . rosuvastatin (CRESTOR) 20 MG tablet TAKE 1 TABLET BY MOUTH AT  BEDTIME 90  tablet 3  . verapamil (VERELAN PM) 120 MG 24 hr capsule TAKE 1 CAPSULE BY MOUTH  DAILY 90 capsule 3   No facility-administered medications prior to visit.    No Known Allergies  ROS As per HPI  PE: Vitals with BMI 02/15/2020 11/05/2019 03/11/2019  Height 5\' 9"  5\' 9"  5\' 9"   Weight 259 lbs 13 oz 255 lbs 252 lbs 8 oz  BMI 38.35 86.75 44.92  Systolic 010 071 219  Diastolic 71 82 70  Pulse 66 74 64     Gen: Alert, well appearing.  Patient is oriented to person, place, time, and situation. AFFECT: pleasant, lucid thought and speech. CV: RRR, 4/6 systolic murmur, S1 partially obscured, S2 distinct.  No diastolic murmur, no r/g.   LUNGS: CTA bilat, nonlabored resps, good aeration in all lung fields. EXT: no clubbing or cyanosis.  2+ bilat LL pitting edema.    LABS:  Lab Results  Component Value Date   TSH 1.89 03/05/2019   Lab Results  Component Value Date   WBC 9.2 03/05/2019   HGB 12.9 (L) 03/05/2019   HCT 41.9 03/05/2019   MCV 71.2 (L) 03/05/2019   PLT 191.0 03/05/2019   Lab Results  Component Value Date   IRON 25 (L) 03/09/2019   TIBC 452 (H) 03/09/2019   FERRITIN 11 (L) 03/09/2019    Lab Results  Component Value Date   CREATININE 1.07 03/05/2019   BUN 22 03/05/2019   NA 140 03/05/2019   K 4.0 03/05/2019   CL 102 03/05/2019   CO2 26 03/05/2019   Lab Results  Component Value Date   ALT 20 03/05/2019   AST  28 03/05/2019   ALKPHOS 60 03/05/2019   BILITOT 1.0 03/05/2019   Lab Results  Component Value Date   CHOL 93 03/05/2019   Lab Results  Component Value Date   HDL 32.50 (L) 03/05/2019   Lab Results  Component Value Date   LDLCALC 31 03/05/2019   Lab Results  Component Value Date   TRIG 146.0 03/05/2019   Lab Results  Component Value Date   CHOLHDL 3 03/05/2019   Lab Results  Component Value Date   PSA 0.81 02/28/2017   PSA 0.70 11/28/2015   PSA 0.71 04/19/2014   Lab Results  Component Value Date   HGBA1C 6.4 06/03/2019   IMPRESSION AND PLAN:  1) DM 2, hx of good control. No home gluc monitoring. Continue metformin 1000mg  bid, glipizide xl 10 mg qd, jardiance 25mg  qd. Hba1c, urine microalb/cr today.  2) HTN: excellent bp here today. No home monitoring. Continue lopressor 25mg  bid, cardizem cd 120 qd, and hctz 25mg  qd. BMET today.  3) HLD: tolerating atorva 20mg  qd. FLP and hepatic panel today.  4) IDA 02/2019: hemoccults neg. He never returned calls to GI to arrange appt. No abd pain, melena, or BRBPR. Recheck iron labs, cbc.  An After Visit Summary was printed and given to the patient.  FOLLOW UP: No follow-ups on file.  Signed:  Crissie Sickles, MD           02/15/2020

## 2020-02-15 ENCOUNTER — Other Ambulatory Visit: Payer: Self-pay

## 2020-02-15 ENCOUNTER — Ambulatory Visit (INDEPENDENT_AMBULATORY_CARE_PROVIDER_SITE_OTHER): Payer: Medicare Other | Admitting: Family Medicine

## 2020-02-15 ENCOUNTER — Encounter: Payer: Self-pay | Admitting: Family Medicine

## 2020-02-15 VITALS — BP 119/71 | HR 66 | Temp 97.7°F | Resp 16 | Ht 69.0 in | Wt 259.8 lb

## 2020-02-15 DIAGNOSIS — E7800 Pure hypercholesterolemia, unspecified: Secondary | ICD-10-CM

## 2020-02-15 DIAGNOSIS — D509 Iron deficiency anemia, unspecified: Secondary | ICD-10-CM | POA: Diagnosis not present

## 2020-02-15 DIAGNOSIS — E611 Iron deficiency: Secondary | ICD-10-CM

## 2020-02-15 DIAGNOSIS — E119 Type 2 diabetes mellitus without complications: Secondary | ICD-10-CM | POA: Diagnosis not present

## 2020-02-15 DIAGNOSIS — I10 Essential (primary) hypertension: Secondary | ICD-10-CM | POA: Diagnosis not present

## 2020-02-15 LAB — CBC
HCT: 46 % (ref 39.0–52.0)
Hemoglobin: 14.7 g/dL (ref 13.0–17.0)
MCHC: 32 g/dL (ref 30.0–36.0)
MCV: 81.5 fl (ref 78.0–100.0)
Platelets: 154 10*3/uL (ref 150.0–400.0)
RBC: 5.65 Mil/uL (ref 4.22–5.81)
RDW: 19.1 % — ABNORMAL HIGH (ref 11.5–15.5)
WBC: 10.3 10*3/uL (ref 4.0–10.5)

## 2020-02-15 LAB — LIPID PANEL
Cholesterol: 103 mg/dL (ref 0–200)
HDL: 36.9 mg/dL — ABNORMAL LOW (ref >39.00)
LDL Cholesterol: 44 mg/dL (ref 0–99)
NonHDL: 65.69
Total CHOL/HDL Ratio: 3
Triglycerides: 109 mg/dL (ref 0.0–149.0)
VLDL: 21.8 mg/dL (ref 0.0–40.0)

## 2020-02-15 LAB — COMPREHENSIVE METABOLIC PANEL
ALT: 18 U/L (ref 0–53)
AST: 23 U/L (ref 0–37)
Albumin: 4.4 g/dL (ref 3.5–5.2)
Alkaline Phosphatase: 62 U/L (ref 39–117)
BUN: 20 mg/dL (ref 6–23)
CO2: 27 mEq/L (ref 19–32)
Calcium: 9.6 mg/dL (ref 8.4–10.5)
Chloride: 106 mEq/L (ref 96–112)
Creatinine, Ser: 1.03 mg/dL (ref 0.40–1.50)
GFR: 71.72 mL/min (ref >60.00)
Glucose, Bld: 128 mg/dL — ABNORMAL HIGH (ref 70–99)
Potassium: 4 mEq/L (ref 3.5–5.1)
Sodium: 142 mEq/L (ref 135–145)
Total Bilirubin: 0.9 mg/dL (ref 0.2–1.2)
Total Protein: 6.5 g/dL (ref 6.0–8.3)

## 2020-02-15 LAB — MICROALBUMIN / CREATININE URINE RATIO
Creatinine,U: 69.8 mg/dL
Microalb Creat Ratio: 1.3 mg/g (ref 0.0–30.0)
Microalb, Ur: 0.9 mg/dL (ref 0.0–1.9)

## 2020-02-15 LAB — HEMOGLOBIN A1C: Hgb A1c MFr Bld: 7.4 % — ABNORMAL HIGH (ref 4.6–6.5)

## 2020-02-16 ENCOUNTER — Other Ambulatory Visit: Payer: Self-pay

## 2020-02-16 DIAGNOSIS — E611 Iron deficiency: Secondary | ICD-10-CM

## 2020-02-16 LAB — IRON,TIBC AND FERRITIN PANEL
%SAT: 10 % (calc) — ABNORMAL LOW (ref 20–48)
Ferritin: 14 ng/mL — ABNORMAL LOW (ref 24–380)
Iron: 42 ug/dL — ABNORMAL LOW (ref 50–180)
TIBC: 437 mcg/dL (calc) — ABNORMAL HIGH (ref 250–425)

## 2020-02-24 ENCOUNTER — Telehealth: Payer: Self-pay | Admitting: Family Medicine

## 2020-02-24 ENCOUNTER — Other Ambulatory Visit: Payer: Self-pay | Admitting: Family Medicine

## 2020-02-24 DIAGNOSIS — E611 Iron deficiency: Secondary | ICD-10-CM

## 2020-02-24 DIAGNOSIS — E119 Type 2 diabetes mellitus without complications: Secondary | ICD-10-CM

## 2020-02-24 LAB — HEMOCCULT SLIDES (X 3 CARDS)
OCCULT 1: NEGATIVE
OCCULT 2: NEGATIVE
OCCULT 3: NEGATIVE

## 2020-02-24 NOTE — Telephone Encounter (Signed)
Patient's call returned regarding results. Given recommendations and lab visit scheduled for 05/26/20

## 2020-02-24 NOTE — Addendum Note (Signed)
Addended by: Octaviano Glow on: 02/24/2020 10:23 AM   Modules accepted: Orders

## 2020-02-24 NOTE — Telephone Encounter (Signed)
Returning call for lab results °

## 2020-03-10 ENCOUNTER — Ambulatory Visit: Payer: Medicare Other | Admitting: Cardiology

## 2020-03-10 ENCOUNTER — Encounter: Payer: Self-pay | Admitting: Cardiology

## 2020-03-10 ENCOUNTER — Other Ambulatory Visit: Payer: Self-pay

## 2020-03-10 VITALS — BP 135/71 | HR 60 | Ht 69.0 in | Wt 258.0 lb

## 2020-03-10 DIAGNOSIS — E7800 Pure hypercholesterolemia, unspecified: Secondary | ICD-10-CM

## 2020-03-10 DIAGNOSIS — F5101 Primary insomnia: Secondary | ICD-10-CM

## 2020-03-10 DIAGNOSIS — I10 Essential (primary) hypertension: Secondary | ICD-10-CM

## 2020-03-10 DIAGNOSIS — I421 Obstructive hypertrophic cardiomyopathy: Secondary | ICD-10-CM

## 2020-03-10 MED ORDER — VERAPAMIL HCL ER 120 MG PO TBCR: 180.0000 mg | EXTENDED_RELEASE_TABLET | ORAL | 3 refills | Status: DC

## 2020-03-10 MED ORDER — VERAPAMIL HCL ER 120 MG PO TBCR: 120.0000 mg | EXTENDED_RELEASE_TABLET | ORAL | 3 refills | Status: DC

## 2020-03-10 MED ORDER — TRAZODONE HCL 50 MG PO TABS: 50.0000 mg | ORAL_TABLET | Freq: Every day | ORAL | 1 refills | Status: DC

## 2020-03-10 NOTE — Progress Notes (Signed)
Primary Physician/Referring:  Tammi Sou, MD  Patient ID: Jack [name], male    DOB: [birth year], 74 y.o.   MRN: [id number]  Chief Complaint  Patient presents with  . Cardiomyopathy    1 year   . Follow-up   HPI:    Jack [name]  is a 74 y.o. 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    He is here in annual visit, states that he is doing well, dyspnea has remained stable, leg edema is also remained stable and very minimal.  His main complaint today is insomnia for the past 4 to 5 years.  Past Medical History:  Diagnosis Date  . Allergy   . CAD (coronary artery disease)    see PSH section for details  . Carotid bruit 05/28/10   Tortuosity of carotids on doppler u/s, no stenosis.  . Cataract   . Chronic diastolic heart failure (HCC)    Grd II DD  . Colon cancer screening    cologuard POSITIVE 09/2017-->colonoscopy 12/19/17; multiple adenomatous polyps, recall 3 yrs.  Marland Kitchen COPD (chronic obstructive pulmonary disease) ([place])   . Diabetes mellitus    type 2- non insulin-requiring  . DOE (dyspnea on exertion)    Cardiology w/u unrevealing except LVOT obstruction: suspect dyspnea due to HOCM, morbid obesity and OSA--stable as of 02/2017 cardiol f/u.  Marland Kitchen Fatty liver u/s and CT 2009   w/mildly elevated transaminases; u/s 12/2012 reconfirmed dx.  Hep B and C testing negative.  Marland Kitchen GERD (gastroesophageal reflux disease)    EDG 04-03-01, + distal esophageal stricture dilation  . Gout   . Heart murmur   . Hiatal hernia   . Hx of adenomatous colonic polyps 12/27/2017  . Hyperlipidemia   . Hypertension    severe LVH on echo  . Hypertrophic obstructive cardiomyopathy (HOCM) ([place]) 2012   LVOT obstruction stable on echo 01/09/12 and again on f/u echo  09/29/14, 11/2016, 10/2017--with evidence of HOCM due to chordal systolic anterior motion of MV leaflet. Grd II DD.    . Iron deficiency anemia 02/2019   home hemoccults ordered but pt never returned these.  GI referral was done but pt did not return their call/unable to be contacted.  . Moderate persistent asthma 04/19/2014  . Morbid obesity ([place])   . OSA on CPAP    Could not tolerate CPAP, not surgical candidate per Dr. Benjamine Mola.  Restarted CPAP (auto titrate 5-15 with full facemask--Dr. Alva 2017.  Getting mask fitting/leak issues worked out as of 07/2016 pulm f/u.  . Osteoarthritis, multiple sites    primarily knees and back.  . Stricture and stenosis of esophagus   . Venous insufficiency of both lower extremities    Past Surgical History:  Procedure Laterality Date  . CARDIOVASCULAR STRESS TEST  2006; 04/27/10; 10/2017   Normal stress nuclear study, normal EF.  2019: EF 40%, inferior ischemia--intermediate risk study-->Dr. Einar Gip to do cath.  . COLONOSCOPY  x 3   Most recent-->12/19/17 (after + cologuard) multiple adenomatous polyps-->recall 3 yrs.  Marland Kitchen LEFT HEART CATH AND CORONARY ANGIOGRAPHY N/A 01/06/2018   One area of 80% ostial stenosis--small and hard to visualize. No intervention.  DAPT for >1 yr recommended.  Procedure: LEFT HEART CATH AND CORONARY ANGIOGRAPHY;  Surgeon: Adrian Prows, MD;  Location: [facility];  Service: Cardiovascular;  Laterality: N/A;  . NASAL SINUS SURGERY    . Sleep study  07/2016   OSA; CPAP trial at 10 cm H20 recommended by pulm (Dr. Tennis Must Dios--[facility]).  . [place]   ? Discectomy  . TEE WITHOUT CARDIOVERSION  05/2010   Dr. Einar Gip; severe LVH, no valvular abnormalities  . TRANSESOPHAGEAL ECHOCARDIOGRAM  06/06/10; 12/2011; 09/2014; 11/2016   LVOT obst with 135 mm Hg PG. HOCM. No AV obstruction to flow. No mitral regurg..  Repeat echo 01/09/12 no change.  2016: EF 60%, severe conc LV hypertrophy, normal global wall motion, LA mildly dilated, systolic  anterior motion of mitral valve chord, mild MR, mild TR, mild pulm HTN--no signif change since 2013.  Repeat echo 11/2016 and 10/2017 no signif change compared to prior studies.  . TRANSTHORACIC ECHOCARDIOGRAM  05/28/2010; 10/2017   2012; Normal LVEF, LVOT obstruction, lateral wall hypokinesis.  10/2017: EF 55%, hypertrophic CM, grd II dd, normal global wall motion.   Social History   Tobacco Use  . Smoking status: Former Smoker    Packs/day: 0.50    Years: 40.00    Pack years: 20.00    Types: Cigarettes, Cigars    Quit date: 04/29/1998    Years since quitting: 21.8  . Smokeless tobacco: Never Used  . Tobacco comment: 4 cigars  Substance Use Topics  . Alcohol use: No    Comment: occasionally    ROS  Review of Systems  Cardiovascular: Positive for dyspnea on exertion and leg swelling. Negative for chest pain.  Gastrointestinal: Negative for melena.   Objective   Vitals with BMI 03/10/2020 02/15/2020 11/05/2019  Height 5\' 9"  5\' 9"  5\' 9"   Weight 258 lbs 259 lbs 13 oz 255 lbs  BMI 38.08 54.27 06.23  Systolic 762 831 517  Diastolic 71 71 82  Pulse 60 66 74    Physical Exam Constitutional:      General: He is not in acute distress.    Appearance: He is well-developed.     Comments: Well built and Morbidly obese in no acute distress.   HENT:     Head: Atraumatic.  Eyes:     Conjunctiva/sclera: Conjunctivae normal.  Neck:     Thyroid: No thyromegaly.     Comments: Short neck and difficult to evaluate JVP Cardiovascular:     Rate and Rhythm: Normal rate and regular rhythm.     Pulses:          Carotid pulses are 2+ on the right side and 2+ on the left side.      Dorsalis pedis pulses are 2+ on the right side and 2+ on the left side.       Posterior tibial pulses are 2+ on the right side and 2+ on the left side.     Heart sounds: Murmur heard.  Harsh midsystolic murmur is present at the upper right sternal border radiating to the neck.  No gallop.      Comments: Femoral and  popliteal pulse difficult to feel due to patient's body habitus.  trace+ pitting leg edema bilateral. No JVD.  Pulmonary:     Effort: Pulmonary effort is normal.     Breath sounds: Normal breath sounds.  Abdominal:     General: Bowel sounds are normal.     Palpations: Abdomen is soft.     Comments: Obese. Pannus present  Musculoskeletal:        General: Normal range  of motion.     Cervical back: Neck supple.  Skin:    General: Skin is warm and dry.  Neurological:     Mental Status: He is alert.    Laboratory examination:   Recent Labs    02/15/20 0853  NA 142  K 4.0  CL 106  CO2 27  GLUCOSE 128*  BUN 20  CREATININE 1.03  CALCIUM 9.6   CrCl cannot be calculated (Patient's most recent lab result is older than the maximum 21 days allowed.).  CMP Latest Ref Rng & Units 02/15/2020 03/05/2019 07/08/2018  Glucose 70 - 99 mg/dL 128(H) 139(H) 138(H)  BUN 6 - 23 mg/dL 20 22 18   Creatinine 0.40 - 1.50 mg/dL 1.03 1.07 1.01  Sodium 135 - 145 mEq/L 142 140 140  Potassium 3.5 - 5.1 mEq/L 4.0 4.0 3.8  Chloride 96 - 112 mEq/L 106 102 102  CO2 19 - 32 mEq/L 27 26 30   Calcium 8.4 - 10.5 mg/dL 9.6 9.7 9.7  Total Protein 6.0 - 8.3 g/dL 6.5 6.8 -  Total Bilirubin 0.2 - 1.2 mg/dL 0.9 1.0 -  Alkaline Phos 39 - 117 U/L 62 60 -  AST 0 - 37 U/L 23 28 -  ALT 0 - 53 U/L 18 20 -   CBC Latest Ref Rng & Units 02/15/2020 03/05/2019  WBC 4.0 - 10.5 K/uL 10.3 9.2  Hemoglobin 13.0 - 17.0 g/dL 14.7 12.9(L)  Hematocrit 39 - 52 % 46.0 41.9  Platelets 150 - 400 K/uL 154.0 191.0   Lipid Panel Recent Labs    02/15/20 0853  CHOL 103  TRIG 109.0  LDLCALC 44  VLDL 21.8  HDL 36.90*  CHOLHDL 3    HEMOGLOBIN A1C Lab Results  Component Value Date   HGBA1C 7.4 (H) 02/15/2020   TSH No results for input(s): TSH in the last 8760 hours. Medications and allergies  No Known Allergies   Current Outpatient Medications on File Prior to Visit  Medication Sig Dispense Refill  . allopurinol (ZYLOPRIM) 300  MG tablet TAKE 1 TABLET BY MOUTH  TWICE DAILY 30 tablet 0  . aspirin 81 MG tablet Take 81 mg by mouth daily.     . chlorthalidone (HYGROTON) 25 MG tablet TAKE 1 TABLET BY MOUTH EVERY MORNING 90 tablet 2  . Cholecalciferol (VITAMIN D) 2000 UNITS tablet Take 2,000 Units by mouth daily.    . diclofenac Sodium (VOLTAREN) 1 % GEL Apply 2 g topically 4 (four) times daily. 100 g 3  . docusate sodium (COLACE) 100 MG capsule Take 100 mg by mouth daily.    . empagliflozin (JARDIANCE) 25 MG TABS tablet Take 25 mg by mouth daily before breakfast. 30 tablet 11  . glipiZIDE (GLUCOTROL XL) 10 MG 24 hr tablet TAKE 1 TABLET BY MOUTH  DAILY 90 tablet 3  . glucose blood (ONE TOUCH ULTRA TEST) test strip Use as instructed to check blood sugar.  DX 250.00 100 each prn  . metFORMIN (GLUCOPHAGE) 1000 MG tablet TAKE 1 TABLET BY MOUTH  TWICE DAILY WITH MEALS 180 tablet 3  . metoprolol tartrate (LOPRESSOR) 50 MG tablet TAKE 1 TABLET BY MOUTH TWO  TIMES DAILY 180 tablet 3  . mometasone-formoterol (DULERA) 200-5 MCG/ACT AERO Inhale 2 puffs into the lungs 2 (two) times daily. (Patient taking differently: Inhale 2 puffs into the lungs daily as needed for wheezing or shortness of breath. ) 1 Inhaler 0  . ONE TOUCH LANCETS MISC Use as directed to check blood sugar.  DX  250.00 200 each prn  . pantoprazole (PROTONIX) 40 MG tablet TAKE 1 TABLET BY MOUTH  DAILY 30 tablet 0  . rosuvastatin (CRESTOR) 20 MG tablet TAKE 1 TABLET BY MOUTH AT  BEDTIME 90 tablet 3  . albuterol (PROAIR HFA) 108 (90 Base) MCG/ACT inhaler INHALE 2 PUFFS INTO THE LUNGS EVERY 4 HOURS AS NEEDED FOR WHEEZING 8.5 g 0  . fluticasone (FLONASE) 50 MCG/ACT nasal spray USE 2 SPRAYS IN BOTH  NOSTRILS DAILY AS NEEDED  FOR ALLERGIES. 48 g 0   No current facility-administered medications on file prior to visit.    Radiology:  No results found. Cardiac Studies:   TEE 06/06/10: Normal LVEF. Mod LVH, chordal SAM & LVOT obst with 145mm Hg PG. Not HOCM. No significant mitral  regurgitation.    Carotid Doppler  [10/11/2013]: No evidence of hemodynamically significant stenosis in the bilateral carotid bifurcation vessels, Tortuosity may be the source of bruit. No significant change from 05/28/2010.  Echocardiogram   11/12/2017: Left ventricle cavity is normal in size. Severe concentric hypertrophy of the left ventricle. Posterior and septal walls both measure 2 cm in AP dimension. Normal global wall motion. Doppler evidence of grade II (pseudonormal) diastolic dysfunction, elevated LAP. Calculated EF 55%. Hypertrophic cardiomyopathy. Left atrial cavity is mildly dilated, measuring 4.6 cm in AP dimension. Inadequate tricuspid regurgitation jet to estimate pulmonary artery pressure. Normal right atrial pressure. No significant change compared to prior study dated 12/17/2016.  Nuclear stress test   11/10/2017: 1. Lexiscan stress test was performed. Exercise capacity was not assessed. Stress symptoms included dyspnea. Peak blood pressure was 150/82 mmHg. The resting and stress electrocardiogram demonstrated normal sinus rhythm, normal resting conduction, frequent multifocal PAC;s, lateral T wave inversions. Stress EKG is non diagnostic for ischemia as it is a pharmacologic stress. 2. The overall quality of the study is good. Left ventricular cavity is noted to be normal on the rest and stress studies. Gated SPECT images reveal normal myocardial thickening and wall motion. The left ventricular ejection fraction was calculated to be 40%, although visually looks normal. SPECT images demonstrate small perfusion abnormality of mild intensity in the basal inferior and mid inferior myocardial wall(s) on the stress images with mild reversibility. 3. Intermediate risk study.  Coronary angiogram 01/06/2018:  Mid LAD 20% stenosis, otherwise no significant disease. Normal LVEF. 140 mmHg intraventricular PG noted. LVEDP moderate to severely elevated.  EKG:  EKG 03/10/2020: Normal sinus  rhythm at rate of 59 bpm, left atrial enlargement, leftward axis.  Incomplete right bundle branch block.  LVH with repolarization abnormality.  Consider hypertrophic cardiomyopathy.   No significant change from 03/03/2019.  Assessment     ICD-10-CM   1. Hypertrophic obstructive cardiomyopathy (HOCM) (HCC)  I42.1 EKG 12-Lead    verapamil (CALAN-SR) 120 MG CR tablet    DISCONTINUED: verapamil (CALAN-SR) 120 MG CR tablet  2. Primary insomnia  F51.01 traZODone (DESYREL) 50 MG tablet  3. Essential hypertension  I10 verapamil (CALAN-SR) 120 MG CR tablet    DISCONTINUED: verapamil (CALAN-SR) 120 MG CR tablet  4. Pure hypercholesterolemia  E78.00     Meds ordered this encounter  Medications  . traZODone (DESYREL) 50 MG tablet    Sig: Take 1 tablet (50 mg total) by mouth at bedtime.    Dispense:  30 tablet    Refill:  1  . DISCONTD: verapamil (CALAN-SR) 120 MG CR tablet    Sig: Take 1.5 tablets (180 mg total) by mouth every morning.    Dispense:  90  tablet    Refill:  3  . verapamil (CALAN-SR) 120 MG CR tablet    Sig: Take 1 tablet (120 mg total) by mouth every morning.    Dispense:  90 tablet    Refill:  3    Please do not fill 180 mg Rx   Medications Discontinued During This Encounter  Medication Reason  . verapamil (VERELAN PM) 120 MG 24 hr capsule Cost of medication  . verapamil (CALAN-SR) 120 MG CR tablet      Recommendations:   Meds ordered this encounter  Medications  . traZODone (DESYREL) 50 MG tablet    Sig: Take 1 tablet (50 mg total) by mouth at bedtime.    Dispense:  30 tablet    Refill:  1  . DISCONTD: verapamil (CALAN-SR) 120 MG CR tablet    Sig: Take 1.5 tablets (180 mg total) by mouth every morning.    Dispense:  90 tablet    Refill:  3  . verapamil (CALAN-SR) 120 MG CR tablet    Sig: Take 1 tablet (120 mg total) by mouth every morning.    Dispense:  90 tablet    Refill:  3    Please do not fill 180 mg Rx    Jack [name]  is a 74 y.o.  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   His blood pressure is well controlled, from cardiac standpoint no change in his LVOT murmur, no clinical evidence of acute decompensated heart failure.  He has very mild bilateral leg edema which is stable.  He is requesting a change his verapamil capsules to tablet form due to insurance reasons, new prescription sent. I reviewed his labs, lipids are also under excellent control.  He has been compliant with CPAP.  For his insomnia, I have prescribed him trazodone, if this does not work, I advised him to discuss with Dr. Ernestine Conrad his PCP.  We also discussed regarding good sleeping habits.  I will see him back in a year or sooner if problems.   Adrian Prows, MD, [facility] 03/10/2020, 9:16 AM Office: [phone] Pager: [phone]

## 2020-03-14 ENCOUNTER — Telehealth: Payer: Self-pay

## 2020-03-14 NOTE — Telephone Encounter (Signed)
[facility] sent a MEDICAL CLARIFICATION REQUEST :  Because both :   Verapamil 120mg  1.5 tab once daily #90 3RF     Verapamil 120mg  1 tab once daily    #90 3RF  Were e-prescribed.   I called patient and his wife said that he has always been taking the Verapamil 120mg  1 tab daily. His wife stated that it was not changed. But after looking at the chart, it was changed from  [facility]) 1.5mg  tab daily to (CALAN-SR) 1mg  daily.   Please let me know what to send back to [facility] and what to tell patient on which is the correct one for him to be taking.         Notes on this medication :

## 2020-03-15 ENCOUNTER — Ambulatory Visit (INDEPENDENT_AMBULATORY_CARE_PROVIDER_SITE_OTHER): Payer: Medicare Other

## 2020-03-15 VITALS — Ht 69.0 in | Wt 258.0 lb

## 2020-03-15 DIAGNOSIS — Z0000 Encounter for general adult medical examination without abnormal findings: Secondary | ICD-10-CM

## 2020-03-15 NOTE — Progress Notes (Signed)
Subjective:   Jack [name] is a 74 y.o. male who presents for Medicare Annual/Subsequent preventive examination.  I connected with Ansar today by telephone and verified that I am speaking with the correct person using two identifiers. Location patient: home Location provider: work Persons participating in the virtual visit: patient, Marine scientist.    I discussed the limitations, risks, security and privacy concerns of performing an evaluation and management service by telephone and the availability of in person appointments. I also discussed with the patient that there may be a patient responsible charge related to this service. The patient expressed understanding and verbally consented to this telephonic visit.    Interactive audio and video telecommunications were attempted between this provider and patient, however failed, due to patient having technical difficulties OR patient did not have access to video capability.  We continued and completed visit with audio only.  Some vital signs may be absent or patient reported.   Time Spent with patient on telephone encounter: 20 minutes  Review of Systems     Cardiac Risk Factors include: advanced age (>37men, >6 women);male gender;diabetes mellitus;hypertension;obesity (BMI >30kg/m2)     Objective:    Today's Vitals   03/15/20 1312  Weight: 258 lb (117 kg)  Height: 5\' 9"  (1.753 m)   Body mass index is 38.1 kg/m.  Advanced Directives 03/15/2020 03/06/2018 01/06/2018 02/28/2017  Does Patient Have a Medical Advance Directive? Yes Yes Yes Yes  Type of Paramedic of [place];Living will Living will;Healthcare Power of [place];Living will [place];Living will  Does patient want to make changes to medical advance directive? - - No - Patient declined -  Copy of [place] in Chart? No - copy requested No - copy requested Yes No - copy requested     Current Medications (verified) Outpatient Encounter Medications as of 03/15/2020  Medication Sig  . albuterol (PROAIR HFA) 108 (90 Base) MCG/ACT inhaler INHALE 2 PUFFS INTO THE LUNGS EVERY 4 HOURS AS NEEDED FOR WHEEZING  . allopurinol (ZYLOPRIM) 300 MG tablet TAKE 1 TABLET BY MOUTH  TWICE DAILY  . aspirin 81 MG tablet Take 81 mg by mouth daily.   . chlorthalidone (HYGROTON) 25 MG tablet TAKE 1 TABLET BY MOUTH EVERY MORNING  . Cholecalciferol (VITAMIN D) 2000 UNITS tablet Take 2,000 Units by mouth daily.  . diclofenac Sodium (VOLTAREN) 1 % GEL Apply 2 g topically 4 (four) times daily.  Marland Kitchen docusate sodium (COLACE) 100 MG capsule Take 100 mg by mouth daily.  . empagliflozin (JARDIANCE) 25 MG TABS tablet Take 25 mg by mouth daily before breakfast.  . fluticasone (FLONASE) 50 MCG/ACT nasal spray USE 2 SPRAYS IN BOTH  NOSTRILS DAILY AS NEEDED  FOR ALLERGIES.  Marland Kitchen glipiZIDE (GLUCOTROL XL) 10 MG 24 hr tablet TAKE 1 TABLET BY MOUTH  DAILY  . glucose blood (ONE TOUCH ULTRA TEST) test strip Use as instructed to check blood sugar.  DX 250.00  . metFORMIN (GLUCOPHAGE) 1000 MG tablet TAKE 1 TABLET BY MOUTH  TWICE DAILY WITH MEALS  . metoprolol tartrate (LOPRESSOR) 50 MG tablet TAKE 1 TABLET BY MOUTH TWO  TIMES DAILY  . mometasone-formoterol (DULERA) 200-5 MCG/ACT AERO Inhale 2 puffs into the lungs 2 (two) times daily. (Patient taking differently: Inhale 2 puffs into the lungs daily as needed for wheezing or shortness of breath. )  . ONE TOUCH LANCETS MISC Use as directed to check blood sugar.  DX 250.00  . pantoprazole (  PROTONIX) 40 MG tablet TAKE 1 TABLET BY MOUTH  DAILY  . rosuvastatin (CRESTOR) 20 MG tablet TAKE 1 TABLET BY MOUTH AT  BEDTIME  . traZODone (DESYREL) 50 MG tablet Take 1 tablet (50 mg total) by mouth at bedtime.  . verapamil (CALAN-SR) 120 MG CR tablet Take 1 tablet (120 mg total) by mouth every morning.   No facility-administered encounter medications on file as of 03/15/2020.     Allergies (verified) Patient has no known allergies.   History: Past Medical History:  Diagnosis Date  . Allergy   . CAD (coronary artery disease)    see PSH section for details  . Carotid bruit 05/28/10   Tortuosity of carotids on doppler u/s, no stenosis.  . Cataract   . Chronic diastolic heart failure (HCC)    Grd II DD  . Colon cancer screening    cologuard POSITIVE 09/2017-->colonoscopy 12/19/17; multiple adenomatous polyps, recall 3 yrs.  Marland Kitchen COPD (chronic obstructive pulmonary disease) ([place])   . Diabetes mellitus    type 2- non insulin-requiring  . DOE (dyspnea on exertion)    Cardiology w/u unrevealing except LVOT obstruction: suspect dyspnea due to HOCM, morbid obesity and OSA--stable as of 02/2017 cardiol f/u.  Marland Kitchen Fatty liver u/s and CT 2009   w/mildly elevated transaminases; u/s 12/2012 reconfirmed dx.  Hep B and C testing negative.  Marland Kitchen GERD (gastroesophageal reflux disease)    EDG 04-03-01, + distal esophageal stricture dilation  . Gout   . Heart murmur   . Hiatal hernia   . Hx of adenomatous colonic polyps 12/27/2017  . Hyperlipidemia   . Hypertension    severe LVH on echo  . 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 Grd II DD.    . Iron deficiency anemia 02/2019   home hemoccults ordered but pt never returned these.  GI referral was done but pt did not return their call/unable to be contacted.  . Moderate persistent asthma 04/19/2014  . Morbid obesity ([place])   . OSA on CPAP    Could not tolerate CPAP, not surgical candidate per Dr. Benjamine Mola.  Restarted CPAP (auto titrate 5-15 with full facemask--Dr. Alva 2017.  Getting mask fitting/leak issues worked out as of 07/2016 pulm f/u.  . Osteoarthritis, multiple sites    primarily knees and back.  . Stricture and stenosis of esophagus   . Venous  insufficiency of both lower extremities    Past Surgical History:  Procedure Laterality Date  . CARDIOVASCULAR STRESS TEST  2006; 04/27/10; 10/2017   Normal stress nuclear study, normal EF.  2019: EF 40%, inferior ischemia--intermediate risk study-->Dr. Einar Gip to do cath.  . COLONOSCOPY  x 3   Most recent-->12/19/17 (after + cologuard) multiple adenomatous polyps-->recall 3 yrs.  Marland Kitchen LEFT HEART CATH AND CORONARY ANGIOGRAPHY N/A 01/06/2018   One area of 80% ostial stenosis--small and hard to visualize. No intervention.  DAPT for >1 yr recommended.  Procedure: LEFT HEART CATH AND CORONARY ANGIOGRAPHY;  Surgeon: Adrian Prows, MD;  Location: [facility];  Service: Cardiovascular;  Laterality: N/A;  . NASAL SINUS SURGERY    . Sleep study  07/2016   OSA; CPAP trial at 10 cm H20 recommended by pulm (Dr. Tennis Must Dios--[place] pulm).  . [place]   ? Discectomy  . TEE WITHOUT CARDIOVERSION  05/2010   Dr.  Ganji; severe LVH, no valvular abnormalities  . TRANSESOPHAGEAL ECHOCARDIOGRAM  06/06/10; 12/2011; 09/2014; 11/2016   LVOT obst with 135 mm Hg PG. HOCM. No AV obstruction to flow. No mitral regurg..  Repeat echo 01/09/12 no change.  2016: EF 60%, severe conc LV hypertrophy, normal global wall motion, LA mildly dilated, systolic anterior motion of mitral valve chord, mild MR, mild TR, mild pulm HTN--no signif change since 2013.  Repeat echo 11/2016 and 10/2017 no signif change compared to prior studies.  . TRANSTHORACIC ECHOCARDIOGRAM  05/28/2010; 10/2017   2012; Normal LVEF, LVOT obstruction, lateral wall hypokinesis.  10/2017: EF 55%, hypertrophic CM, grd II dd, normal global wall motion.   Family History  Problem Relation Age of Onset  . Diabetes Mother   . Prostate cancer Father        Prostate  . Breast cancer Sister   . Diabetes Maternal Aunt   . Breast cancer Maternal Aunt   . Hyperlipidemia Sister   . Hypertension Neg Hx   . Coronary artery disease Neg Hx   . Colon cancer Neg Hx   . Stomach  cancer Neg Hx   . Rectal cancer Neg Hx   . Esophageal cancer Neg Hx    Social History   Socioeconomic History  . Marital status: Married    Spouse name: Not on file  . Number of children: 1  . Years of education: Not on file  . Highest education level: Not on file  Occupational History  . Occupation: retired    Fish farm manager: RETIRED  Tobacco Use  . Smoking status: Former Smoker    Packs/day: 0.50    Years: 40.00    Pack years: 20.00    Types: Cigarettes, Cigars    Quit date: 04/29/1998    Years since quitting: 21.8  . Smokeless tobacco: Never Used  . Tobacco comment: 4 cigars  Vaping Use  . Vaping Use: Never used  Substance and Sexual Activity  . Alcohol use: Yes    Comment: occasionally  . Drug use: No  . Sexual activity: Not on file  Other Topics Concern  . Not on file  Social History Narrative   Married, 1 son, 1 granddaughter.   Retired from [employer] 2000.   Hobbies: trading farm equipment.  Used to be a Environmental manager.   Tobacco x many years, quit @ 2005   No ETOH or drugs.   No regular exercise.Smoking Status:  quit            Social Determinants of Health   Financial Resource Strain: Low Risk   . Difficulty of Paying Living Expenses: Not hard at all  Food Insecurity: No Food Insecurity  . Worried About Charity fundraiser in the Last Year: Never true  . Ran Out of Food in the Last Year: Never true  Transportation Needs: No Transportation Needs  . Lack of Transportation (Medical): No  . Lack of Transportation (Non-Medical): No  Physical Activity: Sufficiently Active  . Days of Exercise per Week: 7 days  . Minutes of Exercise per Session: 30 min  Stress: No Stress Concern Present  . Feeling of Stress : Not at all  Social Connections: Moderately Integrated  . Frequency of Communication with Friends and Family: More than three times a week  . Frequency of Social Gatherings with Friends and Family: Once a week  . Attends Religious  Services: More than 4 times per year  . Active Member of Clubs or Organizations: No  .  Attends Archivist Meetings: Never  . Marital Status: Married    Tobacco Counseling Counseling given: Not Answered Comment: 4 cigars   Clinical Intake:  Pre-visit preparation completed: Yes  Pain : No/denies pain     Nutritional Status: BMI > 30  Obese Nutritional Risks: None Diabetes: Yes CBG done?: No Did pt. bring in CBG monitor from home?: No (phone visit)  How often do you need to have someone help you when you read instructions, pamphlets, or other written materials from your doctor or pharmacy?: 1 - Never What is the last grade level you completed in school?: 12th grade Diabetes:  Is the patient diabetic?  Yes  If diabetic, was a CBG obtained today?  No  Did the patient bring in their glucometer from home?  No phone visit How often do you monitor your CBG's? occasionally.   Financial Strains and Diabetes Management:  Are you having any financial strains with the device, your supplies or your medication? No .  Does the patient want to be seen by Chronic Care Management for management of their diabetes?  No  Would the patient like to be referred to a Nutritionist or for Diabetic Management?  No   Diabetic Exams:  Diabetic Eye Exam: Completed 09/21/2019 .   Diabetic Foot Exam:  Pt has been advised about the importance in completing this exam. To be completed by PCP    Interpreter Needed?: No  Information entered by :: Caroleen Hamman LPN   Activities of Daily Living In your present state of health, do you have any difficulty performing the following activities: 03/15/2020  Hearing? N  Vision? N  Difficulty concentrating or making decisions? N  Walking or climbing stairs? N  Dressing or bathing? N  Doing errands, shopping? N  Preparing Food and eating ? N  Using the Toilet? N  In the past six months, have you accidently leaked urine? N  Do you have problems  with loss of bowel control? N  Managing your Medications? N  Managing your Finances? N  Housekeeping or managing your Housekeeping? N  Some recent data might be hidden    Patient Care Team: Tammi Sou, MD as PCP - General Adrian Prows, MD as Consulting Physician (Cardiology) Chesley Mires, MD as Consulting Physician (Pulmonary Disease) Izora Gala, MD as Consulting Physician (Otolaryngology) Rutherford Guys, MD as Consulting Physician (Ophthalmology) Inocencio Homes, DPM as Consulting Physician (Podiatry) Rigoberto Noel, MD as Consulting Physician (Pulmonary Disease) [place], Mike Gip, MD as Consulting Physician (Pulmonary Disease) Gatha Mayer, MD as Consulting Physician (Gastroenterology) Darreld Mclean, MD as Referring Physician (Dermatology)  Indicate any recent Medical Services you may have received from other than [facility] providers in the past year (date may be approximate).     Assessment:   This is a routine wellness examination for [place].  Hearing/Vision screen  Hearing Screening   125Hz  250Hz  500Hz  1000Hz  2000Hz  3000Hz  4000Hz  6000Hz  8000Hz   Right ear:           Left ear:           Comments: No issues   Vision Screening Comments: Last eye exam-2020-Dr. Gershon Crane  Dietary issues and exercise activities discussed: Current Exercise Habits: Home exercise routine, Type of exercise: walking, Time (Minutes): 30, Frequency (Times/Week): 7, Weekly Exercise (Minutes/Week): 210, Exercise limited by: None identified  Goals    . Weight (lb) < 265 lb (120.2 kg)     Lose weight by watching food intake.  Depression Screen PHQ 2/9 Scores 03/15/2020 03/05/2019 03/06/2018 02/28/2017 11/28/2015 08/15/2015  PHQ - 2 Score 0 0 0 0 0 0    Fall Risk Fall Risk  03/15/2020 03/05/2019 03/06/2018 02/28/2017 02/28/2017  Falls in the past year? 0 1 1 No No  Comment - - Jumped off tractor - -  Number falls in past yr: 0 0 0 - -  Injury with Fall? 0 1 0 - -  Follow up Falls prevention  discussed Falls evaluation completed - - -    Any stairs in or around the home? No  Home free of loose throw rugs in walkways, pet beds, electrical cords, etc? Yes  Adequate lighting in your home to reduce risk of falls? Yes   ASSISTIVE DEVICES UTILIZED TO PREVENT FALLS:  Life alert? No  Use of a cane, walker or w/c? No  Grab bars in the bathroom? Yes  Shower chair or bench in shower? No  Elevated toilet seat or a handicapped toilet? No   TIMED UP AND GO:  Was the test performed? No . Phone visit  Cognitive Function:No cognitive impairment noted MMSE - Mini Mental State Exam 03/06/2018  Orientation to time 5  Orientation to Place 5  Registration 3  Attention/ Calculation 5  Recall 2  Language- name 2 objects 2  Language- repeat 1  Language- follow 3 step command 3  Language- read & follow direction 1  Write a sentence 1  Copy design 1  Total score 29        Immunizations Immunization History  Administered Date(s) Administered  . Fluad Quad(high Dose 65+) 01/25/2020  . Hepatitis B 06/08/2012, 07/10/2012  . Hepatitis B, adult 11/30/2012  . Influenza Split 02/26/2011, 01/16/2012  . Influenza Whole 12/28/2009  . Influenza, High Dose Seasonal PF 01/24/2016, 12/29/2016, 12/19/2018  . Influenza,inj,Quad PF,6+ Mos 02/05/2013, 02/02/2014, 01/24/2016  . Influenza-Unspecified 02/04/2015, 01/16/2018  . Moderna SARS-COVID-2 Vaccination 05/20/2019, 06/25/2019  . Pneumococcal Conjugate-13 08/03/2013  . Pneumococcal Polysaccharide-23 06/06/2011, 01/21/2012, 02/28/2017  . Td 04/30/2007  . Tdap 03/12/2018  . Zoster 12/03/2011  . Zoster Recombinat (Shingrix) 03/12/2018, 05/12/2018    TDAP status: Up to date   Flu Vaccine status: Up to date   Pneumococcal vaccine status: Up to date   Covid-19 vaccine status: Completed vaccines  Qualifies for Shingles Vaccine? No   Zostavax completed Yes   Shingrix Completed?: Yes  Screening Tests Health Maintenance  Topic Date Due   . FOOT EXAM  03/04/2020  . HEMOGLOBIN A1C  08/15/2020  . OPHTHALMOLOGY EXAM  09/20/2020  . COLONOSCOPY  12/19/2020  . URINE MICROALBUMIN  02/14/2021  . TETANUS/TDAP  03/12/2028  . INFLUENZA VACCINE  Completed  . COVID-19 Vaccine  Completed  . Hepatitis C Screening  Completed  . PNA vac Low Risk Adult  Completed    Health Maintenance  Health Maintenance Due  Topic Date Due  . FOOT EXAM  03/04/2020    Colorectal cancer screening: Completed Colonoscopy 12/19/2017. Repeat every 3 years  Lung Cancer Screening: (Low Dose CT Chest recommended if Age 33-80 years, 30 pack-year currently smoking OR have quit w/in 15years.) does not qualify.     Additional Screening:  Hepatitis C Screening: Completed 12/25/2012  Vision Screening: Recommended annual ophthalmology exams for early detection of glaucoma and other disorders of the eye. Is the patient up to date with their annual eye exam?  Yes  Who is the provider or what is the name of the office in which the patient attends annual eye  exams? Dr. Gershon Crane   Dental Screening: Recommended annual dental exams for proper oral hygiene  Community Resource Referral / Chronic Care Management: CRR required this visit?  No   CCM required this visit?  No      Plan:     I have personally reviewed and noted the following in the patient's chart:   . Medical and social history . Use of alcohol, tobacco or illicit drugs  . Current medications and supplements . Functional ability and status . Nutritional status . Physical activity . Advanced directives . List of other physicians . Hospitalizations, surgeries, and ER visits in previous 12 months . Vitals . Screenings to include cognitive, depression, and falls . Referrals and appointments  In addition, I have reviewed and discussed with patient certain preventive protocols, quality metrics, and best practice recommendations. A written personalized care plan for preventive services as well  as general preventive health recommendations were provided to patient.   Due to this being a telephonic visit, the after visit summary with patients personalized plan was offered to patient via mail or my-chart. Patient would like to access on my-chart.   Marta Antu, LPN   [id number]  Nurse Health Advisor  Nurse Notes: None

## 2020-03-15 NOTE — Patient Instructions (Signed)
Jack [name] , Thank you for taking time to complete your Medicare Wellness Visit. I appreciate your ongoing commitment to your health goals. Please review the following plan we discussed and let me know if I can assist you in the future.   Screening recommendations/referrals: Colonoscopy: Completed Recommended yearly ophthalmology/optometry visit for glaucoma screening and checkup Recommended yearly dental visit for hygiene and checkup  Vaccinations: Influenza vaccine: Up to date Pneumococcal vaccine: Completed vaccines Tdap vaccine: Up to date- Due-03/12/2028 Shingles vaccine: Completed vaccines   Covid-19: Completed vaccines  Advanced directives: Please bring a copy for your chart  Conditions/risks identified: See problem list  Next appointment: Follow up in one year for your annual wellness visit. 03/28/2021 @ 1:30pm  Preventive Care 73 Years and Older, Male Preventive care refers to lifestyle choices and visits with your health care provider that can promote health and wellness. What does preventive care include?  A yearly physical exam. This is also called an annual well check.  Dental exams once or twice a year.  Routine eye exams. Ask your health care provider how often you should have your eyes checked.  Personal lifestyle choices, including:  Daily care of your teeth and gums.  Regular physical activity.  Eating a healthy diet.  Avoiding tobacco and drug use.  Limiting alcohol use.  Practicing safe sex.  Taking low doses of aspirin every day.  Taking vitamin and mineral supplements as recommended by your health care provider. What happens during an annual well check? The services and screenings done by your health care provider during your annual well check will depend on your age, overall health, lifestyle risk factors, and family history of disease. Counseling  Your health care provider may ask you questions about your:  Alcohol use.  Tobacco  use.  Drug use.  Emotional well-being.  Home and relationship well-being.  Sexual activity.  Eating habits.  History of falls.  Memory and ability to understand (cognition).  Work and work Statistician. Screening  You may have the following tests or measurements:  Height, weight, and BMI.  Blood pressure.  Lipid and cholesterol levels. These may be checked every 5 years, or more frequently if you are over 20 years old.  Skin check.  Lung cancer screening. You may have this screening every year starting at age 73 if you have a 30-pack-year history of smoking and currently smoke or have quit within the past 15 years.  Fecal occult blood test (FOBT) of the stool. You may have this test every year starting at age 73.  Flexible sigmoidoscopy or colonoscopy. You may have a sigmoidoscopy every 5 years or a colonoscopy every 10 years starting at age 73.  Prostate cancer screening. Recommendations will vary depending on your family history and other risks.  Hepatitis C blood test.  Hepatitis B blood test.  Sexually transmitted disease (STD) testing.  Diabetes screening. This is done by checking your blood sugar (glucose) after you have not eaten for a while (fasting). You may have this done every 1-3 years.  Abdominal aortic aneurysm (AAA) screening. You may need this if you are a current or former smoker.  Osteoporosis. You may be screened starting at age 73 if you are at high risk. Talk with your health care provider about your test results, treatment options, and if necessary, the need for more tests. Vaccines  Your health care provider may recommend certain vaccines, such as:  Influenza vaccine. This is recommended every year.  Tetanus, diphtheria, and acellular pertussis (Tdap,  Td) vaccine. You may need a Td booster every 10 years.  Zoster vaccine. You may need this after age 63.  Pneumococcal 13-valent conjugate (PCV13) vaccine. One dose is recommended after age  25.  Pneumococcal polysaccharide (PPSV23) vaccine. One dose is recommended after age 35. Talk to your health care provider about which screenings and vaccines you need and how often you need them. This information is not intended to replace advice given to you by your health care provider. Make sure you discuss any questions you have with your health care provider. Document Released: 05/12/2015 Document Revised: 01/03/2016 Document Reviewed: 02/14/2015 Elsevier Interactive Patient Education  2017 [place] Prevention in the Home Falls can cause injuries. They can happen to people of all ages. There are many things you can do to make your home safe and to help prevent falls. What can I do on the outside of my home?  Regularly fix the edges of walkways and driveways and fix any cracks.  Remove anything that might make you trip as you walk through a door, such as a raised step or threshold.  Trim any bushes or trees on the path to your home.  Use bright outdoor lighting.  Clear any walking paths of anything that might make someone trip, such as rocks or tools.  Regularly check to see if handrails are loose or broken. Make sure that both sides of any steps have handrails.  Any raised decks and porches should have guardrails on the edges.  Have any leaves, snow, or ice cleared regularly.  Use sand or salt on walking paths during winter.  Clean up any spills in your garage right away. This includes oil or grease spills. What can I do in the bathroom?  Use night lights.  Install grab bars by the toilet and in the tub and shower. Do not use towel bars as grab bars.  Use non-skid mats or decals in the tub or shower.  If you need to sit down in the shower, use a plastic, non-slip stool.  Keep the floor dry. Clean up any water that spills on the floor as soon as it happens.  Remove soap buildup in the tub or shower regularly.  Attach bath mats securely with double-sided  non-slip rug tape.  Do not have throw rugs and other things on the floor that can make you trip. What can I do in the bedroom?  Use night lights.  Make sure that you have a light by your bed that is easy to reach.  Do not use any sheets or blankets that are too big for your bed. They should not hang down onto the floor.  Have a firm chair that has side arms. You can use this for support while you get dressed.  Do not have throw rugs and other things on the floor that can make you trip. What can I do in the kitchen?  Clean up any spills right away.  Avoid walking on wet floors.  Keep items that you use a lot in easy-to-reach places.  If you need to reach something above you, use a strong step stool that has a grab bar.  Keep electrical cords out of the way.  Do not use floor polish or wax that makes floors slippery. If you must use wax, use non-skid floor wax.  Do not have throw rugs and other things on the floor that can make you trip. What can I do with my stairs?  Do not  leave any items on the stairs.  Make sure that there are handrails on both sides of the stairs and use them. Fix handrails that are broken or loose. Make sure that handrails are as long as the stairways.  Check any carpeting to make sure that it is firmly attached to the stairs. Fix any carpet that is loose or worn.  Avoid having throw rugs at the top or bottom of the stairs. If you do have throw rugs, attach them to the floor with carpet tape.  Make sure that you have a light switch at the top of the stairs and the bottom of the stairs. If you do not have them, ask someone to add them for you. What else can I do to help prevent falls?  Wear shoes that:  Do not have high heels.  Have rubber bottoms.  Are comfortable and fit you well.  Are closed at the toe. Do not wear sandals.  If you use a stepladder:  Make sure that it is fully opened. Do not climb a closed stepladder.  Make sure that both  sides of the stepladder are locked into place.  Ask someone to hold it for you, if possible.  Clearly mark and make sure that you can see:  Any grab bars or handrails.  First and last steps.  Where the edge of each step is.  Use tools that help you move around (mobility aids) if they are needed. These include:  Canes.  Walkers.  Scooters.  Crutches.  Turn on the lights when you go into a dark area. Replace any light bulbs as soon as they burn out.  Set up your furniture so you have a clear path. Avoid moving your furniture around.  If any of your floors are uneven, fix them.  If there are any pets around you, be aware of where they are.  Review your medicines with your doctor. Some medicines can make you feel dizzy. This can increase your chance of falling. Ask your doctor what other things that you can do to help prevent falls. This information is not intended to replace advice given to you by your health care provider. Make sure you discuss any questions you have with your health care provider. Document Released: 02/09/2009 Document Revised: 09/21/2015 Document Reviewed: 05/20/2014 Elsevier Interactive Patient Education  2017 [employer].

## 2020-03-16 NOTE — Telephone Encounter (Signed)
120 mg daily (1 tablet daily). It was a mistake I sent for 1.5 tablets and I corrected it  (do nto fill 180 mg Rx when I sent the second Rx)

## 2020-03-16 NOTE — Telephone Encounter (Signed)
Called [facility] to correct medication.

## 2020-04-01 ENCOUNTER — Other Ambulatory Visit: Payer: Self-pay | Admitting: Family Medicine

## 2020-04-01 ENCOUNTER — Other Ambulatory Visit: Payer: Self-pay | Admitting: Cardiology

## 2020-04-04 ENCOUNTER — Telehealth: Payer: Self-pay | Admitting: Family Medicine

## 2020-04-04 NOTE — Telephone Encounter (Signed)
Patient's wife called to give the following information: Flu shot received 01/04/20 Covid booster received 02/25/20.

## 2020-04-04 NOTE — Telephone Encounter (Signed)
Immunization record updated.

## 2020-04-10 ENCOUNTER — Other Ambulatory Visit: Payer: Self-pay | Admitting: Family Medicine

## 2020-04-10 ENCOUNTER — Telehealth: Payer: Self-pay

## 2020-04-10 NOTE — Telephone Encounter (Signed)
Pt called and said that the sleeping meds you prescribed are not helping and asked if there was something else you can give him?

## 2020-04-10 NOTE — Telephone Encounter (Signed)
If it did not help, best option to discuss with PCP.

## 2020-04-11 ENCOUNTER — Other Ambulatory Visit: Payer: Self-pay

## 2020-04-11 MED ORDER — ALLOPURINOL 300 MG PO TABS: ORAL_TABLET | ORAL | 1 refills | Status: DC

## 2020-04-12 ENCOUNTER — Other Ambulatory Visit: Payer: Self-pay

## 2020-04-12 DIAGNOSIS — I10 Essential (primary) hypertension: Secondary | ICD-10-CM

## 2020-04-12 DIAGNOSIS — I421 Obstructive hypertrophic cardiomyopathy: Secondary | ICD-10-CM

## 2020-04-12 MED ORDER — VERAPAMIL HCL ER 120 MG PO TBCR: 120.0000 mg | EXTENDED_RELEASE_TABLET | ORAL | 3 refills | Status: DC

## 2020-05-19 ENCOUNTER — Other Ambulatory Visit: Payer: Self-pay | Admitting: Cardiology

## 2020-05-19 DIAGNOSIS — F5101 Primary insomnia: Secondary | ICD-10-CM

## 2020-05-22 DIAGNOSIS — H903 Sensorineural hearing loss, bilateral: Secondary | ICD-10-CM | POA: Diagnosis not present

## 2020-05-26 ENCOUNTER — Ambulatory Visit (INDEPENDENT_AMBULATORY_CARE_PROVIDER_SITE_OTHER): Payer: Medicare Other

## 2020-05-26 ENCOUNTER — Other Ambulatory Visit: Payer: Self-pay

## 2020-05-26 DIAGNOSIS — E611 Iron deficiency: Secondary | ICD-10-CM

## 2020-05-26 DIAGNOSIS — E119 Type 2 diabetes mellitus without complications: Secondary | ICD-10-CM | POA: Diagnosis not present

## 2020-05-26 LAB — CBC
HCT: 53.2 % — ABNORMAL HIGH (ref 39.0–52.0)
Hemoglobin: 17.7 g/dL — ABNORMAL HIGH (ref 13.0–17.0)
MCHC: 33.2 g/dL (ref 30.0–36.0)
MCV: 89.8 fl (ref 78.0–100.0)
Platelets: 148 10*3/uL — ABNORMAL LOW (ref 150.0–400.0)
RBC: 5.93 Mil/uL — ABNORMAL HIGH (ref 4.22–5.81)
RDW: 18.4 % — ABNORMAL HIGH (ref 11.5–15.5)
WBC: 8.3 10*3/uL (ref 4.0–10.5)

## 2020-05-26 LAB — HEMOGLOBIN A1C: Hgb A1c MFr Bld: 6.9 % — ABNORMAL HIGH (ref 4.6–6.5)

## 2020-05-27 LAB — IRON,TIBC AND FERRITIN PANEL
%SAT: 26 % (calc) (ref 20–48)
Ferritin: 44 ng/mL (ref 24–380)
Iron: 93 ug/dL (ref 50–180)
TIBC: 353 mcg/dL (calc) (ref 250–425)

## 2020-05-29 ENCOUNTER — Encounter: Payer: Self-pay | Admitting: Family Medicine

## 2020-06-01 ENCOUNTER — Other Ambulatory Visit: Payer: Medicare Other

## 2020-06-17 ENCOUNTER — Ambulatory Visit
Admission: EM | Admit: 2020-06-17 | Discharge: 2020-06-17 | Disposition: A | Discharge status: Home / Self Care | Payer: Medicare Other | Attending: Family Medicine | Admitting: Family Medicine

## 2020-06-17 ENCOUNTER — Encounter: Payer: Self-pay | Admitting: Emergency Medicine

## 2020-06-17 ENCOUNTER — Other Ambulatory Visit: Payer: Self-pay

## 2020-06-17 DIAGNOSIS — R197 Diarrhea, unspecified: Secondary | ICD-10-CM | POA: Diagnosis not present

## 2020-06-17 NOTE — Discharge Instructions (Signed)
Labs are pending   We will be in contact with any abnormal results  Follow up with this office or with primary care if symptoms are persisting.  Follow up in the ER for high fever, trouble swallowing, trouble breathing, other concerning symptoms.

## 2020-06-17 NOTE — ED Provider Notes (Signed)
[facility]   [id number] 06/17/20 Arrival Time: 0947  CC: ABDOMINAL PAIN  SUBJECTIVE:  Jack [name] is a 75 y.o. male who presents with diarrhea for the last week. Denies a precipitating event, trauma, close contacts with similar symptoms, recent travel or antibiotic use. Reports abdominal cramping. Reports that he had a respiratory virus previous to diarrhea onset. Has been using imodium and Pedialyte. Denies alleviating or aggravating factors. Denies similar symptoms in the past. Last BM this morning.    Denies fever, chills, appetite changes, weight changes, chest pain, SOB, constipation, hematochezia, melena, dysuria, difficulty urinating, increased frequency or urgency, flank pain, loss of bowel or bladder function      ROS: As per HPI.  All other pertinent ROS negative.     Past Medical History:  Diagnosis Date  . Allergy   . CAD (coronary artery disease)    see PSH section for details  . Carotid bruit 05/28/10   Tortuosity of carotids on doppler u/s, no stenosis.  . Cataract   . Chronic diastolic heart failure (HCC)    Grd II DD  . Colon cancer screening    cologuard POSITIVE 09/2017-->colonoscopy 12/19/17; multiple adenomatous polyps, recall 3 yrs.  Marland Kitchen COPD (chronic obstructive pulmonary disease) ([place])   . Diabetes mellitus    type 2- non insulin-requiring  . DOE (dyspnea on exertion)    Cardiology w/u unrevealing except LVOT obstruction: suspect dyspnea due to HOCM, morbid obesity and OSA--stable as of 02/2017 cardiol f/u.  Marland Kitchen Fatty liver u/s and CT 2009   w/mildly elevated transaminases; u/s 12/2012 reconfirmed dx.  Hep B and C testing negative.  Marland Kitchen GERD (gastroesophageal reflux disease)    EDG 04-03-01, + distal esophageal stricture dilation  . Gout   . Heart murmur   . Hiatal hernia   . History of iron deficiency anemia 02/2019   Hb 12.9 02/2019->home hemoccults ordered but pt never returned these.  GI referral was done but pt did not return their call/unable  to be contacted. Fall 2021 iron low but Hb normal->iron supp brought iron levels up (hemoccults neg x 3).  Marland Kitchen Hx of adenomatous colonic polyps 12/27/2017  . Hyperlipidemia   . Hypertension    severe LVH on echo  . 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 Grd II DD.    . Moderate persistent asthma 04/19/2014  . Morbid obesity ([place])   . OSA on CPAP    Could not tolerate CPAP, not surgical candidate per Dr. Benjamine Mola.  Restarted CPAP (auto titrate 5-15 with full facemask--Dr. Alva 2017.  Getting mask fitting/leak issues worked out as of 07/2016 pulm f/u.  . Osteoarthritis, multiple sites    primarily knees and back.  . Stricture and stenosis of esophagus   . Venous insufficiency of both lower extremities    Past Surgical History:  Procedure Laterality Date  . CARDIOVASCULAR STRESS TEST  2006; 04/27/10; 10/2017   Normal stress nuclear study, normal EF.  2019: EF 40%, inferior ischemia--intermediate risk study-->Dr. Einar Gip to do cath.  . COLONOSCOPY  x 3   Most recent-->12/19/17 (after + cologuard) multiple adenomatous polyps-->recall 3 yrs.  Marland Kitchen LEFT HEART CATH AND CORONARY ANGIOGRAPHY N/A 01/06/2018   One area of 80% ostial stenosis--small and hard to visualize. No intervention.  DAPT for >1 yr recommended.  Procedure: LEFT HEART CATH AND CORONARY  ANGIOGRAPHY;  Surgeon: Adrian Prows, MD;  Location: [facility];  Service: Cardiovascular;  Laterality: N/A;  . NASAL SINUS SURGERY    . Sleep study  07/2016   OSA; CPAP trial at 10 cm H20 recommended by pulm (Dr. Tennis Must Dios--[facility] pulm).  . [place]   ? Discectomy  . TEE WITHOUT CARDIOVERSION  05/2010   Dr. Einar Gip; severe LVH, no valvular abnormalities  . TRANSESOPHAGEAL ECHOCARDIOGRAM  06/06/10; 12/2011; 09/2014; 11/2016   LVOT obst with 135 mm Hg PG. HOCM. No AV  obstruction to flow. No mitral regurg..  Repeat echo 01/09/12 no change.  2016: EF 60%, severe conc LV hypertrophy, normal global wall motion, LA mildly dilated, systolic anterior motion of mitral valve chord, mild MR, mild TR, mild pulm HTN--no signif change since 2013.  Repeat echo 11/2016 and 10/2017 no signif change compared to prior studies.  . TRANSTHORACIC ECHOCARDIOGRAM  05/28/2010; 10/2017   2012; Normal LVEF, LVOT obstruction, lateral wall hypokinesis.  10/2017: EF 55%, hypertrophic CM, grd II dd, normal global wall motion.   No Known Allergies No current facility-administered medications on file prior to encounter.   Current Outpatient Medications on File Prior to Encounter  Medication Sig Dispense Refill  . albuterol (PROAIR HFA) 108 (90 Base) MCG/ACT inhaler INHALE 2 PUFFS INTO THE LUNGS EVERY 4 HOURS AS NEEDED FOR WHEEZING 8.5 g 0  . allopurinol (ZYLOPRIM) 300 MG tablet TAKE 1 TABLET BY MOUTH  TWICE DAILY 180 tablet 1  . aspirin 81 MG tablet Take 81 mg by mouth daily.     . chlorthalidone (HYGROTON) 25 MG tablet TAKE 1 TABLET BY MOUTH EVERY MORNING 90 tablet 2  . Cholecalciferol (VITAMIN D) 2000 UNITS tablet Take 2,000 Units by mouth daily.    . diclofenac Sodium (VOLTAREN) 1 % GEL Apply 2 g topically 4 (four) times daily. 100 g 3  . docusate sodium (COLACE) 100 MG capsule Take 100 mg by mouth daily.    . empagliflozin (JARDIANCE) 25 MG TABS tablet Take 25 mg by mouth daily before breakfast. 30 tablet 11  . fluticasone (FLONASE) 50 MCG/ACT nasal spray USE 2 SPRAYS IN BOTH  NOSTRILS DAILY AS NEEDED  FOR ALLERGIES. 48 g 0  . glipiZIDE (GLUCOTROL XL) 10 MG 24 hr tablet TAKE 1 TABLET BY MOUTH  DAILY 90 tablet 1  . glucose blood (ONE TOUCH ULTRA TEST) test strip Use as instructed to check blood sugar.  DX 250.00 100 each prn  . metFORMIN (GLUCOPHAGE) 1000 MG tablet TAKE 1 TABLET BY MOUTH  TWICE DAILY WITH MEALS 180 tablet 1  . metoprolol tartrate (LOPRESSOR) 50 MG tablet TAKE 1 TABLET BY  MOUTH  TWICE DAILY 180 tablet 3  . mometasone-formoterol (DULERA) 200-5 MCG/ACT AERO Inhale 2 puffs into the lungs 2 (two) times daily. (Patient taking differently: Inhale 2 puffs into the lungs daily as needed for wheezing or shortness of breath. ) 1 Inhaler 0  . ONE TOUCH LANCETS MISC Use as directed to check blood sugar.  DX 250.00 200 each prn  . pantoprazole (PROTONIX) 40 MG tablet TAKE 1 TABLET BY MOUTH  DAILY 30 tablet 5  . rosuvastatin (CRESTOR) 20 MG tablet TAKE 1 TABLET BY MOUTH AT  BEDTIME 90 tablet 1  . traZODone (DESYREL) 50 MG tablet TAKE 1 TABLET(50 MG) BY MOUTH AT BEDTIME 30 tablet 1  . traZODone (DESYREL) 50 MG tablet TAKE 1 TABLET(50 MG) BY MOUTH AT BEDTIME 30 tablet 1  . verapamil (CALAN-SR) 120 MG  CR tablet Take 1 tablet (120 mg total) by mouth every morning. 90 tablet 3   Social History   Socioeconomic History  . Marital status: Married    Spouse name: Not on file  . Number of children: 1  . Years of education: Not on file  . Highest education level: Not on file  Occupational History  . Occupation: retired    Fish farm manager: RETIRED  Tobacco Use  . Smoking status: Former Smoker    Packs/day: 0.50    Years: 40.00    Pack years: 20.00    Types: Cigarettes, Cigars    Quit date: 04/29/1998    Years since quitting: 22.1  . Smokeless tobacco: Never Used  . Tobacco comment: 4 cigars  Vaping Use  . Vaping Use: Never used  Substance and Sexual Activity  . Alcohol use: Yes    Comment: occasionally  . Drug use: No  . Sexual activity: Not on file  Other Topics Concern  . Not on file  Social History Narrative   Married, 1 son, 1 granddaughter.   Retired from [employer] 2000.   Hobbies: trading farm equipment.  Used to be a Environmental manager.   Tobacco x many years, quit @ 2005   No ETOH or drugs.   No regular exercise.Smoking Status:  quit            Social Determinants of Health   Financial Resource Strain: Low Risk   . Difficulty of Paying  Living Expenses: Not hard at all  Food Insecurity: No Food Insecurity  . Worried About Charity fundraiser in the Last Year: Never true  . Ran Out of Food in the Last Year: Never true  Transportation Needs: No Transportation Needs  . Lack of Transportation (Medical): No  . Lack of Transportation (Non-Medical): No  Physical Activity: Sufficiently Active  . Days of Exercise per Week: 7 days  . Minutes of Exercise per Session: 30 min  Stress: No Stress Concern Present  . Feeling of Stress : Not at all  Social Connections: Moderately Integrated  . Frequency of Communication with Friends and Family: More than three times a week  . Frequency of Social Gatherings with Friends and Family: Once a week  . Attends Religious Services: More than 4 times per year  . Active Member of Clubs or Organizations: No  . Attends Archivist Meetings: Never  . Marital Status: Married  Human resources officer Violence: Not At Risk  . Fear of Current or Ex-Partner: No  . Emotionally Abused: No  . Physically Abused: No  . Sexually Abused: No   Family History  Problem Relation Age of Onset  . Diabetes Mother   . Prostate cancer Father        Prostate  . Breast cancer Sister   . Diabetes Maternal Aunt   . Breast cancer Maternal Aunt   . Hyperlipidemia Sister   . Hypertension Neg Hx   . Coronary artery disease Neg Hx   . Colon cancer Neg Hx   . Stomach cancer Neg Hx   . Rectal cancer Neg Hx   . Esophageal cancer Neg Hx      OBJECTIVE:  Vitals:   06/17/20 1105  BP: 111/69  Pulse: 67  Resp: 14  Temp: 97.8 F (36.6 C)  TempSrc: Oral  SpO2: 94%    General appearance: Alert; NAD HEENT: NCAT.  Oropharynx clear.  Lungs: clear to auscultation bilaterally without adventitious breath sounds Heart: regular rate and  rhythm.  Radial pulses 2+ symmetrical bilaterally Abdomen: soft, non-distended; normal active bowel sounds; non-tender to light and deep palpation; nontender at McBurney's point;  negative Murphy's sign; negative rebound; no guarding Back: no CVA tenderness Extremities: no edema; symmetrical with no gross deformities Skin: warm and dry Neurologic: normal gait Psychological: alert and cooperative; normal mood and affect  LABS: No results found for this or any previous visit (from the past 24 hour(s)).  DIAGNOSTIC STUDIES: No results found.   ASSESSMENT & PLAN:  1. Diarrhea, unspecified type    GI pathogen panel ordered with quick screen C diff CBC, CMP, lipase pending Will be in touch with abnormal labs that require further treatment Get rest and drink plenty of fluids Zofran prescribed.  Take as directed.    DIET Instructions:  30 minutes after taking nausea medicine, begin with sips of clear liquids. If able to hold down 2 - 4 ounces for 30 minutes, begin drinking more. Increase your fluid intake to replace losses. Clear liquids only for 24 hours (water, tea, sport drinks, clear flat ginger ale or cola and juices, broth, jello, popsicles, ect). Advance to bland foods, applesauce, rice, baked or boiled chicken, ect. Avoid milk, greasy foods and anything that doesn't agree with you.  If you experience new or worsening symptoms return or go to ER such as fever, chills, nausea, vomiting, diarrhea, bloody or dark tarry stools, constipation, urinary symptoms, worsening abdominal discomfort, symptoms that do not improve with medications, inability to keep fluids down.  Reviewed expectations re: course of current medical issues. Questions answered. Outlined signs and symptoms indicating need for more acute intervention. Patient verbalized understanding. After Visit Summary given.   Faustino Congress, NP 06/17/20 1539

## 2020-06-17 NOTE — ED Triage Notes (Signed)
Diarrhea and vomiting on Monday.  Has been taking immodium and probiotics and Pedialyte. Has slowed the diarrhea some, but still has it. Has taken a covid test this morning and will not have the results until tomorrow.

## 2020-06-18 LAB — CBC WITH DIFFERENTIAL/PLATELET
Basophils Absolute: 0.1 10*3/uL (ref 0.0–0.2)
Basos: 1 %
EOS (ABSOLUTE): 0.2 10*3/uL (ref 0.0–0.4)
Eos: 4 %
Hematocrit: 52.4 % — ABNORMAL HIGH (ref 37.5–51.0)
Hemoglobin: 18.2 g/dL — ABNORMAL HIGH (ref 13.0–17.7)
Immature Grans (Abs): 0 10*3/uL (ref 0.0–0.1)
Immature Granulocytes: 0 %
Lymphocytes Absolute: 2 10*3/uL (ref 0.7–3.1)
Lymphs: 35 %
MCH: 30.2 pg (ref 26.6–33.0)
MCHC: 34.7 g/dL (ref 31.5–35.7)
MCV: 87 fL (ref 79–97)
Monocytes Absolute: 0.7 10*3/uL (ref 0.1–0.9)
Monocytes: 12 %
Neutrophils Absolute: 2.7 10*3/uL (ref 1.4–7.0)
Neutrophils: 48 %
Platelets: 133 10*3/uL — ABNORMAL LOW (ref 150–450)
RBC: 6.03 x10E6/uL — ABNORMAL HIGH (ref 4.14–5.80)
RDW: 14.9 % (ref 11.6–15.4)
WBC: 5.7 10*3/uL (ref 3.4–10.8)

## 2020-06-18 LAB — COMPREHENSIVE METABOLIC PANEL
ALT: 49 IU/L — ABNORMAL HIGH (ref 0–44)
AST: 55 IU/L — ABNORMAL HIGH (ref 0–40)
Albumin/Globulin Ratio: 2.2 (ref 1.2–2.2)
Albumin: 4.3 g/dL (ref 3.7–4.7)
Alkaline Phosphatase: 77 IU/L (ref 44–121)
BUN/Creatinine Ratio: 18 (ref 10–24)
BUN: 17 mg/dL (ref 8–27)
Bilirubin Total: 0.9 mg/dL (ref 0.0–1.2)
CO2: 19 mmol/L — ABNORMAL LOW (ref 20–29)
Calcium: 9.2 mg/dL (ref 8.6–10.2)
Chloride: 101 mmol/L (ref 96–106)
Creatinine, Ser: 0.94 mg/dL (ref 0.76–1.27)
GFR calc Af Amer: 93 mL/min/{1.73_m2} (ref >59)
GFR calc non Af Amer: 80 mL/min/{1.73_m2} (ref >59)
Globulin, Total: 2 g/dL (ref 1.5–4.5)
Glucose: 118 mg/dL — ABNORMAL HIGH (ref 65–99)
Potassium: 3.7 mmol/L (ref 3.5–5.2)
Sodium: 138 mmol/L (ref 134–144)
Total Protein: 6.3 g/dL (ref 6.0–8.5)

## 2020-06-18 LAB — LIPASE: Lipase: 40 U/L (ref 13–78)

## 2020-06-19 ENCOUNTER — Other Ambulatory Visit: Payer: Medicare Other

## 2020-06-27 ENCOUNTER — Other Ambulatory Visit: Payer: Self-pay | Admitting: Cardiology

## 2020-07-03 ENCOUNTER — Other Ambulatory Visit: Payer: Self-pay

## 2020-07-03 ENCOUNTER — Telehealth (INDEPENDENT_AMBULATORY_CARE_PROVIDER_SITE_OTHER): Payer: Medicare Other | Admitting: Family Medicine

## 2020-07-03 ENCOUNTER — Encounter: Payer: Self-pay | Admitting: Family Medicine

## 2020-07-03 VITALS — Temp 100.1°F | Ht 69.0 in | Wt 250.0 lb

## 2020-07-03 DIAGNOSIS — J40 Bronchitis, not specified as acute or chronic: Secondary | ICD-10-CM

## 2020-07-03 DIAGNOSIS — J4541 Moderate persistent asthma with (acute) exacerbation: Secondary | ICD-10-CM | POA: Diagnosis not present

## 2020-07-03 MED ORDER — PROMETHAZINE-DM 6.25-15 MG/5ML PO SYRP: 5.0000 mL | ORAL_SOLUTION | Freq: Four times a day (QID) | ORAL | 0 refills | Status: DC | PRN

## 2020-07-03 MED ORDER — AZITHROMYCIN 250 MG PO TABS: ORAL_TABLET | ORAL | 0 refills | Status: DC

## 2020-07-03 MED ORDER — DULERA 200-5 MCG/ACT IN AERO
2.0000 | INHALATION_SPRAY | Freq: Two times a day (BID) | RESPIRATORY_TRACT | 0 refills | Status: DC
Start: 2020-07-03 — End: 2020-07-04

## 2020-07-03 MED ORDER — ALBUTEROL SULFATE HFA 108 (90 BASE) MCG/ACT IN AERS: INHALATION_SPRAY | RESPIRATORY_TRACT | 0 refills | Status: DC

## 2020-07-03 NOTE — Progress Notes (Signed)
Virtual Visit via Video Note  I connected with Jack [name] on 07/03/20 at  3:20 PM EST by a video enabled telemedicine application and verified that I am speaking with the correct person using two identifiers.  Location/ participants in ov Patient: home with wife  Provider: office    I discussed the limitations of evaluation and management by telemedicine and the availability of in person appointments. The patient expressed understanding and agreed to proceed.  History of Present Illness: Pt is home with his wife c/o fever 101 , sore throat , congestion and cough   + wheezing--- he ran out of his inhalers  Pt is up at night coughing as well and its productive --- yellow mucus  Past Medical History:  Diagnosis Date  . Allergy   . CAD (coronary artery disease)    see PSH section for details  . Carotid bruit 05/28/10   Tortuosity of carotids on doppler u/s, no stenosis.  . Cataract   . Chronic diastolic heart failure (HCC)    Grd II DD  . Colon cancer screening    cologuard POSITIVE 09/2017-->colonoscopy 12/19/17; multiple adenomatous polyps, recall 3 yrs.  Marland Kitchen COPD (chronic obstructive pulmonary disease) ([place])   . Diabetes mellitus    type 2- non insulin-requiring  . DOE (dyspnea on exertion)    Cardiology w/u unrevealing except LVOT obstruction: suspect dyspnea due to HOCM, morbid obesity and OSA--stable as of 02/2017 cardiol f/u.  Marland Kitchen Fatty liver u/s and CT 2009   w/mildly elevated transaminases; u/s 12/2012 reconfirmed dx.  Hep B and C testing negative.  Marland Kitchen GERD (gastroesophageal reflux disease)    EDG 04-03-01, + distal esophageal stricture dilation  . Gout   . Heart murmur   . Hiatal hernia   . History of iron deficiency anemia 02/2019   Hb 12.9 02/2019->home hemoccults ordered but pt never returned these.  GI referral was done but pt did not return their call/unable to be contacted. Fall 2021 iron low but Hb normal->iron supp brought iron levels up (hemoccults neg x 3).  Marland Kitchen Hx of  adenomatous colonic polyps 12/27/2017  . Hyperlipidemia   . Hypertension    severe LVH on echo  . 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 Grd II DD.    . Moderate persistent asthma 04/19/2014  . Morbid obesity ([place])   . OSA on CPAP    Could not tolerate CPAP, not surgical candidate per Dr. Benjamine Mola.  Restarted CPAP (auto titrate 5-15 with full facemask--Dr. Alva 2017.  Getting mask fitting/leak issues worked out as of 07/2016 pulm f/u.  . Osteoarthritis, multiple sites    primarily knees and back.  . Stricture and stenosis of esophagus   . Venous insufficiency of both lower extremities    Outpatient Encounter Medications as of 07/03/2020  Medication Sig  . allopurinol (ZYLOPRIM) 300 MG tablet TAKE 1 TABLET BY MOUTH  TWICE DAILY  . aspirin 81 MG tablet Take 81 mg by mouth daily.  Marland Kitchen azithromycin (ZITHROMAX Z-PAK) 250 MG tablet As directed  . chlorthalidone (HYGROTON) 25 MG tablet TAKE 1 TABLET BY MOUTH EVERY MORNING  . Cholecalciferol (VITAMIN D) 2000 UNITS tablet Take 2,000 Units by mouth daily.  . diclofenac Sodium (VOLTAREN) 1 % GEL Apply 2 g topically 4 (four) times daily.  Marland Kitchen docusate sodium (COLACE) 100 MG capsule Take  100 mg by mouth daily.  . empagliflozin (JARDIANCE) 25 MG TABS tablet Take 25 mg by mouth daily before breakfast.  . fluticasone (FLONASE) 50 MCG/ACT nasal spray USE 2 SPRAYS IN BOTH  NOSTRILS DAILY AS NEEDED  FOR ALLERGIES.  Marland Kitchen glipiZIDE (GLUCOTROL XL) 10 MG 24 hr tablet TAKE 1 TABLET BY MOUTH  DAILY  . glucose blood (ONE TOUCH ULTRA TEST) test strip Use as instructed to check blood sugar.  DX 250.00  . metFORMIN (GLUCOPHAGE) 1000 MG tablet TAKE 1 TABLET BY MOUTH  TWICE DAILY WITH MEALS  . metoprolol tartrate (LOPRESSOR) 50 MG tablet TAKE 1 TABLET BY MOUTH  TWICE DAILY  . ONE TOUCH LANCETS MISC  Use as directed to check blood sugar.  DX 250.00  . pantoprazole (PROTONIX) 40 MG tablet TAKE 1 TABLET BY MOUTH  DAILY  . promethazine-dextromethorphan (PROMETHAZINE-DM) 6.25-15 MG/5ML syrup Take 5 mLs by mouth 4 (four) times daily as needed.  . rosuvastatin (CRESTOR) 20 MG tablet TAKE 1 TABLET BY MOUTH AT  BEDTIME  . traZODone (DESYREL) 50 MG tablet TAKE 1 TABLET(50 MG) BY MOUTH AT BEDTIME  . traZODone (DESYREL) 50 MG tablet TAKE 1 TABLET(50 MG) BY MOUTH AT BEDTIME  . verapamil (CALAN-SR) 120 MG CR tablet Take 1 tablet (120 mg total) by mouth every morning.  . [DISCONTINUED] albuterol (PROAIR HFA) 108 (90 Base) MCG/ACT inhaler INHALE 2 PUFFS INTO THE LUNGS EVERY 4 HOURS AS NEEDED FOR WHEEZING  . [DISCONTINUED] mometasone-formoterol (DULERA) 200-5 MCG/ACT AERO Inhale 2 puffs into the lungs 2 (two) times daily. (Patient taking differently: Inhale 2 puffs into the lungs daily as needed for wheezing or shortness of breath.)  . albuterol (PROAIR HFA) 108 (90 Base) MCG/ACT inhaler INHALE 2 PUFFS INTO THE LUNGS EVERY 4 HOURS AS NEEDED FOR WHEEZING  . mometasone-formoterol (DULERA) 200-5 MCG/ACT AERO Inhale 2 puffs into the lungs 2 (two) times daily.   No facility-administered encounter medications on file as of 07/03/2020.   Observations/Objective: Vitals:   07/03/20 1513  Temp: 100.1 F (37.8 C)  pt is in nad  Assessment and Plan: 1. Moderate persistent asthma with acute exacerbation Refill inhalers Call back if no improvement--- may need pred taper  - mometasone-formoterol (DULERA) 200-5 MCG/ACT AERO; Inhale 2 puffs into the lungs 2 (two) times daily.  Dispense: 1 each; Refill: 0 - albuterol (PROAIR HFA) 108 (90 Base) MCG/ACT inhaler; INHALE 2 PUFFS INTO THE LUNGS EVERY 4 HOURS AS NEEDED FOR WHEEZING  Dispense: 8.5 g; Refill: 0  2. Bronchitis abx and cough med per orders Call back if no improvement  - azithromycin (ZITHROMAX Z-PAK) 250 MG tablet; As directed  Dispense: 6 each; Refill: 0 -  promethazine-dextromethorphan (PROMETHAZINE-DM) 6.25-15 MG/5ML syrup; Take 5 mLs by mouth 4 (four) times daily as needed.  Dispense: 118 mL; Refill: 0  Follow Up Instructions:    I discussed the assessment and treatment plan with the patient. The patient was provided an opportunity to ask questions and all were answered. The patient agreed with the plan and demonstrated an understanding of the instructions.   The patient was advised to call back or seek an in-person evaluation if the symptoms worsen or if the condition fails to improve as anticipated.     Ann Held, DO

## 2020-07-04 ENCOUNTER — Other Ambulatory Visit: Payer: Self-pay

## 2020-07-04 ENCOUNTER — Telehealth: Payer: Self-pay | Admitting: Family Medicine

## 2020-07-04 MED ORDER — FLUTICASONE PROPIONATE HFA 110 MCG/ACT IN AERO: 2.0000 | INHALATION_SPRAY | Freq: Two times a day (BID) | RESPIRATORY_TRACT | 1 refills | Status: DC

## 2020-07-04 NOTE — Telephone Encounter (Signed)
Patient's wife called regarding Dulera. She states it is too expensive and also she thought Dr. Anitra Lauth had already swtiched patient to something less expensive. Please call patient advise.

## 2020-07-04 NOTE — Telephone Encounter (Signed)
Please change to flovent 110 mg 2 puffs bid  #1  1 refills  F/u with pcp next week

## 2020-07-04 NOTE — Telephone Encounter (Signed)
Pt was seen yesterday and prescribed Dulera inhaler. PCP is out of the office until 3/10. Please advise, thanks.

## 2020-07-04 NOTE — Telephone Encounter (Signed)
Spoke with patient's wife, Joann(DPR) and advised change of inhaler from [facility] to [employer]. Recommended he follow up with Dr.McGowen sometime next week , offered to schedule an appt, virtual appt made for 3/17.

## 2020-07-06 ENCOUNTER — Telehealth (INDEPENDENT_AMBULATORY_CARE_PROVIDER_SITE_OTHER): Payer: Medicare Other | Admitting: Family Medicine

## 2020-07-06 ENCOUNTER — Encounter: Payer: Self-pay | Admitting: Family Medicine

## 2020-07-06 VITALS — Temp 98.6°F | Wt 248.0 lb

## 2020-07-06 DIAGNOSIS — J4541 Moderate persistent asthma with (acute) exacerbation: Secondary | ICD-10-CM | POA: Diagnosis not present

## 2020-07-06 DIAGNOSIS — J40 Bronchitis, not specified as acute or chronic: Secondary | ICD-10-CM

## 2020-07-06 MED ORDER — PREDNISONE 20 MG PO TABS: ORAL_TABLET | ORAL | 0 refills | Status: DC

## 2020-07-06 MED ORDER — PROMETHAZINE-DM 6.25-15 MG/5ML PO SYRP: 5.0000 mL | ORAL_SOLUTION | Freq: Four times a day (QID) | ORAL | 0 refills | Status: DC | PRN

## 2020-07-06 NOTE — Telephone Encounter (Signed)
Patient wife, Joann(DPR) called to say husband is not any better.  Patient had mychart visit with Dr. Carollee Herter earlier this week.  Joann reports patient still has cough and wheezing, patient spoke up in background and states he cant get any sleep, he cannot rest for coughing non stop.   Patient requested for PCP to prescribe something stronger. Patient has a virtual appt with Dr. Anitra Lauth on 3/17.  Patient is requesting to be seen sooner than 3/17.  Please advise.  Patient can be reached at [phone]

## 2020-07-06 NOTE — Progress Notes (Signed)
Virtual Visit via Video Note  I connected with Jack [name] on 07/06/20 at  4:00 PM EST by a video enabled telemedicine application and verified that I am speaking with the correct person using two identifiers.  Location patient: home, [place] Location provider:work or home office Persons participating in the virtual visit: patient, provider  I discussed the limitations of evaluation and management by telemedicine and the availability of in person appointments. The patient expressed understanding and agreed to proceed.  Telemedicine visit is a necessity given the COVID-19 restrictions in place at the current time.  HPI: 75 y/o WM with moderate persistent asthma being seen today for "bronchitis". Jack [name] was seen for telemed visit 07/03/20 with Dr. Etter Sjogren.  I reviewed that encounter today. He had been off maintenance inhaler, had classic bronchitis/asthma exac symptoms for 2 wks, was put back on flovent and albuterol + rx'd azithromycin and prometh-DM cough syrup. Prednisone was not rx'd at that time.  UPDATE: Coughing up phlegm in "fits".  Talking or deep breaths tend to trigger coughing fits.  Wheezing some, using albut and it helps it a little while. No fevers in 48h.  No n/v.  Not SOB.  No CP.     ROS: See pertinent positives and negatives per HPI.  Past Medical History:  Diagnosis Date  . Allergy   . CAD (coronary artery disease)    see PSH section for details  . Carotid bruit 05/28/10   Tortuosity of carotids on doppler u/s, no stenosis.  . Cataract   . Chronic diastolic heart failure (HCC)    Grd II DD  . Colon cancer screening    cologuard POSITIVE 09/2017-->colonoscopy 12/19/17; multiple adenomatous polyps, recall 3 yrs.  Marland Kitchen COPD (chronic obstructive pulmonary disease) ([place])   . Diabetes mellitus    type 2- non insulin-requiring  . DOE (dyspnea on exertion)    Cardiology w/u unrevealing except LVOT obstruction: suspect dyspnea due to HOCM, morbid obesity and OSA--stable as of 02/2017 cardiol  f/u.  Marland Kitchen Fatty liver u/s and CT 2009   w/mildly elevated transaminases; u/s 12/2012 reconfirmed dx.  Hep B and C testing negative.  Marland Kitchen GERD (gastroesophageal reflux disease)    EDG 04-03-01, + distal esophageal stricture dilation  . Gout   . Heart murmur   . Hiatal hernia   . History of iron deficiency anemia 02/2019   Hb 12.9 02/2019->home hemoccults ordered but Jack [name] never returned these.  GI referral was done but Jack [name] did not return their call/unable to be contacted. Fall 2021 iron low but Hb normal->iron supp brought iron levels up (hemoccults neg x 3).  Marland Kitchen Hx of adenomatous colonic polyps 12/27/2017  . Hyperlipidemia   . Hypertension    severe LVH on echo  . 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 Grd II DD.    . Moderate persistent asthma 04/19/2014  . Morbid obesity ([place])   . OSA on CPAP    Could not tolerate CPAP, not surgical candidate per Dr. Benjamine Mola.  Restarted CPAP (auto titrate 5-15 with full facemask--Dr. Alva 2017.  Getting mask fitting/leak issues worked out as of 07/2016 pulm f/u.  . Osteoarthritis, multiple sites    primarily knees and back.  . Stricture and stenosis of esophagus   . Venous insufficiency of both lower extremities     Past Surgical History:  Procedure Laterality Date  .  CARDIOVASCULAR STRESS TEST  2006; 04/27/10; 10/2017   Normal stress nuclear study, normal EF.  2019: EF 40%, inferior ischemia--intermediate risk study-->Dr. Einar Gip to do cath.  . COLONOSCOPY  x 3   Most recent-->12/19/17 (after + cologuard) multiple adenomatous polyps-->recall 3 yrs.  Marland Kitchen LEFT HEART CATH AND CORONARY ANGIOGRAPHY N/A 01/06/2018   One area of 80% ostial stenosis--small and hard to visualize. No intervention.  DAPT for >1 yr recommended.  Procedure: LEFT HEART CATH AND CORONARY ANGIOGRAPHY;  Surgeon: Adrian Prows, MD;  Location: [facility];  Service: Cardiovascular;  Laterality: N/A;  . NASAL SINUS SURGERY    . Sleep study  07/2016   OSA; CPAP trial at 10 cm H20 recommended by pulm (Dr. Tennis Must Dios--[place] pulm).  . [place]   ? Discectomy  . TEE WITHOUT CARDIOVERSION  05/2010   Dr. Einar Gip; severe LVH, no valvular abnormalities  . TRANSESOPHAGEAL ECHOCARDIOGRAM  06/06/10; 12/2011; 09/2014; 11/2016   LVOT obst with 135 mm Hg PG. HOCM. No AV obstruction to flow. No mitral regurg..  Repeat echo 01/09/12 no change.  2016: EF 60%, severe conc LV hypertrophy, normal global wall motion, LA mildly dilated, systolic anterior motion of mitral valve chord, mild MR, mild TR, mild pulm HTN--no signif change since 2013.  Repeat echo 11/2016 and 10/2017 no signif change compared to prior studies.  . TRANSTHORACIC ECHOCARDIOGRAM  05/28/2010; 10/2017   2012; Normal LVEF, LVOT obstruction, lateral wall hypokinesis.  10/2017: EF 55%, hypertrophic CM, grd II dd, normal global wall motion.     Current Outpatient Medications:  .  albuterol (PROAIR HFA) 108 (90 Base) MCG/ACT inhaler, INHALE 2 PUFFS INTO THE LUNGS EVERY 4 HOURS AS NEEDED FOR WHEEZING, Disp: 8.5 g, Rfl: 0 .  allopurinol (ZYLOPRIM) 300 MG tablet, TAKE 1 TABLET BY MOUTH  TWICE DAILY, Disp: 180 tablet, Rfl: 1 .  aspirin 81 MG tablet, Take 81 mg by mouth daily., Disp: , Rfl:  .  azithromycin (ZITHROMAX Z-PAK) 250 MG tablet, As directed, Disp: 6 each, Rfl: 0 .  chlorthalidone (HYGROTON) 25 MG tablet, TAKE 1 TABLET BY MOUTH EVERY MORNING, Disp: 90 tablet, Rfl: 2 .  Cholecalciferol (VITAMIN D) 2000 UNITS tablet, Take 2,000 Units by mouth daily., Disp: , Rfl:  .  diclofenac Sodium (VOLTAREN) 1 % GEL, Apply 2 g topically 4 (four) times daily., Disp: 100 g, Rfl: 3 .  docusate sodium (COLACE) 100 MG capsule, Take 100 mg by mouth every other day., Disp: , Rfl:  .  empagliflozin (JARDIANCE) 25 MG TABS tablet, Take 25 mg by mouth daily before breakfast., Disp: 30  tablet, Rfl: 11 .  fluticasone (FLONASE) 50 MCG/ACT nasal spray, USE 2 SPRAYS IN BOTH  NOSTRILS DAILY AS NEEDED  FOR ALLERGIES., Disp: 48 g, Rfl: 0 .  fluticasone (FLOVENT HFA) 110 MCG/ACT inhaler, Inhale 2 puffs into the lungs 2 (two) times daily., Disp: 1 each, Rfl: 1 .  glipiZIDE (GLUCOTROL XL) 10 MG 24 hr tablet, TAKE 1 TABLET BY MOUTH  DAILY, Disp: 90 tablet, Rfl: 1 .  glucose blood (ONE TOUCH ULTRA TEST) test strip, Use as instructed to check blood sugar.  DX 250.00, Disp: 100 each, Rfl: prn .  metFORMIN (GLUCOPHAGE) 1000 MG tablet, TAKE 1 TABLET BY MOUTH  TWICE DAILY WITH MEALS, Disp: 180 tablet, Rfl: 1 .  metoprolol tartrate (LOPRESSOR) 50 MG tablet, TAKE 1 TABLET BY MOUTH  TWICE DAILY, Disp: 180 tablet, Rfl: 3 .  ONE TOUCH LANCETS MISC, Use as directed  to check blood sugar.  DX 250.00, Disp: 200 each, Rfl: prn .  pantoprazole (PROTONIX) 40 MG tablet, TAKE 1 TABLET BY MOUTH  DAILY, Disp: 30 tablet, Rfl: 5 .  promethazine-dextromethorphan (PROMETHAZINE-DM) 6.25-15 MG/5ML syrup, Take 5 mLs by mouth 4 (four) times daily as needed., Disp: 118 mL, Rfl: 0 .  rosuvastatin (CRESTOR) 20 MG tablet, TAKE 1 TABLET BY MOUTH AT  BEDTIME, Disp: 90 tablet, Rfl: 1 .  traZODone (DESYREL) 50 MG tablet, TAKE 1 TABLET(50 MG) BY MOUTH AT BEDTIME, Disp: 30 tablet, Rfl: 1 .  traZODone (DESYREL) 50 MG tablet, TAKE 1 TABLET(50 MG) BY MOUTH AT BEDTIME, Disp: 30 tablet, Rfl: 1 .  verapamil (CALAN-SR) 120 MG CR tablet, Take 1 tablet (120 mg total) by mouth every morning., Disp: 90 tablet, Rfl: 3  EXAM:  VITALS per patient if applicable:  Vitals with BMI 07/06/2020 07/03/2020 06/17/2020  Height - 5\' 9"  -  Weight 248 lbs 250 lbs -  BMI 37.16 96.7 -  Systolic - - 893  Diastolic - - 69  Pulse - - 67    GENERAL: alert, oriented, appears well and in no acute distress  HEENT: atraumatic, conjunttiva clear, no obvious abnormalities on inspection of external nose and ears  NECK: normal movements of the head and  neck  LUNGS: on inspection no signs of respiratory distress, breathing rate appears normal, no obvious gross SOB, gasping or wheezing  CV: no obvious cyanosis  MS: moves all visible extremities without noticeable abnormality  PSYCH/NEURO: pleasant and cooperative, no obvious depression or anxiety, speech and thought processing grossly intact  LABS: none today    Chemistry      Component Value Date/Time   NA 138 06/17/2020 1149   K 3.7 06/17/2020 1149   CL 101 06/17/2020 1149   CO2 19 (L) 06/17/2020 1149   BUN 17 06/17/2020 1149   CREATININE 0.94 06/17/2020 1149      Component Value Date/Time   CALCIUM 9.2 06/17/2020 1149   ALKPHOS 77 06/17/2020 1149   AST 55 (H) 06/17/2020 1149   ALT 49 (H) 06/17/2020 1149   BILITOT 0.9 06/17/2020 1149     Lab Results  Component Value Date   WBC 5.7 06/17/2020   HGB 18.2 (H) 06/17/2020   HCT 52.4 (H) 06/17/2020   MCV 87 06/17/2020   PLT 133 (L) 06/17/2020   ASSESSMENT AND PLAN:  Discussed the following assessment and plan:  Acute exac mod pers asthma. Improved last 48h in regard to fever/malaise. Will add prednisone: 60mg  qd x 2d, then 40mg  qd x 4d, then 20mg  qd x 5d. Cont flovent and albuterol. RF'd prometh/dextrometh cough med today.  I discussed the assessment and treatment plan with the patient. The patient was provided an opportunity to ask questions and all were answered. The patient agreed with the plan and demonstrated an understanding of the instructions.   F/u: if not imp signif in 3-4d  Signed:  Crissie Sickles, MD           07/06/2020

## 2020-07-06 NOTE — Telephone Encounter (Signed)
Pt was contacted to offer sooner appt, verbally discussed with PCP and ok for 4:15 slot today. Appt scheduled.

## 2020-07-13 ENCOUNTER — Telehealth: Payer: Self-pay | Admitting: Family Medicine

## 2020-07-13 ENCOUNTER — Telehealth: Payer: Medicare Other | Admitting: Family Medicine

## 2020-07-13 MED ORDER — PREDNISONE 20 MG PO TABS: ORAL_TABLET | ORAL | 0 refills | Status: DC

## 2020-07-13 NOTE — Telephone Encounter (Signed)
Pt was seen last week on 3/10 for cough and given rx for prednisone taper along with cough syrup. He is starting to feel better but unsure if something else needed. Advised to f/u 3-4 days if no significant improvement.   Please advise if he can take anything otc or rx needed

## 2020-07-13 NOTE — Telephone Encounter (Signed)
I rx'd him a steroid taper that should last him 11 days, so he should still have several days left. Antibiotic refill would not be appropriate. I don't expect his cough to be gone yet-->I just expect it to be significantly improved. Let me know if the pharmacy did not give him approp # of pills for his prednisone taper. Is he also using mucinex dm?

## 2020-07-13 NOTE — Telephone Encounter (Signed)
Spoke with pharmacist to verify the # of pills received, pt was given 15 but based on sig directions should have been given 19. Verified with pt that he has not been using mucinex dm while taking prednisone taper.

## 2020-07-13 NOTE — Telephone Encounter (Signed)
OK, I'll send in more prednisone but also recommend he take otc mucinex dm q12h as needed for cough.

## 2020-07-13 NOTE — Telephone Encounter (Signed)
Spoke with patient and advised of recommendations. Explained several times that nothing else would be needed for cough past the additional 6 day prednisone taper. Reassured he could take Mucinex dm every 12 hours as needed for cough after completion of the prednisone. The cough would gradually resolve on its own.

## 2020-07-13 NOTE — Telephone Encounter (Signed)
Patient's wife states he is feeling better but down to his last prednisone pill and still coughing. They are requesting antibiotic or refill of prednisone.

## 2020-08-07 ENCOUNTER — Encounter: Payer: Self-pay | Admitting: Family Medicine

## 2020-08-07 ENCOUNTER — Ambulatory Visit (INDEPENDENT_AMBULATORY_CARE_PROVIDER_SITE_OTHER): Payer: Medicare Other | Admitting: Family Medicine

## 2020-08-07 ENCOUNTER — Other Ambulatory Visit: Payer: Self-pay

## 2020-08-07 VITALS — BP 114/70 | HR 60 | Temp 97.7°F | Resp 16 | Ht 68.0 in | Wt 251.4 lb

## 2020-08-07 DIAGNOSIS — Z0000 Encounter for general adult medical examination without abnormal findings: Secondary | ICD-10-CM | POA: Diagnosis not present

## 2020-08-07 DIAGNOSIS — I10 Essential (primary) hypertension: Secondary | ICD-10-CM

## 2020-08-07 DIAGNOSIS — D509 Iron deficiency anemia, unspecified: Secondary | ICD-10-CM | POA: Diagnosis not present

## 2020-08-07 DIAGNOSIS — E119 Type 2 diabetes mellitus without complications: Secondary | ICD-10-CM | POA: Diagnosis not present

## 2020-08-07 DIAGNOSIS — E7800 Pure hypercholesterolemia, unspecified: Secondary | ICD-10-CM | POA: Diagnosis not present

## 2020-08-07 LAB — LIPID PANEL
Cholesterol: 106 mg/dL (ref 0–200)
HDL: 40.7 mg/dL (ref >39.00)
LDL Cholesterol: 36 mg/dL (ref 0–99)
NonHDL: 65.44
Total CHOL/HDL Ratio: 3
Triglycerides: 145 mg/dL (ref 0.0–149.0)
VLDL: 29 mg/dL (ref 0.0–40.0)

## 2020-08-07 LAB — COMPREHENSIVE METABOLIC PANEL
ALT: 21 U/L (ref 0–53)
AST: 21 U/L (ref 0–37)
Albumin: 4.2 g/dL (ref 3.5–5.2)
Alkaline Phosphatase: 58 U/L (ref 39–117)
BUN: 17 mg/dL (ref 6–23)
CO2: 28 mEq/L (ref 19–32)
Calcium: 9.9 mg/dL (ref 8.4–10.5)
Chloride: 107 mEq/L (ref 96–112)
Creatinine, Ser: 0.89 mg/dL (ref 0.40–1.50)
GFR: 85.02 mL/min (ref >60.00)
Glucose, Bld: 130 mg/dL — ABNORMAL HIGH (ref 70–99)
Potassium: 4.2 mEq/L (ref 3.5–5.1)
Sodium: 143 mEq/L (ref 135–145)
Total Bilirubin: 0.9 mg/dL (ref 0.2–1.2)
Total Protein: 6.2 g/dL (ref 6.0–8.3)

## 2020-08-07 LAB — CBC WITH DIFFERENTIAL/PLATELET
Basophils Absolute: 0.1 10*3/uL (ref 0.0–0.1)
Basophils Relative: 1.2 % (ref 0.0–3.0)
Eosinophils Absolute: 0.6 10*3/uL (ref 0.0–0.7)
Eosinophils Relative: 7.3 % — ABNORMAL HIGH (ref 0.0–5.0)
HCT: 49.9 % (ref 39.0–52.0)
Hemoglobin: 16.7 g/dL (ref 13.0–17.0)
Lymphocytes Relative: 29.9 % (ref 12.0–46.0)
Lymphs Abs: 2.5 10*3/uL (ref 0.7–4.0)
MCHC: 33.4 g/dL (ref 30.0–36.0)
MCV: 92.7 fl (ref 78.0–100.0)
Monocytes Absolute: 0.7 10*3/uL (ref 0.1–1.0)
Monocytes Relative: 8 % (ref 3.0–12.0)
Neutro Abs: 4.4 10*3/uL (ref 1.4–7.7)
Neutrophils Relative %: 53.6 % (ref 43.0–77.0)
Platelets: 164 10*3/uL (ref 150.0–400.0)
RBC: 5.38 Mil/uL (ref 4.22–5.81)
RDW: 16.3 % — ABNORMAL HIGH (ref 11.5–15.5)
WBC: 8.3 10*3/uL (ref 4.0–10.5)

## 2020-08-07 LAB — HEMOGLOBIN A1C: Hgb A1c MFr Bld: 6.8 % — ABNORMAL HIGH (ref 4.6–6.5)

## 2020-08-07 NOTE — Progress Notes (Signed)
Office Note 08/07/2020  CC: No chief complaint on file.  HPI:  Jack [name] is a 75 y.o. White male who is here accompanied by his wife for annual health maintenance exam and 6 mo f/u DM 2, HTN, HLD, iron def anemia. A/P as of last visit: "1) DM 2, hx of good control. No home gluc monitoring. Continue metformin 1000mg  bid, glipizide xl 10 mg qd, jardiance 25mg  qd. Hba1c, urine microalb/cr today.  2) HTN: excellent bp here today. No home monitoring. Continue lopressor 25mg  bid, cardizem cd 120 qd, and hctz 25mg  qd. BMET today.  3) HLD: tolerating atorva 20mg  qd. FLP and hepatic panel today.  4) IDA 02/2019: hemoccults neg. He never returned calls to GI to arrange appt. No abd pain, melena, or BRBPR. Recheck iron labs, cbc."  INTERIM HX: Is finally doing good/back to normal after a 6 wk period of asthmatic flare up. He's active outside but no formal exercise.  HTN: no home monitoring.  DM: no home monitoring. Feet:  Any burning, tingling, or numbness?-->no.  IDA: Iron levels last visit still low but hemoccults neg x 3 so I started him on iron supp to see how he responded and his ferritin and Hb rose significantly so I stopped his supplemental iron.  HLD:   Past Medical History:  Diagnosis Date  . Allergy   . CAD (coronary artery disease)    see PSH section for details  . Carotid bruit 05/28/10   Tortuosity of carotids on doppler u/s, no stenosis.  . Cataract   . Chronic diastolic heart failure (HCC)    Grd II DD  . Colon cancer screening    cologuard POSITIVE 09/2017-->colonoscopy 12/19/17; multiple adenomatous polyps, recall 3 yrs.  Marland Kitchen COPD (chronic obstructive pulmonary disease) ([place])   . Diabetes mellitus    type 2- non insulin-requiring  . DOE (dyspnea on exertion)    Cardiology w/u unrevealing except LVOT obstruction: suspect dyspnea due to HOCM, morbid obesity and OSA--stable as of 02/2017 cardiol f/u.  Marland Kitchen Fatty liver u/s and CT 2009   w/mildly  elevated transaminases; u/s 12/2012 reconfirmed dx.  Hep B and C testing negative.  Marland Kitchen GERD (gastroesophageal reflux disease)    EDG 04-03-01, + distal esophageal stricture dilation  . Gout   . Heart murmur   . Hiatal hernia   . History of iron deficiency anemia 02/2019   Hb 12.9 02/2019->home hemoccults ordered but pt never returned these.  GI referral was done but pt did not return their call/unable to be contacted. Fall 2021 iron low but Hb normal->iron supp brought iron levels up (hemoccults neg x 3).  Marland Kitchen Hx of adenomatous colonic polyps 12/27/2017  . Hyperlipidemia   . Hypertension    severe LVH on echo  . Hypertrophic obstructive cardiomyopathy (HOCM) ([place]) 2012   LVOT obstruction stable on echo 01/09/12 and again on f/u echo 09/29/14, 11/2016, 10/2017--with evidence of HOCM due to chordal systolic anterior motion of MV leaflet. Grd II DD.    . Moderate persistent asthma 04/19/2014  . Morbid obesity ([place])   . OSA on CPAP    Could not tolerate CPAP, not surgical candidate per Dr. Benjamine Mola.  Restarted CPAP (auto titrate 5-15 with full facemask--Dr. Alva 2017.  Getting mask fitting/leak issues worked out as of 07/2016 pulm f/u.  . Osteoarthritis, multiple sites    primarily knees and back.  . Stricture and stenosis of esophagus   . Venous insufficiency of both lower extremities     Past  Surgical History:  Procedure Laterality Date  . CARDIOVASCULAR STRESS TEST  2006; 04/27/10; 10/2017   Normal stress nuclear study, normal EF.  2019: EF 40%, inferior ischemia--intermediate risk study-->Dr. Einar Gip to do cath.  . COLONOSCOPY  x 3   Most recent-->12/19/17 (after + cologuard) multiple adenomatous polyps-->recall 3 yrs.  Marland Kitchen LEFT HEART CATH AND CORONARY ANGIOGRAPHY N/A 01/06/2018   One area of 80% ostial stenosis--small and hard to visualize. No intervention.  DAPT for >1 yr recommended.  Procedure: LEFT HEART CATH AND CORONARY ANGIOGRAPHY;  Surgeon: Adrian Prows, MD;  Location: [facility];  Service:  Cardiovascular;  Laterality: N/A;  . NASAL SINUS SURGERY    . Sleep study  07/2016   OSA; CPAP trial at 10 cm H20 recommended by pulm (Dr. Tennis Must Dios--[facility]).  . [place]   ? Discectomy  . TEE WITHOUT CARDIOVERSION  05/2010   Dr. Einar Gip; severe LVH, no valvular abnormalities  . TRANSESOPHAGEAL ECHOCARDIOGRAM  06/06/10; 12/2011; 09/2014; 11/2016   LVOT obst with 135 mm Hg PG. HOCM. No AV obstruction to flow. No mitral regurg..  Repeat echo 01/09/12 no change.  2016: EF 60%, severe conc LV hypertrophy, normal global wall motion, LA mildly dilated, systolic anterior motion of mitral valve chord, mild MR, mild TR, mild pulm HTN--no signif change since 2013.  Repeat echo 11/2016 and 10/2017 no signif change compared to prior studies.  . TRANSTHORACIC ECHOCARDIOGRAM  05/28/2010; 10/2017   2012; Normal LVEF, LVOT obstruction, lateral wall hypokinesis.  10/2017: EF 55%, hypertrophic CM, grd II dd, normal global wall motion.    Family History  Problem Relation Age of Onset  . Diabetes Mother   . Prostate cancer Father        Prostate  . Breast cancer Sister   . Diabetes Maternal Aunt   . Breast cancer Maternal Aunt   . Hyperlipidemia Sister   . Hypertension Neg Hx   . Coronary artery disease Neg Hx   . Colon cancer Neg Hx   . Stomach cancer Neg Hx   . Rectal cancer Neg Hx   . Esophageal cancer Neg Hx     Social History   Socioeconomic History  . Marital status: Married    Spouse name: Not on file  . Number of children: 1  . Years of education: Not on file  . Highest education level: Not on file  Occupational History  . Occupation: retired    Fish farm manager: RETIRED  Tobacco Use  . Smoking status: Former Smoker    Packs/day: 0.50    Years: 40.00    Pack years: 20.00    Types: Cigarettes, Cigars    Quit date: 04/29/1998    Years since quitting: 22.2  . Smokeless tobacco: Never Used  . Tobacco comment: 4 cigars  Vaping Use  . Vaping Use: Never used  Substance and Sexual Activity   . Alcohol use: Yes    Comment: occasionally  . Drug use: No  . Sexual activity: Not on file  Other Topics Concern  . Not on file  Social History Narrative   Married, 1 son, 1 granddaughter.   Retired from [employer] 2000.   Hobbies: trading farm equipment.  Used to be a Environmental manager.   Tobacco x many years, quit @ 2005   No ETOH or drugs.   No regular exercise.Smoking Status:  quit            Social Determinants of Health   Financial  Resource Strain: Low Risk   . Difficulty of Paying Living Expenses: Not hard at all  Food Insecurity: No Food Insecurity  . Worried About Charity fundraiser in the Last Year: Never true  . Ran Out of Food in the Last Year: Never true  Transportation Needs: No Transportation Needs  . Lack of Transportation (Medical): No  . Lack of Transportation (Non-Medical): No  Physical Activity: Sufficiently Active  . Days of Exercise per Week: 7 days  . Minutes of Exercise per Session: 30 min  Stress: No Stress Concern Present  . Feeling of Stress : Not at all  Social Connections: Moderately Integrated  . Frequency of Communication with Friends and Family: More than three times a week  . Frequency of Social Gatherings with Friends and Family: Once a week  . Attends Religious Services: More than 4 times per year  . Active Member of Clubs or Organizations: No  . Attends Archivist Meetings: Never  . Marital Status: Married  Human resources officer Violence: Not At Risk  . Fear of Current or Ex-Partner: No  . Emotionally Abused: No  . Physically Abused: No  . Sexually Abused: No    Outpatient Medications Prior to Visit  Medication Sig Dispense Refill  . albuterol (PROAIR HFA) 108 (90 Base) MCG/ACT inhaler INHALE 2 PUFFS INTO THE LUNGS EVERY 4 HOURS AS NEEDED FOR WHEEZING 8.5 g 0  . allopurinol (ZYLOPRIM) 300 MG tablet TAKE 1 TABLET BY MOUTH  TWICE DAILY 180 tablet 1  . aspirin 81 MG tablet Take 81 mg by mouth daily.     Marland Kitchen azithromycin (ZITHROMAX Z-PAK) 250 MG tablet As directed 6 each 0  . chlorthalidone (HYGROTON) 25 MG tablet TAKE 1 TABLET BY MOUTH EVERY MORNING 90 tablet 2  . Cholecalciferol (VITAMIN D) 2000 UNITS tablet Take 2,000 Units by mouth daily.    . diclofenac Sodium (VOLTAREN) 1 % GEL Apply 2 g topically 4 (four) times daily. 100 g 3  . docusate sodium (COLACE) 100 MG capsule Take 100 mg by mouth every other day.    . empagliflozin (JARDIANCE) 25 MG TABS tablet Take 25 mg by mouth daily before breakfast. 30 tablet 11  . fluticasone (FLONASE) 50 MCG/ACT nasal spray USE 2 SPRAYS IN BOTH  NOSTRILS DAILY AS NEEDED  FOR ALLERGIES. 48 g 0  . fluticasone (FLOVENT HFA) 110 MCG/ACT inhaler Inhale 2 puffs into the lungs 2 (two) times daily. 1 each 1  . glipiZIDE (GLUCOTROL XL) 10 MG 24 hr tablet TAKE 1 TABLET BY MOUTH  DAILY 90 tablet 1  . glucose blood (ONE TOUCH ULTRA TEST) test strip Use as instructed to check blood sugar.  DX 250.00 100 each prn  . metFORMIN (GLUCOPHAGE) 1000 MG tablet TAKE 1 TABLET BY MOUTH  TWICE DAILY WITH MEALS 180 tablet 1  . metoprolol tartrate (LOPRESSOR) 50 MG tablet TAKE 1 TABLET BY MOUTH  TWICE DAILY 180 tablet 3  . ONE TOUCH LANCETS MISC Use as directed to check blood sugar.  DX 250.00 200 each prn  . pantoprazole (PROTONIX) 40 MG tablet TAKE 1 TABLET BY MOUTH  DAILY 30 tablet 5  . predniSONE (DELTASONE) 20 MG tablet 2 tabs po qd x 2d, then 1 tab po qd x 4d 8 tablet 0  . promethazine-dextromethorphan (PROMETHAZINE-DM) 6.25-15 MG/5ML syrup Take 5 mLs by mouth 4 (four) times daily as needed. 118 mL 0  . rosuvastatin (CRESTOR) 20 MG tablet TAKE 1 TABLET BY MOUTH AT  BEDTIME 90 tablet  1  . traZODone (DESYREL) 50 MG tablet TAKE 1 TABLET(50 MG) BY MOUTH AT BEDTIME 30 tablet 1  . traZODone (DESYREL) 50 MG tablet TAKE 1 TABLET(50 MG) BY MOUTH AT BEDTIME 30 tablet 1  . verapamil (CALAN-SR) 120 MG CR tablet Take 1 tablet (120 mg total) by mouth every morning. 90 tablet 3   No  facility-administered medications prior to visit.    No Known Allergies  ROS Review of Systems  Constitutional: Negative for appetite change, chills, fatigue and fever.  HENT: Negative for congestion, dental problem, ear pain and sore throat.   Eyes: Negative for discharge, redness and visual disturbance.  Respiratory: Negative for cough, chest tightness, shortness of breath and wheezing.   Cardiovascular: Negative for chest pain, palpitations and leg swelling.  Gastrointestinal: Negative for abdominal pain, blood in stool, diarrhea, nausea and vomiting.  Genitourinary: Negative for difficulty urinating, dysuria, flank pain, frequency, hematuria and urgency.  Musculoskeletal: Negative for arthralgias, back pain, joint swelling, myalgias and neck stiffness.  Skin: Negative for pallor and rash.  Neurological: Negative for dizziness, speech difficulty, weakness and headaches.  Hematological: Negative for adenopathy. Does not bruise/bleed easily.  Psychiatric/Behavioral: Negative for confusion and sleep disturbance. The patient is not nervous/anxious.     PE; Vitals with BMI 07/06/2020 07/03/2020 06/17/2020  Height - 5\' 9"  -  Weight 248 lbs 250 lbs -  BMI 69.67 89.3 -  Systolic - - 810  Diastolic - - 69  Pulse - - 67    Gen: Alert, well appearing.  Patient is oriented to person, place, time, and situation. AFFECT: pleasant, lucid thought and speech. ENT: Ears: EACs clear, normal epithelium.  TMs with good light reflex and landmarks bilaterally.  Eyes: no injection, icteris, swelling, or exudate.  EOMI, PERRLA. Nose: no drainage or turbinate edema/swelling.  No injection or focal lesion.  Mouth: lips without lesion/swelling.  Oral mucosa pink and moist.  Dentition intact and without obvious caries or gingival swelling.  Oropharynx without erythema, exudate, or swelling.  Neck: supple/nontender.  No LAD, mass, or TM.  Carotid pulses 2+ bilaterally, without bruits. CV: RRR, no m/r/g.    LUNGS: CTA bilat, nonlabored resps, good aeration in all lung fields. ABD: soft, NT, ND, BS normal.  No hepatospenomegaly or mass.  No bruits. EXT: no clubbing or cyanosis.  He has 2+ bilat LL pitting edema.  Musculoskeletal: no joint swelling, erythema, warmth, or tenderness.  ROM of all joints intact. Skin - no sores or suspicious lesions or rashes or color changes Foot exam - no swelling, tenderness or skin or vascular lesions. Color and temperature is normal. Sensation is intact. Peripheral pulses are palpable. Toenails are normal.   Pertinent labs:  Lab Results  Component Value Date   TSH 1.89 03/05/2019   Lab Results  Component Value Date   WBC 5.7 06/17/2020   HGB 18.2 (H) 06/17/2020   HCT 52.4 (H) 06/17/2020   MCV 87 06/17/2020   PLT 133 (L) 06/17/2020   Lab Results  Component Value Date   IRON 93 05/26/2020   TIBC 353 05/26/2020   FERRITIN 44 05/26/2020   Lab Results  Component Value Date   CREATININE 0.94 06/17/2020   BUN 17 06/17/2020   NA 138 06/17/2020   K 3.7 06/17/2020   CL 101 06/17/2020   CO2 19 (L) 06/17/2020   Lab Results  Component Value Date   ALT 49 (H) 06/17/2020   AST 55 (H) 06/17/2020   ALKPHOS 77 06/17/2020   BILITOT  0.9 06/17/2020   Lab Results  Component Value Date   CHOL 103 02/15/2020   Lab Results  Component Value Date   HDL 36.90 (L) 02/15/2020   Lab Results  Component Value Date   LDLCALC 44 02/15/2020   Lab Results  Component Value Date   TRIG 109.0 02/15/2020   Lab Results  Component Value Date   CHOLHDL 3 02/15/2020   Lab Results  Component Value Date   PSA 0.81 02/28/2017   PSA 0.70 11/28/2015   PSA 0.71 04/19/2014   Lab Results  Component Value Date   HGBA1C 6.9 (H) 05/26/2020   Lab Results  Component Value Date   LABURIC 5.4 11/20/2016   ASSESSMENT AND PLAN:   1) DM: cont full dose glipizide, jardiance, and metformin. Feet normal today. Check Hba1c and lytes/cr. Urine microalb/cr due next on  01/2021.  2) HTN: good control. Cont chlorthal 25mg  qd, lopressor 50mg  bid, and calan sr 120 mg qd.  3) HLD: cont crestor 20mg  qd. FLP and hepatic panel today.  4) IDA: hx of in 2021.  Hemoccults were neg. Iron and Hb rose approp on oral supplement. Recheck cbc today.  5) Health maintenance exam: Reviewed age and gender appropriate health maintenance issues (prudent diet, regular exercise, health risks of tobacco and excessive alcohol, use of seatbelts, fire alarms in home, use of sunscreen).  Also reviewed age and gender appropriate health screening as well as vaccine recommendations. Vaccines: ALL UTD. Labs: cbc, cmet, flp, a1c. Prostate ca screening: no further prostate screening d/t age. Colon ca screening: recall 11/2020.    An After Visit Summary was printed and given to the patient.  FOLLOW UP:  No follow-ups on file.  Signed:  Crissie Sickles, MD           08/07/2020

## 2020-09-21 DIAGNOSIS — Z7984 Long term (current) use of oral hypoglycemic drugs: Secondary | ICD-10-CM | POA: Diagnosis not present

## 2020-09-21 DIAGNOSIS — H524 Presbyopia: Secondary | ICD-10-CM | POA: Diagnosis not present

## 2020-09-21 DIAGNOSIS — E119 Type 2 diabetes mellitus without complications: Secondary | ICD-10-CM | POA: Diagnosis not present

## 2020-09-21 DIAGNOSIS — H52201 Unspecified astigmatism, right eye: Secondary | ICD-10-CM | POA: Diagnosis not present

## 2020-09-21 DIAGNOSIS — Z961 Presence of intraocular lens: Secondary | ICD-10-CM | POA: Diagnosis not present

## 2020-09-21 LAB — HM DIABETES EYE EXAM

## 2020-10-31 DIAGNOSIS — M5136 Other intervertebral disc degeneration, lumbar region: Secondary | ICD-10-CM | POA: Diagnosis not present

## 2020-10-31 DIAGNOSIS — M9903 Segmental and somatic dysfunction of lumbar region: Secondary | ICD-10-CM | POA: Diagnosis not present

## 2020-11-10 ENCOUNTER — Encounter: Payer: Self-pay | Admitting: Family Medicine

## 2020-11-10 ENCOUNTER — Ambulatory Visit (INDEPENDENT_AMBULATORY_CARE_PROVIDER_SITE_OTHER): Payer: Medicare Other | Admitting: Family Medicine

## 2020-11-10 ENCOUNTER — Other Ambulatory Visit: Payer: Self-pay

## 2020-11-10 VITALS — BP 140/77 | HR 62 | Temp 97.8°F | Resp 16 | Ht 68.0 in | Wt 256.6 lb

## 2020-11-10 DIAGNOSIS — I872 Venous insufficiency (chronic) (peripheral): Secondary | ICD-10-CM

## 2020-11-10 DIAGNOSIS — I10 Essential (primary) hypertension: Secondary | ICD-10-CM

## 2020-11-10 DIAGNOSIS — E7800 Pure hypercholesterolemia, unspecified: Secondary | ICD-10-CM

## 2020-11-10 DIAGNOSIS — E119 Type 2 diabetes mellitus without complications: Secondary | ICD-10-CM | POA: Diagnosis not present

## 2020-11-10 LAB — POCT GLYCOSYLATED HEMOGLOBIN (HGB A1C)
HbA1c POC (<> result, manual entry): 6.5 % (ref 4.0–5.6)
HbA1c, POC (controlled diabetic range): 6.5 % (ref 0.0–7.0)
HbA1c, POC (prediabetic range): 6.5 % — AB (ref 5.7–6.4)
Hemoglobin A1C: 6.5 % — AB (ref 4.0–5.6)

## 2020-11-10 MED ORDER — ALLOPURINOL 300 MG PO TABS: ORAL_TABLET | ORAL | 3 refills | Status: DC

## 2020-11-10 MED ORDER — PANTOPRAZOLE SODIUM 40 MG PO TBEC
40.0000 mg | DELAYED_RELEASE_TABLET | Freq: Every day | ORAL | 3 refills | Status: DC
Start: 2020-11-10 — End: 2021-09-25

## 2020-11-10 MED ORDER — ROSUVASTATIN CALCIUM 20 MG PO TABS: 20.0000 mg | ORAL_TABLET | Freq: Every day | ORAL | 3 refills | Status: DC

## 2020-11-10 MED ORDER — GLIPIZIDE ER 10 MG PO TB24: 10.0000 mg | ORAL_TABLET | Freq: Every day | ORAL | 3 refills | Status: DC

## 2020-11-10 NOTE — Progress Notes (Signed)
OFFICE VISIT  11/10/2020  CC:  Chief Complaint  Patient presents with   Follow-up    RCI, pt is fasting   HPI:    Patient is a 75 y.o. Caucasian male who presents for 3 mo f/u DM 2, HTN, HLD, and hx iron def anemia. A/P as of last visit: "1) DM: cont full dose glipizide, jardiance, and metformin. Feet normal today. Check Hba1c and lytes/cr. Urine microalb/cr due next on 01/2021.   2) HTN: good control. Cont chlorthal 25mg  qd, lopressor 50mg  bid, and calan sr 120 mg qd.   3) HLD: cont crestor 20mg  qd. FLP and hepatic panel today.   4) IDA: hx of in 2021.  Hemoccults were neg. Iron and Hb rose approp on oral supplement. Recheck cbc today.   5) Health maintenance exam: Reviewed age and gender appropriate health maintenance issues (prudent diet, regular exercise, health risks of tobacco and excessive alcohol, use of seatbelts, fire alarms in home, use of sunscreen).  Also reviewed age and gender appropriate health screening as well as vaccine recommendations. Vaccines: ALL UTD. Labs: cbc, cmet, flp, a1c. Prostate ca screening: no further prostate screening d/t age. Colon ca screening: recall 11/2020."  INTERIM HX: Feeling quite well, enjoying taking his corvette to weekly car shows. Fairly active but no formal exercise.  Diet is fairly good but high salt.  No home glucose or bp measurements. Compliant with meds.  Says ankle and top of foot swells up sometimes, about once a month and usually after he's been up and active through a long day.  No pain or redness.  No gout flares in a long time.   ROS as above, plus--> no fevers, no CP, no SOB, no wheezing, no cough, no dizziness, no HAs, no rashes, no melena/hematochezia.  No polyuria or polydipsia.  No myalgias or arthralgias.  No focal weakness, paresthesias, or tremors.  No acute vision or hearing abnormalities.  No dysuria or unusual/new urinary urgency or frequency.  No n/v/d or abd pain.  No palpitations.    Past  Medical History:  Diagnosis Date   Allergy    CAD (coronary artery disease)    see PSH section for details   Carotid bruit 05/28/10   Tortuosity of carotids on doppler u/s, no stenosis.   Cataract    Chronic diastolic heart failure (HCC)    Grd II DD   Colon cancer screening    cologuard POSITIVE 09/2017-->colonoscopy 12/19/17; multiple adenomatous polyps, recall 3 yrs.   COPD (chronic obstructive pulmonary disease) (HCC)    Diabetes mellitus    type 2- non insulin-requiring   DOE (dyspnea on exertion)    Cardiology w/u unrevealing except LVOT obstruction: suspect dyspnea due to HOCM, morbid obesity and OSA--stable as of 02/2017 cardiol f/u.   Fatty liver u/s and CT 2009   w/mildly elevated transaminases; u/s 12/2012 reconfirmed dx.  Hep B and C testing negative.   GERD (gastroesophageal reflux disease)    EDG 04-03-01, + distal esophageal stricture dilation   Gout    Heart murmur    Hiatal hernia    History of iron deficiency anemia 02/2019   Hb 12.9 02/2019->home hemoccults ordered but pt never returned these.  GI referral was done but pt did not return their call/unable to be contacted. Fall 2021 iron low but Hb normal->iron supp brought iron levels up (hemoccults neg x 3).   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 Grd II DD.     Moderate persistent asthma 04/19/2014   Morbid obesity (HCC)    OSA on CPAP    Could not tolerate CPAP, not surgical candidate per Dr. Benjamine Mola.  Restarted CPAP (auto titrate 5-15 with full facemask--Dr. Alva 2017.  Getting mask fitting/leak issues worked out as of 07/2016 pulm f/u.   Osteoarthritis, multiple sites    primarily knees and back.   Stricture and stenosis of esophagus     Venous insufficiency of both lower extremities     Past Surgical History:  Procedure Laterality Date   CARDIOVASCULAR STRESS TEST  2006; 04/27/10; 10/2017   Normal stress nuclear study, normal EF.  2019: EF 40%, inferior ischemia--intermediate risk study-->Dr. Einar Gip to do cath.   COLONOSCOPY  x 3   Most recent-->12/19/17 (after + cologuard) multiple adenomatous polyps-->recall 3 yrs.   LEFT HEART CATH AND CORONARY ANGIOGRAPHY N/A 01/06/2018   One area of 80% ostial stenosis--small and hard to visualize. No intervention.  DAPT for >1 yr recommended.  Procedure: LEFT HEART CATH AND CORONARY ANGIOGRAPHY;  Surgeon: Adrian Prows, MD;  Location: [facility];  Service: Cardiovascular;  Laterality: N/A;   NASAL SINUS SURGERY     Sleep study  07/2016   OSA; CPAP trial at 10 cm H20 recommended by pulm (Dr. Tennis Must Dios[facility]).   [facility]   ? Discectomy   TEE WITHOUT CARDIOVERSION  05/2010   Dr. Einar Gip; severe LVH, no valvular abnormalities   TRANSESOPHAGEAL ECHOCARDIOGRAM  06/06/10; 12/2011; 09/2014; 11/2016   LVOT obst with 135 mm Hg PG. HOCM. No AV obstruction to flow. No mitral regurg..  Repeat echo 01/09/12 no change.  2016: EF 60%, severe conc LV hypertrophy, normal global wall motion, LA mildly dilated, systolic anterior motion of mitral valve chord, mild MR, mild TR, mild pulm HTN--no signif change since 2013.  Repeat echo 11/2016 and 10/2017 no signif change compared to prior studies.   TRANSTHORACIC ECHOCARDIOGRAM  05/28/2010; 10/2017   2012; Normal LVEF, LVOT obstruction, lateral wall hypokinesis.  10/2017: EF 55%, hypertrophic CM, grd II dd, normal global wall motion.    Outpatient Medications Prior to Visit  Medication Sig Dispense Refill   albuterol (PROAIR HFA) 108 (90 Base) MCG/ACT inhaler INHALE 2 PUFFS INTO THE LUNGS EVERY 4 HOURS AS NEEDED FOR WHEEZING 8.5 g 0   aspirin 81 MG tablet Take 81 mg by mouth daily.     chlorthalidone (HYGROTON) 25 MG tablet TAKE 1 TABLET BY MOUTH  EVERY MORNING 90 tablet 2   Cholecalciferol (VITAMIN D) 2000 UNITS tablet Take 2,000 Units by mouth daily.     diclofenac Sodium (VOLTAREN) 1 % GEL Apply 2 g topically 4 (four) times daily. 100 g 3   docusate sodium (COLACE) 100 MG capsule Take 100 mg by mouth every other day.     empagliflozin (JARDIANCE) 25 MG TABS tablet Take 25 mg by mouth daily before breakfast. 30 tablet 11   fluticasone (FLONASE) 50 MCG/ACT nasal spray USE 2 SPRAYS IN BOTH  NOSTRILS DAILY AS NEEDED  FOR ALLERGIES. 48 g 0   fluticasone (FLOVENT HFA) 110 MCG/ACT inhaler Inhale 2 puffs into the lungs 2 (two) times daily. 1 each 1   glucose blood (ONE TOUCH ULTRA TEST) test strip Use  as instructed to check blood sugar.  DX 250.00 100 each prn   metFORMIN (GLUCOPHAGE) 1000 MG tablet TAKE 1 TABLET BY MOUTH  TWICE DAILY WITH MEALS 180 tablet 1   metoprolol tartrate (LOPRESSOR) 50 MG tablet TAKE 1 TABLET BY MOUTH  TWICE DAILY 180 tablet 3   ONE TOUCH LANCETS MISC Use as directed to check blood sugar.  DX 250.00 200 each prn   traZODone (DESYREL) 50 MG tablet TAKE 1 TABLET(50 MG) BY MOUTH AT BEDTIME 30 tablet 1   traZODone (DESYREL) 50 MG tablet TAKE 1 TABLET(50 MG) BY MOUTH AT BEDTIME 30 tablet 1   verapamil (CALAN-SR) 120 MG CR tablet Take 1 tablet (120 mg total) by mouth every morning. 90 tablet 3   allopurinol (ZYLOPRIM) 300 MG tablet TAKE 1 TABLET BY MOUTH  TWICE DAILY 180 tablet 1   glipiZIDE (GLUCOTROL XL) 10 MG 24 hr tablet TAKE 1 TABLET BY MOUTH  DAILY 90 tablet 1   pantoprazole (PROTONIX) 40 MG tablet TAKE 1 TABLET BY MOUTH  DAILY 30 tablet 5   rosuvastatin (CRESTOR) 20 MG tablet TAKE 1 TABLET BY MOUTH AT  BEDTIME 90 tablet 1   No facility-administered medications prior to visit.    No Known Allergies  ROS As per HPI  PE: Vitals with BMI 11/10/2020 08/07/2020 07/06/2020  Height 5\' 8"  5\' 8"  -  Weight 256 lbs 10 oz 251 lbs 6 oz 248 lbs  BMI 39.02 87.86 76.72  Systolic 094 709 -  Diastolic 77 70 -  Pulse 62 60 -    Gen: Alert, well appearing.  Patient is oriented to person, place, time, and situation. AFFECT: pleasant, lucid thought and speech. LE's: freckling hyperpigmentation bilat pretibial surfaces w/out tenderness or erythema. Trace to 1+ pitting edema L pretibial region, 2-3+ pitting R pretibial and ankle regions. No signif fibrotic skin changes.   LABS:  Lab Results  Component Value Date   TSH 1.89 03/05/2019   Lab Results  Component Value Date   WBC 8.3 08/07/2020   HGB 16.7 08/07/2020   HCT 49.9 08/07/2020   MCV 92.7 08/07/2020   PLT 164.0 08/07/2020   Lab Results  Component Value Date   CREATININE 0.89 08/07/2020   BUN 17 08/07/2020   NA 143 08/07/2020   K 4.2 08/07/2020   CL 107 08/07/2020   CO2 28 08/07/2020   Lab Results  Component Value Date   ALT 21 08/07/2020   AST 21 08/07/2020   ALKPHOS 58 08/07/2020   BILITOT 0.9 08/07/2020   Lab Results  Component Value Date   CHOL 106 08/07/2020   Lab Results  Component Value Date   HDL 40.70 08/07/2020   Lab Results  Component Value Date   LDLCALC 36 08/07/2020   Lab Results  Component Value Date   TRIG 145.0 08/07/2020   Lab Results  Component Value Date   CHOLHDL 3 08/07/2020   Lab Results  Component Value Date   PSA 0.81 02/28/2017   PSA 0.70 11/28/2015   PSA 0.71 04/19/2014   Lab Results  Component Value Date   HGBA1C 6.5 (A) 11/10/2020   HGBA1C 6.5 11/10/2020   HGBA1C 6.5 (A) 11/10/2020   HGBA1C 6.5 11/10/2020   POC Hba1c today is 6.5%.  IMPRESSION AND PLAN:  1) DM 2: great control, a1c 6.5% today. Con glipizide xl 10 qd, metformin 1000 bid, and jardiance 25 qd.  2) HTN: systolic mildly elev today. No home monitoring. His salt intake is quite high. Discussed  lowering Na intake, no change in meds at this time. Lytes/cr have been stable long term, so I'll hold on off rechecking these today->plan recheck at f/u in 3 mo.  3) HLD: tolerating crestor 20mg  qd. LDL was 36 a few months  ago. No changes.  Rpt lipids 3 mo.  4) Bilat LE edema d/t chronic venous insufficiency, R>>L. Discussed today, pt admits he loves salt but he'll try to cut back. Elevation of legs prn discussed.  5) IDA: hx of in 2021.  Hemoccults were neg. Iron and Hb rose approp on oral supplement->he's off this now. Will monitor cbc again in 3 mo.  An After Visit Summary was printed and given to the patient.  FOLLOW UP: Return in about 3 months (around 02/10/2021) for annual CPE (fasting) + RCI.  Signed:  Crissie Sickles, MD           11/10/2020

## 2020-11-15 ENCOUNTER — Other Ambulatory Visit: Payer: Self-pay | Admitting: Cardiology

## 2020-11-15 DIAGNOSIS — F5101 Primary insomnia: Secondary | ICD-10-CM

## 2020-11-18 ENCOUNTER — Other Ambulatory Visit: Payer: Self-pay | Admitting: Family Medicine

## 2020-12-18 DIAGNOSIS — M1711 Unilateral primary osteoarthritis, right knee: Secondary | ICD-10-CM | POA: Diagnosis not present

## 2020-12-29 ENCOUNTER — Telehealth: Payer: Self-pay

## 2020-12-29 NOTE — Telephone Encounter (Signed)
I signed but then realized the form has '10mg'$  Jardiance on it.  His is on '25mg'$  jardiance. ? Need new form?

## 2020-12-29 NOTE — Telephone Encounter (Signed)
Received patient assistance application for patient, placed on PCP desk to review and sign, if appropriate.

## 2020-12-29 NOTE — Telephone Encounter (Signed)
They are faxing a new form for completion, will give to PCP once received.

## 2021-01-02 NOTE — Telephone Encounter (Signed)
Received faxed form for '25mg'$  Jardiance. Placed in Dr. Idelle Leech front office inbox to be signed and returned.

## 2021-01-03 NOTE — Telephone Encounter (Signed)
Signed and put in box to go up front.  

## 2021-01-03 NOTE — Telephone Encounter (Signed)
Placed on PCP desk for signature. 

## 2021-01-04 NOTE — Telephone Encounter (Signed)
Faxed signed form to [facility]. Fax confirmation received.

## 2021-01-24 ENCOUNTER — Ambulatory Visit: Payer: Medicare Other | Admitting: Dermatology

## 2021-01-24 ENCOUNTER — Other Ambulatory Visit: Payer: Self-pay

## 2021-01-24 DIAGNOSIS — C4401 Basal cell carcinoma of skin of lip: Secondary | ICD-10-CM

## 2021-01-24 DIAGNOSIS — Z1283 Encounter for screening for malignant neoplasm of skin: Secondary | ICD-10-CM

## 2021-01-24 DIAGNOSIS — Z85828 Personal history of other malignant neoplasm of skin: Secondary | ICD-10-CM

## 2021-01-24 DIAGNOSIS — D485 Neoplasm of uncertain behavior of skin: Secondary | ICD-10-CM

## 2021-01-24 DIAGNOSIS — D1801 Hemangioma of skin and subcutaneous tissue: Secondary | ICD-10-CM | POA: Diagnosis not present

## 2021-01-24 NOTE — Patient Instructions (Signed)

## 2021-01-29 ENCOUNTER — Telehealth: Payer: Self-pay | Admitting: *Deleted

## 2021-01-29 NOTE — Telephone Encounter (Signed)
-----   Message from Lavonna Monarch, MD sent at 01/28/2021  6:40 AM EDT ----- Schedule Mohs

## 2021-01-29 NOTE — Telephone Encounter (Signed)
Path to patient. Referral info faxed to [facility].

## 2021-01-30 ENCOUNTER — Ambulatory Visit: Payer: Medicare Other | Admitting: Dermatology

## 2021-01-31 DIAGNOSIS — M1711 Unilateral primary osteoarthritis, right knee: Secondary | ICD-10-CM | POA: Diagnosis not present

## 2021-02-07 ENCOUNTER — Other Ambulatory Visit: Payer: Self-pay | Admitting: Family Medicine

## 2021-02-07 DIAGNOSIS — M1711 Unilateral primary osteoarthritis, right knee: Secondary | ICD-10-CM | POA: Diagnosis not present

## 2021-02-08 NOTE — Telephone Encounter (Signed)
Pt has been scheduled for f/u, will have wife call back to let us know if he has enough until appt on 10/21

## 2021-02-10 ENCOUNTER — Encounter: Payer: Self-pay | Admitting: Dermatology

## 2021-02-10 NOTE — Progress Notes (Signed)
   Follow-Up Visit   Subjective  Jack [name] is a 75 y.o. male who presents for the following: Skin Problem (Has a lesion on left thigh. Removed x 1 year ago. But came back darker. Marcelina Morel has lesion on bottom lip x 1 month does bleed. History of non mole skin cancers. ).  General skin check, recurrence of the dark spot on left leg and new spot on lower lip. Location:  Duration:  Quality:  Associated Signs/Symptoms: Modifying Factors:  Severity:  Timing: Context:   Objective  Well appearing patient in no apparent distress; mood and affect are within normal limits. Legs plus waist up skin examination.  Typical basal cell lower lip and atypical mole black spot leg will be biopsied.  No other suspicious lesions.  Right Thigh - Anterior Black 5 mm crust       Mid Lower [place] Lip Pearly telangiectatic 5 mm papule         A full examination was performed including scalp, head, eyes, ears, nose, lips, neck, chest, axillae, abdomen, back, buttocks, bilateral upper extremities, bilateral lower extremities, hands, feet, fingers, toes, fingernails, and toenails. All findings within normal limits unless otherwise noted below.  Areas beneath undergarments not examined.   Assessment & Plan    Neoplasm of uncertain behavior of skin (2) Right Thigh - Anterior  Skin / nail biopsy Type of biopsy: tangential   Informed consent: discussed and consent obtained   Timeout: patient name, date of birth, surgical site, and procedure verified   Anesthesia: the lesion was anesthetized in a standard fashion   Anesthetic:  1% lidocaine w/ epinephrine 1-100,000 local infiltration Instrument used: flexible razor blade   Hemostasis achieved with: ferric subsulfate   Outcome: patient tolerated procedure well   Post-procedure details: wound care instructions given    Specimen 1 - Surgical pathology Differential Diagnosis: hemangioma/atypia   Check Margins: No  Mid Lower Vermilion  Lip  Skin / nail biopsy Type of biopsy: tangential   Informed consent: discussed and consent obtained   Timeout: patient name, date of birth, surgical site, and procedure verified   Anesthesia: the lesion was anesthetized in a standard fashion   Anesthetic:  1% lidocaine w/ epinephrine 1-100,000 local infiltration Instrument used: flexible razor blade   Hemostasis achieved with: ferric subsulfate   Outcome: patient tolerated procedure well   Post-procedure details: wound care instructions given    Specimen 2 - Surgical pathology Differential Diagnosis: scc vs bcc  Check Margins: No  Encounter for screening for malignant neoplasm of skin  Annual skin examination.      I, Lavonna Monarch, MD, have reviewed all documentation for this visit.  The documentation on 02/10/21 for the exam, diagnosis, procedures, and orders are all accurate and complete.

## 2021-02-14 DIAGNOSIS — M1711 Unilateral primary osteoarthritis, right knee: Secondary | ICD-10-CM | POA: Diagnosis not present

## 2021-02-16 ENCOUNTER — Ambulatory Visit (INDEPENDENT_AMBULATORY_CARE_PROVIDER_SITE_OTHER): Payer: Medicare Other | Admitting: Family Medicine

## 2021-02-16 ENCOUNTER — Encounter: Payer: Self-pay | Admitting: Family Medicine

## 2021-02-16 ENCOUNTER — Other Ambulatory Visit: Payer: Self-pay

## 2021-02-16 VITALS — BP 143/74 | HR 61 | Temp 97.8°F | Ht 68.0 in | Wt 260.0 lb

## 2021-02-16 DIAGNOSIS — I10 Essential (primary) hypertension: Secondary | ICD-10-CM

## 2021-02-16 DIAGNOSIS — Z862 Personal history of diseases of the blood and blood-forming organs and certain disorders involving the immune mechanism: Secondary | ICD-10-CM | POA: Diagnosis not present

## 2021-02-16 DIAGNOSIS — E119 Type 2 diabetes mellitus without complications: Secondary | ICD-10-CM

## 2021-02-16 DIAGNOSIS — E7800 Pure hypercholesterolemia, unspecified: Secondary | ICD-10-CM | POA: Diagnosis not present

## 2021-02-16 DIAGNOSIS — Z0000 Encounter for general adult medical examination without abnormal findings: Secondary | ICD-10-CM

## 2021-02-16 DIAGNOSIS — Z125 Encounter for screening for malignant neoplasm of prostate: Secondary | ICD-10-CM | POA: Diagnosis not present

## 2021-02-16 LAB — CBC WITH DIFFERENTIAL/PLATELET
Basophils Absolute: 0.1 10*3/uL (ref 0.0–0.1)
Basophils Relative: 0.8 % (ref 0.0–3.0)
Eosinophils Absolute: 0.6 10*3/uL (ref 0.0–0.7)
Eosinophils Relative: 7.1 % — ABNORMAL HIGH (ref 0.0–5.0)
HCT: 50.1 % (ref 39.0–52.0)
Hemoglobin: 16.7 g/dL (ref 13.0–17.0)
Lymphocytes Relative: 27.4 % (ref 12.0–46.0)
Lymphs Abs: 2.1 10*3/uL (ref 0.7–4.0)
MCHC: 33.4 g/dL (ref 30.0–36.0)
MCV: 92.3 fl (ref 78.0–100.0)
Monocytes Absolute: 0.6 10*3/uL (ref 0.1–1.0)
Monocytes Relative: 8.3 % (ref 3.0–12.0)
Neutro Abs: 4.4 10*3/uL (ref 1.4–7.7)
Neutrophils Relative %: 56.4 % (ref 43.0–77.0)
Platelets: 136 10*3/uL — ABNORMAL LOW (ref 150.0–400.0)
RBC: 5.43 Mil/uL (ref 4.22–5.81)
RDW: 15.9 % — ABNORMAL HIGH (ref 11.5–15.5)
WBC: 7.8 10*3/uL (ref 4.0–10.5)

## 2021-02-16 LAB — COMPREHENSIVE METABOLIC PANEL
ALT: 23 U/L (ref 0–53)
AST: 31 U/L (ref 0–37)
Albumin: 4.6 g/dL (ref 3.5–5.2)
Alkaline Phosphatase: 58 U/L (ref 39–117)
BUN: 15 mg/dL (ref 6–23)
CO2: 26 mEq/L (ref 19–32)
Calcium: 9.8 mg/dL (ref 8.4–10.5)
Chloride: 108 mEq/L (ref 96–112)
Creatinine, Ser: 0.92 mg/dL (ref 0.40–1.50)
GFR: 82.23 mL/min (ref >60.00)
Glucose, Bld: 104 mg/dL — ABNORMAL HIGH (ref 70–99)
Potassium: 4.4 mEq/L (ref 3.5–5.1)
Sodium: 142 mEq/L (ref 135–145)
Total Bilirubin: 1.3 mg/dL — ABNORMAL HIGH (ref 0.2–1.2)
Total Protein: 6.7 g/dL (ref 6.0–8.3)

## 2021-02-16 LAB — MICROALBUMIN / CREATININE URINE RATIO
Creatinine,U: 119 mg/dL
Microalb Creat Ratio: 3 mg/g (ref 0.0–30.0)
Microalb, Ur: 3.6 mg/dL — ABNORMAL HIGH (ref 0.0–1.9)

## 2021-02-16 LAB — LIPID PANEL
Cholesterol: 102 mg/dL (ref 0–200)
HDL: 37.6 mg/dL — ABNORMAL LOW (ref >39.00)
LDL Cholesterol: 41 mg/dL (ref 0–99)
NonHDL: 64.15
Total CHOL/HDL Ratio: 3
Triglycerides: 118 mg/dL (ref 0.0–149.0)
VLDL: 23.6 mg/dL (ref 0.0–40.0)

## 2021-02-16 LAB — HEMOGLOBIN A1C: Hgb A1c MFr Bld: 6.2 % (ref 4.6–6.5)

## 2021-02-16 LAB — TSH: TSH: 2.34 u[IU]/mL (ref 0.35–5.50)

## 2021-02-16 MED ORDER — IRBESARTAN 75 MG PO TABS: 75.0000 mg | ORAL_TABLET | Freq: Every day | ORAL | 0 refills | Status: DC

## 2021-02-16 NOTE — Progress Notes (Signed)
Office Note 02/16/2021  CC:  Chief Complaint  Patient presents with   Follow-up    RCI, pt is fasting. Normally goes to Dr.Shapiro's for eye exams    HPI:  Patient is a 75 y.o. male who is here for annual health maintenance exam and f/u DM, HTN, and HLD. A/P as of last visit: "1) DM 2: great control, a1c 6.5% today. Con glipizide xl 10 qd, metformin 1000 bid, and jardiance 25 qd.   2) HTN: systolic mildly elev today. No home monitoring. His salt intake is quite high. Discussed lowering Na intake, no change in meds at this time. Lytes/cr have been stable long term, so I'll hold on off rechecking these today->plan recheck at f/u in 3 mo.   3) HLD: tolerating crestor 20mg  qd. LDL was 36 a few months ago. No changes.  Rpt lipids 3 mo.   4) Bilat LE edema d/t chronic venous insufficiency, R>>L. Discussed today, pt admits he loves salt but he'll try to cut back. Elevation of legs prn discussed.   5) IDA: hx of in 2021.  Hemoccults were neg. Iron and Hb rose approp on oral supplement->he's off this now. Will monitor cbc again in 3 mo."  INTERIM HX: Feeling well. No home blood pressure or glucose monitoring. He is a little more stressed today due to getting ready for a busy weekend and he did not get much sleep last night.   Past Medical History:  Diagnosis Date   Allergy    CAD (coronary artery disease)    see PSH section for details   Carotid bruit 05/28/10   Tortuosity of carotids on doppler u/s, no stenosis.   Cataract    Chronic diastolic heart failure (HCC)    Grd II DD   Colon cancer screening    cologuard POSITIVE 09/2017-->colonoscopy 12/19/17; multiple adenomatous polyps, recall 3 yrs.   COPD (chronic obstructive pulmonary disease) (HCC)    Diabetes mellitus    type 2- non insulin-requiring   DOE (dyspnea on exertion)    Cardiology w/u unrevealing except LVOT obstruction: suspect dyspnea due to HOCM, morbid obesity and OSA--stable as of 02/2017 cardiol f/u.    Fatty liver u/s and CT 2009   w/mildly elevated transaminases; u/s 12/2012 reconfirmed dx.  Hep B and C testing negative.   GERD (gastroesophageal reflux disease)    EDG 04-03-01, + distal esophageal stricture dilation   Gout    Heart murmur    Hiatal hernia    History of iron deficiency anemia 02/2019   Hb 12.9 02/2019->home hemoccults ordered but pt never returned these.  GI referral was done but pt did not return their call/unable to be contacted. Fall 2021 iron low but Hb normal->iron supp brought iron levels up (hemoccults neg x 3).   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 Grd II DD.     Moderate persistent asthma 04/19/2014   Morbid obesity (HCC)    OSA on CPAP    Could not tolerate CPAP, not surgical candidate per Dr. Benjamine Mola.  Restarted CPAP (auto titrate 5-15 with full facemask--Dr. Alva 2017.  Getting mask fitting/leak issues worked out as of 07/2016 pulm f/u.   Osteoarthritis, multiple sites    primarily  knees and back.   Stricture and stenosis of esophagus    Venous insufficiency of both lower extremities     Past Surgical History:  Procedure Laterality Date   CARDIOVASCULAR STRESS TEST  2006; 04/27/10; 10/2017   Normal stress nuclear study, normal EF.  2019: EF 40%, inferior ischemia--intermediate risk study-->Dr. Einar Gip to do cath.   COLONOSCOPY  x 3   Most recent-->12/19/17 (after + cologuard) multiple adenomatous polyps-->recall 3 yrs.   LEFT HEART CATH AND CORONARY ANGIOGRAPHY N/A 01/06/2018   One area of 80% ostial stenosis--small and hard to visualize. No intervention.  DAPT for >1 yr recommended.  Procedure: LEFT HEART CATH AND CORONARY ANGIOGRAPHY;  Surgeon: Adrian Prows, MD;  Location: [facility];  Service: Cardiovascular;  Laterality: N/A;   NASAL SINUS SURGERY     Sleep study  07/2016   OSA; CPAP trial at 10 cm H20 recommended by pulm (Dr. Tennis Must Dios--[place] [facility]).   [place]   ? Discectomy   TEE WITHOUT CARDIOVERSION  05/2010   Dr. Einar Gip; severe LVH, no valvular abnormalities   TRANSESOPHAGEAL ECHOCARDIOGRAM  06/06/10; 12/2011; 09/2014; 11/2016   LVOT obst with 135 mm Hg PG. HOCM. No AV obstruction to flow. No mitral regurg..  Repeat echo 01/09/12 no change.  2016: EF 60%, severe conc LV hypertrophy, normal global wall motion, LA mildly dilated, systolic anterior motion of mitral valve chord, mild MR, mild TR, mild pulm HTN--no signif change since 2013.  Repeat echo 11/2016 and 10/2017 no signif change compared to prior studies.   TRANSTHORACIC ECHOCARDIOGRAM  05/28/2010; 10/2017   2012; Normal LVEF, LVOT obstruction, lateral wall hypokinesis.  10/2017: EF 55%, hypertrophic CM, grd II dd, normal global wall motion.    Family History  Problem Relation Age of Onset   Diabetes Mother    Prostate cancer Father        Prostate   Breast cancer Sister    Diabetes Maternal Aunt    Breast cancer Maternal Aunt    Hyperlipidemia Sister    Hypertension Neg Hx    Coronary artery disease Neg Hx    Colon cancer Neg Hx    Stomach cancer Neg Hx    Rectal cancer Neg Hx    Esophageal cancer Neg Hx     Social History   Socioeconomic History   Marital status: Married    Spouse name: Not on file   Number of children: 1   Years of education: Not on file   Highest education level: Not on file  Occupational History   Occupation: retired    Fish farm manager: RETIRED  Tobacco Use   Smoking status: Former    Packs/day: 0.50    Years: 40.00    Pack years: 20.00    Types: Cigarettes, Cigars    Quit date: 04/29/1998    Years since quitting: 22.8   Smokeless tobacco: Never   Tobacco comments:    4 cigars  Vaping Use   Vaping Use: Never used  Substance and Sexual Activity    Alcohol use: Yes    Comment: occasionally   Drug use: No   Sexual activity: Not on file  Other Topics Concern   Not on file  Social History Narrative   Married, 1 son, 1 granddaughter.   Retired from [employer] 2000.   Hobbies: trading farm equipment.  Used to be a Environmental manager.   Tobacco x many years, quit @ 2005   No ETOH or drugs.  No regular exercise.Smoking Status:  quit            Social Determinants of Health   Financial Resource Strain: Low Risk    Difficulty of Paying Living Expenses: Not hard at all  Food Insecurity: No Food Insecurity   Worried About Charity fundraiser in the Last Year: Never true   [place] in the Last Year: Never true  Transportation Needs: No Transportation Needs   Lack of Transportation (Medical): No   Lack of Transportation (Non-Medical): No  Physical Activity: Sufficiently Active   Days of Exercise per Week: 7 days   Minutes of Exercise per Session: 30 min  Stress: No Stress Concern Present   Feeling of Stress : Not at all  Social Connections: Moderately Integrated   Frequency of Communication with Friends and Family: More than three times a week   Frequency of Social Gatherings with Friends and Family: Once a week   Attends Religious Services: More than 4 times per year   Active Member of [employer] or Organizations: No   Attends Archivist Meetings: Never   Marital Status: Married  Human resources officer Violence: Not At Risk   Fear of Current or Ex-Partner: No   Emotionally Abused: No   Physically Abused: No   Sexually Abused: No    Outpatient Medications Prior to Visit  Medication Sig Dispense Refill   albuterol (PROAIR HFA) 108 (90 Base) MCG/ACT inhaler INHALE 2 PUFFS INTO THE LUNGS EVERY 4 HOURS AS NEEDED FOR WHEEZING 8.5 g 0   allopurinol (ZYLOPRIM) 300 MG tablet TAKE 1 TABLET BY MOUTH  TWICE DAILY 180 tablet 3   aspirin 81 MG tablet Take 81 mg by mouth daily.     chlorthalidone (HYGROTON)  25 MG tablet TAKE 1 TABLET BY MOUTH EVERY MORNING 90 tablet 2   Cholecalciferol (VITAMIN D) 2000 UNITS tablet Take 2,000 Units by mouth daily.     diclofenac Sodium (VOLTAREN) 1 % GEL Apply 2 g topically 4 (four) times daily. 100 g 3   docusate sodium (COLACE) 100 MG capsule Take 100 mg by mouth every other day.     empagliflozin (JARDIANCE) 25 MG TABS tablet Take 25 mg by mouth daily before breakfast. 30 tablet 11   fluticasone (FLONASE) 50 MCG/ACT nasal spray USE 2 SPRAYS IN BOTH  NOSTRILS DAILY AS NEEDED  FOR ALLERGIES. 48 g 0   fluticasone (FLOVENT HFA) 110 MCG/ACT inhaler Inhale 2 puffs into the lungs 2 (two) times daily. 1 each 1   glipiZIDE (GLUCOTROL XL) 10 MG 24 hr tablet Take 1 tablet (10 mg total) by mouth daily. 90 tablet 3   glucose blood (ONE TOUCH ULTRA TEST) test strip Use as instructed to check blood sugar.  DX 250.00 100 each prn   metFORMIN (GLUCOPHAGE) 1000 MG tablet TAKE 1 TABLET BY MOUTH  TWICE DAILY WITH MEALS 180 tablet 0   metoprolol tartrate (LOPRESSOR) 50 MG tablet TAKE 1 TABLET BY MOUTH  TWICE DAILY 180 tablet 3   ONE TOUCH LANCETS MISC Use as directed to check blood sugar.  DX 250.00 200 each prn   pantoprazole (PROTONIX) 40 MG tablet Take 1 tablet (40 mg total) by mouth daily. 90 tablet 3   rosuvastatin (CRESTOR) 20 MG tablet Take 1 tablet (20 mg total) by mouth at bedtime. 90 tablet 3   traZODone (DESYREL) 50 MG tablet TAKE 1 TABLET(50 MG) BY MOUTH AT BEDTIME 30 tablet 1   verapamil (CALAN-SR) 120 MG CR tablet  Take 1 tablet (120 mg total) by mouth every morning. 90 tablet 3   traZODone (DESYREL) 50 MG tablet TAKE 1 TABLET(50 MG) BY MOUTH AT BEDTIME 30 tablet 1   No facility-administered medications prior to visit.    No Known Allergies  ROS Review of Systems  Constitutional:  Negative for appetite change, chills, fatigue and fever.  HENT:  Negative for congestion, dental problem, ear pain and sore throat.   Eyes:  Negative for discharge, redness and visual  disturbance.  Respiratory:  Negative for cough, chest tightness, shortness of breath and wheezing.   Cardiovascular:  Negative for chest pain, palpitations and leg swelling.  Gastrointestinal:  Negative for abdominal pain, blood in stool, diarrhea, nausea and vomiting.  Genitourinary:  Negative for difficulty urinating, dysuria, flank pain, frequency, hematuria and urgency.  Musculoskeletal:  Negative for arthralgias, back pain, joint swelling, myalgias and neck stiffness.  Skin:  Negative for pallor and rash.  Neurological:  Negative for dizziness, speech difficulty, weakness and headaches.  Hematological:  Negative for adenopathy. Does not bruise/bleed easily.  Psychiatric/Behavioral:  Negative for confusion and sleep disturbance. The patient is not nervous/anxious.    PE; Vitals with BMI 02/16/2021 11/10/2020 08/07/2020  Height 5\' 8"  5\' 8"  5\' 8"   Weight 260 lbs 256 lbs 10 oz 251 lbs 6 oz  BMI 39.54 67.61 95.09  Systolic 326 712 458  Diastolic 74 77 70  Pulse 61 62 60     Gen: Alert, well appearing.  Patient is oriented to person, place, time, and situation. AFFECT: pleasant, lucid thought and speech. ENT: Ears: EACs clear, normal epithelium.  TMs with good light reflex and landmarks bilaterally.  Eyes: no injection, icteris, swelling, or exudate.  EOMI, PERRLA. Nose: no drainage or turbinate edema/swelling.  No injection or focal lesion.  Mouth: lips without lesion/swelling.  Oral mucosa pink and moist.  Dentition intact and without obvious caries or gingival swelling.  Oropharynx without erythema, exudate, or swelling.  Neck: supple/nontender.  No LAD, mass, or TM.  Carotid pulses 2+ bilaterally, without bruits. CV: RRR, 3-4/6 crescendo/decrescendo syst murmur.  No diastolic murmur, no r/g.   LUNGS: CTA bilat, nonlabored resps, good aeration in all lung fields. ABD: soft, NT, ND but large diastasis rectus defect present. BS normal.  No hepatospenomegaly or mass.  No bruits. EXT: no  clubbing or cyanosis.  2+ bilat LL pitting edema.  Musculoskeletal: no joint swelling, erythema, warmth, or tenderness.  ROM of all joints intact. Skin - no sores or suspicious lesions or rashes or color changes  Pertinent labs:  Lab Results  Component Value Date   TSH 1.89 03/05/2019   Lab Results  Component Value Date   WBC 8.3 08/07/2020   HGB 16.7 08/07/2020   HCT 49.9 08/07/2020   MCV 92.7 08/07/2020   PLT 164.0 08/07/2020   Lab Results  Component Value Date   IRON 93 05/26/2020   TIBC 353 05/26/2020   FERRITIN 44 05/26/2020   Lab Results  Component Value Date   CREATININE 0.89 08/07/2020   BUN 17 08/07/2020   NA 143 08/07/2020   K 4.2 08/07/2020   CL 107 08/07/2020   CO2 28 08/07/2020   Lab Results  Component Value Date   ALT 21 08/07/2020   AST 21 08/07/2020   ALKPHOS 58 08/07/2020   BILITOT 0.9 08/07/2020   Lab Results  Component Value Date   CHOL 106 08/07/2020   Lab Results  Component Value Date   HDL 40.70 08/07/2020  Lab Results  Component Value Date   LDLCALC 36 08/07/2020   Lab Results  Component Value Date   TRIG 145.0 08/07/2020   Lab Results  Component Value Date   CHOLHDL 3 08/07/2020   Lab Results  Component Value Date   PSA 0.81 02/28/2017   PSA 0.70 11/28/2015   PSA 0.71 04/19/2014   Lab Results  Component Value Date   HGBA1C 6.5 (A) 11/10/2020   HGBA1C 6.5 11/10/2020   HGBA1C 6.5 (A) 11/10/2020   HGBA1C 6.5 11/10/2020   ASSESSMENT AND PLAN:   1) DM: Historically well controlled.  Continue Jardiance 25 mg a day glipizide 10 mg/day and metformin 1000 mg twice a day. Hba1c and urine microalb/cr today.  2) HTN: Systolics mildly uncontrolled. Had a irbesartan 75 mg a day.  Continue chlorthalidone 25 mg a day, Lopressor 50 mg twice a day, and verapamil 120 mg/day. Lytes/cr today.  3) HLD: Tolerating 20 mg of rosuvastatin daily. LDL was 36 six mo ago. FLP and hepatic panel today.  4) IDA: hx of in 2021.  Hemoccults  were neg. Iron and Hb rose approp on oral supplement->he's off this now. CBC today.  5) Health maintenance exam: Reviewed age and gender appropriate health maintenance issues (prudent diet, regular exercise, health risks of tobacco and excessive alcohol, use of seatbelts, fire alarms in home, use of sunscreen).  Also reviewed age and gender appropriate health screening as well as vaccine recommendations. Vaccines: Flu->he has had it this season.  Otherwise all utd. Labs: fasting HP, Hba1c, urine microalb/cr. Prostate ca screening: at last cpe in 2020 pt had decided to discontinue further screening for prostate ca-->he reiterates this decision today. Colon ca screening: adenomatous polyps 2019.  Pt due for recall-->discussed.  An After Visit Summary was printed and given to the patient.  FOLLOW UP:  Return in about 2 weeks (around 03/02/2021) for f/u HTN.  Signed:  Crissie Sickles, MD           02/16/2021

## 2021-02-16 NOTE — Patient Instructions (Addendum)
Check your blood pressure and heart rate once daily for the next 2 wks and write numbers down to review with me at f/u visit in 2 wks.   Health Maintenance, Male Adopting a healthy lifestyle and getting preventive care are important in promoting health and wellness. Ask your health care provider about: The right schedule for you to have regular tests and exams. Things you can do on your own to prevent diseases and keep yourself healthy. What should I know about diet, weight, and exercise? Eat a healthy diet  Eat a diet that includes plenty of vegetables, fruits, low-fat dairy products, and lean protein. Do not eat a lot of foods that are high in solid fats, added sugars, or sodium. Maintain a healthy weight Body mass index (BMI) is a measurement that can be used to identify possible weight problems. It estimates body fat based on height and weight. Your health care provider can help determine your BMI and help you achieve or maintain a healthy weight. Get regular exercise Get regular exercise. This is one of the most important things you can do for your health. Most adults should: Exercise for at least 150 minutes each week. The exercise should increase your heart rate and make you sweat (moderate-intensity exercise). Do strengthening exercises at least twice a week. This is in addition to the moderate-intensity exercise. Spend less time sitting. Even light physical activity can be beneficial. Watch cholesterol and blood lipids Have your blood tested for lipids and cholesterol at 74 years of age, then have this test every 5 years. You may need to have your cholesterol levels checked more often if: Your lipid or cholesterol levels are high. You are older than 74 years of age. You are at high risk for heart disease. What should I know about cancer screening? Many types of cancers can be detected early and may often be prevented. Depending on your health history and family history, you may  need to have cancer screening at various ages. This may include screening for: Colorectal cancer. Prostate cancer. Skin cancer. Lung cancer. What should I know about heart disease, diabetes, and high blood pressure? Blood pressure and heart disease High blood pressure causes heart disease and increases the risk of stroke. This is more likely to develop in people who have high blood pressure readings, are of African descent, or are overweight. Talk with your health care provider about your target blood pressure readings. Have your blood pressure checked: Every 3-5 years if you are 74-63 years of age. Every year if you are 74 years old or older. If you are between the ages of 46 and 65 and are a current or former smoker, ask your health care provider if you should have a one-time screening for abdominal aortic aneurysm (AAA). Diabetes Have regular diabetes screenings. This checks your fasting blood sugar level. Have the screening done: Once every three years after age 51 if you are at a normal weight and have a low risk for diabetes. More often and at a younger age if you are overweight or have a high risk for diabetes. What should I know about preventing infection? Hepatitis B If you have a higher risk for hepatitis B, you should be screened for this virus. Talk with your health care provider to find out if you are at risk for hepatitis B infection. Hepatitis C Blood testing is recommended for: Everyone born from [birth year] through 1964. Anyone with known risk factors for hepatitis C. Sexually transmitted infections (STIs) You  should be screened each year for STIs, including gonorrhea and chlamydia, if: You are sexually active and are younger than 74 years of age. You are older than 74 years of age and your health care provider tells you that you are at risk for this type of infection. Your sexual activity has changed since you were last screened, and you are at increased risk for chlamydia or  gonorrhea. Ask your health care provider if you are at risk. Ask your health care provider about whether you are at high risk for HIV. Your health care provider may recommend a prescription medicine to help prevent HIV infection. If you choose to take medicine to prevent HIV, you should first get tested for HIV. You should then be tested every 3 months for as long as you are taking the medicine. Follow these instructions at home: Lifestyle Do not use any products that contain nicotine or tobacco, such as cigarettes, e-cigarettes, and chewing tobacco. If you need help quitting, ask your health care provider. Do not use street drugs. Do not share needles. Ask your health care provider for help if you need support or information about quitting drugs. Alcohol use Do not drink alcohol if your health care provider tells you not to drink. If you drink alcohol: Limit how much you have to 0-2 drinks a day. Be aware of how much alcohol is in your drink. In the [place] one drink equals one 12 oz bottle of beer (355 mL), one 5 oz glass of wine (148 mL), or one 1 oz glass of hard liquor (44 mL). General instructions Schedule regular health, dental, and eye exams. Stay current with your vaccines. Tell your health care provider if: You often feel depressed. You have ever been abused or do not feel safe at home. Summary Adopting a healthy lifestyle and getting preventive care are important in promoting health and wellness. Follow your health care provider's instructions about healthy diet, exercising, and getting tested or screened for diseases. Follow your health care provider's instructions on monitoring your cholesterol and blood pressure. This information is not intended to replace advice given to you by your health care provider. Make sure you discuss any questions you have with your health care provider. Document Revised: 06/23/2020 Document Reviewed: 04/08/2018 Elsevier Patient Education  2022  [employer].

## 2021-02-26 ENCOUNTER — Telehealth: Payer: Self-pay | Admitting: Family Medicine

## 2021-02-26 NOTE — Telephone Encounter (Signed)
Spoke with pt regarding recommendations,voiced understanding.

## 2021-02-26 NOTE — Telephone Encounter (Signed)
Pts wife called and said that the medicine he got last week has made him light headed and dizzy and he said he wasn't going to take anymore and that his yearly checkup with heart doctor is coming up ands that he will wait and go get everything checked with him. She said if you need to talk to him you can call him at [phone]

## 2021-02-26 NOTE — Telephone Encounter (Signed)
Ok this is fine.  Try to check a few blood pressures and heart rates at home prior to follow up with heart MD.

## 2021-02-26 NOTE — Telephone Encounter (Signed)
FYI. Please see below.  Pt was given irbesartan 75mg , take 1 tablet (75 mg total) by mouth daily for 30 day supply. Pt's f/u with Dr.Ganji is not until 11/28

## 2021-03-01 ENCOUNTER — Other Ambulatory Visit: Payer: Self-pay

## 2021-03-01 ENCOUNTER — Ambulatory Visit (INDEPENDENT_AMBULATORY_CARE_PROVIDER_SITE_OTHER): Payer: Medicare Other | Admitting: Family Medicine

## 2021-03-01 ENCOUNTER — Encounter: Payer: Self-pay | Admitting: Family Medicine

## 2021-03-01 VITALS — BP 143/75 | HR 60 | Temp 97.9°F | Resp 14 | Ht 68.0 in | Wt 258.0 lb

## 2021-03-01 DIAGNOSIS — I10 Essential (primary) hypertension: Secondary | ICD-10-CM

## 2021-03-01 MED ORDER — LISINOPRIL 5 MG PO TABS: ORAL_TABLET | ORAL | 0 refills | Status: DC

## 2021-03-01 NOTE — Patient Instructions (Signed)
New blood pressure medication (lisinopril):  take one tab daily. If top blood pressure number is not <140 after taking this med for 5d, then increase to TWO tabs daily.  Continue to take your chlorthalidone and metoprolol as usual.

## 2021-03-01 NOTE — Progress Notes (Addendum)
OFFICE VISIT  03/01/2021  CC:  Chief Complaint  Patient presents with   Hypertension    Follow up; pt is fasting. During last OV, rx given for irbesartan and tried taking for week and 1.2 but made him feel wobbly and dizzy. Last dose taken Sunday, has cardiology f/u on 11/28   HPI:    Patient is a 75 y.o. male who presents accompanied by his wife Arville Go for 2 wk f/u HTN. A/P as of last visit: "1) DM: Historically well controlled.  Continue Jardiance 25 mg a day glipizide 10 mg/day and metformin 1000 mg twice a day. Hba1c and urine microalb/cr today.   2) HTN: Systolics mildly uncontrolled. Add irbesartan 75 mg a day.  Continue chlorthalidone 25 mg a day, Lopressor 50 mg twice a day, and verapamil 120 mg/day. Lytes/cr today.   3) HLD: Tolerating 20 mg of rosuvastatin daily. LDL was 36 six mo ago. FLP and hepatic panel today.   4) IDA: hx of in 2021.  Hemoccults were neg. Iron and Hb rose approp on oral supplement->he's off this now. CBC today.   5) Health maintenance exam: Reviewed age and gender appropriate health maintenance issues (prudent diet, regular exercise, health risks of tobacco and excessive alcohol, use of seatbelts, fire alarms in home, use of sunscreen).  Also reviewed age and gender appropriate health screening as well as vaccine recommendations. Vaccines: Flu->he has had it this season.  Otherwise all utd. Labs: fasting HP, Hba1c, urine microalb/cr. Prostate ca screening: at last cpe in 2020 pt had decided to discontinue further screening for prostate ca-->he reiterates this decision today. Colon ca screening: adenomatous polyps 2019.  Pt due for recall-->discussed."   INTERIM HX: On 02/26/21 he called and stated he was not taking the irbesartan anymore because it made him dizzy. Trell feels well ever since discontinuing the irbesartan.  He did not check blood pressures while on the medication.  His wife found his blood pressure cuff and just checked it yesterday  and today his systolic was 258 and 527, diastolics in the 78E.  His heart rate is low 60s.  Past Medical History:  Diagnosis Date   Allergy    CAD (coronary artery disease)    see PSH section for details   Carotid bruit 05/28/10   Tortuosity of carotids on doppler u/s, no stenosis.   Cataract    Chronic diastolic heart failure (HCC)    Grd II DD   Colon cancer screening    cologuard POSITIVE 09/2017-->colonoscopy 12/19/17; multiple adenomatous polyps, recall 3 yrs.   COPD (chronic obstructive pulmonary disease) (HCC)    Diabetes mellitus    type 2- non insulin-requiring   DOE (dyspnea on exertion)    Cardiology w/u unrevealing except LVOT obstruction: suspect dyspnea due to HOCM, morbid obesity and OSA--stable as of 02/2017 cardiol f/u.   Fatty liver u/s and CT 2009   w/mildly elevated transaminases; u/s 12/2012 reconfirmed dx.  Hep B and C testing negative.   GERD (gastroesophageal reflux disease)    EDG 04-03-01, + distal esophageal stricture dilation   Gout    Heart murmur    Hiatal hernia    History of iron deficiency anemia 02/2019   Hb 12.9 02/2019->home hemoccults ordered but pt never returned these.  GI referral was done but pt did not return their call/unable to be contacted. Fall 2021 iron low but Hb normal->iron supp brought iron levels up (hemoccults neg x 3).   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 Grd II DD.     Moderate persistent asthma 04/19/2014   Morbid obesity (HCC)    OSA on CPAP    Could not tolerate CPAP, not surgical candidate per Dr. Benjamine Mola.  Restarted CPAP (auto titrate 5-15 with full facemask--Dr. Alva 2017.  Getting mask fitting/leak issues worked out as of 07/2016 pulm f/u.    Osteoarthritis, multiple sites    primarily knees and back.   Stricture and stenosis of esophagus    Venous insufficiency of both lower extremities     Past Surgical History:  Procedure Laterality Date   CARDIOVASCULAR STRESS TEST  2006; 04/27/10; 10/2017   Normal stress nuclear study, normal EF.  2019: EF 40%, inferior ischemia--intermediate risk study-->Dr. Einar Gip to do cath.   COLONOSCOPY  x 3   Most recent-->12/19/17 (after + cologuard) multiple adenomatous polyps-->recall 3 yrs.   LEFT HEART CATH AND CORONARY ANGIOGRAPHY N/A 01/06/2018   One area of 80% ostial stenosis--small and hard to visualize. No intervention.  DAPT for >1 yr recommended.  Procedure: LEFT HEART CATH AND CORONARY ANGIOGRAPHY;  Surgeon: Adrian Prows, MD;  Location: [facility];  Service: Cardiovascular;  Laterality: N/A;   NASAL SINUS SURGERY     Sleep study  07/2016   OSA; CPAP trial at 10 cm H20 recommended by pulm (Dr. Tennis Must Dios--[facility]).   [place]   ? Discectomy   TEE WITHOUT CARDIOVERSION  05/2010   Dr. Einar Gip; severe LVH, no valvular abnormalities   TRANSESOPHAGEAL ECHOCARDIOGRAM  06/06/10; 12/2011; 09/2014; 11/2016   LVOT obst with 135 mm Hg PG. HOCM. No AV obstruction to flow. No mitral regurg..  Repeat echo 01/09/12 no change.  2016: EF 60%, severe conc LV hypertrophy, normal global wall motion, LA mildly dilated, systolic anterior motion of mitral valve chord, mild MR, mild TR, mild pulm HTN--no signif change since 2013.  Repeat echo 11/2016 and 10/2017 no signif change compared to prior studies.   TRANSTHORACIC ECHOCARDIOGRAM  05/28/2010; 10/2017   2012; Normal LVEF, LVOT obstruction, lateral wall hypokinesis.  10/2017: EF 55%, hypertrophic CM, grd II dd, normal global wall motion.    Outpatient Medications Prior to Visit  Medication Sig Dispense Refill   albuterol (PROAIR HFA) 108 (90 Base) MCG/ACT inhaler INHALE 2 PUFFS INTO THE LUNGS EVERY 4 HOURS AS NEEDED FOR WHEEZING 8.5 g 0   allopurinol  (ZYLOPRIM) 300 MG tablet TAKE 1 TABLET BY MOUTH  TWICE DAILY 180 tablet 3   aspirin 81 MG tablet Take 81 mg by mouth daily.     chlorthalidone (HYGROTON) 25 MG tablet TAKE 1 TABLET BY MOUTH EVERY MORNING 90 tablet 2   Cholecalciferol (VITAMIN D) 2000 UNITS tablet Take 2,000 Units by mouth daily.     diclofenac Sodium (VOLTAREN) 1 % GEL Apply 2 g topically 4 (four) times daily. 100 g 3   docusate sodium (COLACE) 100 MG capsule Take 100 mg by mouth every other day.     empagliflozin (JARDIANCE) 25 MG TABS tablet Take 25 mg by mouth daily before breakfast. 30 tablet 11   fluticasone (FLONASE) 50 MCG/ACT nasal spray USE 2 SPRAYS IN BOTH  NOSTRILS DAILY AS NEEDED  FOR ALLERGIES. 48 g 0   fluticasone (  FLOVENT HFA) 110 MCG/ACT inhaler Inhale 2 puffs into the lungs 2 (two) times daily. 1 each 1   glipiZIDE (GLUCOTROL XL) 10 MG 24 hr tablet Take 1 tablet (10 mg total) by mouth daily. 90 tablet 3   glucose blood (ONE TOUCH ULTRA TEST) test strip Use as instructed to check blood sugar.  DX 250.00 100 each prn   metFORMIN (GLUCOPHAGE) 1000 MG tablet TAKE 1 TABLET BY MOUTH  TWICE DAILY WITH MEALS 180 tablet 0   metoprolol tartrate (LOPRESSOR) 50 MG tablet TAKE 1 TABLET BY MOUTH  TWICE DAILY 180 tablet 3   ONE TOUCH LANCETS MISC Use as directed to check blood sugar.  DX 250.00 200 each prn   pantoprazole (PROTONIX) 40 MG tablet Take 1 tablet (40 mg total) by mouth daily. 90 tablet 3   rosuvastatin (CRESTOR) 20 MG tablet Take 1 tablet (20 mg total) by mouth at bedtime. 90 tablet 3   traZODone (DESYREL) 50 MG tablet TAKE 1 TABLET(50 MG) BY MOUTH AT BEDTIME 30 tablet 1   verapamil (CALAN-SR) 120 MG CR tablet Take 1 tablet (120 mg total) by mouth every morning. 90 tablet 3   irbesartan (AVAPRO) 75 MG tablet Take 1 tablet (75 mg total) by mouth daily. (Patient not taking: Reported on 03/01/2021) 30 tablet 0   No facility-administered medications prior to visit.    No Known Allergies  ROS As per  HPI  PE: Vitals with BMI 03/01/2021 02/16/2021 11/10/2020  Height 5\' 8"  5\' 8"  5\' 8"   Weight 258 lbs 260 lbs 256 lbs 10 oz  BMI 39.24 97.02 63.78  Systolic 588 502 774  Diastolic 75 74 77  Pulse 60 61 62     Gen: Alert, well appearing.  Patient is oriented to person, place, time, and situation. AFFECT: pleasant, lucid thought and speech. CV: RRR, 2/6 systolic murmur, S1 was obscured, S2 normal.  No diastolic murmur.  No r/g. EXT: no clubbing or cyanosis.  2+ bilat LL pitting edema.    LABS:    Chemistry      Component Value Date/Time   NA 142 02/16/2021 0931   NA 138 06/17/2020 1149   K 4.4 02/16/2021 0931   CL 108 02/16/2021 0931   CO2 26 02/16/2021 0931   BUN 15 02/16/2021 0931   BUN 17 06/17/2020 1149   CREATININE 0.92 02/16/2021 0931      Component Value Date/Time   CALCIUM 9.8 02/16/2021 0931   ALKPHOS 58 02/16/2021 0931   AST 31 02/16/2021 0931   ALT 23 02/16/2021 0931   BILITOT 1.3 (H) 02/16/2021 0931   BILITOT 0.9 06/17/2020 1149     IMPRESSION AND PLAN:  Hypertension, uncontrolled systolics.  Did not tolerate irbesartan. Start trial of lisinopril 5 mg a day.  Instructions given to increase to 2 tabs a day after 5 days if systolics not less than 128.  They will try to monitor blood pressure more often at home.  Follow-up in 2 weeks to review these and will get a metabolic panel at that time.  An After Visit Summary was printed and given to the patient.  FOLLOW UP: Return in about 2 weeks (around 03/15/2021) for f/u HTN, get bmet.  Signed:  Crissie Sickles, MD           03/01/2021

## 2021-03-04 ENCOUNTER — Encounter: Payer: Self-pay | Admitting: Internal Medicine

## 2021-03-12 ENCOUNTER — Ambulatory Visit: Payer: Medicare Other | Admitting: Cardiology

## 2021-03-15 DIAGNOSIS — G4733 Obstructive sleep apnea (adult) (pediatric): Secondary | ICD-10-CM | POA: Diagnosis not present

## 2021-03-20 ENCOUNTER — Other Ambulatory Visit: Payer: Self-pay

## 2021-03-20 ENCOUNTER — Ambulatory Visit (INDEPENDENT_AMBULATORY_CARE_PROVIDER_SITE_OTHER): Payer: Medicare Other | Admitting: Family Medicine

## 2021-03-20 ENCOUNTER — Encounter: Payer: Self-pay | Admitting: Family Medicine

## 2021-03-20 VITALS — BP 126/73 | HR 58 | Temp 97.6°F | Ht 68.0 in | Wt 252.2 lb

## 2021-03-20 DIAGNOSIS — I10 Essential (primary) hypertension: Secondary | ICD-10-CM | POA: Diagnosis not present

## 2021-03-20 LAB — BASIC METABOLIC PANEL
BUN: 17 mg/dL (ref 6–23)
CO2: 26 mEq/L (ref 19–32)
Calcium: 9.8 mg/dL (ref 8.4–10.5)
Chloride: 104 mEq/L (ref 96–112)
Creatinine, Ser: 1.06 mg/dL (ref 0.40–1.50)
GFR: 69.33 mL/min (ref >60.00)
Glucose, Bld: 92 mg/dL (ref 70–99)
Potassium: 4.5 mEq/L (ref 3.5–5.1)
Sodium: 140 mEq/L (ref 135–145)

## 2021-03-20 MED ORDER — LISINOPRIL 10 MG PO TABS: 10.0000 mg | ORAL_TABLET | Freq: Every day | ORAL | 3 refills | Status: DC

## 2021-03-20 NOTE — Progress Notes (Signed)
OFFICE VISIT  03/20/2021  CC:  Chief Complaint  Patient presents with   Follow-up    Hypertension, pt is fasting.   HPI:    Patient is a 75 y.o. male who presents accompanied by his wife Mechele Claude for 2 week f/u uncontrolled HTN. A/P as of last visit: "Hypertension, uncontrolled systolics.  Did not tolerate irbesartan. Start trial of lisinopril 5 mg a day.  Instructions given to increase to 2 tabs a day after 5 days if systolics not less than 778.  They will try to monitor blood pressure more often at home.  Follow-up in 2 weeks to review these and will get a metabolic panel at that time."  INTERIM HX: He feels well. Home blood pressures reviewed today: They is still largely in the 242P to 536R systolic, consistently in the 44R diastolic.  Heart rate 58-73.  One one day he did take 5 mg lisinopril twice a day and his blood pressure was 122/67, a second check that day was 138/73.  Past Medical History:  Diagnosis Date   Allergy    CAD (coronary artery disease)    see PSH section for details   Carotid bruit 05/28/10   Tortuosity of carotids on doppler u/s, no stenosis.   Cataract    Chronic diastolic heart failure (HCC)    Grd II DD   Colon cancer screening    cologuard POSITIVE 09/2017-->colonoscopy 12/19/17; multiple adenomatous polyps, recall 3 yrs.   COPD (chronic obstructive pulmonary disease) (HCC)    Diabetes mellitus    type 2- non insulin-requiring   DOE (dyspnea on exertion)    Cardiology w/u unrevealing except LVOT obstruction: suspect dyspnea due to HOCM, morbid obesity and OSA--stable as of 02/2017 cardiol f/u.   Fatty liver u/s and CT 2009   w/mildly elevated transaminases; u/s 12/2012 reconfirmed dx.  Hep B and C testing negative.   GERD (gastroesophageal reflux disease)    EDG 04-03-01, + distal esophageal stricture dilation   Gout    Heart murmur    Hiatal hernia    History of iron deficiency anemia 02/2019   Hb 12.9 02/2019->home hemoccults ordered but pt never  returned these.  GI referral was done but pt did not return their call/unable to be contacted. Fall 2021 iron low but Hb normal->iron supp brought iron levels up (hemoccults neg x 3).   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 Grd II DD.     Moderate persistent asthma 04/19/2014   Morbid obesity (HCC)    OSA on CPAP    Could not tolerate CPAP, not surgical candidate per Dr. Benjamine Mola.  Restarted CPAP (auto titrate 5-15 with full facemask--Dr. Alva 2017.  Getting mask fitting/leak issues worked out as of 07/2016 pulm f/u.   Osteoarthritis, multiple sites    primarily knees and back.   Stricture and stenosis of esophagus    Venous insufficiency of both lower extremities     Past Surgical History:  Procedure Laterality Date   CARDIOVASCULAR STRESS TEST  2006; 04/27/10; 10/2017   Normal stress nuclear study, normal EF.  2019: EF 40%, inferior ischemia--intermediate risk study-->Dr. Einar Gip to do cath.   COLONOSCOPY  x 3   Most recent-->12/19/17 (after + cologuard)  multiple adenomatous polyps-->recall 3 yrs.   LEFT HEART CATH AND CORONARY ANGIOGRAPHY N/A 01/06/2018   One area of 80% ostial stenosis--small and hard to visualize. No intervention.  DAPT for >1 yr recommended.  Procedure: LEFT HEART CATH AND CORONARY ANGIOGRAPHY;  Surgeon: Adrian Prows, MD;  Location: [facility];  Service: Cardiovascular;  Laterality: N/A;   NASAL SINUS SURGERY     Sleep study  07/2016   OSA; CPAP trial at 10 cm H20 recommended by pulm (Dr. Tennis Must Dios--[facility]).   [place]   ? Discectomy   TEE WITHOUT CARDIOVERSION  05/2010   Dr. Einar Gip; severe LVH, no valvular abnormalities   TRANSESOPHAGEAL ECHOCARDIOGRAM  06/06/10; 12/2011;  09/2014; 11/2016   LVOT obst with 135 mm Hg PG. HOCM. No AV obstruction to flow. No mitral regurg..  Repeat echo 01/09/12 no change.  2016: EF 60%, severe conc LV hypertrophy, normal global wall motion, LA mildly dilated, systolic anterior motion of mitral valve chord, mild MR, mild TR, mild pulm HTN--no signif change since 2013.  Repeat echo 11/2016 and 10/2017 no signif change compared to prior studies.   TRANSTHORACIC ECHOCARDIOGRAM  05/28/2010; 10/2017   2012; Normal LVEF, LVOT obstruction, lateral wall hypokinesis.  10/2017: EF 55%, hypertrophic CM, grd II dd, normal global wall motion.    Outpatient Medications Prior to Visit  Medication Sig Dispense Refill   albuterol (PROAIR HFA) 108 (90 Base) MCG/ACT inhaler INHALE 2 PUFFS INTO THE LUNGS EVERY 4 HOURS AS NEEDED FOR WHEEZING 8.5 g 0   allopurinol (ZYLOPRIM) 300 MG tablet TAKE 1 TABLET BY MOUTH  TWICE DAILY 180 tablet 3   aspirin 81 MG tablet Take 81 mg by mouth daily.     chlorthalidone (HYGROTON) 25 MG tablet TAKE 1 TABLET BY MOUTH EVERY MORNING 90 tablet 2   Cholecalciferol (VITAMIN D) 2000 UNITS tablet Take 2,000 Units by mouth daily.     diclofenac Sodium (VOLTAREN) 1 % GEL Apply 2 g topically 4 (four) times daily. 100 g 3   docusate sodium (COLACE) 100 MG capsule Take 100 mg by mouth every other day.     empagliflozin (JARDIANCE) 25 MG TABS tablet Take 25 mg by mouth daily before breakfast. 30 tablet 11   fluticasone (FLONASE) 50 MCG/ACT nasal spray USE 2 SPRAYS IN BOTH  NOSTRILS DAILY AS NEEDED  FOR ALLERGIES. 48 g 0   fluticasone (FLOVENT HFA) 110 MCG/ACT inhaler Inhale 2 puffs into the lungs 2 (two) times daily. 1 each 1   glipiZIDE (GLUCOTROL XL) 10 MG 24 hr tablet Take 1 tablet (10 mg total) by mouth daily. 90 tablet 3   glucose blood (ONE TOUCH ULTRA TEST) test strip Use as instructed to check blood sugar.  DX 250.00 100 each prn   metFORMIN (GLUCOPHAGE) 1000 MG tablet TAKE 1 TABLET BY MOUTH  TWICE DAILY WITH MEALS 180 tablet 0    metoprolol tartrate (LOPRESSOR) 50 MG tablet TAKE 1 TABLET BY MOUTH  TWICE DAILY 180 tablet 3   ONE TOUCH LANCETS MISC Use as directed to check blood sugar.  DX 250.00 200 each prn   pantoprazole (PROTONIX) 40 MG tablet Take 1 tablet (40 mg total) by mouth daily. 90 tablet 3   rosuvastatin (CRESTOR) 20 MG tablet Take 1 tablet (20 mg total) by mouth at bedtime. 90 tablet 3   traZODone (DESYREL) 50 MG tablet TAKE 1 TABLET(50 MG) BY MOUTH AT BEDTIME 30 tablet 1   verapamil (CALAN-SR) 120 MG CR tablet Take  1 tablet (120 mg total) by mouth every morning. 90 tablet 3   lisinopril (ZESTRIL) 5 MG tablet 1 tab po qd 30 tablet 0   No facility-administered medications prior to visit.    No Known Allergies  ROS As per HPI  PE: Vitals with BMI 03/20/2021 03/01/2021 02/16/2021  Height 5\' 8"  5\' 8"  5\' 8"   Weight 252 lbs 3 oz 258 lbs 260 lbs  BMI 38.36 62.95 28.41  Systolic 324 401 027  Diastolic 73 75 74  Pulse 58 60 61    Gen: Alert, well appearing.  Patient is oriented to person, place, time, and situation. AFFECT: pleasant, lucid thought and speech. No further exam today.  LABS:    Chemistry      Component Value Date/Time   NA 142 02/16/2021 0931   NA 138 06/17/2020 1149   K 4.4 02/16/2021 0931   CL 108 02/16/2021 0931   CO2 26 02/16/2021 0931   BUN 15 02/16/2021 0931   BUN 17 06/17/2020 1149   CREATININE 0.92 02/16/2021 0931      Component Value Date/Time   CALCIUM 9.8 02/16/2021 0931   ALKPHOS 58 02/16/2021 0931   AST 31 02/16/2021 0931   ALT 23 02/16/2021 0931   BILITOT 1.3 (H) 02/16/2021 0931   BILITOT 0.9 06/17/2020 1149     Lab Results  Component Value Date   HGBA1C 6.2 02/16/2021    IMPRESSION AND PLAN:  Hypertension, control improved on 5 mg lisinopril twice a day. Will have him take a lisinopril 10 mg tab daily now in addition to his Lopressor 50 mg twice a day and his chlorthalidone 25 mg a day. Electrolytes and creatinine checked today.  Next a1c 2 mo  An  After Visit Summary was printed and given to the patient.  FOLLOW UP: Return in about 2 months (around 05/20/2021) for routine chronic illness f/u. Next cpe 01/2022  Signed:  Crissie Sickles, MD           03/20/2021

## 2021-03-21 ENCOUNTER — Ambulatory Visit: Payer: Medicare Other

## 2021-03-26 ENCOUNTER — Ambulatory Visit: Payer: Medicare Other | Admitting: Cardiology

## 2021-03-26 ENCOUNTER — Other Ambulatory Visit: Payer: Self-pay

## 2021-03-26 ENCOUNTER — Encounter: Payer: Self-pay | Admitting: Cardiology

## 2021-03-26 VITALS — BP 123/74 | HR 65 | Temp 98.9°F | Resp 16 | Ht 68.0 in | Wt 255.8 lb

## 2021-03-26 DIAGNOSIS — I10 Essential (primary) hypertension: Secondary | ICD-10-CM | POA: Diagnosis not present

## 2021-03-26 DIAGNOSIS — E7800 Pure hypercholesterolemia, unspecified: Secondary | ICD-10-CM | POA: Insufficient documentation

## 2021-03-26 DIAGNOSIS — I421 Obstructive hypertrophic cardiomyopathy: Secondary | ICD-10-CM

## 2021-03-26 NOTE — Progress Notes (Signed)
Primary Physician/Referring:  Tammi Sou, MD  Patient ID: Jack [name], male    DOB: 07-26-1945, 75 y.o.   MRN: [id number]  No chief complaint on file.  HPI:    Jack [name]  is a 75 y.o. 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    He is here in annual visit, states that he is doing well, dyspnea has remained stable, leg edema is also remained stable and very minimal.     Past Medical History:  Diagnosis Date  . Allergy   . CAD (coronary artery disease)    see PSH section for details  . Carotid bruit 05/28/10   Tortuosity of carotids on doppler u/s, no stenosis.  . Cataract   . Chronic diastolic heart failure (HCC)    Grd II DD  . Colon cancer screening    cologuard POSITIVE 09/2017-->colonoscopy 12/19/17; multiple adenomatous polyps, recall 3 yrs.  Marland Kitchen COPD (chronic obstructive pulmonary disease) ([place])   . Diabetes mellitus    type 2- non insulin-requiring  . DOE (dyspnea on exertion)    Cardiology w/u unrevealing except LVOT obstruction: suspect dyspnea due to HOCM, morbid obesity and OSA--stable as of 02/2017 cardiol f/u.  Marland Kitchen Fatty liver u/s and CT 2009   w/mildly elevated transaminases; u/s 12/2012 reconfirmed dx.  Hep B and C testing negative.  Marland Kitchen GERD (gastroesophageal reflux disease)    EDG 04-03-01, + distal esophageal stricture dilation  . Gout   . Heart murmur   . Hiatal hernia   . History of iron deficiency anemia 02/2019   Hb 12.9 02/2019->home hemoccults ordered but pt never returned these.  GI referral was done but pt did not return their call/unable to be contacted. Fall 2021 iron low but Hb normal->iron supp brought iron levels up (hemoccults neg x 3).  Marland Kitchen Hx of adenomatous colonic polyps 12/27/2017  . Hyperlipidemia    . Hypertension    severe LVH on echo  . Hypertrophic obstructive cardiomyopathy (HOCM) ([place]) 2012   LVOT obstruction stable on echo 01/09/12 and again on f/u echo 09/29/14, 11/2016, 10/2017--with evidence of HOCM due to chordal systolic anterior motion of MV leaflet. Grd II DD.    . Moderate persistent asthma 04/19/2014  . Morbid obesity ([place])   . OSA on CPAP    Could not tolerate CPAP, not surgical candidate per Dr. Benjamine Mola.  Restarted CPAP (auto titrate 5-15 with full facemask--Dr. Alva 2017.  Getting mask fitting/leak issues worked out as of 07/2016 pulm f/u.  . Osteoarthritis, multiple sites    primarily knees and back.  . Stricture and stenosis of esophagus   . Venous insufficiency of both lower extremities    Past Surgical History:  Procedure Laterality Date  . CARDIOVASCULAR STRESS TEST  2006; 04/27/10; 10/2017   Normal stress nuclear study, normal EF.  2019: EF 40%, inferior ischemia--intermediate risk study-->Dr. Einar Gip to do cath.  . COLONOSCOPY  x 3   Most recent-->12/19/17 (after + cologuard) multiple adenomatous polyps-->recall 3 yrs.  Marland Kitchen LEFT HEART CATH AND CORONARY ANGIOGRAPHY N/A 01/06/2018   One area of 80% ostial stenosis--small and hard to visualize. No intervention.  DAPT for >1 yr recommended.  Procedure: LEFT HEART CATH AND CORONARY ANGIOGRAPHY;  Surgeon: Adrian Prows, MD;  Location: [facility];  Service: Cardiovascular;  Laterality: N/A;  . NASAL SINUS SURGERY    . Sleep study  07/2016   OSA; CPAP trial at 10 cm H20 recommended by pulm (Dr. Tennis Must Dios--[place] pulm).  . Cook   ? Discectomy  . TEE WITHOUT CARDIOVERSION  05/2010   Dr. Einar Gip; severe LVH, no valvular abnormalities  . TRANSESOPHAGEAL ECHOCARDIOGRAM  06/06/10; 12/2011; 09/2014; 11/2016   LVOT obst with 135 mm Hg PG. HOCM. No AV obstruction to flow. No mitral regurg..  Repeat echo 01/09/12 no change.  2016: EF 60%, severe conc LV hypertrophy, normal global wall motion, LA mildly dilated, systolic anterior  motion of mitral valve chord, mild MR, mild TR, mild pulm HTN--no signif change since 2013.  Repeat echo 11/2016 and 10/2017 no signif change compared to prior studies.  . TRANSTHORACIC ECHOCARDIOGRAM  05/28/2010; 10/2017   2012; Normal LVEF, LVOT obstruction, lateral wall hypokinesis.  10/2017: EF 55%, hypertrophic CM, grd II dd, normal global wall motion.   Social History   Tobacco Use  . Smoking status: Former    Packs/day: 0.50    Years: 40.00    Pack years: 20.00    Types: Cigarettes, Cigars    Quit date: 04/29/1998    Years since quitting: 22.9  . Smokeless tobacco: Never  . Tobacco comments:    4 cigars  Substance Use Topics  . Alcohol use: Yes    Comment: occasionally    ROS  Review of Systems  Cardiovascular:  Positive for dyspnea on exertion and leg swelling. Negative for chest pain.  Gastrointestinal:  Negative for melena.  Objective   Vitals with BMI 03/26/2021 03/20/2021 03/01/2021  Height 5\' 8"  5\' 8"  5\' 8"   Weight 255 lbs 13 oz 252 lbs 3 oz 258 lbs  BMI 38.9 42.68 34.19  Systolic 622 297 989  Diastolic 74 73 75  Pulse 65 58 60    Physical Exam Constitutional:      Appearance: He is morbidly obese.     Comments: Well built and Morbidly obese in no acute distress.   Neck:     Thyroid: No thyromegaly.     Vascular: Carotid bruit (bilateral) present.     Comments: Short neck and difficult to evaluate JVP Cardiovascular:     Rate and Rhythm: Normal rate and regular rhythm.     Pulses: Intact distal pulses.     Heart sounds: Murmur heard.  Harsh midsystolic murmur is present at the upper right sternal border radiating to the neck.    No gallop.     Comments: Femoral and popliteal pulse difficult to feel due to patient's body habitus.  Pulmonary:     Effort: Pulmonary effort is normal.     Breath sounds: Normal breath sounds.  Abdominal:     General: Bowel sounds are normal.     Palpations: Abdomen is soft.     Comments: Obese. Pannus present  Musculoskeletal:      Cervical back: Neck supple.     Right lower leg: No edema.     Left lower leg: No edema.   Laboratory examination:   Recent Labs    06/17/20 1149 08/07/20 0901 02/16/21 0931 03/20/21 0926  NA 138 143 142 140  K 3.7 4.2 4.4 4.5  CL 101 107 108 104  CO2 19* 28 26 26   GLUCOSE 118* 130* 104* 92  BUN 17 17 15 17   CREATININE 0.94 0.89 0.92 1.06  CALCIUM 9.2 9.9 9.8 9.8  GFRNONAA 80  --   --   --  GFRAA 93  --   --   --    estimated creatinine clearance is 75.6 mL/min (by C-G formula based on SCr of 1.06 mg/dL).  CMP Latest Ref Rng & Units 03/20/2021 02/16/2021 08/07/2020  Glucose 70 - 99 mg/dL 92 104(H) 130(H)  BUN 6 - 23 mg/dL 17 15 17   Creatinine 0.40 - 1.50 mg/dL 1.06 0.92 0.89  Sodium 135 - 145 mEq/L 140 142 143  Potassium 3.5 - 5.1 mEq/L 4.5 4.4 4.2  Chloride 96 - 112 mEq/L 104 108 107  CO2 19 - 32 mEq/L 26 26 28   Calcium 8.4 - 10.5 mg/dL 9.8 9.8 9.9  Total Protein 6.0 - 8.3 g/dL - 6.7 6.2  Total Bilirubin 0.2 - 1.2 mg/dL - 1.3(H) 0.9  Alkaline Phos 39 - 117 U/L - 58 58  AST 0 - 37 U/L - 31 21  ALT 0 - 53 U/L - 23 21   CBC Latest Ref Rng & Units 02/16/2021 08/07/2020 06/17/2020  WBC 4.0 - 10.5 K/uL 7.8 8.3 5.7  Hemoglobin 13.0 - 17.0 g/dL 16.7 16.7 18.2(H)  Hematocrit 39.0 - 52.0 % 50.1 49.9 52.4(H)  Platelets 150.0 - 400.0 K/uL 136.0(L) 164.0 133(L)   Lipid Panel Recent Labs    08/07/20 0901 02/16/21 0931  CHOL 106 102  TRIG 145.0 118.0  LDLCALC 36 41  VLDL 29.0 23.6  HDL 40.70 37.60*  CHOLHDL 3 3    HEMOGLOBIN A1C Lab Results  Component Value Date   HGBA1C 6.2 02/16/2021   TSH Recent Labs    02/16/21 0931  TSH 2.34   Medications and allergies  No Known Allergies   Current Outpatient Medications on File Prior to Visit  Medication Sig Dispense Refill  . albuterol (PROAIR HFA) 108 (90 Base) MCG/ACT inhaler INHALE 2 PUFFS INTO THE LUNGS EVERY 4 HOURS AS NEEDED FOR WHEEZING 8.5 g 0  . allopurinol (ZYLOPRIM) 300 MG tablet TAKE 1 TABLET BY MOUTH   TWICE DAILY 180 tablet 3  . aspirin 81 MG tablet Take 81 mg by mouth daily.    . Cholecalciferol (VITAMIN D) 2000 UNITS tablet Take 2,000 Units by mouth daily.    . diclofenac Sodium (VOLTAREN) 1 % GEL Apply 2 g topically 4 (four) times daily. 100 g 3  . docusate sodium (COLACE) 100 MG capsule Take 100 mg by mouth every other day.    . empagliflozin (JARDIANCE) 25 MG TABS tablet Take 25 mg by mouth daily before breakfast. 30 tablet 11  . fluticasone (FLONASE) 50 MCG/ACT nasal spray USE 2 SPRAYS IN BOTH  NOSTRILS DAILY AS NEEDED  FOR ALLERGIES. 48 g 0  . fluticasone (FLOVENT HFA) 110 MCG/ACT inhaler Inhale 2 puffs into the lungs 2 (two) times daily. 1 each 1  . glipiZIDE (GLUCOTROL XL) 10 MG 24 hr tablet Take 1 tablet (10 mg total) by mouth daily. 90 tablet 3  . glucose blood (ONE TOUCH ULTRA TEST) test strip Use as instructed to check blood sugar.  DX 250.00 100 each prn  . lisinopril (ZESTRIL) 10 MG tablet Take 1 tablet (10 mg total) by mouth daily. 90 tablet 3  . metFORMIN (GLUCOPHAGE) 1000 MG tablet TAKE 1 TABLET BY MOUTH  TWICE DAILY WITH MEALS 180 tablet 0  . metoprolol tartrate (LOPRESSOR) 50 MG tablet TAKE 1 TABLET BY MOUTH  TWICE DAILY 180 tablet 3  . ONE TOUCH LANCETS MISC Use as directed to check blood sugar.  DX 250.00 200 each prn  . pantoprazole (PROTONIX) 40 MG tablet  Take 1 tablet (40 mg total) by mouth daily. 90 tablet 3  . rosuvastatin (CRESTOR) 20 MG tablet Take 1 tablet (20 mg total) by mouth at bedtime. 90 tablet 3  . traZODone (DESYREL) 50 MG tablet TAKE 1 TABLET(50 MG) BY MOUTH AT BEDTIME 30 tablet 1  . verapamil (CALAN-SR) 120 MG CR tablet Take 1 tablet (120 mg total) by mouth every morning. 90 tablet 3   No current facility-administered medications on file prior to visit.    Radiology:  No results found. Cardiac Studies:   TEE 06/06/10: Normal LVEF. Mod LVH, chordal SAM & LVOT obst with 136mm Hg PG. Not HOCM. No significant mitral regurgitation.    Carotid Doppler   [10/11/2013]: No evidence of hemodynamically significant stenosis in the bilateral carotid bifurcation vessels, Tortuosity may be the source of bruit. No significant change from 05/28/2010.  Echocardiogram   11/12/2017: Left ventricle cavity is normal in size. Severe concentric hypertrophy of the left ventricle. Posterior and septal walls both measure 2 cm in AP dimension. Normal global wall motion. Doppler evidence of grade II (pseudonormal) diastolic dysfunction, elevated LAP. Calculated EF 55%. Hypertrophic cardiomyopathy. Left atrial cavity is mildly dilated, measuring 4.6 cm in AP dimension. Inadequate tricuspid regurgitation jet to estimate pulmonary artery pressure. Normal right atrial pressure. No significant change compared to prior study dated 12/17/2016.  Nuclear stress test   11/10/2017: 1. Lexiscan stress test was performed. Exercise capacity was not assessed. Stress symptoms included dyspnea. Peak blood pressure was 150/82 mmHg. The resting and stress electrocardiogram demonstrated normal sinus rhythm, normal resting conduction, frequent multifocal PAC;s, lateral T wave inversions. Stress EKG is non diagnostic for ischemia as it is a pharmacologic stress. 2. The overall quality of the study is good. Left ventricular cavity is noted to be normal on the rest and stress studies. Gated SPECT images reveal normal myocardial thickening and wall motion. The left ventricular ejection fraction was calculated to be 40%, although visually looks normal. SPECT images demonstrate small perfusion abnormality of mild intensity in the basal inferior and mid inferior myocardial wall(s) on the stress images with mild reversibility. 3. Intermediate risk study.  Coronary angiogram 01/06/2018:  Mid LAD 20% stenosis, otherwise no significant disease. Normal LVEF. 140 mmHg intraventricular PG noted. LVEDP moderate to severely elevated.  EKG:  EKG 03/27/2019: Normal sinus rhythm at rate of 60 bpm, left ankle  enlargement, left axis deviation, left anterior fascicular block.  Incomplete right bundle branch block.  LVH with repolarization abnormality, cannot exclude inferior ischemia.  No significant change from 03/10/2020.  Assessment     ICD-10-CM   1. Essential hypertension  I10 EKG 12-Lead    2. Hypertrophic obstructive cardiomyopathy (HOCM) (HCC)  I42.1 PCV ECHOCARDIOGRAM COMPLETE    3. Pure hypercholesterolemia  E78.00     4. Class 2 severe obesity due to excess calories with serious comorbidity and body mass index (BMI) of 37.0 to 37.9 in adult [facility])  E66.01    Z68.37       No orders of the defined types were placed in this encounter.  Medications Discontinued During This Encounter  Medication Reason  . chlorthalidone (HYGROTON) 25 MG tablet      Recommendations:   No orders of the defined types were placed in this encounter.   ARMARI FUSSELL  is a 75 y.o.  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  This is a annual visit, he has not had any hospitalization, leg edema has remained stable and dyspnea is also stable.  No clinical evidence of heart failure.  His blood pressure is not well controlled since increasing the dose of lisinopril by his PCP.  Reviewed his external labs, lipids are also under excellent control.  No change in his physical exam, however it has been greater than 3 years since last echocardiogram, and his echocardiogram was ordered today.  I will see him back in a year or sooner if problems.  Weight loss again discussed with the patient.  He continues to have problems with insomnia, lifestyle changes and sleeping pattern changes again discussed with the patient and his wife.  Weight loss also discussed.    Adrian Prows, MD,  [facility] 03/26/2021, 3:21 PM Office: [phone] Pager: [phone]

## 2021-03-27 ENCOUNTER — Other Ambulatory Visit: Payer: Self-pay | Admitting: Family Medicine

## 2021-03-28 ENCOUNTER — Ambulatory Visit: Payer: Medicare Other

## 2021-04-12 DIAGNOSIS — C001 Malignant neoplasm of external lower lip: Secondary | ICD-10-CM | POA: Diagnosis not present

## 2021-04-26 ENCOUNTER — Telehealth: Payer: Self-pay | Admitting: Family Medicine

## 2021-04-26 NOTE — Telephone Encounter (Signed)
Pt MRN: [id number] [name](wife) called about a bill. Jack [name] bill is $585 dollars from office visit back in 02/16/21. I suggested she contact [facility]. She said she already talked to them.  She gave me a call back number since we don't deal with billing. [phone]--KR

## 2021-04-26 NOTE — Telephone Encounter (Signed)
Late entry from 04/25/2021 [Yesterday 4:24 PM] Jack [name] PtMRN: [id number] [name](wife) called about a bill. Jack [name] bill is $585 dollars from office back in 02/16/21. She gave me a call back number since we don't deal with billing. [phone]  KO 04/26/2021 Sent email to billing team to review charges and SP balance

## 2021-05-02 ENCOUNTER — Other Ambulatory Visit: Payer: Self-pay

## 2021-05-02 ENCOUNTER — Ambulatory Visit (INDEPENDENT_AMBULATORY_CARE_PROVIDER_SITE_OTHER): Payer: Medicare Other

## 2021-05-02 DIAGNOSIS — Z0000 Encounter for general adult medical examination without abnormal findings: Secondary | ICD-10-CM

## 2021-05-02 DIAGNOSIS — Z86010 Personal history of colonic polyps: Secondary | ICD-10-CM | POA: Diagnosis not present

## 2021-05-02 DIAGNOSIS — Z1211 Encounter for screening for malignant neoplasm of colon: Secondary | ICD-10-CM | POA: Diagnosis not present

## 2021-05-02 NOTE — Telephone Encounter (Signed)
Msg from [facility] that [id number] was voided. Notified pt wife no balance for phys billing at this time.

## 2021-05-02 NOTE — Progress Notes (Signed)
Virtual Visit via Telephone Note  I connected with  Jack [name] on 05/02/21 at  1:00 PM EST by telephone and verified that I am speaking with the correct person using two identifiers.  Medicare Annual Wellness visit completed telephonically due to Covid-19 pandemic.   Persons participating in this call: This Health Coach and this patient.   Location: Patient: Home Provider: Office   I discussed the limitations, risks, security and privacy concerns of performing an evaluation and management service by telephone and the availability of in person appointments. The patient expressed understanding and agreed to proceed.  Unable to perform video visit due to video visit attempted and failed and/or patient does not have video capability.   Some vital signs may be absent or patient reported.   Willette Brace, LPN   Subjective:   Jack [name] is a 76 y.o. male who presents for Medicare Annual/Subsequent preventive examination.  Review of Systems     Cardiac Risk Factors include: advanced age (>57men, >56 women);hypertension;diabetes mellitus;dyslipidemia;male gender;obesity (BMI >30kg/m2)     Objective:    There were no vitals filed for this visit. There is no height or weight on file to calculate BMI.  Advanced Directives 05/02/2021 03/15/2020 03/06/2018 01/06/2018 02/28/2017  Does Patient Have a Medical Advance Directive? Yes Yes Yes Yes Yes  Type of Paramedic of [place];Living will Living will;Healthcare Power of [place];Living will [place];Living will  Does patient want to make changes to medical advance directive? - - - No - Patient declined -  Copy of [place] in Chart? No - copy requested No - copy requested No - copy requested Yes No - copy requested    Current Medications (verified) Outpatient Encounter Medications as of 05/02/2021  Medication Sig    albuterol (PROAIR HFA) 108 (90 Base) MCG/ACT inhaler INHALE 2 PUFFS INTO THE LUNGS EVERY 4 HOURS AS NEEDED FOR WHEEZING   allopurinol (ZYLOPRIM) 300 MG tablet TAKE 1 TABLET BY MOUTH  TWICE DAILY   aspirin 81 MG tablet Take 81 mg by mouth daily.   Cholecalciferol (VITAMIN D) 2000 UNITS tablet Take 2,000 Units by mouth daily.   diclofenac Sodium (VOLTAREN) 1 % GEL Apply 2 g topically 4 (four) times daily.   docusate sodium (COLACE) 100 MG capsule Take 100 mg by mouth every other day.   empagliflozin (JARDIANCE) 25 MG TABS tablet Take 25 mg by mouth daily before breakfast.   fluticasone (FLONASE) 50 MCG/ACT nasal spray USE 2 SPRAYS IN BOTH  NOSTRILS DAILY AS NEEDED  FOR ALLERGIES.   fluticasone (FLOVENT HFA) 110 MCG/ACT inhaler Inhale 2 puffs into the lungs 2 (two) times daily.   glipiZIDE (GLUCOTROL XL) 10 MG 24 hr tablet Take 1 tablet (10 mg total) by mouth daily.   glucose blood (ONE TOUCH ULTRA TEST) test strip Use as instructed to check blood sugar.  DX 250.00   lisinopril (ZESTRIL) 10 MG tablet Take 1 tablet (10 mg total) by mouth daily.   metFORMIN (GLUCOPHAGE) 1000 MG tablet TAKE 1 TABLET BY MOUTH  TWICE DAILY WITH MEALS   metoprolol tartrate (LOPRESSOR) 50 MG tablet TAKE 1 TABLET BY MOUTH  TWICE DAILY   ONE TOUCH LANCETS MISC Use as directed to check blood sugar.  DX 250.00   pantoprazole (PROTONIX) 40 MG tablet Take 1 tablet (40 mg total) by mouth daily.   rosuvastatin (CRESTOR) 20 MG tablet Take 1 tablet (20 mg  total) by mouth at bedtime.   verapamil (CALAN-SR) 120 MG CR tablet Take 1 tablet (120 mg total) by mouth every morning.   FLUAD QUADRIVALENT 0.5 ML injection    traZODone (DESYREL) 50 MG tablet TAKE 1 TABLET(50 MG) BY MOUTH AT BEDTIME (Patient not taking: Reported on 05/02/2021)   No facility-administered encounter medications on file as of 05/02/2021.    Allergies (verified) Patient has no known allergies.   History: Past Medical History:  Diagnosis Date   Allergy    CAD  (coronary artery disease)    see PSH section for details   Carotid bruit 05/28/10   Tortuosity of carotids on doppler u/s, no stenosis.   Cataract    Chronic diastolic heart failure (HCC)    Grd II DD   Colon cancer screening    cologuard POSITIVE 09/2017-->colonoscopy 12/19/17; multiple adenomatous polyps, recall 3 yrs.   COPD (chronic obstructive pulmonary disease) (HCC)    Diabetes mellitus    type 2- non insulin-requiring   DOE (dyspnea on exertion)    Cardiology w/u unrevealing except LVOT obstruction: suspect dyspnea due to HOCM, morbid obesity and OSA--stable as of 02/2017 cardiol f/u.   Fatty liver u/s and CT 2009   w/mildly elevated transaminases; u/s 12/2012 reconfirmed dx.  Hep B and C testing negative.   GERD (gastroesophageal reflux disease)    EDG 04-03-01, + distal esophageal stricture dilation   Gout    Heart murmur    Hiatal hernia    History of iron deficiency anemia 02/2019   Hb 12.9 02/2019->home hemoccults ordered but pt never returned these.  GI referral was done but pt did not return their call/unable to be contacted. Fall 2021 iron low but Hb normal->iron supp brought iron levels up (hemoccults neg x 3).   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 Grd II DD.     Moderate persistent asthma 04/19/2014   Morbid obesity (HCC)    OSA on CPAP    Could not tolerate CPAP, not surgical candidate per Dr. Benjamine Mola.  Restarted CPAP (auto titrate 5-15 with full facemask--Dr. Alva 2017.  Getting mask fitting/leak issues worked out as of 07/2016 pulm f/u.   Osteoarthritis, multiple sites    primarily knees and back.   Stricture and stenosis of esophagus    Venous insufficiency of both lower extremities     Past Surgical History:  Procedure Laterality Date   CARDIOVASCULAR STRESS TEST  2006; 04/27/10; 10/2017   Normal stress nuclear study, normal EF.  2019: EF 40%, inferior ischemia--intermediate risk study-->Dr. Einar Gip to do cath.   COLONOSCOPY  x 3   Most recent-->12/19/17 (after + cologuard) multiple adenomatous polyps-->recall 3 yrs.   LEFT HEART CATH AND CORONARY ANGIOGRAPHY N/A 01/06/2018   One area of 80% ostial stenosis--small and hard to visualize. No intervention.  DAPT for >1 yr recommended.  Procedure: LEFT HEART CATH AND CORONARY ANGIOGRAPHY;  Surgeon: Adrian Prows, MD;  Location: [facility];  Service: Cardiovascular;  Laterality: N/A;   NASAL SINUS SURGERY     Sleep study  07/2016   OSA; CPAP trial at 10 cm H20 recommended by pulm (Dr. Tennis Must Dios--[place] pulm).   [place]   ?  Discectomy   TEE WITHOUT CARDIOVERSION  05/2010   Dr. Einar Gip; severe LVH, no valvular abnormalities   TRANSESOPHAGEAL ECHOCARDIOGRAM  06/06/10; 12/2011; 09/2014; 11/2016   LVOT obst with 135 mm Hg PG. HOCM. No AV obstruction to flow. No mitral regurg..  Repeat echo 01/09/12 no change.  2016: EF 60%, severe conc LV hypertrophy, normal global wall motion, LA mildly dilated, systolic anterior motion of mitral valve chord, mild MR, mild TR, mild pulm HTN--no signif change since 2013.  Repeat echo 11/2016 and 10/2017 no signif change compared to prior studies.   TRANSTHORACIC ECHOCARDIOGRAM  05/28/2010; 10/2017   2012; Normal LVEF, LVOT obstruction, lateral wall hypokinesis.  10/2017: EF 55%, hypertrophic CM, grd II dd, normal global wall motion.   Family History  Problem Relation Age of Onset   Diabetes Mother    Prostate cancer Father        Prostate   Breast cancer Sister    Diabetes Maternal Aunt    Breast cancer Maternal Aunt    Hyperlipidemia Sister    Hypertension Neg Hx    Coronary artery disease Neg Hx    Colon cancer Neg Hx    Stomach cancer Neg Hx    Rectal cancer Neg Hx    Esophageal cancer  Neg Hx    Social History   Socioeconomic History   Marital status: Married    Spouse name: Not on file   Number of children: 1   Years of education: Not on file   Highest education level: Not on file  Occupational History   Occupation: retired    Fish farm manager: RETIRED  Tobacco Use   Smoking status: Former    Packs/day: 0.50    Years: 40.00    Pack years: 20.00    Types: Cigarettes, Cigars    Quit date: 04/29/1998    Years since quitting: 23.0   Smokeless tobacco: Never   Tobacco comments:    4 cigars  Vaping Use   Vaping Use: Never used  Substance and Sexual Activity   Alcohol use: Yes    Comment: occasionally   Drug use: No   Sexual activity: Not on file  Other Topics Concern   Not on file  Social History Narrative   Married, 1 son, 1 granddaughter.   Retired from [employer] 2000.   Hobbies: trading farm equipment.  Used to be a Environmental manager.   Tobacco x many years, quit @ 2005   No ETOH or drugs.   No regular exercise.Smoking Status:  quit            Social Determinants of Health   Financial Resource Strain: Low Risk    Difficulty of Paying Living Expenses: Not hard at all  Food Insecurity: No Food Insecurity   Worried About Charity fundraiser in the Last Year: Never true   [place] in the Last Year: Never true  Transportation Needs: No Transportation Needs   Lack of Transportation (Medical): No   Lack of Transportation (Non-Medical): No  Physical Activity: Insufficiently Active   Days of Exercise per Week: 7 days   Minutes of Exercise per Session: 20 min  Stress: No Stress Concern Present   Feeling of Stress : Not at all  Social Connections: Socially Integrated   Frequency of Communication with Friends and Family: More than three times a week   Frequency of Social Gatherings with Friends and Family: More than three times a week   Attends Religious Services: More  than 4 times per year   Active Member of Clubs or  Organizations: Yes   Attends Archivist Meetings: 1 to 4 times per year   Marital Status: Married    Tobacco Counseling Counseling given: Not Answered Tobacco comments: 4 cigars   Clinical Intake:  Pre-visit preparation completed: Yes  Pain : No/denies pain     BMI - recorded: 38.9 Nutritional Status: BMI > 30  Obese Nutritional Risks: None Diabetes: Yes CBG done?: No Did pt. bring in CBG monitor from home?: No  How often do you need to have someone help you when you read instructions, pamphlets, or other written materials from your doctor or pharmacy?: 1 - Never  Diabetic?Nutrition Risk Assessment:  Has the patient had any N/V/D within the last 2 months?  No  Does the patient have any non-healing wounds?  No  Has the patient had any unintentional weight loss or weight gain?  No   Diabetes:  Is the patient diabetic?  Yes  If diabetic, was a CBG obtained today?  No  Did the patient bring in their glucometer from home?  No  How often do you monitor your CBG's? N/A.   Financial Strains and Diabetes Management:  Are you having any financial strains with the device, your supplies or your medication? No .  Does the patient want to be seen by Chronic Care Management for management of their diabetes?  No  Would the patient like to be referred to a Nutritionist or for Diabetic Management?  No   Diabetic Exams:  Diabetic Eye Exam: Overdue for diabetic eye exam. Pt has been advised about the importance in completing this exam. Patient advised to call and schedule an eye exam. Diabetic Foot Exam: Completed 08/07/20   Interpreter Needed?: No  Information entered by :: Charlott Rakes, LPN   Activities of Daily Living In your present state of health, do you have any difficulty performing the following activities: 05/02/2021  Hearing? Y  Comment HOH  Vision? N  Difficulty concentrating or making decisions? N  Walking or climbing stairs? N  Dressing or bathing? N   Doing errands, shopping? N  Preparing Food and eating ? N  Using the Toilet? N  In the past six months, have you accidently leaked urine? N  Do you have problems with loss of bowel control? N  Managing your Medications? N  Managing your Finances? N  Housekeeping or managing your Housekeeping? N  Some recent data might be hidden    Patient Care Team: Tammi Sou, MD as PCP - General Adrian Prows, MD as Consulting Physician (Cardiology) Chesley Mires, MD as Consulting Physician (Pulmonary Disease) Izora Gala, MD as Consulting Physician (Otolaryngology) Rutherford Guys, MD as Consulting Physician (Ophthalmology) Inocencio Homes, DPM as Consulting Physician (Podiatry) Rigoberto Noel, MD as Consulting Physician (Pulmonary Disease) [place], Mike Gip, MD as Consulting Physician (Pulmonary Disease) Gatha Mayer, MD as Consulting Physician (Gastroenterology) Darreld Mclean, MD as Referring Physician (Dermatology) Lavonna Monarch, MD as Consulting Physician (Dermatology)  Indicate any recent Medical Services you may have received from other than [facility] providers in the past year (date may be approximate).     Assessment:   This is a routine wellness examination for Jack [name].  Hearing/Vision screen Hearing Screening - Comments:: Pt stated HOH  Vision Screening - Comments:: Pt follows up with Dr Gershon Crane for annual eye exams   Dietary issues and exercise activities discussed: Current Exercise Habits: The patient does not participate in  regular exercise at present   Goals Addressed             This Visit's Progress    Patient Stated       Get off some medications        Depression Screen PHQ 2/9 Scores 05/02/2021 02/16/2021 03/15/2020 03/05/2019 03/06/2018 02/28/2017 11/28/2015  PHQ - 2 Score 0 0 0 0 0 0 0    Fall Risk Fall Risk  05/02/2021 02/16/2021 03/15/2020 03/05/2019 03/06/2018  Falls in the past year? 0 1 0 1 1  Comment - - - - Jumped off tractor  Number falls in past  yr: 0 0 0 0 0  Injury with Fall? 0 0 0 1 0  Follow up Falls prevention discussed Falls evaluation completed Falls prevention discussed Falls evaluation completed -    FALL RISK PREVENTION PERTAINING TO THE HOME:  Any stairs in or around the home? No  If so, are there any without handrails? No  Home free of loose throw rugs in walkways, pet beds, electrical cords, etc? Yes  Adequate lighting in your home to reduce risk of falls? Yes   ASSISTIVE DEVICES UTILIZED TO PREVENT FALLS:  Life alert? No  Use of a cane, walker or w/c? No  Grab bars in the bathroom? No  Shower chair or bench in shower? No  Elevated toilet seat or a handicapped toilet? No   TIMED UP AND GO:  Was the test performed? No .   Cognitive Function: MMSE - Mini Mental State Exam 03/06/2018  Orientation to time 5  Orientation to Place 5  Registration 3  Attention/ Calculation 5  Recall 2  Language- name 2 objects 2  Language- repeat 1  Language- follow 3 step command 3  Language- read & follow direction 1  Write a sentence 1  Copy design 1  Total score 29     6CIT Screen 05/02/2021  What Year? 0 points  What month? 0 points  What time? 0 points  Count back from 20 0 points  Months in reverse 4 points  Repeat phrase 2 points  Total Score 6    Immunizations Immunization History  Administered Date(s) Administered   Fluad Quad(high Dose 65+) 01/25/2020, 01/26/2021   Hepatitis B 06/08/2012, 07/10/2012   Hepatitis B, adult 11/30/2012   Influenza Split 02/26/2011, 01/16/2012   Influenza Whole 12/28/2009   Influenza, High Dose Seasonal PF 01/24/2016, 12/29/2016, 12/19/2018   Influenza,inj,Quad PF,6+ Mos 02/05/2013, 02/02/2014, 01/24/2016   Influenza-Unspecified 02/04/2015, 01/16/2018, 01/04/2020   Moderna SARS-COV2 Booster Vaccination 01/26/2021   Moderna Sars-Covid-2 Vaccination 05/20/2019, 06/25/2019, 02/25/2020   Pneumococcal Conjugate-13 08/03/2013   Pneumococcal Polysaccharide-23 06/06/2011,  01/21/2012, 02/28/2017   Td 04/30/2007   Tdap 03/12/2018   Zoster Recombinat (Shingrix) 03/12/2018, 05/12/2018   Zoster, Live 12/03/2011    TDAP status: Up to date  Flu Vaccine status: Up to date  Pneumococcal vaccine status: Up to date  Covid-19 vaccine status: Completed vaccines  Qualifies for Shingles Vaccine? Yes   Zostavax completed Yes   Shingrix Completed?: Yes  Screening Tests Health Maintenance  Topic Date Due   OPHTHALMOLOGY EXAM  09/20/2020   COLONOSCOPY (Pts 45-19yrs Insurance coverage will need to be confirmed)  12/19/2020   COVID-19 Vaccine (4 - Booster for Moderna series) 03/23/2021   FOOT EXAM  08/07/2021   HEMOGLOBIN A1C  08/17/2021   TETANUS/TDAP  03/12/2028   Pneumonia Vaccine 83+ Years old  Completed   INFLUENZA VACCINE  Completed   Hepatitis C Screening  Completed  Zoster Vaccines- Shingrix  Completed   HPV VACCINES  Aged Out    Health Maintenance  Health Maintenance Due  Topic Date Due   OPHTHALMOLOGY EXAM  09/20/2020   COLONOSCOPY (Pts 45-65yrs Insurance coverage will need to be confirmed)  12/19/2020   COVID-19 Vaccine (4 - Booster for Moderna series) 03/23/2021    Colorectal cancer screening: Referral to GI placed 05/02/21. Pt aware the office will call re: appt.  Additional Screening:  Hepatitis C Screening: Completed 12/25/12  Vision Screening: Recommended annual ophthalmology exams for early detection of glaucoma and other disorders of the eye. Is the patient up to date with their annual eye exam?  Yes  Who is the provider or what is the name of the office in which the patient attends annual eye exams? Dr Gershon Crane If pt is not established with a provider, would they like to be referred to a provider to establish care? No .   Dental Screening: Recommended annual dental exams for proper oral hygiene  Community Resource Referral / Chronic Care Management: CRR required this visit?  No   CCM required this visit?  No      Plan:      I have personally reviewed and noted the following in the patients chart:   Medical and social history Use of alcohol, tobacco or illicit drugs  Current medications and supplements including opioid prescriptions. Patient is not currently taking opioid prescriptions. Functional ability and status Nutritional status Physical activity Advanced directives List of other physicians Hospitalizations, surgeries, and ER visits in previous 12 months Vitals Screenings to include cognitive, depression, and falls Referrals and appointments  In addition, I have reviewed and discussed with patient certain preventive protocols, quality metrics, and best practice recommendations. A written personalized care plan for preventive services as well as general preventive health recommendations were provided to patient.     Willette Brace, LPN   08/02/8590   Nurse Notes: None

## 2021-05-02 NOTE — Patient Instructions (Addendum)
[name] , Thank you for taking time to come for your Medicare Wellness Visit. I appreciate your ongoing commitment to your health goals. Please review the following plan we discussed and let me know if I can assist you in the future.   Screening recommendations/referrals: Colonoscopy: order placed 05/02/21 Recommended yearly ophthalmology/optometry visit for glaucoma screening and checkup Recommended yearly dental visit for hygiene and checkup  Vaccinations: Influenza vaccine: Done 01/26/21 Pneumococcal vaccine Up to date Tdap vaccine: Done 03/12/18 repeat every 10 years  Shingles vaccine: Done 03/14/18 & 05/12/18   Covid-19: Completed 1/21, 2/26, 02/25/20 & 01/26/21  Advanced directives: Please bring a copy of your health care power of attorney and living will to the office at your convenience.  Conditions/risks identified: none at this time  Next appointment: Follow up in one year for your annual wellness visit.   Preventive Care 12 Years and Older, Male Preventive care refers to lifestyle choices and visits with your health care provider that can promote health and wellness. What does preventive care include? A yearly physical exam. This is also called an annual well check. Dental exams once or twice a year. Routine eye exams. Ask your health care provider how often you should have your eyes checked. Personal lifestyle choices, including: Daily care of your teeth and gums. Regular physical activity. Eating a healthy diet. Avoiding tobacco and drug use. Limiting alcohol use. Practicing safe sex. Taking low doses of aspirin every day. Taking vitamin and mineral supplements as recommended by your health care provider. What happens during an annual well check? The services and screenings done by your health care provider during your annual well check will depend on your age, overall health, lifestyle risk factors, and family history of disease. Counseling  Your health care provider  may ask you questions about your: Alcohol use. Tobacco use. Drug use. Emotional well-being. Home and relationship well-being. Sexual activity. Eating habits. History of falls. Memory and ability to understand (cognition). Work and work Statistician. Screening  You may have the following tests or measurements: Height, weight, and BMI. Blood pressure. Lipid and cholesterol levels. These may be checked every 5 years, or more frequently if you are over 20 years old. Skin check. Lung cancer screening. You may have this screening every year starting at age 71 if you have a 30-pack-year history of smoking and currently smoke or have quit within the past 15 years. Fecal occult blood test (FOBT) of the stool. You may have this test every year starting at age 87. Flexible sigmoidoscopy or colonoscopy. You may have a sigmoidoscopy every 5 years or a colonoscopy every 10 years starting at age 28. Prostate cancer screening. Recommendations will vary depending on your family history and other risks. Hepatitis C blood test. Hepatitis B blood test. Sexually transmitted disease (STD) testing. Diabetes screening. This is done by checking your blood sugar (glucose) after you have not eaten for a while (fasting). You may have this done every 1-3 years. Abdominal aortic aneurysm (AAA) screening. You may need this if you are a current or former smoker. Osteoporosis. You may be screened starting at age 34 if you are at high risk. Talk with your health care provider about your test results, treatment options, and if necessary, the need for more tests. Vaccines  Your health care provider may recommend certain vaccines, such as: Influenza vaccine. This is recommended every year. Tetanus, diphtheria, and acellular pertussis (Tdap, Td) vaccine. You may need a Td booster every 10 years. Zoster vaccine. You  may need this after age 34. Pneumococcal 13-valent conjugate (PCV13) vaccine. One dose is recommended after  age 27. Pneumococcal polysaccharide (PPSV23) vaccine. One dose is recommended after age 11. Talk to your health care provider about which screenings and vaccines you need and how often you need them. This information is not intended to replace advice given to you by your health care provider. Make sure you discuss any questions you have with your health care provider. Document Released: 05/12/2015 Document Revised: 01/03/2016 Document Reviewed: 02/14/2015 Elsevier Interactive Patient Education  2017 [employer] Prevention in the Home Falls can cause injuries. They can happen to people of all ages. There are many things you can do to make your home safe and to help prevent falls. What can I do on the outside of my home? Regularly fix the edges of walkways and driveways and fix any cracks. Remove anything that might make you trip as you walk through a door, such as a raised step or threshold. Trim any bushes or trees on the path to your home. Use bright outdoor lighting. Clear any walking paths of anything that might make someone trip, such as rocks or tools. Regularly check to see if handrails are loose or broken. Make sure that both sides of any steps have handrails. Any raised decks and porches should have guardrails on the edges. Have any leaves, snow, or ice cleared regularly. Use sand or salt on walking paths during winter. Clean up any spills in your garage right away. This includes oil or grease spills. What can I do in the bathroom? Use night lights. Install grab bars by the toilet and in the tub and shower. Do not use towel bars as grab bars. Use non-skid mats or decals in the tub or shower. If you need to sit down in the shower, use a plastic, non-slip stool. Keep the floor dry. Clean up any water that spills on the floor as soon as it happens. Remove soap buildup in the tub or shower regularly. Attach bath mats securely with double-sided non-slip rug tape. Do not have  throw rugs and other things on the floor that can make you trip. What can I do in the bedroom? Use night lights. Make sure that you have a light by your bed that is easy to reach. Do not use any sheets or blankets that are too big for your bed. They should not hang down onto the floor. Have a firm chair that has side arms. You can use this for support while you get dressed. Do not have throw rugs and other things on the floor that can make you trip. What can I do in the kitchen? Clean up any spills right away. Avoid walking on wet floors. Keep items that you use a lot in easy-to-reach places. If you need to reach something above you, use a strong step stool that has a grab bar. Keep electrical cords out of the way. Do not use floor polish or wax that makes floors slippery. If you must use wax, use non-skid floor wax. Do not have throw rugs and other things on the floor that can make you trip. What can I do with my stairs? Do not leave any items on the stairs. Make sure that there are handrails on both sides of the stairs and use them. Fix handrails that are broken or loose. Make sure that handrails are as long as the stairways. Check any carpeting to make sure that it is firmly  attached to the stairs. Fix any carpet that is loose or worn. Avoid having throw rugs at the top or bottom of the stairs. If you do have throw rugs, attach them to the floor with carpet tape. Make sure that you have a light switch at the top of the stairs and the bottom of the stairs. If you do not have them, ask someone to add them for you. What else can I do to help prevent falls? Wear shoes that: Do not have high heels. Have rubber bottoms. Are comfortable and fit you well. Are closed at the toe. Do not wear sandals. If you use a stepladder: Make sure that it is fully opened. Do not climb a closed stepladder. Make sure that both sides of the stepladder are locked into place. Ask someone to hold it for you, if  possible. Clearly mark and make sure that you can see: Any grab bars or handrails. First and last steps. Where the edge of each step is. Use tools that help you move around (mobility aids) if they are needed. These include: Canes. Walkers. Scooters. Crutches. Turn on the lights when you go into a dark area. Replace any light bulbs as soon as they burn out. Set up your furniture so you have a clear path. Avoid moving your furniture around. If any of your floors are uneven, fix them. If there are any pets around you, be aware of where they are. Review your medicines with your doctor. Some medicines can make you feel dizzy. This can increase your chance of falling. Ask your doctor what other things that you can do to help prevent falls. This information is not intended to replace advice given to you by your health care provider. Make sure you discuss any questions you have with your health care provider. Document Released: 02/09/2009 Document Revised: 09/21/2015 Document Reviewed: 05/20/2014 Elsevier Interactive Patient Education  2017 [employer].

## 2021-05-03 ENCOUNTER — Other Ambulatory Visit: Payer: Self-pay | Admitting: Cardiology

## 2021-05-04 ENCOUNTER — Other Ambulatory Visit: Payer: Self-pay | Admitting: Cardiology

## 2021-05-04 DIAGNOSIS — I10 Essential (primary) hypertension: Secondary | ICD-10-CM

## 2021-05-04 DIAGNOSIS — I421 Obstructive hypertrophic cardiomyopathy: Secondary | ICD-10-CM

## 2021-06-07 LAB — HEMOGLOBIN A1C: Hemoglobin A1C: 6.2

## 2021-06-26 ENCOUNTER — Telehealth: Payer: Self-pay | Admitting: Family Medicine

## 2021-06-26 NOTE — Telephone Encounter (Signed)
Pt's wife advised pt is due for follow up appt as of January. Offered to schedule appt, due for lab recheck to ensure no med change needed. Pt scheduled for 3/3. He has 2 weeks of meds left

## 2021-06-26 NOTE — Telephone Encounter (Signed)
Pt wife calling about med refill, informed pt wife that we haven't seen pt since last year in November. Pt wife isn't sure which [facility] to send to.   metFORMIN metFORMIN (GLUCOPHAGE) 1000 MG tablet  [facility] ([place], [place] Phone:  [phone]  Fax:  [facility] ([facility] ) - [place], [place] Phone:  [phone]  Fax:  [phone]

## 2021-06-28 ENCOUNTER — Other Ambulatory Visit: Payer: Self-pay

## 2021-06-29 ENCOUNTER — Ambulatory Visit: Payer: Medicare Other | Admitting: Family Medicine

## 2021-07-04 ENCOUNTER — Ambulatory Visit (INDEPENDENT_AMBULATORY_CARE_PROVIDER_SITE_OTHER): Payer: Medicare Other | Admitting: Family Medicine

## 2021-07-04 ENCOUNTER — Other Ambulatory Visit: Payer: Self-pay

## 2021-07-04 ENCOUNTER — Encounter: Payer: Self-pay | Admitting: Family Medicine

## 2021-07-04 VITALS — BP 122/66 | HR 58 | Temp 97.8°F | Ht 68.0 in | Wt 254.0 lb

## 2021-07-04 DIAGNOSIS — J4541 Moderate persistent asthma with (acute) exacerbation: Secondary | ICD-10-CM

## 2021-07-04 DIAGNOSIS — J209 Acute bronchitis, unspecified: Secondary | ICD-10-CM | POA: Diagnosis not present

## 2021-07-04 DIAGNOSIS — E7800 Pure hypercholesterolemia, unspecified: Secondary | ICD-10-CM | POA: Diagnosis not present

## 2021-07-04 DIAGNOSIS — E119 Type 2 diabetes mellitus without complications: Secondary | ICD-10-CM

## 2021-07-04 DIAGNOSIS — I10 Essential (primary) hypertension: Secondary | ICD-10-CM | POA: Diagnosis not present

## 2021-07-04 LAB — COMPREHENSIVE METABOLIC PANEL
ALT: 21 U/L (ref 0–53)
AST: 31 U/L (ref 0–37)
Albumin: 4.6 g/dL (ref 3.5–5.2)
Alkaline Phosphatase: 54 U/L (ref 39–117)
BUN: 16 mg/dL (ref 6–23)
CO2: 28 mEq/L (ref 19–32)
Calcium: 10 mg/dL (ref 8.4–10.5)
Chloride: 106 mEq/L (ref 96–112)
Creatinine, Ser: 0.98 mg/dL (ref 0.40–1.50)
GFR: 76.02 mL/min (ref >60.00)
Glucose, Bld: 97 mg/dL (ref 70–99)
Potassium: 4.5 mEq/L (ref 3.5–5.1)
Sodium: 143 mEq/L (ref 135–145)
Total Bilirubin: 1 mg/dL (ref 0.2–1.2)
Total Protein: 6.5 g/dL (ref 6.0–8.3)

## 2021-07-04 LAB — LIPID PANEL
Cholesterol: 103 mg/dL (ref 0–200)
HDL: 37.5 mg/dL — ABNORMAL LOW (ref >39.00)
LDL Cholesterol: 42 mg/dL (ref 0–99)
NonHDL: 65.47
Total CHOL/HDL Ratio: 3
Triglycerides: 116 mg/dL (ref 0.0–149.0)
VLDL: 23.2 mg/dL (ref 0.0–40.0)

## 2021-07-04 LAB — HEMOGLOBIN A1C: Hgb A1c MFr Bld: 6.3 % (ref 4.6–6.5)

## 2021-07-04 MED ORDER — ROSUVASTATIN CALCIUM 20 MG PO TABS: 20.0000 mg | ORAL_TABLET | Freq: Every day | ORAL | 1 refills | Status: DC

## 2021-07-04 MED ORDER — GLIPIZIDE ER 10 MG PO TB24: 10.0000 mg | ORAL_TABLET | Freq: Every day | ORAL | 1 refills | Status: DC

## 2021-07-04 MED ORDER — METFORMIN HCL 1000 MG PO TABS: 1000.0000 mg | ORAL_TABLET | Freq: Two times a day (BID) | ORAL | 1 refills | Status: DC

## 2021-07-04 MED ORDER — ALLOPURINOL 300 MG PO TABS: ORAL_TABLET | ORAL | 1 refills | Status: DC

## 2021-07-04 MED ORDER — EMPAGLIFLOZIN 25 MG PO TABS: 25.0000 mg | ORAL_TABLET | Freq: Every day | ORAL | 1 refills | Status: DC

## 2021-07-04 MED ORDER — FLUTICASONE PROPIONATE 50 MCG/ACT NA SUSP: NASAL | 1 refills | Status: DC

## 2021-07-04 NOTE — Progress Notes (Signed)
OFFICE VISIT  07/04/2021  CC:  Chief Complaint  Patient presents with   Diabetes    Pt is due for eye exam in the next 3 months   Hypertension   Hyperlipidemia    Pt is fasting    HPI:    Patient is a 76 y.o. male who presents for f/u DM 2, HTN, and HLD. A/P as of last visit 3 mo ago: "Hypertension, control improved on 5 mg lisinopril twice a day. Will have him take a lisinopril 10 mg tab daily now in addition to his Lopressor 50 mg twice a day and his chlorthalidone 25 mg a day. Electrolytes and creatinine checked today. Next a1c 2 mo"  INTERIM HX: He is feeling well. home blood pressure monitoring is consistently normal. He does not check his sugar at home.  No problems with medications.  ROS as above, plus--> no fevers, no CP, no SOB, no wheezing, no cough, no dizziness, no HAs, no rashes, no melena/hematochezia.  No polyuria or polydipsia.  No myalgias or arthralgias.  No focal weakness, paresthesias, or tremors.  No acute vision or hearing abnormalities.  No dysuria or unusual/new urinary urgency or frequency.  No recent changes in lower legs. No n/v/d or abd pain.  No palpitations.    Past Medical History:  Diagnosis Date   Allergy    CAD (coronary artery disease)    see PSH section for details   Carotid bruit 05/28/10   Tortuosity of carotids on doppler u/s, no stenosis.   Cataract    Chronic diastolic heart failure (HCC)    Grd II DD   Colon cancer screening    cologuard POSITIVE 09/2017-->colonoscopy 12/19/17; multiple adenomatous polyps, recall 3 yrs.   COPD (chronic obstructive pulmonary disease) (HCC)    Diabetes mellitus    type 2- non insulin-requiring   DOE (dyspnea on exertion)    Cardiology w/u unrevealing except LVOT obstruction: suspect dyspnea due to HOCM, morbid obesity and OSA--stable as of 02/2017 cardiol f/u.   Fatty liver u/s and CT 2009   w/mildly elevated transaminases; u/s 12/2012 reconfirmed dx.  Hep B and C testing negative.   GERD  (gastroesophageal reflux disease)    EDG 04-03-01, + distal esophageal stricture dilation   Gout    Heart murmur    Hiatal hernia    History of iron deficiency anemia 02/2019   Hb 12.9 02/2019->home hemoccults ordered but pt never returned these.  GI referral was done but pt did not return their call/unable to be contacted. Fall 2021 iron low but Hb normal->iron supp brought iron levels up (hemoccults neg x 3).   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 Grd II DD.     Moderate persistent asthma 04/19/2014   Morbid obesity (HCC)    OSA on CPAP    Could not tolerate CPAP, not surgical candidate per Dr. Benjamine Mola.  Restarted CPAP (auto titrate 5-15 with full facemask--Dr. Alva 2017.  Getting mask fitting/leak issues worked out as of 07/2016 pulm f/u.   Osteoarthritis, multiple sites    primarily knees and back.   Stricture and stenosis of esophagus    Venous insufficiency of both lower extremities     Past Surgical History:  Procedure Laterality Date   CARDIOVASCULAR STRESS TEST  2006; 04/27/10; 10/2017   Normal stress nuclear study, normal EF.  2019: EF 40%, inferior ischemia--intermediate risk study-->Dr. Einar Gip to do cath.   COLONOSCOPY  x 3   Most recent-->12/19/17 (after + cologuard) multiple adenomatous polyps-->recall 3 yrs.   LEFT HEART CATH AND CORONARY ANGIOGRAPHY N/A 01/06/2018   One area of 80% ostial stenosis--small and hard to visualize. No intervention.  DAPT for >1 yr recommended.  Procedure: LEFT HEART CATH AND CORONARY ANGIOGRAPHY;  Surgeon: Adrian Prows, MD;  Location: [facility];  Service: Cardiovascular;  Laterality: N/A;   NASAL SINUS SURGERY     Sleep study  07/2016   OSA; CPAP trial at 10 cm  H20 recommended by pulm (Dr. Tennis Must Dios--[place] pulm).   [place]   ? Discectomy   TEE WITHOUT CARDIOVERSION  05/2010   Dr. Einar Gip; severe LVH, no valvular abnormalities   TRANSESOPHAGEAL ECHOCARDIOGRAM  06/06/10; 12/2011; 09/2014; 11/2016   LVOT obst with 135 mm Hg PG. HOCM. No AV obstruction to flow. No mitral regurg..  Repeat echo 01/09/12 no change.  2016: EF 60%, severe conc LV hypertrophy, normal global wall motion, LA mildly dilated, systolic anterior motion of mitral valve chord, mild MR, mild TR, mild pulm HTN--no signif change since 2013.  Repeat echo 11/2016 and 10/2017 no signif change compared to prior studies.   TRANSTHORACIC ECHOCARDIOGRAM  05/28/2010; 10/2017   2012; Normal LVEF, LVOT obstruction, lateral wall hypokinesis.  10/2017: EF 55%, hypertrophic CM, grd II dd, normal global wall motion.    Outpatient Medications Prior to Visit  Medication Sig Dispense Refill   albuterol (PROAIR HFA) 108 (90 Base) MCG/ACT inhaler INHALE 2 PUFFS INTO THE LUNGS EVERY 4 HOURS AS NEEDED FOR WHEEZING 8.5 g 0   allopurinol (ZYLOPRIM) 300 MG tablet TAKE 1 TABLET BY MOUTH  TWICE DAILY 180 tablet 3   aspirin 81 MG tablet Take 81 mg by mouth daily.     Cholecalciferol (VITAMIN D) 2000 UNITS tablet Take 2,000 Units by mouth daily.     diclofenac Sodium (VOLTAREN) 1 % GEL Apply 2 g topically 4 (four) times daily. 100 g 3   docusate sodium (COLACE) 100 MG capsule Take 100 mg by mouth every other day.     empagliflozin (JARDIANCE) 25 MG TABS tablet Take 25 mg by mouth daily before breakfast. 30 tablet 11   FLUAD QUADRIVALENT 0.5 ML injection      fluticasone (FLONASE) 50 MCG/ACT nasal spray USE 2 SPRAYS IN BOTH  NOSTRILS DAILY AS NEEDED  FOR ALLERGIES. 48 g 0   fluticasone (FLOVENT HFA) 110 MCG/ACT inhaler Inhale 2 puffs into the lungs 2 (two) times daily. 1 each 1   glipiZIDE (GLUCOTROL XL) 10 MG 24 hr tablet Take 1 tablet (10 mg total) by mouth daily. 90 tablet 3   glucose blood (ONE TOUCH ULTRA TEST)  test strip Use as instructed to check blood sugar.  DX 250.00 100 each prn   lisinopril (ZESTRIL) 10 MG tablet Take 1 tablet (10 mg total) by mouth daily. 90 tablet 3   metFORMIN (GLUCOPHAGE) 1000 MG tablet TAKE 1 TABLET BY MOUTH  TWICE DAILY WITH MEALS 180 tablet 0   metoprolol tartrate (LOPRESSOR) 50 MG tablet TAKE 1 TABLET BY MOUTH  TWICE DAILY 180 tablet 3   ONE TOUCH LANCETS MISC Use as directed to check blood sugar.  DX 250.00 200 each prn   pantoprazole (PROTONIX) 40 MG tablet Take 1  tablet (40 mg total) by mouth daily. 90 tablet 3   rosuvastatin (CRESTOR) 20 MG tablet Take 1 tablet (20 mg total) by mouth at bedtime. 90 tablet 3   traZODone (DESYREL) 50 MG tablet TAKE 1 TABLET(50 MG) BY MOUTH AT BEDTIME 30 tablet 1   verapamil (CALAN-SR) 120 MG CR tablet TAKE 1 TABLET BY MOUTH IN  THE MORNING 90 tablet 3   No facility-administered medications prior to visit.    No Known Allergies  ROS As per HPI  PE: Vitals with BMI 07/04/2021 03/26/2021 03/20/2021  Height '5\' 8"'$  '5\' 8"'$  '5\' 8"'$   Weight 254 lbs 255 lbs 13 oz 252 lbs 3 oz  BMI 38.63 90.3 00.92  Systolic 330 076 226  Diastolic 66 74 73  Pulse 58 65 58     Physical Exam  Gen: Alert, well appearing.  Patient is oriented to person, place, time, and situation. AFFECT: pleasant, lucid thought and speech. CV: RRR, no 3/6 systolic murmur unchanged, no diastolic murmur, no r/g.   LUNGS: CTA bilat, nonlabored resps, good aeration in all lung fields. EXT: no clubbing or cyanosis.  2+ bilat LL pitting edema.    LABS:  Last CBC Lab Results  Component Value Date   WBC 7.8 02/16/2021   HGB 16.7 02/16/2021   HCT 50.1 02/16/2021   MCV 92.3 02/16/2021   MCH 30.2 06/17/2020   RDW 15.9 (H) 02/16/2021   PLT 136.0 (L) 33/35/4562   Last metabolic panel Lab Results  Component Value Date   GLUCOSE 92 03/20/2021   NA 140 03/20/2021   K 4.5 03/20/2021   CL 104 03/20/2021   CO2 26 03/20/2021   BUN 17 03/20/2021   CREATININE 1.06  03/20/2021   GFRNONAA 80 06/17/2020   CALCIUM 9.8 03/20/2021   PROT 6.7 02/16/2021   ALBUMIN 4.6 02/16/2021   LABGLOB 2.0 06/17/2020   AGRATIO 2.2 06/17/2020   BILITOT 1.3 (H) 02/16/2021   ALKPHOS 58 02/16/2021   AST 31 02/16/2021   ALT 23 02/16/2021   Last lipids Lab Results  Component Value Date   CHOL 102 02/16/2021   HDL 37.60 (L) 02/16/2021   LDLCALC 41 02/16/2021   TRIG 118.0 02/16/2021   CHOLHDL 3 02/16/2021   Last hemoglobin A1c Lab Results  Component Value Date   HGBA1C 6.2 06/07/2021   Last thyroid functions Lab Results  Component Value Date   TSH 2.34 02/16/2021   IMPRESSION AND PLAN:  #1 hypertension, well controlled on lisinopril 10 mg/day, chlorthalidone 25 mg a day, and Lopressor 50 mg twice a day.  Emphasized low sodium diet. Electrolytes and creatinine today.  2.  Diabetes type 2. Historically well controlled.  Continue Jardiance 25 mg a day, metformin 1000 twice daily, and glipizide XL 10/day. Hemoglobin A1c today.  #3 hyperlipidemia, tolerating rosuvastatin 20 mg a day. Lipid panel and hepatic panel today.  An After Visit Summary was printed and given to the patient.  FOLLOW UP: Return in about 3 months (around 10/04/2021) for routine chronic illness f/u. Next cpe 01/2022  Signed:  Crissie Sickles, MD           07/04/2021

## 2021-07-23 DIAGNOSIS — M5136 Other intervertebral disc degeneration, lumbar region: Secondary | ICD-10-CM | POA: Diagnosis not present

## 2021-07-23 DIAGNOSIS — M9903 Segmental and somatic dysfunction of lumbar region: Secondary | ICD-10-CM | POA: Diagnosis not present

## 2021-07-26 DIAGNOSIS — M5136 Other intervertebral disc degeneration, lumbar region: Secondary | ICD-10-CM | POA: Diagnosis not present

## 2021-07-26 DIAGNOSIS — M9903 Segmental and somatic dysfunction of lumbar region: Secondary | ICD-10-CM | POA: Diagnosis not present

## 2021-07-30 DIAGNOSIS — M5136 Other intervertebral disc degeneration, lumbar region: Secondary | ICD-10-CM | POA: Diagnosis not present

## 2021-07-30 DIAGNOSIS — M9903 Segmental and somatic dysfunction of lumbar region: Secondary | ICD-10-CM | POA: Diagnosis not present

## 2021-08-01 DIAGNOSIS — M5136 Other intervertebral disc degeneration, lumbar region: Secondary | ICD-10-CM | POA: Diagnosis not present

## 2021-08-01 DIAGNOSIS — M9903 Segmental and somatic dysfunction of lumbar region: Secondary | ICD-10-CM | POA: Diagnosis not present

## 2021-08-31 DIAGNOSIS — M1711 Unilateral primary osteoarthritis, right knee: Secondary | ICD-10-CM | POA: Diagnosis not present

## 2021-09-11 ENCOUNTER — Other Ambulatory Visit: Payer: Self-pay | Admitting: Family Medicine

## 2021-09-11 DIAGNOSIS — H524 Presbyopia: Secondary | ICD-10-CM | POA: Diagnosis not present

## 2021-09-11 DIAGNOSIS — Z961 Presence of intraocular lens: Secondary | ICD-10-CM | POA: Diagnosis not present

## 2021-09-11 DIAGNOSIS — H04123 Dry eye syndrome of bilateral lacrimal glands: Secondary | ICD-10-CM | POA: Diagnosis not present

## 2021-09-11 DIAGNOSIS — E119 Type 2 diabetes mellitus without complications: Secondary | ICD-10-CM | POA: Diagnosis not present

## 2021-09-11 LAB — HM DIABETES EYE EXAM

## 2021-09-22 ENCOUNTER — Other Ambulatory Visit: Payer: Self-pay | Admitting: Family Medicine

## 2021-10-26 ENCOUNTER — Other Ambulatory Visit: Payer: Self-pay | Admitting: Family Medicine

## 2021-12-06 ENCOUNTER — Ambulatory Visit (INDEPENDENT_AMBULATORY_CARE_PROVIDER_SITE_OTHER): Payer: Medicare Other | Admitting: Family Medicine

## 2021-12-06 ENCOUNTER — Encounter: Payer: Self-pay | Admitting: Family Medicine

## 2021-12-06 VITALS — BP 103/64 | HR 64 | Temp 98.0°F | Ht 68.0 in | Wt 250.8 lb

## 2021-12-06 DIAGNOSIS — R197 Diarrhea, unspecified: Secondary | ICD-10-CM

## 2021-12-06 DIAGNOSIS — R110 Nausea: Secondary | ICD-10-CM

## 2021-12-06 DIAGNOSIS — W57XXXA Bitten or stung by nonvenomous insect and other nonvenomous arthropods, initial encounter: Secondary | ICD-10-CM | POA: Diagnosis not present

## 2021-12-06 DIAGNOSIS — A09 Infectious gastroenteritis and colitis, unspecified: Secondary | ICD-10-CM | POA: Diagnosis not present

## 2021-12-06 DIAGNOSIS — K529 Noninfective gastroenteritis and colitis, unspecified: Secondary | ICD-10-CM | POA: Diagnosis not present

## 2021-12-06 DIAGNOSIS — S30863A Insect bite (nonvenomous) of scrotum and testes, initial encounter: Secondary | ICD-10-CM | POA: Diagnosis not present

## 2021-12-06 DIAGNOSIS — R5081 Fever presenting with conditions classified elsewhere: Secondary | ICD-10-CM | POA: Diagnosis not present

## 2021-12-06 MED ORDER — ONDANSETRON HCL 8 MG PO TABS: 8.0000 mg | ORAL_TABLET | Freq: Three times a day (TID) | ORAL | 0 refills | Status: AC | PRN

## 2021-12-06 NOTE — Patient Instructions (Signed)
Take over the counter imodium twice per day as needed for diarrhea.  I sent a rx for medication to take for nausea (ondansetron).  Take [phone] mg of tylenol every 6 hours as needed for fever or body aches

## 2021-12-06 NOTE — Progress Notes (Signed)
OFFICE VISIT  12/06/2021  CC:  Chief Complaint  Patient presents with   Diarrhea    Using anti-diarrhea pills, last dose was last night. Pt reports he is still going every 2 hours compared to every 45 minutes.   Fever    Pt had fever on Monday (101), wife gave him Aleve then changed to Tylenol extra strength. Last dose was this morning. Wife pulled tick off pt on Sat but there is still redness around bite area. Pt was hot and sweaty Mon then cold yesterday.   Patient is a 76 y.o. male who presents for fever and diarrhea.  HPI: 5 days ago patient had onset of some malaise, upset stomach/nausea, and frequent watery brownish-yellow stool.  No vomiting or blood in stool.  No abdominal pain.  He developed a fever to 101.  He took Advil and this has not recurred.  He found a tick on his scrotum the day before his symptoms started. He has had no rash, no headache, and no joint or muscle aches. He has not eaten very well or hydrating very well. No urinary symptoms.  No nasal congestion/runny nose, no cough.,  No shortness of breath, no wheezing, no chest pain. No known sick contacts, unusual foods, or recent antibiotics.  The day the symptoms started he took a COVID test at home and it was negative.  ROS as above, plus--> no dizziness, no melena/hematochezia.    No focal weakness, paresthesias, or tremors.  No acute vision or hearing abnormalities. No recent changes in lower legs. No palpitations.    Past Medical History:  Diagnosis Date   Allergy    CAD (coronary artery disease)    see PSH section for details   Carotid bruit 05/28/10   Tortuosity of carotids on doppler u/s, no stenosis.   Cataract    Chronic diastolic heart failure (HCC)    Grd II DD   Colon cancer screening    cologuard POSITIVE 09/2017-->colonoscopy 12/19/17; multiple adenomatous polyps, recall 3 yrs.   COPD (chronic obstructive pulmonary disease) (HCC)    Diabetes mellitus    type 2- non insulin-requiring   DOE  (dyspnea on exertion)    Cardiology w/u unrevealing except LVOT obstruction: suspect dyspnea due to HOCM, morbid obesity and OSA--stable as of 02/2017 cardiol f/u.   Fatty liver u/s and CT 2009   w/mildly elevated transaminases; u/s 12/2012 reconfirmed dx.  Hep B and C testing negative.   GERD (gastroesophageal reflux disease)    EDG 04-03-01, + distal esophageal stricture dilation   Gout    Heart murmur    Hiatal hernia    History of iron deficiency anemia 02/2019   Hb 12.9 02/2019->home hemoccults ordered but pt never returned these.  GI referral was done but pt did not return their call/unable to be contacted. Fall 2021 iron low but Hb normal->iron supp brought iron levels up (hemoccults neg x 3).   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 Grd II DD.     Moderate persistent asthma 04/19/2014   Morbid obesity (HCC)    OSA on CPAP    Could not tolerate CPAP, not surgical candidate per Dr. Benjamine Mola.  Restarted CPAP (auto titrate 5-15 with full  facemask--Dr. Elsworth Soho 2017.  Getting mask fitting/leak issues worked out as of 07/2016 pulm f/u.   Osteoarthritis, multiple sites    primarily knees and back.   Stricture and stenosis of esophagus    Venous insufficiency of both lower extremities     Past Surgical History:  Procedure Laterality Date   CARDIOVASCULAR STRESS TEST  2006; 04/27/10; 10/2017   Normal stress nuclear study, normal EF.  2019: EF 40%, inferior ischemia--intermediate risk study-->Dr. Einar Gip to do cath.   COLONOSCOPY  x 3   Most recent-->12/19/17 (after + cologuard) multiple adenomatous polyps-->recall 3 yrs.   LEFT HEART CATH AND CORONARY ANGIOGRAPHY N/A 01/06/2018   One area of 80% ostial stenosis--small  and hard to visualize. No intervention.  DAPT for >1 yr recommended.  Procedure: LEFT HEART CATH AND CORONARY ANGIOGRAPHY;  Surgeon: Adrian Prows, MD;  Location: [facility];  Service: Cardiovascular;  Laterality: N/A;   NASAL SINUS SURGERY     Sleep study  07/2016   OSA; CPAP trial at 10 cm H20 recommended by pulm (Dr. Tennis Must Dios--[facility]).   [place]   ? Discectomy   TEE WITHOUT CARDIOVERSION  05/2010   Dr. Einar Gip; severe LVH, no valvular abnormalities   TRANSESOPHAGEAL ECHOCARDIOGRAM  06/06/10; 12/2011; 09/2014; 11/2016   LVOT obst with 135 mm Hg PG. HOCM. No AV obstruction to flow. No mitral regurg..  Repeat echo 01/09/12 no change.  2016: EF 60%, severe conc LV hypertrophy, normal global wall motion, LA mildly dilated, systolic anterior motion of mitral valve chord, mild MR, mild TR, mild pulm HTN--no signif change since 2013.  Repeat echo 11/2016 and 10/2017 no signif change compared to prior studies.   TRANSTHORACIC ECHOCARDIOGRAM  05/28/2010; 10/2017   2012; Normal LVEF, LVOT obstruction, lateral wall hypokinesis.  10/2017: EF 55%, hypertrophic CM, grd II dd, normal global wall motion.    Outpatient Medications Prior to Visit  Medication Sig Dispense Refill   allopurinol (ZYLOPRIM) 300 MG tablet TAKE 1 TABLET BY MOUTH  TWICE DAILY 180 tablet 1   aspirin 81 MG tablet Take 81 mg by mouth daily.     Cholecalciferol (VITAMIN D) 2000 UNITS tablet Take 2,000 Units by mouth daily.     diclofenac Sodium (VOLTAREN) 1 % GEL Apply 2 g topically 4 (four) times daily. 100 g 3   docusate sodium (COLACE) 100 MG capsule Take 100 mg by mouth every other day.     FLUAD QUADRIVALENT 0.5 ML injection      fluticasone (FLONASE) 50 MCG/ACT nasal spray USE 2 SPRAYS IN BOTH NOSTRILS  DAILY AS NEEDED FOR ALLERGIES 64 g 1   fluticasone (FLOVENT HFA) 110 MCG/ACT inhaler Inhale 2 puffs into the lungs 2 (two) times daily. 1 each 1   glipiZIDE (GLUCOTROL XL) 10 MG 24 hr tablet Take 1 tablet (10 mg total) by  mouth daily. 90 tablet 1   glucose blood (ONE TOUCH ULTRA TEST) test strip Use as instructed to check blood sugar.  DX 250.00 100 each prn   JARDIANCE 25 MG TABS tablet TAKE 1 TABLET BY MOUTH DAILY  BEFORE BREAKFAST 100 tablet 2   lisinopril (ZESTRIL) 10 MG tablet Take 1 tablet (10 mg total) by mouth daily. 90 tablet 3   metFORMIN (GLUCOPHAGE) 1000 MG tablet TAKE 1 TABLET BY MOUTH TWICE  DAILY WITH MEALS 200 tablet 2   metoprolol tartrate (LOPRESSOR) 50 MG tablet TAKE 1 TABLET BY MOUTH  TWICE DAILY 180 tablet 3   ONE TOUCH  LANCETS MISC Use as directed to check blood sugar.  DX 250.00 200 each prn   pantoprazole (PROTONIX) 40 MG tablet TAKE 1 TABLET BY MOUTH  DAILY 90 tablet 1   rosuvastatin (CRESTOR) 20 MG tablet Take 1 tablet (20 mg total) by mouth at bedtime. 90 tablet 1   traZODone (DESYREL) 50 MG tablet TAKE 1 TABLET(50 MG) BY MOUTH AT BEDTIME 30 tablet 1   verapamil (CALAN-SR) 120 MG CR tablet TAKE 1 TABLET BY MOUTH IN  THE MORNING 90 tablet 3   albuterol (PROAIR HFA) 108 (90 Base) MCG/ACT inhaler INHALE 2 PUFFS INTO THE LUNGS EVERY 4 HOURS AS NEEDED FOR WHEEZING (Patient not taking: Reported on 12/06/2021) 8.5 g 0   No facility-administered medications prior to visit.    No Known Allergies  ROS As per HPI  PE:    12/06/2021    2:04 PM 07/04/2021    8:19 AM 03/26/2021    2:55 PM  Vitals with BMI  Height $Remov'5\' 8"'lHGosY$  $Remove'5\' 8"'EnNLnOU$  $RemoveB'5\' 8"'JDUifeBR$   Weight 250 lbs 13 oz 254 lbs 255 lbs 13 oz  BMI 38.14 63.14 97.0  Systolic 263 785 885  Diastolic 64 66 74  Pulse 64 58 65     Physical Exam  Gen: Alert, well appearing.  Patient is oriented to person, place, time, and situation. AFFECT: pleasant, lucid thought and speech. Scrotum: no papules, rash, nodules, or skin breakdown. No tick.  LABS:  Last CBC Lab Results  Component Value Date   WBC 7.8 02/16/2021   HGB 16.7 02/16/2021   HCT 50.1 02/16/2021   MCV 92.3 02/16/2021   MCH 30.2 06/17/2020   RDW 15.9 (H) 02/16/2021   PLT 136.0 (L) 02/77/4128    Last metabolic panel Lab Results  Component Value Date   GLUCOSE 97 07/04/2021   NA 143 07/04/2021   K 4.5 07/04/2021   CL 106 07/04/2021   CO2 28 07/04/2021   BUN 16 07/04/2021   CREATININE 0.98 07/04/2021   GFRNONAA 80 06/17/2020   CALCIUM 10.0 07/04/2021   PROT 6.5 07/04/2021   ALBUMIN 4.6 07/04/2021   LABGLOB 2.0 06/17/2020   AGRATIO 2.2 06/17/2020   BILITOT 1.0 07/04/2021   ALKPHOS 54 07/04/2021   AST 31 07/04/2021   ALT 21 07/04/2021   Lab Results  Component Value Date   HGBA1C 6.3 07/04/2021   RAPID COVID TEST TODAY IN OFFICE: NEGATIVE  IMPRESSION AND PLAN:  #1 gastroenteritis, suspect viral. There is the complication of this tick bite but his symptoms do not match a tickborne illness and he did not have a sufficient incubation period. Due to the length of the illness so far and the return of his diarrhea today we will go ahead and check CBC and c-Met.   I think he is mildly dehydrated. Encouraged oral rehydration.  Prescribed Zofran 8 mg to take every 8 hours as needed for nausea. Recommended over-the-counter Imodium up to twice a day as needed for diarrhea.  An After Visit Summary was printed and given to the patient.  FOLLOW UP: Return if symptoms worsen or fail to improve.  Signed:  Crissie Sickles, MD           12/06/2021

## 2021-12-07 ENCOUNTER — Telehealth: Payer: Self-pay

## 2021-12-07 DIAGNOSIS — A09 Infectious gastroenteritis and colitis, unspecified: Secondary | ICD-10-CM

## 2021-12-07 LAB — CBC WITH DIFFERENTIAL/PLATELET
Basophils Absolute: 0.1 10*3/uL (ref 0.0–0.1)
Basophils Relative: 0.9 % (ref 0.0–3.0)
Eosinophils Absolute: 0.1 10*3/uL (ref 0.0–0.7)
Eosinophils Relative: 1.2 % (ref 0.0–5.0)
HCT: 54.6 % — ABNORMAL HIGH (ref 39.0–52.0)
Hemoglobin: 18 g/dL (ref 13.0–17.0)
Lymphocytes Relative: 19.6 % (ref 12.0–46.0)
Lymphs Abs: 1.4 10*3/uL (ref 0.7–4.0)
MCHC: 33 g/dL (ref 30.0–36.0)
MCV: 93.2 fl (ref 78.0–100.0)
Monocytes Absolute: 1 10*3/uL (ref 0.1–1.0)
Monocytes Relative: 14 % — ABNORMAL HIGH (ref 3.0–12.0)
Neutro Abs: 4.7 10*3/uL (ref 1.4–7.7)
Neutrophils Relative %: 64.3 % (ref 43.0–77.0)
Platelets: 120 10*3/uL — ABNORMAL LOW (ref 150.0–400.0)
RBC: 5.86 Mil/uL — ABNORMAL HIGH (ref 4.22–5.81)
RDW: 15.7 % — ABNORMAL HIGH (ref 11.5–15.5)
WBC: 7.4 10*3/uL (ref 4.0–10.5)

## 2021-12-07 LAB — COMPREHENSIVE METABOLIC PANEL
ALT: 21 U/L (ref 0–53)
AST: 27 U/L (ref 0–37)
Albumin: 4.4 g/dL (ref 3.5–5.2)
Alkaline Phosphatase: 65 U/L (ref 39–117)
BUN: 19 mg/dL (ref 6–23)
CO2: 25 mEq/L (ref 19–32)
Calcium: 9.2 mg/dL (ref 8.4–10.5)
Chloride: 102 mEq/L (ref 96–112)
Creatinine, Ser: 1.17 mg/dL (ref 0.40–1.50)
GFR: 61.27 mL/min (ref >60.00)
Glucose, Bld: 135 mg/dL — ABNORMAL HIGH (ref 70–99)
Potassium: 4.1 mEq/L (ref 3.5–5.1)
Sodium: 136 mEq/L (ref 135–145)
Total Bilirubin: 1.3 mg/dL — ABNORMAL HIGH (ref 0.2–1.2)
Total Protein: 6.6 g/dL (ref 6.0–8.3)

## 2021-12-07 NOTE — Telephone Encounter (Signed)
Critical lab of hemoglobin 18.0 and hematocrit 54.60 reported by Jeani Hawking from [facility].

## 2021-12-08 ENCOUNTER — Encounter: Payer: Self-pay | Admitting: Family Medicine

## 2021-12-10 MED ORDER — DIPHENOXYLATE-ATROPINE 2.5-0.025 MG PO TABS: ORAL_TABLET | ORAL | 0 refills | Status: DC

## 2021-12-10 NOTE — Addendum Note (Signed)
Addended by: Deveron Furlong D on: 12/10/2021 02:06 PM   Modules accepted: Orders

## 2021-12-10 NOTE — Telephone Encounter (Signed)
Spoke with pt regarding results/recommendations,voiced understanding. He will come by this week to pick up for completion.

## 2021-12-10 NOTE — Telephone Encounter (Signed)
Please review and advise.

## 2021-12-10 NOTE — Telephone Encounter (Addendum)
Provider was waiting for all labs to return prior to calling. All labs have resulted.  Please review and advise

## 2021-12-10 NOTE — Addendum Note (Signed)
Addended by: Tammi Sou on: 12/10/2021 12:25 PM   Modules accepted: Orders

## 2021-12-10 NOTE — Telephone Encounter (Signed)
Britt, see result note attached to labs. I sent in rx for lomotil.

## 2021-12-10 NOTE — Telephone Encounter (Signed)
All labs normal except hemoglobin elevated.  I suspect this is due to his sleep apnea.  No abnormalities to explain his current illness, though. Pls do stool sample for bacterial culture and c diff pcr.  Dx infectious diarrhea. I have sent in rx for lomotil for him to take for diarrhea but he should not take it more than twice a day.-thx

## 2021-12-25 ENCOUNTER — Telehealth: Payer: Self-pay | Admitting: Family Medicine

## 2021-12-25 DIAGNOSIS — R197 Diarrhea, unspecified: Secondary | ICD-10-CM

## 2021-12-25 MED ORDER — DIPHENOXYLATE-ATROPINE 2.5-0.025 MG PO TABS
ORAL_TABLET | ORAL | 0 refills | Status: DC
Start: 2021-12-25 — End: 2022-03-27

## 2021-12-25 NOTE — Telephone Encounter (Signed)
Per provider: "I will do another prescription for Lomotil.  There is nothing stronger"  LM for pt to return call.

## 2021-12-25 NOTE — Telephone Encounter (Signed)
Pt was last seen 8/10 for diarrhea concerns. Rx given for diarrhea #20  Please review and advise

## 2021-12-25 NOTE — Telephone Encounter (Deleted)
12/25/21 Pt came to the office to pickup equipment to take home to collect specimen and stated that the medication isn't strong enough and the recent reorder was the same medication as before and is asking if possible can he get a different and stronger medication.

## 2021-12-25 NOTE — Telephone Encounter (Signed)
Pt's wife called and reports that Zak is still experiencing diarrhea at this time. He would like to know if he can have a refill for Diphenoxylate/ atropine or something stronger.

## 2021-12-25 NOTE — Telephone Encounter (Signed)
Pt is having some improvement but having loose stools. He is coming to pick up stool kit today (8/29).

## 2021-12-25 NOTE — Telephone Encounter (Signed)
12/25/21 Pt came to the office to pickup equipment to take home to collect specimen and stated that the medication isn't strong enough and the recent reorder was the same medication as before and is asking if possible can he get a different and stronger medication.

## 2021-12-25 NOTE — Telephone Encounter (Signed)
I will do another prescription for Lomotil.  There is nothing stronger. Would still like him to collect a stool sample for testing. I will make sure tests are ordered.   Is his diarrhea not improving at all?

## 2021-12-26 NOTE — Telephone Encounter (Signed)
Patient's wife advised of recommendations, pt will complete stool kit.

## 2021-12-27 DIAGNOSIS — A09 Infectious gastroenteritis and colitis, unspecified: Secondary | ICD-10-CM | POA: Diagnosis not present

## 2021-12-27 DIAGNOSIS — R197 Diarrhea, unspecified: Secondary | ICD-10-CM | POA: Diagnosis not present

## 2021-12-28 LAB — GIARDIA AND CRYPTOSPORIDIUM ANTIGEN PANEL
MICRO NUMBER:: 13859943
RESULT:: NOT DETECTED
SPECIMEN QUALITY:: ADEQUATE
Specimen Quality:: ADEQUATE
micro Number:: 13859942

## 2021-12-28 LAB — CLOSTRIDIUM DIFFICILE BY PCR: Toxigenic C. Difficile by PCR: NEGATIVE

## 2022-01-01 DIAGNOSIS — A09 Infectious gastroenteritis and colitis, unspecified: Secondary | ICD-10-CM | POA: Diagnosis not present

## 2022-01-01 DIAGNOSIS — R197 Diarrhea, unspecified: Secondary | ICD-10-CM | POA: Diagnosis not present

## 2022-01-03 LAB — STOOL CULTURE: E coli, Shiga toxin Assay: NEGATIVE

## 2022-01-04 ENCOUNTER — Telehealth: Payer: Self-pay

## 2022-01-04 MED ORDER — SULFAMETHOXAZOLE-TRIMETHOPRIM 800-160 MG PO TABS
1.0000 | ORAL_TABLET | Freq: Two times a day (BID) | ORAL | 0 refills | Status: DC
Start: 2022-01-04 — End: 2022-01-10

## 2022-01-04 NOTE — Telephone Encounter (Signed)
-----   Message from Jack Sou, MD sent at 01/04/2022  3:57 PM EDT ----- These notify patient that his stool grew a bacteria called Campylobacter. This can be treated with antibiotic. Please send in Bactrim double strength, 1 twice daily x5 days, #10, no refill.

## 2022-01-05 LAB — GI PROFILE, STOOL, PCR
Adenovirus F 40/41: NOT DETECTED
Astrovirus: NOT DETECTED
C difficile toxin A/B: NOT DETECTED
Campylobacter: DETECTED — AB
Cryptosporidium: NOT DETECTED
Cyclospora cayetanensis: NOT DETECTED
Entamoeba histolytica: NOT DETECTED
Enteroaggregative E coli: NOT DETECTED
Enteropathogenic E coli: NOT DETECTED
Enterotoxigenic E coli: NOT DETECTED
Giardia lamblia: NOT DETECTED
Norovirus GI/GII: NOT DETECTED
Plesiomonas shigelloides: NOT DETECTED
Rotavirus A: NOT DETECTED
Salmonella: NOT DETECTED
Sapovirus: NOT DETECTED
Shiga-toxin-producing E coli: NOT DETECTED
Shigella/Enteroinvasive E coli: NOT DETECTED
Vibrio cholerae: NOT DETECTED
Vibrio: NOT DETECTED
Yersinia enterocolitica: NOT DETECTED

## 2022-01-10 ENCOUNTER — Telehealth: Payer: Self-pay | Admitting: Family Medicine

## 2022-01-10 MED ORDER — SULFAMETHOXAZOLE-TRIMETHOPRIM 800-160 MG PO TABS: 1.0000 | ORAL_TABLET | Freq: Two times a day (BID) | ORAL | 0 refills | Status: DC

## 2022-01-10 NOTE — Telephone Encounter (Signed)
Pt's wife called and wanted to let Dr. Anitra Lauth and Vevelyn Royals know that her husband is still having diarrhea, however it has improved and is not as severe. He would like to know if he will need a refill of the medication that was prescribed? He completed that a couple days ago.

## 2022-01-10 NOTE — Telephone Encounter (Signed)
I informed the patient that he would need to make another appointment with Dr. Anitra Lauth, and he kept replying this is the third follow up for this issue. He declined to get scheduled at this time and said he would call back since his wife is having a procedure Monday and he will need to take care of her. I offered an appointment for tomorrow 9/15 pt declined at time of call, and said he will discuss with wife and call back.

## 2022-01-10 NOTE — Telephone Encounter (Signed)
OK to RF bactrim DS 1 bid x 5d, #10, no RF.

## 2022-01-10 NOTE — Telephone Encounter (Signed)
LM for pt to return call. Patient will need to follow up with provider. Provider has already been made aware verbally and agrees with this plan.

## 2022-01-10 NOTE — Telephone Encounter (Signed)
Patient advised of recommendations. Declined GI referral at this time. He wanted to know if he could get another 5 day course of Bactrim previously prescribed on 9/8.  Please further advise.

## 2022-01-10 NOTE — Telephone Encounter (Signed)
Patient advised rx sent in and call if no improvement after finishing medication.

## 2022-01-11 ENCOUNTER — Other Ambulatory Visit: Payer: Self-pay | Admitting: Family Medicine

## 2022-01-23 ENCOUNTER — Other Ambulatory Visit: Payer: Self-pay | Admitting: Family Medicine

## 2022-01-30 ENCOUNTER — Other Ambulatory Visit: Payer: Self-pay | Admitting: Cardiology

## 2022-01-30 DIAGNOSIS — I421 Obstructive hypertrophic cardiomyopathy: Secondary | ICD-10-CM

## 2022-01-30 DIAGNOSIS — I10 Essential (primary) hypertension: Secondary | ICD-10-CM

## 2022-01-31 DIAGNOSIS — G4733 Obstructive sleep apnea (adult) (pediatric): Secondary | ICD-10-CM | POA: Diagnosis not present

## 2022-03-09 ENCOUNTER — Other Ambulatory Visit: Payer: Self-pay | Admitting: Family Medicine

## 2022-03-12 ENCOUNTER — Ambulatory Visit: Payer: Medicare Other

## 2022-03-12 DIAGNOSIS — I421 Obstructive hypertrophic cardiomyopathy: Secondary | ICD-10-CM

## 2022-03-13 ENCOUNTER — Encounter: Payer: Self-pay | Admitting: Family Medicine

## 2022-03-15 ENCOUNTER — Ambulatory Visit: Payer: Medicare Other | Admitting: Family Medicine

## 2022-03-15 ENCOUNTER — Other Ambulatory Visit: Payer: Self-pay

## 2022-03-15 DIAGNOSIS — R062 Wheezing: Secondary | ICD-10-CM | POA: Diagnosis not present

## 2022-03-15 DIAGNOSIS — J22 Unspecified acute lower respiratory infection: Secondary | ICD-10-CM | POA: Diagnosis not present

## 2022-03-15 DIAGNOSIS — R0981 Nasal congestion: Secondary | ICD-10-CM | POA: Diagnosis not present

## 2022-03-15 DIAGNOSIS — J029 Acute pharyngitis, unspecified: Secondary | ICD-10-CM | POA: Diagnosis not present

## 2022-03-15 MED ORDER — EMPAGLIFLOZIN 25 MG PO TABS: 25.0000 mg | ORAL_TABLET | Freq: Every day | ORAL | 2 refills | Status: DC

## 2022-03-27 ENCOUNTER — Ambulatory Visit: Payer: Medicare Other

## 2022-03-27 VITALS — BP 101/66 | HR 95 | Resp 18 | Ht 68.0 in | Wt 251.4 lb

## 2022-03-27 DIAGNOSIS — I421 Obstructive hypertrophic cardiomyopathy: Secondary | ICD-10-CM

## 2022-03-27 DIAGNOSIS — I10 Essential (primary) hypertension: Secondary | ICD-10-CM

## 2022-03-27 DIAGNOSIS — G4733 Obstructive sleep apnea (adult) (pediatric): Secondary | ICD-10-CM

## 2022-03-27 DIAGNOSIS — E7800 Pure hypercholesterolemia, unspecified: Secondary | ICD-10-CM | POA: Diagnosis not present

## 2022-03-27 DIAGNOSIS — F5101 Primary insomnia: Secondary | ICD-10-CM | POA: Diagnosis not present

## 2022-03-27 DIAGNOSIS — J4541 Moderate persistent asthma with (acute) exacerbation: Secondary | ICD-10-CM | POA: Diagnosis not present

## 2022-03-27 MED ORDER — TRAZODONE HCL 50 MG PO TABS: ORAL_TABLET | ORAL | 1 refills | Status: DC

## 2022-03-27 MED ORDER — ALBUTEROL SULFATE HFA 108 (90 BASE) MCG/ACT IN AERS: INHALATION_SPRAY | RESPIRATORY_TRACT | 0 refills | Status: AC

## 2022-03-27 NOTE — Progress Notes (Signed)
Primary Physician/Referring:  Tammi Sou, MD  Patient ID: Jack [name], male    DOB: 04-10-1946, 76 y.o.   MRN: [id number]  Chief Complaint  Patient presents with   Cardiomyopathy   Follow-up    1 year   HPI:    Jack [name]  is a 76 y.o. 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    He is here in annual visit, states that he is doing well, dyspnea has remained stable, leg edema is also remained stable and very minimal. He recently had bronchitis and was treated with steroid dose pack however, he is still having ongoing wheezing but no difficulty breathing.  He reports occasional compliance with CPAP due to difficulty wearing mask. He does still have ongoing issues with insomnia and sleeps approximately 4 hours per night. He was previously on Trazadone and felt this was helpful with sleep but has not had this medication in several months.  Past Medical History:  Diagnosis Date   Allergy    CAD (coronary artery disease)    see PSH section for details   Carotid bruit 05/28/10   Tortuosity of carotids on doppler u/s, no stenosis.   Cataract    Chronic diastolic heart failure (HCC)    Grd II DD   Colon cancer screening    cologuard POSITIVE 09/2017-->colonoscopy 12/19/17; multiple adenomatous polyps, recall 3 yrs.   COPD (chronic obstructive pulmonary disease) (HCC)    Diabetes mellitus    type 2- non insulin-requiring   DOE (dyspnea on exertion)    Cardiology w/u unrevealing except LVOT obstruction: suspect dyspnea due to HOCM, morbid obesity and OSA--stable as of 02/2017 cardiol f/u.   Fatty liver u/s and CT 2009   w/mildly elevated transaminases; u/s 12/2012 reconfirmed dx.  Hep B and C testing negative.   GERD (gastroesophageal reflux  disease)    EDG 04-03-01, + distal esophageal stricture dilation   Gout    Heart murmur    Hiatal hernia    History of iron deficiency anemia 02/2019   Hb 12.9 02/2019->home hemoccults ordered but pt never returned these.  GI referral was done but pt did not return their call/unable to be contacted. Fall 2021 iron low but Hb normal->iron supp brought iron levels up (hemoccults neg x 3).   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 Grd II DD.     Moderate persistent asthma 04/19/2014   Morbid obesity (HCC)    OSA on CPAP    Could not tolerate CPAP, not surgical candidate per Dr. Benjamine Mola.  Restarted CPAP (auto titrate 5-15 with full facemask--Dr. Alva 2017.  Getting mask fitting/leak issues worked out as of 07/2016 pulm f/u.   Osteoarthritis, multiple sites    primarily knees and back.   Stricture and stenosis of esophagus    Venous insufficiency of both lower extremities    Past Surgical History:  Procedure Laterality Date   CARDIOVASCULAR STRESS TEST  2006; 04/27/10; 10/2017   Normal stress nuclear study, normal  EF.  2019: EF 40%, inferior ischemia--intermediate risk study-->Dr. Einar Gip to do cath.   COLONOSCOPY  x 3   Most recent-->12/19/17 (after + cologuard) multiple adenomatous polyps-->recall 3 yrs.   LEFT HEART CATH AND CORONARY ANGIOGRAPHY N/A 01/06/2018   One area of 80% ostial stenosis--small and hard to visualize. No intervention.  DAPT for >1 yr recommended.  Procedure: LEFT HEART CATH AND CORONARY ANGIOGRAPHY;  Surgeon: Adrian Prows, MD;  Location: [facility];  Service: Cardiovascular;  Laterality: N/A;   NASAL SINUS SURGERY     Sleep study  07/2016   OSA; CPAP trial at 10 cm H20 recommended by pulm  (Dr. Tennis Must Dios--[place] pulm).   Jack [name]   ? Discectomy   TEE WITHOUT CARDIOVERSION  05/2010   Dr. Einar Gip; severe LVH, no valvular abnormalities   TRANSESOPHAGEAL ECHOCARDIOGRAM  06/06/10; 12/2011; 09/2014; 11/2016   LVOT obst with 135 mm Hg PG. HOCM. No AV obstruction to flow. No mitral regurg..  Repeat echo 01/09/12 no change.  2016: EF 60%, severe conc LV hypertrophy, normal global wall motion, LA mildly dilated, systolic anterior motion of mitral valve chord, mild MR, mild TR, mild pulm HTN--no signif change since 2013.  Repeat echo 11/2016 and 10/2017 no signif change compared to prior studies.   TRANSTHORACIC ECHOCARDIOGRAM  05/28/2010; 10/2017   2012; Normal LVEF, LVOT obst, lateral wall hypokin.  10/2017: EF 55%, hypertrophic CM, grd II dd, normal global wall motion. 02/2022 no change except now with severe LA dilation   Social History   Tobacco Use   Smoking status: Former    Packs/day: 0.50    Years: 40.00    Total pack years: 20.00    Types: Cigarettes, Cigars    Quit date: 04/29/1998    Years since quitting: 23.9   Smokeless tobacco: Never   Tobacco comments:    4 cigars  Substance Use Topics   Alcohol use: Yes    Comment: occasionally    ROS  Review of Systems  Cardiovascular:  Positive for dyspnea on exertion (stable) and leg swelling (stable. minimal). Negative for chest pain and syncope.  Respiratory:  Positive for snoring and wheezing.   Gastrointestinal:  Negative for melena.   Objective      03/27/2022    8:48 AM 12/06/2021    2:04 PM 07/04/2021    8:19 AM  Vitals with BMI  Height '5\' 8"'$  '5\' 8"'$  '5\' 8"'$   Weight 251 lbs 6 oz 250 lbs 13 oz 254 lbs  BMI 38.23 78.24 23.53  Systolic 614 431 540  Diastolic 66 64 66  Pulse 95 64 58    Physical Exam Constitutional:      Appearance: He is morbidly obese.  Neck:     Thyroid: No thyromegaly.     Vascular: Carotid bruit (bilateral) present.  Cardiovascular:     Rate and Rhythm: Normal rate and regular rhythm.      Pulses: Intact distal pulses.          Carotid pulses are 2+ on the right side with bruit and 2+ on the left side with bruit.      Radial pulses are 2+ on the right side and 2+ on the left side.       Dorsalis pedis pulses are 2+ on the right side and 2+ on the left side.     Heart sounds: Murmur heard.     Harsh midsystolic murmur is present at the upper right sternal border radiating to the  neck.     No gallop.  Pulmonary:     Effort: Pulmonary effort is normal.     Breath sounds: Normal breath sounds.  Abdominal:     General: Bowel sounds are normal.     Palpations: Abdomen is soft.  Musculoskeletal:     Cervical back: Neck supple.     Right lower leg: No edema.     Left lower leg: No edema.    Laboratory examination:   Recent Labs    07/04/21 0845 12/06/21 1438  NA 143 136  K 4.5 4.1  CL 106 102  CO2 28 25  GLUCOSE 97 135*  BUN 16 19  CREATININE 0.98 1.17  CALCIUM 10.0 9.2   CrCl cannot be calculated (Patient's most recent lab result is older than the maximum 21 days allowed.).     Latest Ref Rng & Units 12/06/2021    2:38 PM 07/04/2021    8:45 AM 03/20/2021    9:26 AM  CMP  Glucose 70 - 99 mg/dL 135  97  92   BUN 6 - 23 mg/dL '19  16  17   '$ Creatinine 0.40 - 1.50 mg/dL 1.17  0.98  1.06   Sodium 135 - 145 mEq/L 136  143  140   Potassium 3.5 - 5.1 mEq/L 4.1  4.5  4.5   Chloride 96 - 112 mEq/L 102  106  104   CO2 19 - 32 mEq/L '25  28  26   '$ Calcium 8.4 - 10.5 mg/dL 9.2  10.0  9.8   Total Protein 6.0 - 8.3 g/dL 6.6  6.5    Total Bilirubin 0.2 - 1.2 mg/dL 1.3  1.0    Alkaline Phos 39 - 117 U/L 65  54    AST 0 - 37 U/L 27  31    ALT 0 - 53 U/L 21  21        Latest Ref Rng & Units 12/06/2021    2:38 PM 02/16/2021    9:31 AM 08/07/2020    9:01 AM  CBC  WBC 4.0 - 10.5 K/uL 7.4  7.8  8.3   Hemoglobin 13.0 - 17.0 g/dL 18.0 Called to [facility]  16.7  16.7   Hematocrit 39.0 - 52.0 % 54.6  50.1  49.9   Platelets 150.0 - 400.0 K/uL 120.0  136.0  164.0    Lipid  Panel Recent Labs    07/04/21 0845  CHOL 103  TRIG 116.0  LDLCALC 42  VLDL 23.2  HDL 37.50*  CHOLHDL 3    HEMOGLOBIN A1C Lab Results  Component Value Date   HGBA1C 6.3 07/04/2021   TSH No results for input(s): "TSH" in the last 8760 hours.  Medications and allergies  No Known Allergies   Current Outpatient Medications on File Prior to Visit  Medication Sig Dispense Refill   allopurinol (ZYLOPRIM) 300 MG tablet TAKE 1 TABLET BY MOUTH TWICE  DAILY 180 tablet 0   aspirin 81 MG tablet Take 81 mg by mouth daily.     Cholecalciferol (VITAMIN D) 2000 UNITS tablet Take 2,000 Units by mouth daily.     diclofenac Sodium (VOLTAREN) 1 % GEL Apply 2 g topically 4 (four) times daily. 100 g 3   docusate sodium (COLACE) 100 MG capsule Take 100 mg by mouth every other day.     empagliflozin (JARDIANCE) 25 MG TABS tablet Take 1 tablet (25 mg total) by mouth daily before breakfast. 100 tablet 2   FLUAD QUADRIVALENT 0.5  ML injection      fluticasone (FLONASE) 50 MCG/ACT nasal spray USE 2 SPRAYS IN BOTH NOSTRILS  DAILY AS NEEDED FOR ALLERGIES 64 g 1   fluticasone (FLOVENT HFA) 110 MCG/ACT inhaler Inhale 2 puffs into the lungs 2 (two) times daily. 1 each 1   glipiZIDE (GLUCOTROL XL) 10 MG 24 hr tablet TAKE 1 TABLET BY MOUTH DAILY 90 tablet 0   glucose blood (ONE TOUCH ULTRA TEST) test strip Use as instructed to check blood sugar.  DX 250.00 100 each prn   lisinopril (ZESTRIL) 10 MG tablet TAKE 1 TABLET(10 MG) BY MOUTH DAILY 30 tablet 0   metFORMIN (GLUCOPHAGE) 1000 MG tablet TAKE 1 TABLET BY MOUTH TWICE  DAILY WITH MEALS 200 tablet 2   metoprolol tartrate (LOPRESSOR) 50 MG tablet TAKE 1 TABLET BY MOUTH TWICE  DAILY 200 tablet 2   ondansetron (ZOFRAN) 8 MG tablet Take 1 tablet (8 mg total) by mouth every 8 (eight) hours as needed for nausea or vomiting. 20 tablet 0   ONE TOUCH LANCETS MISC Use as directed to check blood sugar.  DX 250.00 200 each prn   pantoprazole (PROTONIX) 40 MG tablet TAKE 1  TABLET BY MOUTH DAILY 90 tablet 1   rosuvastatin (CRESTOR) 20 MG tablet TAKE 1 TABLET BY MOUTH AT  BEDTIME 90 tablet 0   verapamil (CALAN-SR) 120 MG CR tablet TAKE 1 TABLET BY MOUTH IN THE  MORNING 100 tablet 2   No current facility-administered medications on file prior to visit.    Radiology:  No results found. Cardiac Studies:   TEE 06/06/10: Normal LVEF. Mod LVH, chordal SAM & LVOT obst with 194m Hg PG. Not HOCM. No significant mitral regurgitation.    Carotid Doppler  [10/11/2013]: No evidence of hemodynamically significant stenosis in the bilateral carotid bifurcation vessels, Tortuosity may be the source of bruit. No significant change from 05/28/2010.  Echocardiogram   11/12/2017: Left ventricle cavity is normal in size. Severe concentric hypertrophy of the left ventricle. Posterior and septal walls both measure 2 cm in AP dimension. Normal global wall motion. Doppler evidence of grade II (pseudonormal) diastolic dysfunction, elevated LAP. Calculated EF 55%. Hypertrophic cardiomyopathy. Left atrial cavity is mildly dilated, measuring 4.6 cm in AP dimension. Inadequate tricuspid regurgitation jet to estimate pulmonary artery pressure. Normal right atrial pressure. No significant change compared to prior study dated 12/17/2016.  Nuclear stress test   11/10/2017: 1. Lexiscan stress test was performed. Exercise capacity was not assessed. Stress symptoms included dyspnea. Peak blood pressure was 150/82 mmHg. The resting and stress electrocardiogram demonstrated normal sinus rhythm, normal resting conduction, frequent multifocal PAC;s, lateral T wave inversions. Stress EKG is non diagnostic for ischemia as it is a pharmacologic stress. 2. The overall quality of the study is good. Left ventricular cavity is noted to be normal on the rest and stress studies. Gated SPECT images reveal normal myocardial thickening and wall motion. The left ventricular ejection fraction was calculated to be 40%,  although visually looks normal. SPECT images demonstrate small perfusion abnormality of mild intensity in the basal inferior and mid inferior myocardial wall(s) on the stress images with mild reversibility. 3. Intermediate risk study.  Coronary angiogram 01/06/2018:  Mid LAD 20% stenosis, otherwise no significant disease. Normal LVEF. 140 mmHg intraventricular PG noted. LVEDP moderate to severely elevated.  Echocardiogram 03/12/2022:  Normal LV systolic function with visual EF 60-65%. Left ventricle cavity  is small. Severe concentric hypertrophy of the left ventricle. Normal  global wall motion. Doppler  evidence of grade II (pseudonormal) diastolic  dysfunction, elevated LAP. Calculated EF 65%.  Left atrial cavity is severely dilated at 55.6 ml/m^2.  Structurally normal tricuspid valve with trace regurgitation. No evidence  of pulmonary hypertension.  Compared to 10/2017, LA is now severely dilated compared to mild.  Otherwise, no significant change.   EKG:  EKG 03/27/2022: Normal sinus rhythm at rate of 66 bpm.  Left atrial enlargement.  Left axis deviation.  Right bundle branch block, bifascicular block.  LVH with repolarization abnormality.  Compared to previous EKG on 03/26/2021, no significant change.  Assessment     ICD-10-CM   1. Hypertrophic obstructive cardiomyopathy (HOCM) (HCC)  I42.1 EKG 12-Lead    2. Primary hypertension  I10     3. Pure hypercholesterolemia  E78.00     4. OSA (obstructive sleep apnea)  G47.33 Ambulatory referral to Sleep Studies    5. Primary insomnia  F51.01 traZODone (DESYREL) 50 MG tablet    6. Moderate persistent asthma with acute exacerbation  J45.41 albuterol (PROAIR HFA) 108 (90 Base) MCG/ACT inhaler      Meds ordered this encounter  Medications   traZODone (DESYREL) 50 MG tablet    Sig: TAKE 1 TABLET(50 MG) BY MOUTH AT BEDTIME    Dispense:  30 tablet    Refill:  1    Order Specific Question:   Supervising Provider    Answer:   Adrian Prows [[id number]]   albuterol (PROAIR HFA) 108 (90 Base) MCG/ACT inhaler    Sig: INHALE 2 PUFFS INTO THE LUNGS EVERY 4 HOURS AS NEEDED FOR WHEEZING    Dispense:  8.5 g    Refill:  0    Order Specific Question:   Supervising Provider    Answer:   Adrian Prows [[id number]]   Medications Discontinued During This Encounter  Medication Reason   diphenoxylate-atropine (LOMOTIL) 2.5-0.025 MG tablet    sulfamethoxazole-trimethoprim (BACTRIM DS) 800-160 MG tablet    traZODone (DESYREL) 50 MG tablet Reorder   albuterol (PROAIR HFA) 108 (90 Base) MCG/ACT inhaler Reorder      Recommendations:   Meds ordered this encounter  Medications   traZODone (DESYREL) 50 MG tablet    Sig: TAKE 1 TABLET(50 MG) BY MOUTH AT BEDTIME    Dispense:  30 tablet    Refill:  1    Order Specific Question:   Supervising Provider    Answer:   Adrian Prows [[id number]]   albuterol (PROAIR HFA) 108 (90 Base) MCG/ACT inhaler    Sig: INHALE 2 PUFFS INTO THE LUNGS EVERY 4 HOURS AS NEEDED FOR WHEEZING    Dispense:  8.5 g    Refill:  0    Order Specific Question:   Supervising Provider    Answer:   Adrian Prows [[id number]]    ARAFAT COCUZZA  is a 76 y.o.  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  Hypertrophic obstructive cardiomyopathy (HOCM) ([place]) He has not had any hospitalization, leg edema has remained stable and dyspnea is also stable.  No clinical evidence of heart failure. He is on guideline directed medical therapy. Reviewed results of echocardiogram which revealed severely dilated left atrium.  Feel this is related to noncompliance with CPAP in the setting of obstructive sleep apnea. Placed referral to sleep specialist to discuss  other options for management of sleep apnea. This is currently managed by  PCP.  Discussed the importance of compliance with CPAP and weight loss.  Primary hypertension Blood pressure is well controlled. Continue current medications without changes. Discussed importance of diet, exercise, and weight loss.  Pure hypercholesterolemia Reviewed lipid profile from 07/04/2021, lipids are under excellent control. Continues on rosuvastatin 20 mg daily without myalgias.  OSA (obstructive sleep apnea) Discussed above.  Primary insomnia Refill prescription for trazodone 50 mg nightly.  I have refilled his albuterol inhaler to be used as needed for wheezing.  Advised patient to follow-up with primary care provider if symptoms worsen or do not improve.  Follow-up in 1 year or sooner if needed.    Ernst Spell, [place] Office: [phone] Pager: [phone]

## 2022-03-31 ENCOUNTER — Other Ambulatory Visit: Payer: Self-pay | Admitting: Family Medicine

## 2022-04-03 NOTE — Progress Notes (Signed)
[facility] Quality Team Note  Name: Jack [name] Date of Birth: 04-Dec-1945 MRN: [id number] Date: 04/03/2022  [facility] Quality Team has reviewed this patient's chart, please see recommendations below:  [facility] Quality Other; KED: Kidney Health Evaluation Gap- Patient needs Urine Albumin Creatinine Ratio Test completed for gap closure. EGFR has already been completed.

## 2022-04-15 ENCOUNTER — Encounter: Payer: Self-pay | Admitting: Family Medicine

## 2022-04-15 ENCOUNTER — Ambulatory Visit (INDEPENDENT_AMBULATORY_CARE_PROVIDER_SITE_OTHER): Payer: Medicare Other | Admitting: Family Medicine

## 2022-04-15 VITALS — BP 105/65 | HR 62 | Temp 98.0°F | Ht 68.0 in | Wt 251.0 lb

## 2022-04-15 DIAGNOSIS — J4541 Moderate persistent asthma with (acute) exacerbation: Secondary | ICD-10-CM | POA: Diagnosis not present

## 2022-04-15 DIAGNOSIS — R059 Cough, unspecified: Secondary | ICD-10-CM

## 2022-04-15 MED ORDER — DOXYCYCLINE HYCLATE 100 MG PO TABS: 100.0000 mg | ORAL_TABLET | Freq: Two times a day (BID) | ORAL | 0 refills | Status: DC

## 2022-04-15 MED ORDER — PREDNISONE 50 MG PO TABS: 50.0000 mg | ORAL_TABLET | Freq: Every day | ORAL | 0 refills | Status: DC

## 2022-04-15 MED ORDER — BENZONATATE 200 MG PO CAPS: 200.0000 mg | ORAL_CAPSULE | Freq: Two times a day (BID) | ORAL | 0 refills | Status: DC | PRN

## 2022-04-15 NOTE — Patient Instructions (Signed)
Great to see you today.

## 2022-04-15 NOTE — Progress Notes (Signed)
Jack [name] , Oct 15, 1945, 76 y.o., male MRN: [id number] Patient Care Team    Relationship Specialty Notifications Start End  Tammi Sou, MD PCP - General   05/16/10    Comment: Donne Anon, MD Consulting Physician Cardiology  06/10/11   Chesley Mires, MD Consulting Physician Pulmonary Disease  05/27/12   Izora Gala, MD Consulting Physician Otolaryngology  11/03/12   Rutherford Guys, MD Consulting Physician Ophthalmology  04/20/14   Inocencio Homes, [place] Consulting Physician Podiatry  01/18/16   Rigoberto Noel, MD Consulting Physician Pulmonary Disease  01/29/16   de Rogers, Mike Gip, MD (Inactive) Consulting Physician Pulmonary Disease  08/27/16    Comment: Sleep medicine  Gatha Mayer, MD Consulting Physician Gastroenterology  12/23/17   Darreld Mclean, MD Referring Physician Dermatology  03/06/18   Lavonna Monarch, MD (Inactive) Consulting Physician Dermatology  01/24/21     Chief Complaint  Patient presents with   Cough    X 1 mo;      Subjective: Pt presents for an OV with complaints of cough, wheezing and phlegm production of 4 week sin duration. He is compliant with his inhalers and antihistamine for his asthma.      04/15/2022    8:16 AM 05/02/2021    1:05 PM 02/16/2021    9:00 AM 03/15/2020    1:18 PM 03/05/2019    8:04 AM  Depression screen PHQ 2/9  Decreased Interest 0 0 0 0 0  Down, Depressed, Hopeless 0 0 0 0 0  PHQ - 2 Score 0 0 0 0 0    No Known Allergies Social History   Social History Narrative   Married, 1 son, 1 granddaughter.   Retired from [employer] 2000.   Hobbies: trading farm equipment.  Used to be a Environmental manager.   Tobacco x many years, quit @ 2005   No ETOH or drugs.   No regular exercise.Smoking Status:  quit            Past Medical History:  Diagnosis Date   Allergy    CAD (coronary artery disease)    see PSH section for details   Carotid bruit 05/28/10   Tortuosity of carotids on doppler  u/s, no stenosis.   Cataract    Chronic diastolic heart failure (HCC)    Grd II DD   Colon cancer screening    cologuard POSITIVE 09/2017-->colonoscopy 12/19/17; multiple adenomatous polyps, recall 3 yrs.   COPD (chronic obstructive pulmonary disease) (HCC)    Diabetes mellitus    type 2- non insulin-requiring   DOE (dyspnea on exertion)    Cardiology w/u unrevealing except LVOT obstruction: suspect dyspnea due to HOCM, morbid obesity and OSA--stable as of 02/2017 cardiol f/u.   Fatty liver u/s and CT 2009   w/mildly elevated transaminases; u/s 12/2012 reconfirmed dx.  Hep B and C testing negative.   GERD (gastroesophageal reflux disease)    EDG 04-03-01, + distal esophageal stricture dilation   Gout    Heart murmur    Hiatal hernia    History of iron deficiency anemia 02/2019   Hb 12.9 02/2019->home hemoccults ordered but pt never returned these.  GI referral was done but pt did not return their call/unable to be contacted. Fall 2021 iron low but Hb normal->iron supp brought iron levels up (hemoccults neg x 3).   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 Grd II DD.     Moderate persistent asthma 04/19/2014   Morbid obesity (HCC)    OSA on CPAP    Could not tolerate CPAP, not surgical candidate per Dr. Benjamine Mola.  Restarted CPAP (auto titrate 5-15 with full facemask--Dr. Alva 2017.  Getting mask fitting/leak issues worked out as of 07/2016 pulm f/u.   Osteoarthritis, multiple sites    primarily knees and back.   Stricture and stenosis of esophagus    Venous insufficiency of both lower extremities    Past Surgical History:  Procedure Laterality Date   CARDIOVASCULAR STRESS TEST  2006; 04/27/10; 10/2017   Normal  stress nuclear study, normal EF.  2019: EF 40%, inferior ischemia--intermediate risk study-->Dr. Einar Gip to do cath.   COLONOSCOPY  x 3   Most recent-->12/19/17 (after + cologuard) multiple adenomatous polyps-->recall 3 yrs.   LEFT HEART CATH AND CORONARY ANGIOGRAPHY N/A 01/06/2018   One area of 80% ostial stenosis--small and hard to visualize. No intervention.  DAPT for >1 yr recommended.  Procedure: LEFT HEART CATH AND CORONARY ANGIOGRAPHY;  Surgeon: Adrian Prows, MD;  Location: [facility];  Service: Cardiovascular;  Laterality: N/A;   NASAL SINUS SURGERY     Sleep study  07/2016   OSA; CPAP trial at 10 cm H20 recommended by pulm (Dr. Tennis Must Dios--[facility]).   [place]   ? Discectomy   TEE WITHOUT CARDIOVERSION  05/2010   Dr. Einar Gip; severe LVH, no valvular abnormalities   TRANSESOPHAGEAL ECHOCARDIOGRAM  06/06/10; 12/2011; 09/2014; 11/2016   LVOT obst with 135 mm Hg PG. HOCM. No AV obstruction to flow. No mitral regurg..  Repeat echo 01/09/12 no change.  2016: EF 60%, severe conc LV hypertrophy, normal global wall motion, LA mildly dilated, systolic anterior motion of mitral valve chord, mild MR, mild TR, mild pulm HTN--no signif change since 2013.  Repeat echo 11/2016 and 10/2017 no signif change compared to prior studies.   TRANSTHORACIC ECHOCARDIOGRAM  05/28/2010; 10/2017   2012; Normal LVEF, LVOT obst, lateral wall hypokin.  10/2017: EF 55%, hypertrophic CM, grd II dd, normal global wall motion. 02/2022 no change except now with severe LA dilation   Family History  Problem Relation Age of Onset   Diabetes Mother    Prostate cancer Father        Prostate   Breast cancer Sister    Diabetes Maternal Aunt    Breast cancer Maternal Aunt    Hyperlipidemia Sister    Hypertension Neg Hx    Coronary artery disease Neg Hx    Colon cancer Neg Hx    Stomach cancer Neg Hx    Rectal cancer Neg Hx    Esophageal cancer Neg Hx    Allergies as of 04/15/2022   No Known Allergies       Medication List        Accurate as of April 15, 2022  8:36 AM. If you have any questions, ask your nurse or doctor.          STOP taking these medications    Fluad Quadrivalent 0.5 ML injection Generic drug: influenza vaccine adjuvanted Stopped by: Howard Pouch, DO       TAKE these medications    albuterol 108 (90 Base) MCG/ACT inhaler Commonly  known as: ProAir HFA INHALE 2 PUFFS INTO THE LUNGS EVERY 4 HOURS AS NEEDED FOR WHEEZING   allopurinol 300 MG tablet Commonly known as: ZYLOPRIM TAKE 1 TABLET BY MOUTH TWICE  DAILY   aspirin 81 MG tablet Take 81 mg by mouth daily.   benzonatate 200 MG capsule Commonly known as: TESSALON Take 1 capsule (200 mg total) by mouth 2 (two) times daily as needed for cough. Started by: Howard Pouch, DO   diclofenac Sodium 1 % Gel Commonly known as: VOLTAREN Apply 2 g topically 4 (four) times daily.   docusate sodium 100 MG capsule Commonly known as: COLACE Take 100 mg by mouth every other day.   doxycycline 100 MG tablet Commonly known as: VIBRA-TABS Take 1 tablet (100 mg total) by mouth 2 (two) times daily. Started by: Howard Pouch, DO   empagliflozin 25 MG Tabs tablet Commonly known as: Jardiance Take 1 tablet (25 mg total) by mouth daily before breakfast.   fluticasone 110 MCG/ACT inhaler Commonly known as: FLOVENT HFA Inhale 2 puffs into the lungs 2 (two) times daily.   fluticasone 50 MCG/ACT nasal spray Commonly known as: FLONASE USE 2 SPRAYS IN BOTH NOSTRILS  DAILY AS NEEDED FOR ALLERGIES   glipiZIDE 10 MG 24 hr tablet Commonly known as: GLUCOTROL XL TAKE 1 TABLET BY MOUTH DAILY   glucose blood test strip Commonly known as: ONE TOUCH ULTRA TEST Use as instructed to check blood sugar.  DX 250.00   lisinopril 10 MG tablet Commonly known as: ZESTRIL TAKE 1 TABLET(10 MG) BY MOUTH DAILY   metFORMIN 1000 MG tablet Commonly known as: GLUCOPHAGE TAKE 1 TABLET BY MOUTH TWICE  DAILY WITH MEALS   metoprolol  tartrate 50 MG tablet Commonly known as: LOPRESSOR TAKE 1 TABLET BY MOUTH TWICE  DAILY   ondansetron 8 MG tablet Commonly known as: Zofran Take 1 tablet (8 mg total) by mouth every 8 (eight) hours as needed for nausea or vomiting.   ONE TOUCH LANCETS Misc Use as directed to check blood sugar.  DX 250.00   pantoprazole 40 MG tablet Commonly known as: PROTONIX TAKE 1 TABLET BY MOUTH DAILY   predniSONE 50 MG tablet Commonly known as: DELTASONE Take 1 tablet (50 mg total) by mouth daily with breakfast. Started by: Howard Pouch, DO   rosuvastatin 20 MG tablet Commonly known as: CRESTOR TAKE 1 TABLET BY MOUTH AT  BEDTIME   traZODone 50 MG tablet Commonly known as: DESYREL TAKE 1 TABLET(50 MG) BY MOUTH AT BEDTIME   verapamil 120 MG CR tablet Commonly known as: CALAN-SR TAKE 1 TABLET BY MOUTH IN THE  MORNING   Vitamin D 50 MCG (2000 UT) tablet Take 2,000 Units by mouth daily.        All past medical history, surgical history, allergies, family history, immunizations andmedications were updated in the EMR today and reviewed under the history and medication portions of their EMR.     Review of Systems  Constitutional:  Negative for chills, fever and malaise/fatigue.  HENT:  Positive for congestion. Negative for ear pain, sinus pain and sore throat.   Eyes:  Negative for pain, discharge and redness.  Respiratory:  Positive for cough, sputum production and wheezing. Negative for shortness of breath.   Gastrointestinal:  Negative for abdominal pain, constipation, diarrhea, nausea and vomiting.  Musculoskeletal:  Negative for myalgias.  Skin:  Negative for rash.  Neurological:  Negative for dizziness and headaches.   Negative, with the exception of above mentioned in HPI  Objective:  BP 105/65   Pulse 62   Temp 98 F (36.7 C) (Oral)   Ht '5\' 8"'$  (1.727 m)   Wt 251 lb (113.9 kg)   SpO2 95%   BMI 38.16 kg/m  Body mass index is 38.16 kg/m. Physical Exam Vitals and  nursing note reviewed.  Constitutional:      General: He is not in acute distress.    Appearance: Normal appearance. He is not ill-appearing, toxic-appearing or diaphoretic.  HENT:     Head: Normocephalic and atraumatic.     Right Ear: Tympanic membrane and ear canal normal.     Left Ear: Tympanic membrane and ear canal normal.     Nose: Congestion and rhinorrhea present.     Mouth/Throat:     Pharynx: No oropharyngeal exudate or posterior oropharyngeal erythema.  Eyes:     General: No scleral icterus.       Right eye: No discharge.        Left eye: No discharge.     Extraocular Movements: Extraocular movements intact.     Pupils: Pupils are equal, round, and reactive to light.  Cardiovascular:     Rate and Rhythm: Normal rate and regular rhythm.     Heart sounds: Murmur heard.  Pulmonary:     Effort: No respiratory distress.     Breath sounds: No stridor. Wheezing and rhonchi present. No rales.  Chest:     Chest wall: No tenderness.  Musculoskeletal:     Cervical back: Neck supple.     Right lower leg: No edema.     Left lower leg: No edema.  Lymphadenopathy:     Cervical: No cervical adenopathy.  Skin:    Findings: No rash.  Neurological:     Mental Status: He is alert and oriented to person, place, and time. Mental status is at baseline.  Psychiatric:        Mood and Affect: Mood normal.        Behavior: Behavior normal.        Thought Content: Thought content normal.        Judgment: Judgment normal.      No results found. No results found. No results found for this or any previous visit (from the past 24 hour(s)).  Assessment/Plan: Jack [name] is a 76 y.o. male present for OV for  Cough, unspecified type/Moderate persistent asthmatic bronchitis with acute exacerbation Rest, hydrate.  mucinex (DM if cough), nettie pot or nasal saline.  Doxy and pred prescribed, take until completed.  Tessalon perles for cough F/U 2 weeks if not improved.   - predniSONE  (DELTASONE) 50 MG tablet; Take 1 tablet (50 mg total) by mouth daily with breakfast.  Dispense: 5 tablet; Refill: 0 - doxycycline (VIBRA-TABS) 100 MG tablet; Take 1 tablet (100 mg total) by mouth 2 (two) times daily.  Dispense: 20 tablet; Refill: 0    Reviewed expectations re: course of current medical issues. Discussed self-management of symptoms. Outlined signs and symptoms indicating need for more acute intervention. Patient verbalized understanding and all questions were answered. Patient received an After-Visit Summary.    No orders of the defined types were placed in this encounter.  Meds ordered this encounter  Medications   predniSONE (DELTASONE) 50 MG tablet    Sig: Take 1 tablet (50 mg total) by mouth daily with breakfast.    Dispense:  5 tablet    Refill:  0   doxycycline (VIBRA-TABS) 100 MG tablet    Sig:  Take 1 tablet (100 mg total) by mouth 2 (two) times daily.    Dispense:  20 tablet    Refill:  0   benzonatate (TESSALON) 200 MG capsule    Sig: Take 1 capsule (200 mg total) by mouth 2 (two) times daily as needed for cough.    Dispense:  20 capsule    Refill:  0   Referral Orders  No referral(s) requested today     Note is dictated utilizing voice recognition software. Although note has been proof read prior to signing, occasional typographical errors still can be missed. If any questions arise, please do not hesitate to call for verification.   electronically signed by:  Howard Pouch, DO  [place]

## 2022-04-25 ENCOUNTER — Telehealth: Payer: Self-pay

## 2022-04-25 NOTE — Telephone Encounter (Signed)
Pt advised to schedule follow up if not improving since last visit 2 weeks ago. Pt agreeable to appt.

## 2022-04-25 NOTE — Telephone Encounter (Signed)
WCB to sched AWV

## 2022-04-25 NOTE — Telephone Encounter (Signed)
Patient following up on pharmacy refill requests.  I told patient Dr. Raoul Pitch has been out of office and today was her 1st day back. Refill requests are in Team [facility] folder. Please call patient at [phone].  Benzonate Doxycycline Prednizone

## 2022-04-26 ENCOUNTER — Encounter: Payer: Self-pay | Admitting: Family Medicine

## 2022-04-26 ENCOUNTER — Ambulatory Visit (INDEPENDENT_AMBULATORY_CARE_PROVIDER_SITE_OTHER): Payer: Medicare Other | Admitting: Family Medicine

## 2022-04-26 VITALS — BP 109/67 | HR 83 | Temp 98.2°F | Ht 68.0 in | Wt 253.8 lb

## 2022-04-26 DIAGNOSIS — R600 Localized edema: Secondary | ICD-10-CM

## 2022-04-26 DIAGNOSIS — R051 Acute cough: Secondary | ICD-10-CM

## 2022-04-26 DIAGNOSIS — J4541 Moderate persistent asthma with (acute) exacerbation: Secondary | ICD-10-CM | POA: Diagnosis not present

## 2022-04-26 LAB — POC COVID19 BINAXNOW: SARS Coronavirus 2 Ag: NEGATIVE

## 2022-04-26 LAB — POCT INFLUENZA A/B
Influenza A, POC: NEGATIVE
Influenza B, POC: NEGATIVE

## 2022-04-26 MED ORDER — PREDNISONE 20 MG PO TABS: ORAL_TABLET | ORAL | 0 refills | Status: DC

## 2022-04-26 MED ORDER — HYDROCODONE BIT-HOMATROP MBR 5-1.5 MG/5ML PO SOLN: ORAL | 0 refills | Status: DC

## 2022-04-26 NOTE — Progress Notes (Signed)
OFFICE VISIT  04/26/2022  CC:  Chief Complaint  Patient presents with   Cough    Pt c/o chest congestion, cough for 3 weeks now. Productive with green phlegm  . Runny nose started 3-4 days ago. Used otc cough syrup and nightquil. No diarrhea but lightheaded a few times. C/o bilat feet edema but mainly right foot.   Patient is a 76 y.o. male who presents for cough.  HPI: Approximately 3-week history of cough and wheeze.  Patient has moderate persistent asthma. He was seen on 04/15/2022 by Dr. Raoul Pitch for complaint of approximately 1 month of cough.  He was diagnosed with acute asthma exacerbation and prescribed prednisone 50 mg a day x 5 days.  Also doxycycline x 10 days and Tessalon Perles. Did improve some from these therapies but he estimates about 50% improvement only. Still having coughing fits, feeling of wheezing and chest tightness, poor sleep at night. He is tired but denies body aches and denies fever. He uses albuterol inhaler in the morning and then at bedtime.  He is not sure if this is helping much.  He has noted increased swelling in both legs the last few days. He admits to eating a lot of high sodium foods over the holidays  Review of systems: No nausea, vomiting, or diarrhea. No rash. He has a mild forehead headache. No face pain.  No purulent nasal mucus.  Past Medical History:  Diagnosis Date   Allergy    CAD (coronary artery disease)    see PSH section for details   Carotid bruit 05/28/10   Tortuosity of carotids on doppler u/s, no stenosis.   Cataract    Chronic diastolic heart failure (HCC)    Grd II DD   Colon cancer screening    cologuard POSITIVE 09/2017-->colonoscopy 12/19/17; multiple adenomatous polyps, recall 3 yrs.   COPD (chronic obstructive pulmonary disease) (HCC)    Diabetes mellitus    type 2- non insulin-requiring   DOE (dyspnea on exertion)    Cardiology w/u unrevealing except LVOT obstruction: suspect dyspnea due to HOCM, morbid obesity  and OSA--stable as of 02/2017 cardiol f/u.   Fatty liver u/s and CT 2009   w/mildly elevated transaminases; u/s 12/2012 reconfirmed dx.  Hep B and C testing negative.   GERD (gastroesophageal reflux disease)    EDG 04-03-01, + distal esophageal stricture dilation   Gout    Heart murmur    Hiatal hernia    History of iron deficiency anemia 02/2019   Hb 12.9 02/2019->home hemoccults ordered but pt never returned these.  GI referral was done but pt did not return their call/unable to be contacted. Fall 2021 iron low but Hb normal->iron supp brought iron levels up (hemoccults neg x 3).   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 Grd II DD.     Moderate persistent asthma 04/19/2014   Morbid obesity (HCC)    OSA on CPAP    Could not tolerate CPAP, not surgical candidate per Dr. Benjamine Mola.  Restarted CPAP (auto titrate 5-15 with full facemask--Dr. Alva 2017.  Getting mask fitting/leak issues worked out as of 07/2016 pulm f/u.   Osteoarthritis, multiple sites    primarily knees and back.  Stricture and stenosis of esophagus    Venous insufficiency of both lower extremities     Past Surgical History:  Procedure Laterality Date   CARDIOVASCULAR STRESS TEST  2006; 04/27/10; 10/2017   Normal stress nuclear study, normal EF.  2019: EF 40%, inferior ischemia--intermediate risk study-->Dr. Einar Gip to do cath.   COLONOSCOPY  x 3   Most recent-->12/19/17 (after + cologuard) multiple adenomatous polyps-->recall 3 yrs.   LEFT HEART CATH AND CORONARY ANGIOGRAPHY N/A 01/06/2018   One area of 80% ostial stenosis--small and hard to visualize. No intervention.  DAPT for >1 yr recommended.  Procedure: LEFT HEART CATH AND CORONARY ANGIOGRAPHY;   Surgeon: Adrian Prows, MD;  Location: [facility];  Service: Cardiovascular;  Laterality: N/A;   NASAL SINUS SURGERY     Sleep study  07/2016   OSA; CPAP trial at 10 cm H20 recommended by pulm (Dr. Tennis Must Dios--[place] pulm).   [place]   ? Discectomy   TEE WITHOUT CARDIOVERSION  05/2010   Dr. Einar Gip; severe LVH, no valvular abnormalities   TRANSESOPHAGEAL ECHOCARDIOGRAM  06/06/10; 12/2011; 09/2014; 11/2016   LVOT obst with 135 mm Hg PG. HOCM. No AV obstruction to flow. No mitral regurg..  Repeat echo 01/09/12 no change.  2016: EF 60%, severe conc LV hypertrophy, normal global wall motion, LA mildly dilated, systolic anterior motion of mitral valve chord, mild MR, mild TR, mild pulm HTN--no signif change since 2013.  Repeat echo 11/2016 and 10/2017 no signif change compared to prior studies.   TRANSTHORACIC ECHOCARDIOGRAM  05/28/2010; 10/2017   2012; Normal LVEF, LVOT obst, lateral wall hypokin.  10/2017: EF 55%, hypertrophic CM, grd II dd, normal global wall motion. 02/2022 no change except now with severe LA dilation    Outpatient Medications Prior to Visit  Medication Sig Dispense Refill   albuterol (PROAIR HFA) 108 (90 Base) MCG/ACT inhaler INHALE 2 PUFFS INTO THE LUNGS EVERY 4 HOURS AS NEEDED FOR WHEEZING 8.5 g 0   allopurinol (ZYLOPRIM) 300 MG tablet TAKE 1 TABLET BY MOUTH TWICE  DAILY 180 tablet 0   aspirin 81 MG tablet Take 81 mg by mouth daily.     Cholecalciferol (VITAMIN D) 2000 UNITS tablet Take 2,000 Units by mouth daily.     diclofenac Sodium (VOLTAREN) 1 % GEL Apply 2 g topically 4 (four) times daily. 100 g 3   docusate sodium (COLACE) 100 MG capsule Take 100 mg by mouth every other day.     empagliflozin (JARDIANCE) 25 MG TABS tablet Take 1 tablet (25 mg total) by mouth daily before breakfast. 100 tablet 2   fluticasone (FLONASE) 50 MCG/ACT nasal spray USE 2 SPRAYS IN BOTH NOSTRILS  DAILY AS NEEDED FOR ALLERGIES 64 g 1   fluticasone (FLOVENT HFA) 110 MCG/ACT inhaler Inhale 2  puffs into the lungs 2 (two) times daily. 1 each 1   glipiZIDE (GLUCOTROL XL) 10 MG 24 hr tablet TAKE 1 TABLET BY MOUTH DAILY 90 tablet 0   glucose blood (ONE TOUCH ULTRA TEST) test strip Use as instructed to check blood sugar.  DX 250.00 100 each prn   lisinopril (ZESTRIL) 10 MG tablet TAKE 1 TABLET(10 MG) BY MOUTH DAILY 30 tablet 0   metFORMIN (GLUCOPHAGE) 1000 MG tablet TAKE 1 TABLET BY MOUTH TWICE  DAILY WITH MEALS 200 tablet 2   metoprolol tartrate (LOPRESSOR) 50 MG tablet TAKE 1 TABLET BY MOUTH TWICE  DAILY 200 tablet 2   ondansetron (ZOFRAN) 8 MG tablet Take 1 tablet (  8 mg total) by mouth every 8 (eight) hours as needed for nausea or vomiting. 20 tablet 0   ONE TOUCH LANCETS MISC Use as directed to check blood sugar.  DX 250.00 200 each prn   pantoprazole (PROTONIX) 40 MG tablet TAKE 1 TABLET BY MOUTH DAILY 90 tablet 1   rosuvastatin (CRESTOR) 20 MG tablet TAKE 1 TABLET BY MOUTH AT  BEDTIME 90 tablet 0   traZODone (DESYREL) 50 MG tablet TAKE 1 TABLET(50 MG) BY MOUTH AT BEDTIME 30 tablet 1   verapamil (CALAN-SR) 120 MG CR tablet TAKE 1 TABLET BY MOUTH IN THE  MORNING 100 tablet 2   predniSONE (DELTASONE) 50 MG tablet Take 1 tablet (50 mg total) by mouth daily with breakfast. 5 tablet 0   benzonatate (TESSALON) 200 MG capsule Take 1 capsule (200 mg total) by mouth 2 (two) times daily as needed for cough. (Patient not taking: Reported on 04/26/2022) 20 capsule 0   doxycycline (VIBRA-TABS) 100 MG tablet Take 1 tablet (100 mg total) by mouth 2 (two) times daily. (Patient not taking: Reported on 04/26/2022) 20 tablet 0   No facility-administered medications prior to visit.    No Known Allergies  Review of Systems  As per HPI  PE:    04/26/2022    3:32 PM 04/15/2022    8:16 AM 03/27/2022    8:48 AM  Vitals with BMI  Height '5\' 8"'$  '5\' 8"'$  '5\' 8"'$   Weight 253 lbs 13 oz 251 lbs 251 lbs 6 oz  BMI 38.6 37.90 24.09  Systolic 735 329 924  Diastolic 67 65 66  Pulse 83 62 95  O2 sat 95%  RA  Physical Exam  Gen: Alert, well appearing.  Patient is oriented to person, place, time, and situation. AFFECT: pleasant, lucid thought and speech. QAS:TMHD: no injection, icteris, swelling, or exudate.  EOMI, PERRLA. Mouth: lips without lesion/swelling.  Oral mucosa pink and moist. Oropharynx without erythema, exudate, or swelling.  CV: RRR, 3-4/6 syst murmur unchanged Lungs: Diffuse inspiratory rhonchi and expiratory wheezing.  Aeration mildly diminished in all fields.  Nonlabored respirations. Extremities: 3-4+ bilateral lower extremity pitting edema down into the tops of the feet.   LABS:  Last CBC Lab Results  Component Value Date   WBC 7.4 12/06/2021   HGB 18.0 Called to [facility] 12/06/2021   HCT 54.6 (H) 12/06/2021   MCV 93.2 12/06/2021   MCH 30.2 06/17/2020   RDW 15.7 (H) 12/06/2021   PLT 120.0 (L) 62/22/9798   Last metabolic panel Lab Results  Component Value Date   GLUCOSE 135 (H) 12/06/2021   NA 136 12/06/2021   K 4.1 12/06/2021   CL 102 12/06/2021   CO2 25 12/06/2021   BUN 19 12/06/2021   CREATININE 1.17 12/06/2021   GFRNONAA 80 06/17/2020   CALCIUM 9.2 12/06/2021   PROT 6.6 12/06/2021   ALBUMIN 4.4 12/06/2021   LABGLOB 2.0 06/17/2020   AGRATIO 2.2 06/17/2020   BILITOT 1.3 (H) 12/06/2021   ALKPHOS 65 12/06/2021   AST 27 12/06/2021   ALT 21 12/06/2021   Last hemoglobin A1c Lab Results  Component Value Date   HGBA1C 6.3 07/04/2021   IMPRESSION AND PLAN:  #1 acute URI with cough, with acute exacerbation of moderate persistent asthma. Incomplete improvement with 5-day burst of steroids and 10 days of doxycycline. Flu and covid testing today: NEG Depo-Medrol 80 mg IM here today. Start prednisone tomorrow: 60 mg daily x 2 days, 40 mg daily x 5 days, 20  mg daily x 5 days. Encouraged him to use his albuterol every 4 hours as needed. Hycodan syrup, 1 to 2 teaspoons nightly as needed cough, 120 mL.  #2 bilateral lower extremity edema. Excessive  sodium intake plus recent prednisone treatment.  This is superimposed on his chronic venous insufficiency edema. Recommended significant cut back in sodium intake, elevate legs regularly. This is likely to continue while he is on prednisone over the next couple of weeks.  Signs/symptoms to call or return for were reviewed and pt expressed understanding.  An After Visit Summary was printed and given to the patient.  FOLLOW UP: Return if symptoms worsen or fail to improve.  Signed:  Crissie Sickles, MD           04/26/2022

## 2022-05-08 ENCOUNTER — Ambulatory Visit (INDEPENDENT_AMBULATORY_CARE_PROVIDER_SITE_OTHER): Payer: Medicare Other

## 2022-05-08 DIAGNOSIS — Z0000 Encounter for general adult medical examination without abnormal findings: Secondary | ICD-10-CM | POA: Diagnosis not present

## 2022-05-08 NOTE — Patient Instructions (Signed)
Health Maintenance, Male Adopting a healthy lifestyle and getting preventive care are important in promoting health and wellness. Ask your health care provider about: The right schedule for you to have regular tests and exams. Things you can do on your own to prevent diseases and keep yourself healthy. What should I know about diet, weight, and exercise? Eat a healthy diet  Eat a diet that includes plenty of vegetables, fruits, low-fat dairy products, and lean protein. Do not eat a lot of foods that are high in solid fats, added sugars, or sodium. Maintain a healthy weight Body mass index (BMI) is a measurement that can be used to identify possible weight problems. It estimates body fat based on height and weight. Your health care provider can help determine your BMI and help you achieve or maintain a healthy weight. Get regular exercise Get regular exercise. This is one of the most important things you can do for your health. Most adults should: Exercise for at least 150 minutes each week. The exercise should increase your heart rate and make you sweat (moderate-intensity exercise). Do strengthening exercises at least twice a week. This is in addition to the moderate-intensity exercise. Spend less time sitting. Even light physical activity can be beneficial. Watch cholesterol and blood lipids Have your blood tested for lipids and cholesterol at 76 years of age, then have this test every 5 years. You may need to have your cholesterol levels checked more often if: Your lipid or cholesterol levels are high. You are older than 76 years of age. You are at high risk for heart disease. What should I know about cancer screening? Many types of cancers can be detected early and may often be prevented. Depending on your health history and family history, you may need to have cancer screening at various ages. This may include screening for: Colorectal cancer. Prostate cancer. Skin cancer. Lung  cancer. What should I know about heart disease, diabetes, and high blood pressure? Blood pressure and heart disease High blood pressure causes heart disease and increases the risk of stroke. This is more likely to develop in people who have high blood pressure readings or are overweight. Talk with your health care provider about your target blood pressure readings. Have your blood pressure checked: Every 3-5 years if you are 18-39 years of age. Every year if you are 40 years old or older. If you are between the ages of 65 and 75 and are a current or former smoker, ask your health care provider if you should have a one-time screening for abdominal aortic aneurysm (AAA). Diabetes Have regular diabetes screenings. This checks your fasting blood sugar level. Have the screening done: Once every three years after age 45 if you are at a normal weight and have a low risk for diabetes. More often and at a younger age if you are overweight or have a high risk for diabetes. What should I know about preventing infection? Hepatitis B If you have a higher risk for hepatitis B, you should be screened for this virus. Talk with your health care provider to find out if you are at risk for hepatitis B infection. Hepatitis C Blood testing is recommended for: Everyone born from 1945 through 1965. Anyone with known risk factors for hepatitis C. Sexually transmitted infections (STIs) You should be screened each year for STIs, including gonorrhea and chlamydia, if: You are sexually active and are younger than 76 years of age. You are older than 76 years of age and your   health care provider tells you that you are at risk for this type of infection. Your sexual activity has changed since you were last screened, and you are at increased risk for chlamydia or gonorrhea. Ask your health care provider if you are at risk. Ask your health care provider about whether you are at high risk for HIV. Your health care provider  may recommend a prescription medicine to help prevent HIV infection. If you choose to take medicine to prevent HIV, you should first get tested for HIV. You should then be tested every 3 months for as long as you are taking the medicine. Follow these instructions at home: Alcohol use Do not drink alcohol if your health care provider tells you not to drink. If you drink alcohol: Limit how much you have to 0-2 drinks a day. Know how much alcohol is in your drink. In the [place] one drink equals one 12 oz bottle of beer (355 mL), one 5 oz glass of wine (148 mL), or one 1 oz glass of hard liquor (44 mL). Lifestyle Do not use any products that contain nicotine or tobacco. These products include cigarettes, chewing tobacco, and vaping devices, such as e-cigarettes. If you need help quitting, ask your health care provider. Do not use street drugs. Do not share needles. Ask your health care provider for help if you need support or information about quitting drugs. General instructions Schedule regular health, dental, and eye exams. Stay current with your vaccines. Tell your health care provider if: You often feel depressed. You have ever been abused or do not feel safe at home. Summary Adopting a healthy lifestyle and getting preventive care are important in promoting health and wellness. Follow your health care provider's instructions about healthy diet, exercising, and getting tested or screened for diseases. Follow your health care provider's instructions on monitoring your cholesterol and blood pressure. This information is not intended to replace advice given to you by your health care provider. Make sure you discuss any questions you have with your health care provider. Document Revised: 09/04/2020 Document Reviewed: 09/04/2020 [employer] Patient Education  2023 [employer]  

## 2022-05-08 NOTE — Progress Notes (Signed)
Subjective:   Jack [name] is a 77 y.o. male who presents for Medicare Annual/Subsequent preventive examination. I connected with  Jack [name] on 05/08/22 by a audio enabled telemedicine application and verified that I am speaking with the correct person using two identifiers.  Patient Location: Home  Provider Location: Office/Clinic  I discussed the limitations of evaluation and management by telemedicine. The patient expressed understanding and agreed to proceed.  Review of Systems    Defer to PCP        Objective:    There were no vitals filed for this visit. There is no height or weight on file to calculate BMI.     05/08/2022    3:15 PM 05/02/2021    1:07 PM 03/15/2020    1:17 PM 03/06/2018    9:00 AM 01/06/2018   10:08 AM 02/28/2017    8:46 AM  Advanced Directives  Does Patient Have a Medical Advance Directive? Yes Yes Yes Yes Yes Yes  Type of Paramedic of [place];Living will Healthcare Power of [place];Living will Living will;Healthcare Power of [place];Living will [place];Living will  Does patient want to make changes to medical advance directive?     No - Patient declined   Copy of [place] in Chart?  No - copy requested No - copy requested No - copy requested Yes No - copy requested    Current Medications (verified) Outpatient Encounter Medications as of 05/08/2022  Medication Sig   albuterol (PROAIR HFA) 108 (90 Base) MCG/ACT inhaler INHALE 2 PUFFS INTO THE LUNGS EVERY 4 HOURS AS NEEDED FOR WHEEZING   allopurinol (ZYLOPRIM) 300 MG tablet TAKE 1 TABLET BY MOUTH TWICE  DAILY   aspirin 81 MG tablet Take 81 mg by mouth daily.   Cholecalciferol (VITAMIN D) 2000 UNITS tablet Take 2,000 Units by mouth daily.   diclofenac Sodium (VOLTAREN) 1 % GEL Apply 2 g topically 4 (four) times daily.   docusate sodium (COLACE) 100 MG capsule Take 100 mg by  mouth every other day.   empagliflozin (JARDIANCE) 25 MG TABS tablet Take 1 tablet (25 mg total) by mouth daily before breakfast.   fluticasone (FLONASE) 50 MCG/ACT nasal spray USE 2 SPRAYS IN BOTH NOSTRILS  DAILY AS NEEDED FOR ALLERGIES   fluticasone (FLOVENT HFA) 110 MCG/ACT inhaler Inhale 2 puffs into the lungs 2 (two) times daily.   glipiZIDE (GLUCOTROL XL) 10 MG 24 hr tablet TAKE 1 TABLET BY MOUTH DAILY   glucose blood (ONE TOUCH ULTRA TEST) test strip Use as instructed to check blood sugar.  DX 250.00   lisinopril (ZESTRIL) 10 MG tablet TAKE 1 TABLET(10 MG) BY MOUTH DAILY   metFORMIN (GLUCOPHAGE) 1000 MG tablet TAKE 1 TABLET BY MOUTH TWICE  DAILY WITH MEALS   metoprolol tartrate (LOPRESSOR) 50 MG tablet TAKE 1 TABLET BY MOUTH TWICE  DAILY   ONE TOUCH LANCETS MISC Use as directed to check blood sugar.  DX 250.00   pantoprazole (PROTONIX) 40 MG tablet TAKE 1 TABLET BY MOUTH DAILY   predniSONE (DELTASONE) 20 MG tablet 3 tabs po qd x 2d then 2 tabs po qd x 5d, then 1 tab po qd x 5d   rosuvastatin (CRESTOR) 20 MG tablet TAKE 1 TABLET BY MOUTH AT  BEDTIME   traZODone (DESYREL) 50 MG tablet TAKE 1 TABLET(50 MG) BY MOUTH AT BEDTIME   verapamil (CALAN-SR) 120 MG CR tablet TAKE 1 TABLET  BY MOUTH IN THE  MORNING   ondansetron (ZOFRAN) 8 MG tablet Take 1 tablet (8 mg total) by mouth every 8 (eight) hours as needed for nausea or vomiting. (Patient not taking: Reported on 05/08/2022)   [DISCONTINUED] HYDROcodone bit-homatropine (HYCODAN) 5-1.5 MG/5ML syrup 2 tsp po qhs prn cough   No facility-administered encounter medications on file as of 05/08/2022.    Allergies (verified) Patient has no known allergies.   History: Past Medical History:  Diagnosis Date   Allergy    CAD (coronary artery disease)    see PSH section for details   Carotid bruit 05/28/10   Tortuosity of carotids on doppler u/s, no stenosis.   Cataract    Chronic diastolic heart failure (HCC)    Grd II DD   Colon cancer  screening    cologuard POSITIVE 09/2017-->colonoscopy 12/19/17; multiple adenomatous polyps, recall 3 yrs.   COPD (chronic obstructive pulmonary disease) (HCC)    Diabetes mellitus    type 2- non insulin-requiring   DOE (dyspnea on exertion)    Cardiology w/u unrevealing except LVOT obstruction: suspect dyspnea due to HOCM, morbid obesity and OSA--stable as of 02/2017 cardiol f/u.   Fatty liver u/s and CT 2009   w/mildly elevated transaminases; u/s 12/2012 reconfirmed dx.  Hep B and C testing negative.   GERD (gastroesophageal reflux disease)    EDG 04-03-01, + distal esophageal stricture dilation   Gout    Heart murmur    Hiatal hernia    History of iron deficiency anemia 02/2019   Hb 12.9 02/2019->home hemoccults ordered but pt never returned these.  GI referral was done but pt did not return their call/unable to be contacted. Fall 2021 iron low but Hb normal->iron supp brought iron levels up (hemoccults neg x 3).   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 Grd II DD.     Moderate persistent asthma 04/19/2014   Morbid obesity (HCC)    OSA on CPAP    Could not tolerate CPAP, not surgical candidate per Dr. Benjamine Mola.  Restarted CPAP (auto titrate 5-15 with full facemask--Dr. Alva 2017.  Getting mask fitting/leak issues worked out as of 07/2016 pulm f/u.   Osteoarthritis, multiple sites    primarily knees and back.   Stricture and stenosis of esophagus    Venous insufficiency of both lower extremities    Past Surgical History:  Procedure Laterality Date   CARDIOVASCULAR STRESS TEST  2006; 04/27/10; 10/2017   Normal stress nuclear study, normal EF.  2019: EF 40%, inferior ischemia--intermediate risk study-->Dr. Einar Gip to  do cath.   COLONOSCOPY  x 3   Most recent-->12/19/17 (after + cologuard) multiple adenomatous polyps-->recall 3 yrs.   LEFT HEART CATH AND CORONARY ANGIOGRAPHY N/A 01/06/2018   One area of 80% ostial stenosis--small and hard to visualize. No intervention.  DAPT for >1 yr recommended.  Procedure: LEFT HEART CATH AND CORONARY ANGIOGRAPHY;  Surgeon: Adrian Prows, MD;  Location: [facility];  Service: Cardiovascular;  Laterality: N/A;   NASAL SINUS SURGERY     Sleep study  07/2016   OSA; CPAP trial at 10 cm H20 recommended by pulm (Dr. Tennis Must Dios-- pulm).   [place]   ?  Discectomy   TEE WITHOUT CARDIOVERSION  05/2010   Dr. Einar Gip; severe LVH, no valvular abnormalities   TRANSESOPHAGEAL ECHOCARDIOGRAM  06/06/10; 12/2011; 09/2014; 11/2016   LVOT obst with 135 mm Hg PG. HOCM. No AV obstruction to flow. No mitral regurg..  Repeat echo 01/09/12 no change.  2016: EF 60%, severe conc LV hypertrophy, normal global wall motion, LA mildly dilated, systolic anterior motion of mitral valve chord, mild MR, mild TR, mild pulm HTN--no signif change since 2013.  Repeat echo 11/2016 and 10/2017 no signif change compared to prior studies.   TRANSTHORACIC ECHOCARDIOGRAM  05/28/2010; 10/2017   2012; Normal LVEF, LVOT obst, lateral wall hypokin.  10/2017: EF 55%, hypertrophic CM, grd II dd, normal global wall motion. 02/2022 no change except now with severe LA dilation   Family History  Problem Relation Age of Onset   Diabetes Mother    Prostate cancer Father        Prostate   Breast cancer Sister    Diabetes Maternal Aunt    Breast cancer Maternal Aunt    Hyperlipidemia Sister    Hypertension Neg Hx    Coronary artery disease Neg Hx    Colon cancer Neg Hx    Stomach cancer Neg Hx    Rectal cancer Neg Hx    Esophageal cancer Neg Hx    Social History   Socioeconomic History   Marital status: Married    Spouse name: Mechele Claude   Number of children: 1   Years of education: Not on file   Highest  education level: Not on file  Occupational History   Occupation: retired    Fish farm manager: RETIRED  Tobacco Use   Smoking status: Former    Packs/day: 0.50    Years: 40.00    Total pack years: 20.00    Types: Cigarettes, Cigars    Quit date: 04/29/1998    Years since quitting: 24.0   Smokeless tobacco: Never   Tobacco comments:    4 cigars  Vaping Use   Vaping Use: Never used  Substance and Sexual Activity   Alcohol use: Yes    Comment: occasionally   Drug use: No   Sexual activity: Not on file  Other Topics Concern   Not on file  Social History Narrative   Married, 1 son, 1 granddaughter.   Retired from [employer] 2000.   Hobbies: trading farm equipment.  Used to be a Environmental manager.   Tobacco x many years, quit @ 2005   No ETOH or drugs.   No regular exercise.Smoking Status:  quit            Social Determinants of Health   Financial Resource Strain: Low Risk  (05/08/2022)   Overall Financial Resource Strain (CARDIA)    Difficulty of Paying Living Expenses: Not very hard  Food Insecurity: No Food Insecurity (05/08/2022)   Hunger Vital Sign    Worried About Running Out of Food in the Last Year: Never true    Ran Out of Food in the Last Year: Never true  Transportation Needs: No Transportation Needs (05/08/2022)   PRAPARE - Hydrologist (Medical): No    Lack of Transportation (Non-Medical): No  Physical Activity: Insufficiently Active (05/08/2022)   Exercise Vital Sign    Days of Exercise per Week: 7 days    Minutes of Exercise per Session: 20 min  Stress: No Stress Concern Present (05/08/2022)   [place]  Questionnaire    Feeling of Stress : Not at all  Social Connections: Socially Integrated (05/08/2022)   Social Connection and Isolation Panel [NHANES]    Frequency of Communication with Friends and Family: More than three times a week    Frequency of Social  Gatherings with Friends and Family: More than three times a week    Attends Religious Services: 1 to 4 times per year    Active Member of [employer] or Organizations: Yes    Attends Archivist Meetings: 1 to 4 times per year    Marital Status: Married    Tobacco Counseling Counseling given: Not Answered Tobacco comments: 4 cigars   Clinical Intake:  Pre-visit preparation completed: Yes  Pain : No/denies pain     Nutritional Risks: None Diabetes: Yes CBG done?: No Did pt. bring in CBG monitor from home?: No     Diabetic?Yes Nutrition Risk Assessment:  Has the patient had any N/V/D within the last 2 months?  No  Does the patient have any non-healing wounds?  No  Has the patient had any unintentional weight loss or weight gain?  No   Diabetes:  Is the patient diabetic?  Yes  If diabetic, was a CBG obtained today?  No  Did the patient bring in their glucometer from home?  No  How often do you monitor your CBG's? Occasionally.   Financial Strains and Diabetes Management:  Are you having any financial strains with the device, your supplies or your medication? No .  Does the patient want to be seen by Chronic Care Management for management of their diabetes?  No  Would the patient like to be referred to a Nutritionist or for Diabetic Management?  No   Diabetic Exams:  Diabetic Eye Exam: Completed 08/2021 Diabetic Foot Exam: Overdue, Pt has been advised about the importance in completing this exam. Pt is scheduled for diabetic foot exam on N/A.   Interpreter Needed?: No  Information entered by :: Edgar Frisk, [place]   Activities of Daily Living    05/08/2022    3:16 PM  In your present state of health, do you have any difficulty performing the following activities:  Hearing? 0  Vision? 0  Difficulty concentrating or making decisions? 0  Walking or climbing stairs? 0  Dressing or bathing? 0  Preparing Food and eating ? N  Using the Toilet? N  In the past  six months, have you accidently leaked urine? N  Do you have problems with loss of bowel control? N  Managing your Medications? N  Managing your Finances? N  Housekeeping or managing your Housekeeping? N    Patient Care Team: Tammi Sou, MD as PCP - General Adrian Prows, MD as Consulting Physician (Cardiology) Chesley Mires, MD as Consulting Physician (Pulmonary Disease) Izora Gala, MD as Consulting Physician (Otolaryngology) Rutherford Guys, MD as Consulting Physician (Ophthalmology) Inocencio Homes, DPM as Consulting Physician (Podiatry) Rigoberto Noel, MD as Consulting Physician (Pulmonary Disease) de Lyons, Mike Gip, MD (Inactive) as Consulting Physician (Pulmonary Disease) Gatha Mayer, MD as Consulting Physician (Gastroenterology) Darreld Mclean, MD as Referring Physician (Dermatology) Lavonna Monarch, MD (Inactive) as Consulting Physician (Dermatology)  Indicate any recent Medical Services you may have received from other than [facility] providers in the past year (date may be approximate).     Assessment:   This is a routine wellness examination for Adith.  Hearing/Vision screen No results found.  Dietary issues and exercise activities discussed: Current Exercise Habits: Structured  exercise class, Time (Minutes): 30, Frequency (Times/Week): 7, Weekly Exercise (Minutes/Week): 210   Goals Addressed   None   Depression Screen    05/08/2022    3:14 PM 04/15/2022    8:16 AM 05/02/2021    1:05 PM 02/16/2021    9:00 AM 03/15/2020    1:18 PM 03/05/2019    8:04 AM 03/06/2018    9:01 AM  PHQ 2/9 Scores  PHQ - 2 Score 0 0 0 0 0 0 0    Fall Risk    05/08/2022    3:15 PM 05/02/2021    1:08 PM 02/16/2021    8:59 AM 03/15/2020    1:18 PM 03/05/2019    8:04 AM  [place] in the past year? 1 0 1 0 1  Number falls in past yr: 0 0 0 0 0  Injury with Fall? 0 0 0 0 1  Follow up Falls evaluation completed Falls prevention discussed Falls evaluation completed Falls  prevention discussed Falls evaluation completed    FALL RISK PREVENTION PERTAINING TO THE HOME:  Any stairs in or around the home? Yes  If so, are there any without handrails? No  Home free of loose throw rugs in walkways, pet beds, electrical cords, etc? Yes  Adequate lighting in your home to reduce risk of falls? Yes   ASSISTIVE DEVICES UTILIZED TO PREVENT FALLS:  Life alert? No  Use of a cane, walker or w/c? No  Grab bars in the bathroom? Yes  Shower chair or bench in shower? No  Elevated toilet seat or a handicapped toilet? No   TIMED UP AND GO:  Was the test performed? Yes .  Length of time to ambulate 10 feet: 0 sec.     Cognitive Function:    03/06/2018    9:07 AM  MMSE - Mini Mental State Exam  Orientation to time 5  Orientation to Place 5  Registration 3  Attention/ Calculation 5  Recall 2  Language- name 2 objects 2  Language- repeat 1  Language- follow 3 step command 3  Language- read & follow direction 1  Write a sentence 1  Copy design 1  Total score 29        05/08/2022    3:17 PM 05/02/2021    1:09 PM  6CIT Screen  What Year? 0 points 0 points  What month? 0 points 0 points  What time? 0 points 0 points  Count back from 20 0 points 0 points  Months in reverse 0 points 4 points  Repeat phrase 0 points 2 points  Total Score 0 points 6 points    Immunizations Immunization History  Administered Date(s) Administered   Fluad Quad(high Dose 65+) 01/25/2020, 01/26/2021, 02/08/2022   Hepatitis B 06/08/2012, 07/10/2012   Hepatitis B, adult 11/30/2012   Influenza Split 02/26/2011, 01/16/2012   Influenza Whole 12/28/2009   Influenza, High Dose Seasonal PF 01/24/2016, 12/29/2016, 12/19/2018   Influenza,inj,Quad PF,6+ Mos 02/05/2013, 02/02/2014, 01/24/2016   Influenza-Unspecified 02/04/2015, 01/16/2018, 01/04/2020, 01/27/2022   Moderna SARS-COV2 Booster Vaccination 01/18/2021   Moderna Sars-Covid-2 Vaccination 05/20/2019, 06/25/2019, 02/25/2020    Pneumococcal Conjugate-13 08/03/2013   Pneumococcal Polysaccharide-23 06/06/2011, 01/21/2012, 02/28/2017   Td 04/30/2007   Tdap 03/12/2018   Zoster Recombinat (Shingrix) 03/12/2018, 05/12/2018   Zoster, Live 12/03/2011    TDAP status: Up to date  Flu Vaccine status: Up to date  Pneumococcal vaccine status: Up to date  Covid-19 vaccine status: Information provided on how to obtain vaccines.  Qualifies for Shingles Vaccine? Yes   Zostavax completed  N/A   Shingrix Completed?: Yes  Screening Tests Health Maintenance  Topic Date Due   COLONOSCOPY (Pts 45-73yr Insurance coverage will need to be confirmed)  12/19/2020   FOOT EXAM  08/07/2021   COVID-19 Vaccine (4 - 2023-24 season) 12/28/2021   HEMOGLOBIN A1C  01/04/2022   Diabetic kidney evaluation - Urine ACR  02/16/2022   OPHTHALMOLOGY EXAM  09/12/2022   Diabetic kidney evaluation - eGFR measurement  12/07/2022   Medicare Annual Wellness (AWV)  05/09/2023   DTaP/Tdap/Td (3 - Td or Tdap) 03/12/2028   Pneumonia Vaccine 76 Years old  Completed   INFLUENZA VACCINE  Completed   Hepatitis C Screening  Completed   Zoster Vaccines- Shingrix  Completed   HPV VACCINES  Aged Out    Health Maintenance  Health Maintenance Due  Topic Date Due   COLONOSCOPY (Pts 45-475yrInsurance coverage will need to be confirmed)  12/19/2020   FOOT EXAM  08/07/2021   COVID-19 Vaccine (4 - 2023-24 season) 12/28/2021   HEMOGLOBIN A1C  01/04/2022   Diabetic kidney evaluation - Urine ACR  02/16/2022    Colorectal cancer screening: Referral to GI placed NEEDED. Pt aware the office will call re: appt.Patient declined order.  Lung Cancer Screening: (Low Dose CT Chest recommended if Age 76-80ears, 30 pack-year currently smoking OR have quit w/in 15years.) does not qualify.   Lung Cancer Screening Referral: N/A   Additional Screening:  Hepatitis C Screening: does qualify; Completed 12/25/2012  Vision Screening: Recommended annual ophthalmology  exams for early detection of glaucoma and other disorders of the eye. Is the patient up to date with their annual eye exam?  Yes  Who is the provider or what is the name of the office in which the patient attends annual eye exams?Dr. ShGershon Cranef pt is not established with a provider, would they like to be referred to a provider to establish care?  N/A  .   Dental Screening: Recommended annual dental exams for proper oral hygiene  Community Resource Referral / Chronic Care Management: CRR required this visit?  No   CCM required this visit?  No      Plan:     I have personally reviewed and noted the following in the patient's chart:   Medical and social history Use of alcohol, tobacco or illicit drugs  Current medications and supplements including opioid prescriptions. Patient is not currently taking opioid prescriptions. Functional ability and status Nutritional status Physical activity Advanced directives List of other physicians Hospitalizations, surgeries, and ER visits in previous 12 months Vitals Screenings to include cognitive, depression, and falls Referrals and appointments  In addition, I have reviewed and discussed with patient certain preventive protocols, quality metrics, and best practice recommendations. A written personalized care plan for preventive services as well as general preventive health recommendations were provided to patient.     GeEdgar FriskCMIcon [facility] 05/08/2022   Nurse Notes: Non Face to Face 30 Minute visit.    Mr. JaHubert Thank you for taking time to come for your Medicare Wellness Visit. I appreciate your ongoing commitment to your health goals. Please review the following plan we discussed and let me know if I can assist you in the future.   These are the goals we discussed:  Goals      Patient Stated     Get off some medications      Weight (lb) < 265 lb (120.2  kg)     Lose weight by watching food intake.         This is a list of  the screening recommended for you and due dates:  Health Maintenance  Topic Date Due   Colon Cancer Screening  12/19/2020   Complete foot exam   08/07/2021   COVID-19 Vaccine (4 - 2023-24 season) 12/28/2021   Hemoglobin A1C  01/04/2022   Yearly kidney health urinalysis for diabetes  02/16/2022   Eye exam for diabetics  09/12/2022   Yearly kidney function blood test for diabetes  12/07/2022   Medicare Annual Wellness Visit  05/09/2023   DTaP/Tdap/Td vaccine (3 - Td or Tdap) 03/12/2028   Pneumonia Vaccine  Completed   Flu Shot  Completed   Hepatitis C Screening: USPSTF Recommendation to screen - Ages 22-79 yo.  Completed   Zoster (Shingles) Vaccine  Completed   HPV Vaccine  Aged Out

## 2022-05-22 ENCOUNTER — Other Ambulatory Visit: Payer: Self-pay

## 2022-05-22 DIAGNOSIS — F5101 Primary insomnia: Secondary | ICD-10-CM

## 2022-05-23 NOTE — Telephone Encounter (Signed)
Is this okay to fill? 

## 2022-05-27 ENCOUNTER — Other Ambulatory Visit: Payer: Self-pay | Admitting: Family Medicine

## 2022-06-01 ENCOUNTER — Other Ambulatory Visit: Payer: Self-pay | Admitting: Family Medicine

## 2022-06-02 ENCOUNTER — Other Ambulatory Visit: Payer: Self-pay | Admitting: Family Medicine

## 2022-06-17 ENCOUNTER — Other Ambulatory Visit: Payer: Self-pay | Admitting: Family Medicine

## 2022-06-18 ENCOUNTER — Other Ambulatory Visit: Payer: Self-pay | Admitting: Family Medicine

## 2022-06-21 ENCOUNTER — Other Ambulatory Visit: Payer: Self-pay | Admitting: Family Medicine

## 2022-07-06 DIAGNOSIS — M1711 Unilateral primary osteoarthritis, right knee: Secondary | ICD-10-CM | POA: Diagnosis not present

## 2022-07-22 NOTE — Progress Notes (Signed)
Office Note 07/23/2022  CC:  Chief Complaint  Patient presents with   Medical Management of Chronic Issues    Pt is fasting    HPI:  Patient is a 77 y.o. male who is here for annual health maintenance exam and follow-up diabetes, hypertension, and hyperlipidemia.  Doing well. Compliant with medications, feels like he adheres to a pretty good diet. Denies any burning, tingling, or numbness in feet.  Past Medical History:  Diagnosis Date   Allergy    CAD (coronary artery disease)    see PSH section for details   Carotid bruit 05/28/10   Tortuosity of carotids on doppler u/s, no stenosis.   Cataract    Chronic diastolic heart failure (HCC)    Grd II DD   Colon cancer screening    cologuard POSITIVE 09/2017-->colonoscopy 12/19/17; multiple adenomatous polyps, recall 3 yrs.   COPD (chronic obstructive pulmonary disease) (HCC)    Diabetes mellitus    type 2- non insulin-requiring   DOE (dyspnea on exertion)    Cardiology w/u unrevealing except LVOT obstruction: suspect dyspnea due to HOCM, morbid obesity and OSA--stable as of 02/2017 cardiol f/u.   Fatty liver u/s and CT 2009   w/mildly elevated transaminases; u/s 12/2012 reconfirmed dx.  Hep B and C testing negative.   GERD (gastroesophageal reflux disease)    EDG 04-03-01, + distal esophageal stricture dilation   Gout    Heart murmur    Hiatal hernia    History of iron deficiency anemia 02/2019   Hb 12.9 02/2019->home hemoccults ordered but pt never returned these.  GI referral was done but pt did not return their call/unable to be contacted. Fall 2021 iron low but Hb normal->iron supp brought iron levels up (hemoccults neg x 3).   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 Grd II DD.     Moderate persistent asthma 04/19/2014   Morbid obesity (HCC)    OSA on CPAP    Could not tolerate CPAP, not surgical candidate per Dr. Benjamine Mola.  Restarted CPAP (auto titrate 5-15 with full facemask--Dr. Alva 2017.  Getting mask fitting/leak issues worked out as of 07/2016 pulm f/u.   Osteoarthritis, multiple sites    primarily knees and back.   Stricture and stenosis of esophagus    Venous insufficiency of both lower extremities     Past Surgical History:  Procedure Laterality Date   CARDIOVASCULAR STRESS TEST  2006; 04/27/10; 10/2017   Normal stress nuclear study, normal EF.  2019: EF 40%, inferior ischemia--intermediate risk study-->Dr. Einar Gip to do cath.   COLONOSCOPY  x 3   Most recent-->12/19/17 (after + cologuard) multiple adenomatous polyps-->recall 3 yrs.   LEFT HEART CATH AND CORONARY ANGIOGRAPHY N/A 01/06/2018   One area of 80% ostial stenosis--small and hard to visualize. No intervention.  DAPT for >1 yr recommended.  Procedure: LEFT HEART CATH AND CORONARY ANGIOGRAPHY;  Surgeon: Adrian Prows, MD;  Location: [facility];  Service: Cardiovascular;  Laterality: N/A;   NASAL SINUS SURGERY     Sleep study  07/2016   OSA; CPAP trial at 10 cm H20 recommended by pulm (Dr. Tennis Must Dios--[place] pulm).   [place]   ? Discectomy  TEE WITHOUT CARDIOVERSION  05/2010   Dr. Einar Gip; severe LVH, no valvular abnormalities   TRANSESOPHAGEAL ECHOCARDIOGRAM  06/06/10; 12/2011; 09/2014; 11/2016   LVOT obst with 135 mm Hg PG. HOCM. No AV obstruction to flow. No mitral regurg..  Repeat echo 01/09/12 no change.  2016: EF 60%, severe conc LV hypertrophy, normal global wall motion, LA mildly dilated, systolic anterior motion of mitral valve chord, mild MR, mild TR, mild pulm HTN--no signif change since 2013.  Repeat echo 11/2016 and 10/2017 no signif change compared to prior studies.   TRANSTHORACIC ECHOCARDIOGRAM  05/28/2010; 10/2017   2012; Normal LVEF, LVOT  obst, lateral wall hypokin.  10/2017: EF 55%, hypertrophic CM, grd II dd, normal global wall motion. 02/2022 no change except now with severe LA dilation    Family History  Problem Relation Age of Onset   Diabetes Mother    Prostate cancer Father        Prostate   Breast cancer Sister    Diabetes Maternal Aunt    Breast cancer Maternal Aunt    Hyperlipidemia Sister    Hypertension Neg Hx    Coronary artery disease Neg Hx    Colon cancer Neg Hx    Stomach cancer Neg Hx    Rectal cancer Neg Hx    Esophageal cancer Neg Hx     Social History   Socioeconomic History   Marital status: Married    Spouse name: Mechele Claude   Number of children: 1   Years of education: Not on file   Highest education level: Not on file  Occupational History   Occupation: retired    Fish farm manager: RETIRED  Tobacco Use   Smoking status: Former    Packs/day: 0.50    Years: 40.00    Additional pack years: 0.00    Total pack years: 20.00    Types: Cigarettes, Cigars    Quit date: 04/29/1998    Years since quitting: 24.2   Smokeless tobacco: Never   Tobacco comments:    4 cigars  Vaping Use   Vaping Use: Never used  Substance and Sexual Activity   Alcohol use: Yes    Comment: occasionally   Drug use: No   Sexual activity: Not on file  Other Topics Concern   Not on file  Social History Narrative   Married, 1 son, 1 granddaughter.   Retired from [employer] 2000.   Hobbies: trading farm equipment.  Used to be a Environmental manager.   Tobacco x many years, quit @ 2005   No ETOH or drugs.   No regular exercise.Smoking Status:  quit            Social Determinants of Health   Financial Resource Strain: Low Risk  (05/08/2022)   Overall Financial Resource Strain (CARDIA)    Difficulty of Paying Living Expenses: Not very hard  Food Insecurity: No Food Insecurity (05/08/2022)   Hunger Vital Sign    Worried About Running Out of Food in the Last Year: Never true    Ran Out of Food in  the Last Year: Never true  Transportation Needs: No Transportation Needs (05/08/2022)   PRAPARE - Hydrologist (Medical): No    Lack of Transportation (Non-Medical): No  Physical Activity: Insufficiently Active (05/08/2022)   Exercise Vital Sign    Days of Exercise per Week: 7 days    Minutes of Exercise per Session: 20 min  Stress: No Stress Concern Present (05/08/2022)   [place]  [facility]    Feeling of Stress : Not at all  Social Connections: Socially Integrated (05/08/2022)   Social Connection and Isolation Panel [NHANES]    Frequency of Communication with Friends and Family: More than three times a week    Frequency of Social Gatherings with Friends and Family: More than three times a week    Attends Religious Services: 1 to 4 times per year    Active Member of [employer] or Organizations: Yes    Attends Archivist Meetings: 1 to 4 times per year    Marital Status: Married  Human resources officer Violence: Not At Risk (05/08/2022)   Humiliation, Afraid, Rape, and Kick questionnaire    Fear of Current or Ex-Partner: No    Emotionally Abused: No    Physically Abused: No    Sexually Abused: No    Outpatient Medications Prior to Visit  Medication Sig Dispense Refill   aspirin 81 MG tablet Take 81 mg by mouth daily.     Cholecalciferol (VITAMIN D) 2000 UNITS tablet Take 2,000 Units by mouth daily.     diclofenac Sodium (VOLTAREN) 1 % GEL Apply 2 g topically 4 (four) times daily. 100 g 3   docusate sodium (COLACE) 100 MG capsule Take 100 mg by mouth every other day.     empagliflozin (JARDIANCE) 25 MG TABS tablet Take 1 tablet (25 mg total) by mouth daily before breakfast. 100 tablet 2   fluticasone (FLONASE) 50 MCG/ACT nasal spray USE 2 SPRAYS IN BOTH NOSTRILS  DAILY AS NEEDED FOR ALLERGIES 64 g 1   glucose blood (ONE TOUCH ULTRA TEST) test strip Use as instructed to check blood sugar.  DX 250.00 100  each prn   metoprolol tartrate (LOPRESSOR) 50 MG tablet TAKE 1 TABLET BY MOUTH TWICE  DAILY 200 tablet 2   pantoprazole (PROTONIX) 40 MG tablet TAKE 1 TABLET BY MOUTH DAILY 90 tablet 1   rosuvastatin (CRESTOR) 20 MG tablet TAKE 1 TABLET BY MOUTH AT  BEDTIME 30 tablet 0   traZODone (DESYREL) 50 MG tablet TAKE 1 TABLET(50 MG) BY MOUTH AT BEDTIME 30 tablet 1   verapamil (CALAN-SR) 120 MG CR tablet TAKE 1 TABLET BY MOUTH IN THE  MORNING 100 tablet 2   fluticasone (FLOVENT HFA) 110 MCG/ACT inhaler Inhale 2 puffs into the lungs 2 (two) times daily. 1 each 1   albuterol (PROAIR HFA) 108 (90 Base) MCG/ACT inhaler INHALE 2 PUFFS INTO THE LUNGS EVERY 4 HOURS AS NEEDED FOR WHEEZING (Patient not taking: Reported on 07/23/2022) 8.5 g 0   ondansetron (ZOFRAN) 8 MG tablet Take 1 tablet (8 mg total) by mouth every 8 (eight) hours as needed for nausea or vomiting. (Patient not taking: Reported on 05/08/2022) 20 tablet 0   ONE TOUCH LANCETS MISC Use as directed to check blood sugar.  DX 250.00 (Patient not taking: Reported on 07/23/2022) 200 each prn   allopurinol (ZYLOPRIM) 300 MG tablet TAKE 1 TABLET BY MOUTH TWICE  DAILY 60 tablet 0   glipiZIDE (GLUCOTROL XL) 10 MG 24 hr tablet TAKE 1 TABLET BY MOUTH DAILY 30 tablet 0   lisinopril (ZESTRIL) 10 MG tablet TAKE 1 TABLET(10 MG) BY MOUTH DAILY 30 tablet 0   metFORMIN (GLUCOPHAGE) 1000 MG tablet TAKE 1 TABLET BY MOUTH TWICE  DAILY WITH MEALS 30 tablet 0   predniSONE (DELTASONE) 20 MG tablet 3 tabs po qd x 2d then 2 tabs po qd x 5d, then 1 tab po  qd x 5d (Patient not taking: Reported on 07/23/2022) 21 tablet 0   No facility-administered medications prior to visit.    No Known Allergies  Review of Systems  Constitutional:  Negative for appetite change, chills, fatigue and fever.  HENT:  Negative for congestion, dental problem, ear pain and sore throat.   Eyes:  Negative for discharge, redness and visual disturbance.  Respiratory:  Negative for cough, chest tightness,  shortness of breath and wheezing.   Cardiovascular:  Negative for chest pain, palpitations and leg swelling.  Gastrointestinal:  Negative for abdominal pain, blood in stool, diarrhea, nausea and vomiting.  Genitourinary:  Negative for difficulty urinating, dysuria, flank pain, frequency, hematuria and urgency.  Musculoskeletal:  Negative for arthralgias, back pain, joint swelling, myalgias and neck stiffness.  Skin:  Negative for pallor and rash.  Neurological:  Negative for dizziness, speech difficulty, weakness and headaches.  Hematological:  Negative for adenopathy. Does not bruise/bleed easily.  Psychiatric/Behavioral:  Negative for confusion and sleep disturbance. The patient is not nervous/anxious.     PE;    07/23/2022    8:33 AM 04/26/2022    3:32 PM 04/15/2022    8:16 AM  Vitals with BMI  Height 5\' 8"  5\' 8"  5\' 8"   Weight 255 lbs 6 oz 253 lbs 13 oz 251 lbs  BMI 38.84 [id number]  Systolic 0000000 0000000 [id number]  Diastolic 86 67 65  Pulse 81 83 62   Gen: Alert, well appearing.  Patient is oriented to person, place, time, and situation. AFFECT: pleasant, lucid thought and speech. ENT: Ears: EACs clear, normal epithelium.  TMs with good light reflex and landmarks bilaterally.  Eyes: no injection, icteris, swelling, or exudate.  EOMI, PERRLA. Nose: no drainage or turbinate edema/swelling.  No injection or focal lesion.  Mouth: lips without lesion/swelling.  Oral mucosa pink and moist.  Dentition intact and without obvious caries or gingival swelling.  Oropharynx without erythema, exudate, or swelling.  Neck: supple/nontender.  No LAD, mass, or TM.  Carotid pulses 2+ bilaterally, without bruits. CV: RRR, no m/r/g.   LUNGS: CTA bilat, nonlabored resps, good aeration in all lung fields. ABD: soft, NT, ND, BS normal.  No hepatospenomegaly or mass.  No bruits. EXT: no clubbing, cyanosis, or edema.  Musculoskeletal: no joint swelling, erythema, warmth, or tenderness.  ROM of all joints  intact. Skin - no sores or suspicious lesions or rashes or color changes Foot exam -  no swelling, tenderness or skin or vascular lesions. Color and temperature is normal. Sensation is intact. Peripheral pulses are palpable. Toenails are normal.  Pertinent labs:  Lab Results  Component Value Date   TSH 2.34 02/16/2021   Lab Results  Component Value Date   WBC 7.4 12/06/2021   HGB 18.0 Called to [facility] 12/06/2021   HCT 54.6 (H) 12/06/2021   MCV 93.2 12/06/2021   PLT 120.0 (L) 12/06/2021   Lab Results  Component Value Date   IRON 93 05/26/2020   TIBC 353 05/26/2020   FERRITIN 44 05/26/2020   Lab Results  Component Value Date   CREATININE 1.17 12/06/2021   BUN 19 12/06/2021   NA 136 12/06/2021   K 4.1 12/06/2021   CL 102 12/06/2021   CO2 25 12/06/2021   Lab Results  Component Value Date   ALT 21 12/06/2021   AST 27 12/06/2021   ALKPHOS 65 12/06/2021   BILITOT 1.3 (H) 12/06/2021   Lab Results  Component Value Date   CHOL 103 07/04/2021  Lab Results  Component Value Date   HDL 37.50 (L) 07/04/2021   Lab Results  Component Value Date   LDLCALC 42 07/04/2021   Lab Results  Component Value Date   TRIG 116.0 07/04/2021   Lab Results  Component Value Date   CHOLHDL 3 07/04/2021   Lab Results  Component Value Date   PSA 0.81 02/28/2017   PSA 0.70 11/28/2015   PSA 0.71 04/19/2014   Lab Results  Component Value Date   HGBA1C 6.7 (A) 07/23/2022   HGBA1C 6.7 07/23/2022   HGBA1C 6.7 (A) 07/23/2022   HGBA1C 6.7 07/23/2022   Lab Results  Component Value Date   LABURIC 5.4 11/20/2016    ASSESSMENT AND PLAN:   #1 health maintenance exam: Reviewed age and gender appropriate health maintenance issues (prudent diet, regular exercise, health risks of tobacco and excessive alcohol, use of seatbelts, fire alarms in home, use of sunscreen).  Also reviewed age and gender appropriate health screening as well as vaccine recommendations. Vaccines: all  UTD. Labs: cbc,cmet,lipids, Hba1c, urine microalb/cr. Prostate ca screening: Patient elected to stop routine screening in 2020.   Colon ca screening: had + cologuard followed by a colonoscopy that showed adenomatous polyps 2019.     #2 diabetes without complication.  Good control. POC Hba1c today is 6.7%. Feet exam normal today Urine microalbumin/creatinine today. Continue metformin 1000 mg twice a day, Jardiance 25 mg twice a day, and glipizide XL 10 mg daily.  3.  Hypertension, doing well on lisinopril 10 mg a day and Lopressor 50 mg twice a day. Electrolytes and creatinine today.  4.  Hypercholesterolemia. Doing well on Crestor 20 mg a day. Lipid panel and hepatic panel today.  Erythrocytosis, chronic.  Suspect due to inadequately treated obstructive sleep apnea. Hemoglobin was 18 at last check 7 months ago. Will recheck CBC today.  After Visit Summary was printed and given to the patient.  FOLLOW UP:  Return in about 3 months (around 10/23/2022) for routine chronic illness f/u.  Signed:  Crissie Sickles, MD           07/23/2022

## 2022-07-23 ENCOUNTER — Telehealth: Payer: Self-pay

## 2022-07-23 ENCOUNTER — Encounter: Payer: Self-pay | Admitting: Family Medicine

## 2022-07-23 ENCOUNTER — Ambulatory Visit (INDEPENDENT_AMBULATORY_CARE_PROVIDER_SITE_OTHER): Payer: Medicare Other | Admitting: Family Medicine

## 2022-07-23 VITALS — BP 125/86 | HR 81 | Temp 98.0°F | Ht 68.0 in | Wt 255.4 lb

## 2022-07-23 DIAGNOSIS — D751 Secondary polycythemia: Secondary | ICD-10-CM

## 2022-07-23 DIAGNOSIS — E7800 Pure hypercholesterolemia, unspecified: Secondary | ICD-10-CM | POA: Diagnosis not present

## 2022-07-23 DIAGNOSIS — Z0000 Encounter for general adult medical examination without abnormal findings: Secondary | ICD-10-CM | POA: Diagnosis not present

## 2022-07-23 DIAGNOSIS — E119 Type 2 diabetes mellitus without complications: Secondary | ICD-10-CM | POA: Diagnosis not present

## 2022-07-23 DIAGNOSIS — I10 Essential (primary) hypertension: Secondary | ICD-10-CM

## 2022-07-23 LAB — COMPREHENSIVE METABOLIC PANEL
ALT: 28 U/L (ref 0–53)
AST: 28 U/L (ref 0–37)
Albumin: 4.7 g/dL (ref 3.5–5.2)
Alkaline Phosphatase: 62 U/L (ref 39–117)
BUN: 15 mg/dL (ref 6–23)
CO2: 29 mEq/L (ref 19–32)
Calcium: 10.2 mg/dL (ref 8.4–10.5)
Chloride: 106 mEq/L (ref 96–112)
Creatinine, Ser: 1.07 mg/dL (ref 0.40–1.50)
GFR: 67.91 mL/min (ref >60.00)
Glucose, Bld: 114 mg/dL — ABNORMAL HIGH (ref 70–99)
Potassium: 4.3 mEq/L (ref 3.5–5.1)
Sodium: 143 mEq/L (ref 135–145)
Total Bilirubin: 0.9 mg/dL (ref 0.2–1.2)
Total Protein: 6.9 g/dL (ref 6.0–8.3)

## 2022-07-23 LAB — CBC
HCT: 54.2 % — ABNORMAL HIGH (ref 39.0–52.0)
Hemoglobin: 18.2 g/dL (ref 13.0–17.0)
MCHC: 33.5 g/dL (ref 30.0–36.0)
MCV: 93 fl (ref 78.0–100.0)
Platelets: 155 10*3/uL (ref 150.0–400.0)
RBC: 5.83 Mil/uL — ABNORMAL HIGH (ref 4.22–5.81)
RDW: 15.3 % (ref 11.5–15.5)
WBC: 9.4 10*3/uL (ref 4.0–10.5)

## 2022-07-23 LAB — POCT GLYCOSYLATED HEMOGLOBIN (HGB A1C)
HbA1c POC (<> result, manual entry): 6.7 % (ref 4.0–5.6)
HbA1c, POC (controlled diabetic range): 6.7 % (ref 0.0–7.0)
HbA1c, POC (prediabetic range): 6.7 % — AB (ref 5.7–6.4)
Hemoglobin A1C: 6.7 % — AB (ref 4.0–5.6)

## 2022-07-23 LAB — LIPID PANEL
Cholesterol: 175 mg/dL (ref 0–200)
HDL: 48.8 mg/dL (ref >39.00)
LDL Cholesterol: 98 mg/dL (ref 0–99)
NonHDL: 126.6
Total CHOL/HDL Ratio: 4
Triglycerides: 145 mg/dL (ref 0.0–149.0)
VLDL: 29 mg/dL (ref 0.0–40.0)

## 2022-07-23 LAB — MICROALBUMIN / CREATININE URINE RATIO
Creatinine,U: 81.6 mg/dL
Microalb Creat Ratio: 8.4 mg/g (ref 0.0–30.0)
Microalb, Ur: 6.9 mg/dL — ABNORMAL HIGH (ref 0.0–1.9)

## 2022-07-23 MED ORDER — ALLOPURINOL 300 MG PO TABS: 300.0000 mg | ORAL_TABLET | Freq: Two times a day (BID) | ORAL | 1 refills | Status: DC

## 2022-07-23 MED ORDER — METFORMIN HCL 1000 MG PO TABS: 1000.0000 mg | ORAL_TABLET | Freq: Two times a day (BID) | ORAL | 1 refills | Status: DC

## 2022-07-23 MED ORDER — LISINOPRIL 10 MG PO TABS: ORAL_TABLET | ORAL | 1 refills | Status: DC

## 2022-07-23 MED ORDER — FLUTICASONE PROPIONATE HFA 110 MCG/ACT IN AERO: 2.0000 | INHALATION_SPRAY | Freq: Two times a day (BID) | RESPIRATORY_TRACT | 1 refills | Status: DC

## 2022-07-23 MED ORDER — GLIPIZIDE ER 10 MG PO TB24: 10.0000 mg | ORAL_TABLET | Freq: Every day | ORAL | 1 refills | Status: DC

## 2022-07-23 NOTE — Patient Instructions (Signed)
Health Maintenance, Male Adopting a healthy lifestyle and getting preventive care are important in promoting health and wellness. Ask your health care provider about: The right schedule for you to have regular tests and exams. Things you can do on your own to prevent diseases and keep yourself healthy. What should I know about diet, weight, and exercise? Eat a healthy diet  Eat a diet that includes plenty of vegetables, fruits, low-fat dairy products, and lean protein. Do not eat a lot of foods that are high in solid fats, added sugars, or sodium. Maintain a healthy weight Body mass index (BMI) is a measurement that can be used to identify possible weight problems. It estimates body fat based on height and weight. Your health care provider can help determine your BMI and help you achieve or maintain a healthy weight. Get regular exercise Get regular exercise. This is one of the most important things you can do for your health. Most adults should: Exercise for at least 150 minutes each week. The exercise should increase your heart rate and make you sweat (moderate-intensity exercise). Do strengthening exercises at least twice a week. This is in addition to the moderate-intensity exercise. Spend less time sitting. Even light physical activity can be beneficial. Watch cholesterol and blood lipids Have your blood tested for lipids and cholesterol at 76 years of age, then have this test every 5 years. You may need to have your cholesterol levels checked more often if: Your lipid or cholesterol levels are high. You are older than 76 years of age. You are at high risk for heart disease. What should I know about cancer screening? Many types of cancers can be detected early and may often be prevented. Depending on your health history and family history, you may need to have cancer screening at various ages. This may include screening for: Colorectal cancer. Prostate cancer. Skin cancer. Lung  cancer. What should I know about heart disease, diabetes, and high blood pressure? Blood pressure and heart disease High blood pressure causes heart disease and increases the risk of stroke. This is more likely to develop in people who have high blood pressure readings or are overweight. Talk with your health care provider about your target blood pressure readings. Have your blood pressure checked: Every 3-5 years if you are 18-39 years of age. Every year if you are 40 years old or older. If you are between the ages of 65 and 75 and are a current or former smoker, ask your health care provider if you should have a one-time screening for abdominal aortic aneurysm (AAA). Diabetes Have regular diabetes screenings. This checks your fasting blood sugar level. Have the screening done: Once every three years after age 45 if you are at a normal weight and have a low risk for diabetes. More often and at a younger age if you are overweight or have a high risk for diabetes. What should I know about preventing infection? Hepatitis B If you have a higher risk for hepatitis B, you should be screened for this virus. Talk with your health care provider to find out if you are at risk for hepatitis B infection. Hepatitis C Blood testing is recommended for: Everyone born from 1945 through 1965. Anyone with known risk factors for hepatitis C. Sexually transmitted infections (STIs) You should be screened each year for STIs, including gonorrhea and chlamydia, if: You are sexually active and are younger than 76 years of age. You are older than 76 years of age and your   health care provider tells you that you are at risk for this type of infection. Your sexual activity has changed since you were last screened, and you are at increased risk for chlamydia or gonorrhea. Ask your health care provider if you are at risk. Ask your health care provider about whether you are at high risk for HIV. Your health care provider  may recommend a prescription medicine to help prevent HIV infection. If you choose to take medicine to prevent HIV, you should first get tested for HIV. You should then be tested every 3 months for as long as you are taking the medicine. Follow these instructions at home: Alcohol use Do not drink alcohol if your health care provider tells you not to drink. If you drink alcohol: Limit how much you have to 0-2 drinks a day. Know how much alcohol is in your drink. In the [place] one drink equals one 12 oz bottle of beer (355 mL), one 5 oz glass of wine (148 mL), or one 1 oz glass of hard liquor (44 mL). Lifestyle Do not use any products that contain nicotine or tobacco. These products include cigarettes, chewing tobacco, and vaping devices, such as e-cigarettes. If you need help quitting, ask your health care provider. Do not use street drugs. Do not share needles. Ask your health care provider for help if you need support or information about quitting drugs. General instructions Schedule regular health, dental, and eye exams. Stay current with your vaccines. Tell your health care provider if: You often feel depressed. You have ever been abused or do not feel safe at home. Summary Adopting a healthy lifestyle and getting preventive care are important in promoting health and wellness. Follow your health care provider's instructions about healthy diet, exercising, and getting tested or screened for diseases. Follow your health care provider's instructions on monitoring your cholesterol and blood pressure. This information is not intended to replace advice given to you by your health care provider. Make sure you discuss any questions you have with your health care provider. Document Revised: 09/04/2020 Document Reviewed: 09/04/2020 [employer] Patient Education  2023 [employer]  

## 2022-07-23 NOTE — Telephone Encounter (Signed)
CRITICAL VALUE STICKER  CRITICAL VALUE: Hemoglobin 18.2  RECEIVER (on-site recipient of call): Brysin Towery  DATE & TIME NOTIFIED: 1600  MESSENGER (representative from lab): karen  MD NOTIFIED:  TIME OF NOTIFICATION:  RESPONSE:

## 2022-07-24 ENCOUNTER — Telehealth: Payer: Self-pay

## 2022-07-24 MED ORDER — ARNUITY ELLIPTA 200 MCG/ACT IN AEPB: INHALATION_SPRAY | RESPIRATORY_TRACT | 3 refills | Status: DC

## 2022-07-24 NOTE — Telephone Encounter (Signed)
Donia Pounds prescription sent

## 2022-07-24 NOTE — Addendum Note (Signed)
Addended by: Tammi Sou on: 07/24/2022 12:18 PM   Modules accepted: Orders

## 2022-07-24 NOTE — Telephone Encounter (Signed)
Pt's plan does not cover Flovent previously sent on 3/26. Alternative meds: Arnuity or Qvar.  Please Advise

## 2022-07-24 NOTE — Telephone Encounter (Signed)
Pt's wife informed of med change.

## 2022-07-26 ENCOUNTER — Other Ambulatory Visit (HOSPITAL_COMMUNITY): Payer: Self-pay

## 2022-07-31 ENCOUNTER — Other Ambulatory Visit: Payer: Self-pay | Admitting: Family Medicine

## 2022-08-05 ENCOUNTER — Telehealth: Payer: Self-pay | Admitting: Family Medicine

## 2022-08-05 NOTE — Telephone Encounter (Signed)
Pt tested positive for Covid along with his wife. He would like to know if the medication can be called in without a virtual visit? Please contact the patient to discuss.

## 2022-08-05 NOTE — Telephone Encounter (Signed)
Spoke with pt's wife, pt has no fever but has been sweating excessively. Pt agreed to schedule virtual

## 2022-08-06 ENCOUNTER — Encounter: Payer: Self-pay | Admitting: Family Medicine

## 2022-08-06 ENCOUNTER — Telehealth (INDEPENDENT_AMBULATORY_CARE_PROVIDER_SITE_OTHER): Payer: Medicare Other | Admitting: Family Medicine

## 2022-08-06 VITALS — BP 124/80 | HR 85 | Temp 98.8°F

## 2022-08-06 DIAGNOSIS — U071 COVID-19: Secondary | ICD-10-CM | POA: Diagnosis not present

## 2022-08-06 DIAGNOSIS — J988 Other specified respiratory disorders: Secondary | ICD-10-CM | POA: Diagnosis not present

## 2022-08-06 MED ORDER — PAXLOVID (300/100) 20 X 150 MG & 10 X 100MG PO TBPK
3.0000 | ORAL_TABLET | Freq: Two times a day (BID) | ORAL | 0 refills | Status: AC
Start: 2022-08-06 — End: 2022-08-11

## 2022-08-06 NOTE — Addendum Note (Signed)
Addended by: Emi Holes D on: 08/06/2022 09:56 AM   Modules accepted: Orders

## 2022-08-06 NOTE — Progress Notes (Signed)
Virtual Visit via Video Note  I connected with Jack [name]  on 08/06/22 at  8:20 AM EDT by a video enabled telemedicine application and verified that I am speaking with the correct person using two identifiers.  Location patient: [place] Location provider:work or home office Persons participating in the virtual visit: patient, provider  I discussed the limitations and requested verbal permission for telemedicine visit. The patient expressed understanding and agreed to proceed.  CC: 77 year old male being seen today for respiratory symptoms.  HPI: Onset 3 to 4 days ago of nasal congestion/runny nose, scratchy throat, cough, night sweats.  Fatigued and has had a couple of loose stools.  No nausea or vomiting. No chest pain or shortness of breath.  No wheezing. Tested positive for COVID at home 2 days ago.  A subsequent test at home was also positive. Wife with COVID-positive illness right now as well.   ROS: See pertinent positives and negatives per HPI.  Past Medical History:  Diagnosis Date   Allergy    CAD (coronary artery disease)    see PSH section for details   Carotid bruit 05/28/10   Tortuosity of carotids on doppler u/s, no stenosis.   Cataract    Chronic diastolic heart failure    Grd II DD   Colon cancer screening    cologuard POSITIVE 09/2017-->colonoscopy 12/19/17; multiple adenomatous polyps, recall 3 yrs.   COPD (chronic obstructive pulmonary disease)    Diabetes mellitus    type 2- non insulin-requiring   DOE (dyspnea on exertion)    Cardiology w/u unrevealing except LVOT obstruction: suspect dyspnea due to HOCM, morbid obesity and OSA--stable as of 02/2017 cardiol f/u.   Fatty liver u/s and CT 2009   w/mildly elevated transaminases; u/s 12/2012 reconfirmed dx.  Hep B and C testing negative.   GERD (gastroesophageal reflux disease)    EDG 04-03-01, + distal esophageal stricture dilation   Gout    Heart murmur    Hiatal hernia    History of iron deficiency anemia 02/2019    Hb 12.9 02/2019->home hemoccults ordered but pt never returned these.  GI referral was done but pt did not return their call/unable to be contacted. Fall 2021 iron low but Hb normal->iron supp brought iron levels up (hemoccults neg x 3).   Hx of adenomatous colonic polyps 12/27/2017   Hyperlipidemia    Hypertension    severe LVH on echo   Hypertrophic obstructive cardiomyopathy (HOCM) 2012   LVOT obstruction stable on echo 01/09/12 and again on f/u echo 09/29/14, 11/2016, 10/2017--with evidence of HOCM due to chordal systolic anterior motion of MV leaflet. Grd II DD.     Moderate persistent asthma 04/19/2014   Morbid obesity    OSA on CPAP    Could not tolerate CPAP, not surgical candidate per Dr. Suszanne Conners.  Restarted CPAP (auto titrate 5-15 with full facemask--Dr. Vassie Loll 2017.  Getting mask fitting/leak issues worked out as of 07/2016 pulm f/u.   Osteoarthritis, multiple sites    primarily knees and back.   Stricture and stenosis of esophagus    Venous insufficiency of both lower extremities     Past Surgical History:  Procedure Laterality Date   CARDIOVASCULAR STRESS TEST  2006; 04/27/10; 10/2017   Normal stress nuclear study, normal EF.  2019: EF 40%, inferior ischemia--intermediate risk study-->Dr. Jacinto Halim to do cath.   COLONOSCOPY  x 3   Most recent-->12/19/17 (after + cologuard) multiple adenomatous polyps-->recall 3 yrs.   LEFT HEART CATH AND CORONARY ANGIOGRAPHY N/A 01/06/2018  One area of 80% ostial stenosis--small and hard to visualize. No intervention.  DAPT for >1 yr recommended.  Procedure: LEFT HEART CATH AND CORONARY ANGIOGRAPHY;  Surgeon: Yates DecampGanji, Jay, MD;  Location: [facility];  Service: Cardiovascular;  Laterality: N/A;   NASAL SINUS SURGERY     Sleep study  07/2016   OSA; CPAP trial at 10 cm H20 recommended by pulm (Dr. Tommi Rumpsde Dios--[place] [facility]).   SPINE SURGERY  1970   ? Discectomy   TEE WITHOUT CARDIOVERSION  05/2010   Dr. Jacinto HalimGanji; severe LVH, no valvular abnormalities    TRANSESOPHAGEAL ECHOCARDIOGRAM  06/06/10; 12/2011; 09/2014; 11/2016   LVOT obst with 135 mm Hg PG. HOCM. No AV obstruction to flow. No mitral regurg..  Repeat echo 01/09/12 no change.  2016: EF 60%, severe conc LV hypertrophy, normal global wall motion, LA mildly dilated, systolic anterior motion of mitral valve chord, mild MR, mild TR, mild pulm HTN--no signif change since 2013.  Repeat echo 11/2016 and 10/2017 no signif change compared to prior studies.   TRANSTHORACIC ECHOCARDIOGRAM  05/28/2010; 10/2017   2012; Normal LVEF, LVOT obst, lateral wall hypokin.  10/2017: EF 55%, hypertrophic CM, grd II dd, normal global wall motion. 02/2022 no change except now with severe LA dilation     Current Outpatient Medications:    allopurinol (ZYLOPRIM) 300 MG tablet, Take 1 tablet (300 mg total) by mouth 2 (two) times daily., Disp: 180 tablet, Rfl: 1   aspirin 81 MG tablet, Take 81 mg by mouth daily., Disp: , Rfl:    Cholecalciferol (VITAMIN D) 2000 UNITS tablet, Take 2,000 Units by mouth daily., Disp: , Rfl:    diclofenac Sodium (VOLTAREN) 1 % GEL, Apply 2 g topically 4 (four) times daily., Disp: 100 g, Rfl: 3   docusate sodium (COLACE) 100 MG capsule, Take 100 mg by mouth every other day., Disp: , Rfl:    empagliflozin (JARDIANCE) 25 MG TABS tablet, Take 1 tablet (25 mg total) by mouth daily before breakfast., Disp: 100 tablet, Rfl: 2   fluticasone (FLONASE) 50 MCG/ACT nasal spray, USE 2 SPRAYS IN BOTH NOSTRILS  DAILY AS NEEDED FOR ALLERGIES, Disp: 64 g, Rfl: 1   Fluticasone Furoate (ARNUITY ELLIPTA) 200 MCG/ACT AEPB, 1 puff qd, Disp: 90 each, Rfl: 3   glipiZIDE (GLUCOTROL XL) 10 MG 24 hr tablet, Take 1 tablet (10 mg total) by mouth daily., Disp: 90 tablet, Rfl: 1   glucose blood (ONE TOUCH ULTRA TEST) test strip, Use as instructed to check blood sugar.  DX 250.00, Disp: 100 each, Rfl: prn   lisinopril (ZESTRIL) 10 MG tablet, TAKE 1 TABLET(10 MG) BY MOUTH DAILY, Disp: 90 tablet, Rfl: 1   metFORMIN (GLUCOPHAGE)  1000 MG tablet, Take 1 tablet (1,000 mg total) by mouth 2 (two) times daily with a meal., Disp: 180 tablet, Rfl: 1   metoprolol tartrate (LOPRESSOR) 50 MG tablet, TAKE 1 TABLET BY MOUTH TWICE  DAILY, Disp: 200 tablet, Rfl: 2   pantoprazole (PROTONIX) 40 MG tablet, TAKE 1 TABLET BY MOUTH DAILY, Disp: 90 tablet, Rfl: 1   rosuvastatin (CRESTOR) 20 MG tablet, TAKE 1 TABLET BY MOUTH AT  BEDTIME, Disp: 90 tablet, Rfl: 1   traZODone (DESYREL) 50 MG tablet, TAKE 1 TABLET(50 MG) BY MOUTH AT BEDTIME, Disp: 30 tablet, Rfl: 1   verapamil (CALAN-SR) 120 MG CR tablet, TAKE 1 TABLET BY MOUTH IN THE  MORNING, Disp: 100 tablet, Rfl: 2   albuterol (PROAIR HFA) 108 (90 Base) MCG/ACT inhaler, INHALE 2 PUFFS  INTO THE LUNGS EVERY 4 HOURS AS NEEDED FOR WHEEZING (Patient not taking: Reported on 07/23/2022), Disp: 8.5 g, Rfl: 0   ondansetron (ZOFRAN) 8 MG tablet, Take 1 tablet (8 mg total) by mouth every 8 (eight) hours as needed for nausea or vomiting. (Patient not taking: Reported on 05/08/2022), Disp: 20 tablet, Rfl: 0   ONE TOUCH LANCETS MISC, Use as directed to check blood sugar.  DX 250.00 (Patient not taking: Reported on 07/23/2022), Disp: 200 each, Rfl: prn  EXAM:  VITALS per patient if applicable:     08/06/2022    8:23 AM 07/23/2022    8:33 AM 04/26/2022    3:32 PM  Vitals with BMI  Height  5\' 8"  5\' 8"   Weight  255 lbs 6 oz 253 lbs 13 oz  BMI  38.84 38.6  Systolic 124 125 676  Diastolic 80 86 67  Pulse 85 81 83    GENERAL: alert, oriented, appears well and in no acute distress  HEENT: atraumatic, conjunttiva clear, no obvious abnormalities on inspection of external nose and ears  NECK: normal movements of the head and neck  LUNGS: on inspection no signs of respiratory distress, breathing rate appears normal, no obvious gross SOB, gasping or wheezing  CV: no obvious cyanosis  MS: moves all visible extremities without noticeable abnormality  PSYCH/NEURO: pleasant and cooperative, no obvious  depression or anxiety, speech and thought processing grossly intact  LABS: none today    Chemistry      Component Value Date/Time   NA 143 07/23/2022 0924   NA 138 06/17/2020 1149   K 4.3 07/23/2022 0924   CL 106 07/23/2022 0924   CO2 29 07/23/2022 0924   BUN 15 07/23/2022 0924   BUN 17 06/17/2020 1149   CREATININE 1.07 07/23/2022 0924      Component Value Date/Time   CALCIUM 10.2 07/23/2022 0924   ALKPHOS 62 07/23/2022 0924   AST 28 07/23/2022 0924   ALT 28 07/23/2022 0924   BILITOT 0.9 07/23/2022 0924   BILITOT 0.9 06/17/2020 1149     ASSESSMENT AND PLAN:  Discussed the following assessment and plan:  COVID-19 respiratory illness.  High risk for complications. GFR 68 OK for paxlovid--prescription sent. Hycodan suspension, 1 to 2 teaspoon twice daily as needed, 120 mL. Hold rosuva while on Paxlovid.  I discussed the assessment and treatment plan with the patient. The patient was provided an opportunity to ask questions and all were answered. The patient agreed with the plan and demonstrated an understanding of the instructions.   F/u: If not significantly improving in 3 to 4 days.  Signed:  Santiago Bumpers, MD           08/06/2022

## 2022-08-13 DIAGNOSIS — L570 Actinic keratosis: Secondary | ICD-10-CM | POA: Diagnosis not present

## 2022-08-13 DIAGNOSIS — C44319 Basal cell carcinoma of skin of other parts of face: Secondary | ICD-10-CM | POA: Diagnosis not present

## 2022-08-13 DIAGNOSIS — C44311 Basal cell carcinoma of skin of nose: Secondary | ICD-10-CM | POA: Diagnosis not present

## 2022-08-13 DIAGNOSIS — D485 Neoplasm of uncertain behavior of skin: Secondary | ICD-10-CM | POA: Diagnosis not present

## 2022-09-02 DIAGNOSIS — M9903 Segmental and somatic dysfunction of lumbar region: Secondary | ICD-10-CM | POA: Diagnosis not present

## 2022-09-02 DIAGNOSIS — M5136 Other intervertebral disc degeneration, lumbar region: Secondary | ICD-10-CM | POA: Diagnosis not present

## 2022-09-05 DIAGNOSIS — M9903 Segmental and somatic dysfunction of lumbar region: Secondary | ICD-10-CM | POA: Diagnosis not present

## 2022-09-05 DIAGNOSIS — M5136 Other intervertebral disc degeneration, lumbar region: Secondary | ICD-10-CM | POA: Diagnosis not present

## 2022-09-12 DIAGNOSIS — E119 Type 2 diabetes mellitus without complications: Secondary | ICD-10-CM | POA: Diagnosis not present

## 2022-09-12 DIAGNOSIS — H524 Presbyopia: Secondary | ICD-10-CM | POA: Diagnosis not present

## 2022-09-12 DIAGNOSIS — Z961 Presence of intraocular lens: Secondary | ICD-10-CM | POA: Diagnosis not present

## 2022-09-12 DIAGNOSIS — H04123 Dry eye syndrome of bilateral lacrimal glands: Secondary | ICD-10-CM | POA: Diagnosis not present

## 2022-09-12 LAB — HM DIABETES EYE EXAM

## 2022-09-18 DIAGNOSIS — C44311 Basal cell carcinoma of skin of nose: Secondary | ICD-10-CM | POA: Diagnosis not present

## 2022-09-19 ENCOUNTER — Other Ambulatory Visit: Payer: Self-pay | Admitting: Family Medicine

## 2022-09-19 ENCOUNTER — Other Ambulatory Visit: Payer: Self-pay | Admitting: Cardiology

## 2022-09-19 DIAGNOSIS — I10 Essential (primary) hypertension: Secondary | ICD-10-CM

## 2022-09-19 DIAGNOSIS — I421 Obstructive hypertrophic cardiomyopathy: Secondary | ICD-10-CM

## 2022-10-22 NOTE — Patient Instructions (Signed)

## 2022-10-23 ENCOUNTER — Ambulatory Visit (INDEPENDENT_AMBULATORY_CARE_PROVIDER_SITE_OTHER): Payer: Medicare Other | Admitting: Family Medicine

## 2022-10-23 ENCOUNTER — Encounter: Payer: Self-pay | Admitting: Family Medicine

## 2022-10-23 VITALS — BP 116/83 | HR 70 | Wt 261.2 lb

## 2022-10-23 DIAGNOSIS — I10 Essential (primary) hypertension: Secondary | ICD-10-CM | POA: Diagnosis not present

## 2022-10-23 DIAGNOSIS — E119 Type 2 diabetes mellitus without complications: Secondary | ICD-10-CM

## 2022-10-23 DIAGNOSIS — R600 Localized edema: Secondary | ICD-10-CM

## 2022-10-23 DIAGNOSIS — E7800 Pure hypercholesterolemia, unspecified: Secondary | ICD-10-CM

## 2022-10-23 DIAGNOSIS — Z7984 Long term (current) use of oral hypoglycemic drugs: Secondary | ICD-10-CM

## 2022-10-23 LAB — COMPREHENSIVE METABOLIC PANEL
ALT: 23 U/L (ref 0–53)
AST: 29 U/L (ref 0–37)
Albumin: 4.5 g/dL (ref 3.5–5.2)
Alkaline Phosphatase: 54 U/L (ref 39–117)
BUN: 23 mg/dL (ref 6–23)
CO2: 30 mEq/L (ref 19–32)
Calcium: 10.4 mg/dL (ref 8.4–10.5)
Chloride: 107 mEq/L (ref 96–112)
Creatinine, Ser: 1.17 mg/dL (ref 0.40–1.50)
GFR: 60.9 mL/min (ref >60.00)
Glucose, Bld: 119 mg/dL — ABNORMAL HIGH (ref 70–99)
Potassium: 5.9 mEq/L — ABNORMAL HIGH (ref 3.5–5.1)
Sodium: 143 mEq/L (ref 135–145)
Total Bilirubin: 0.8 mg/dL (ref 0.2–1.2)
Total Protein: 6.6 g/dL (ref 6.0–8.3)

## 2022-10-23 LAB — POCT GLYCOSYLATED HEMOGLOBIN (HGB A1C)
HbA1c POC (<> result, manual entry): 7 % (ref 4.0–5.6)
HbA1c, POC (controlled diabetic range): 7 % (ref 0.0–7.0)
HbA1c, POC (prediabetic range): 7 % — AB (ref 5.7–6.4)
Hemoglobin A1C: 7 % — AB (ref 4.0–5.6)

## 2022-10-23 LAB — LIPID PANEL
Cholesterol: 100 mg/dL (ref 0–200)
HDL: 37.5 mg/dL — ABNORMAL LOW (ref >39.00)
LDL Cholesterol: 36 mg/dL (ref 0–99)
NonHDL: 62.56
Total CHOL/HDL Ratio: 3
Triglycerides: 131 mg/dL (ref 0.0–149.0)
VLDL: 26.2 mg/dL (ref 0.0–40.0)

## 2022-10-23 NOTE — Progress Notes (Signed)
OFFICE VISIT  10/23/2022  CC:  Chief Complaint  Patient presents with   Follow-up    3 month RCI. He has been issues with feet swelling during the day.    Patient is a 77 y.o. male who presents for 27-month follow-up diabetes, hypertension, and hyperlipidemia. A/P as of last visit: "#2 diabetes without complication.  Good control. POC Hba1c today is 6.7%. Feet exam normal today Urine microalbumin/creatinine today. Continue metformin 1000 mg twice a day, Jardiance 25 mg twice a day, and glipizide XL 10 mg daily.   3.  Hypertension, doing well on lisinopril 10 mg a day and Lopressor 50 mg twice a day. Electrolytes and creatinine today.   4.  Hypercholesterolemia. Doing well on Crestor 20 mg a day. Lipid panel and hepatic panel today.   Erythrocytosis, chronic.  Suspect due to inadequately treated obstructive sleep apnea. Hemoglobin was 18 at last check 7 months ago. Will recheck CBC today."  INTERIM HX: Feeling well. Has increased swelling in both legs during the daytime. He had some Lasix at home and took this for a few days.  Says he urinated too much. The swelling goes down at night. He does not restrict sodium in his diet, in fact sounds like he eats high sodium diet.  ROS as above, plus--> no fevers, no CP, no SOB, no wheezing, no cough, no dizziness, no HAs, no rashes, no melena/hematochezia.  No polyuria or polydipsia.  No myalgias or arthralgias.  No focal weakness, paresthesias, or tremors.  No acute vision or hearing abnormalities.  No dysuria or unusual/new urinary urgency or frequency.  No n/v/d or abd pain.  No palpitations.    Past Medical History:  Diagnosis Date   Allergy    CAD (coronary artery disease)    see PSH section for details   Carotid bruit 05/28/10   Tortuosity of carotids on doppler u/s, no stenosis.   Cataract    Chronic diastolic heart failure (HCC)    Grd II DD   Colon cancer screening    cologuard POSITIVE 09/2017-->colonoscopy 12/19/17;  multiple adenomatous polyps, recall 3 yrs.   COPD (chronic obstructive pulmonary disease) (HCC)    Diabetes mellitus    type 2- non insulin-requiring   DOE (dyspnea on exertion)    Cardiology w/u unrevealing except LVOT obstruction: suspect dyspnea due to HOCM, morbid obesity and OSA--stable as of 02/2017 cardiol f/u.   Fatty liver u/s and CT 2009   w/mildly elevated transaminases; u/s 12/2012 reconfirmed dx.  Hep B and C testing negative.   GERD (gastroesophageal reflux disease)    EDG 04-03-01, + distal esophageal stricture dilation   Gout    Heart murmur    Hiatal hernia    History of iron deficiency anemia 02/2019   Hb 12.9 02/2019->home hemoccults ordered but pt never returned these.  GI referral was done but pt did not return their call/unable to be contacted. Fall 2021 iron low but Hb normal->iron supp brought iron levels up (hemoccults neg x 3).   Hx of adenomatous colonic polyps 12/27/2017   Hyperlipidemia    Hypertension    severe LVH on echo   Hypertrophic obstructive cardiomyopathy (HOCM) (HCC) 2012   LVOT obstruction stable on echo 01/09/12 and again on f/u echo 09/29/14, 11/2016, 10/2017--with evidence of HOCM due to chordal systolic anterior motion of MV leaflet. Grd II DD.     Moderate persistent asthma 04/19/2014   Morbid obesity (HCC)    OSA on CPAP    Could not  tolerate CPAP, not surgical candidate per Dr. Suszanne Conners.  Restarted CPAP (auto titrate 5-15 with full facemask--Dr. Vassie Loll 2017.  Getting mask fitting/leak issues worked out as of 07/2016 pulm f/u.   Osteoarthritis, multiple sites    primarily knees and back.   Stricture and stenosis of esophagus    Venous insufficiency of both lower extremities     Past Surgical History:  Procedure Laterality Date   CARDIOVASCULAR STRESS TEST  2006; 04/27/10; 10/2017   Normal stress nuclear study, normal EF.  2019: EF 40%, inferior ischemia--intermediate risk study-->Dr. Jacinto Halim to do cath.   COLONOSCOPY  x 3   Most recent-->12/19/17 (after  + cologuard) multiple adenomatous polyps-->recall 3 yrs.   LEFT HEART CATH AND CORONARY ANGIOGRAPHY N/A 01/06/2018   One area of 80% ostial stenosis--small and hard to visualize. No intervention.  DAPT for >1 yr recommended.  Procedure: LEFT HEART CATH AND CORONARY ANGIOGRAPHY;  Surgeon: Yates Decamp, MD;  Location: [facility];  Service: Cardiovascular;  Laterality: N/A;   NASAL SINUS SURGERY     Sleep study  07/2016   OSA; CPAP trial at 10 cm H20 recommended by pulm (Dr. Tommi Rumps Dios--[place] pulm).   SPINE SURGERY  1970   ? Discectomy   TEE WITHOUT CARDIOVERSION  05/2010   Dr. Jacinto Halim; severe LVH, no valvular abnormalities   TRANSESOPHAGEAL ECHOCARDIOGRAM  06/06/10; 12/2011; 09/2014; 11/2016   LVOT obst with 135 mm Hg PG. HOCM. No AV obstruction to flow. No mitral regurg..  Repeat echo 01/09/12 no change.  2016: EF 60%, severe conc LV hypertrophy, normal global wall motion, LA mildly dilated, systolic anterior motion of mitral valve chord, mild MR, mild TR, mild pulm HTN--no signif change since 2013.  Repeat echo 11/2016 and 10/2017 no signif change compared to prior studies.   TRANSTHORACIC ECHOCARDIOGRAM  05/28/2010; 10/2017   2012; Normal LVEF, LVOT obst, lateral wall hypokin.  10/2017: EF 55%, hypertrophic CM, grd II dd, normal global wall motion. 02/2022 no change except now with severe LA dilation    Outpatient Medications Prior to Visit  Medication Sig Dispense Refill   allopurinol (ZYLOPRIM) 300 MG tablet TAKE 1 TABLET BY MOUTH TWICE  DAILY 180 tablet 1   aspirin 81 MG tablet Take 81 mg by mouth daily.     Cholecalciferol (VITAMIN D) 2000 UNITS tablet Take 2,000 Units by mouth daily.     diclofenac Sodium (VOLTAREN) 1 % GEL Apply 2 g topically 4 (four) times daily. 100 g 3   docusate sodium (COLACE) 100 MG capsule Take 100 mg by mouth every other day.     empagliflozin (JARDIANCE) 25 MG TABS tablet Take 1 tablet (25 mg total) by mouth daily before breakfast. 100 tablet 2   fluticasone  (FLONASE) 50 MCG/ACT nasal spray USE 2 SPRAYS IN BOTH NOSTRILS  DAILY AS NEEDED FOR ALLERGIES 64 g 1   Fluticasone Furoate (ARNUITY ELLIPTA) 200 MCG/ACT AEPB 1 puff qd 90 each 3   glipiZIDE (GLUCOTROL XL) 10 MG 24 hr tablet TAKE 1 TABLET BY MOUTH DAILY 90 tablet 1   lisinopril (ZESTRIL) 10 MG tablet TAKE 1 TABLET BY MOUTH DAILY 90 tablet 1   metFORMIN (GLUCOPHAGE) 1000 MG tablet TAKE 1 TABLET BY MOUTH TWICE  DAILY WITH MEALS 180 tablet 1   metoprolol tartrate (LOPRESSOR) 50 MG tablet TAKE 1 TABLET BY MOUTH TWICE  DAILY 200 tablet 2   pantoprazole (PROTONIX) 40 MG tablet TAKE 1 TABLET BY MOUTH DAILY 90 tablet 1   rosuvastatin (CRESTOR) 20  MG tablet TAKE 1 TABLET BY MOUTH AT  BEDTIME 90 tablet 1   traZODone (DESYREL) 50 MG tablet TAKE 1 TABLET(50 MG) BY MOUTH AT BEDTIME 30 tablet 1   verapamil (CALAN-SR) 120 MG CR tablet TAKE 1 TABLET BY MOUTH IN THE  MORNING 100 tablet 2   albuterol (PROAIR HFA) 108 (90 Base) MCG/ACT inhaler INHALE 2 PUFFS INTO THE LUNGS EVERY 4 HOURS AS NEEDED FOR WHEEZING (Patient not taking: Reported on 07/23/2022) 8.5 g 0   glucose blood (ONE TOUCH ULTRA TEST) test strip Use as instructed to check blood sugar.  DX 250.00 100 each prn   ondansetron (ZOFRAN) 8 MG tablet Take 1 tablet (8 mg total) by mouth every 8 (eight) hours as needed for nausea or vomiting. (Patient not taking: Reported on 05/08/2022) 20 tablet 0   ONE TOUCH LANCETS MISC Use as directed to check blood sugar.  DX 250.00 (Patient not taking: Reported on 07/23/2022) 200 each prn   No facility-administered medications prior to visit.    No Known Allergies  Review of Systems As per HPI  PE:    10/23/2022    8:59 AM 08/06/2022    8:23 AM 07/23/2022    8:33 AM  Vitals with BMI  Height   5\' 8"   Weight 261 lbs 3 oz  255 lbs 6 oz  BMI   38.84  Systolic 116 124 253  Diastolic 83 80 86  Pulse 70 85 81     Physical Exam  Gen: Alert, well appearing.  Patient is oriented to person, place, time, and  situation. AFFECT: pleasant, lucid thought and speech. CV: RRR, 4/6 systolic murmur, no diastolic murmur Chest is clear, no wheezing or rales. Normal symmetric air entry throughout both lung fields. No chest wall deformities or tenderness. EXT: no clubbing or cyanosis.  3+ bilat LE pitting edema.   LABS:  Last CBC Lab Results  Component Value Date   WBC 9.4 07/23/2022   HGB 18.2 Repeated and verified X2. (HH) 07/23/2022   HCT 54.2 (H) 07/23/2022   MCV 93.0 07/23/2022   MCH 30.2 06/17/2020   RDW 15.3 07/23/2022   PLT 155.0 07/23/2022   Lab Results  Component Value Date   IRON 93 05/26/2020   TIBC 353 05/26/2020   FERRITIN 44 05/26/2020   Last metabolic panel Lab Results  Component Value Date   GLUCOSE 114 (H) 07/23/2022   NA 143 07/23/2022   K 4.3 07/23/2022   CL 106 07/23/2022   CO2 29 07/23/2022   BUN 15 07/23/2022   CREATININE 1.07 07/23/2022   GFRNONAA 80 06/17/2020   CALCIUM 10.2 07/23/2022   PROT 6.9 07/23/2022   ALBUMIN 4.7 07/23/2022   LABGLOB 2.0 06/17/2020   AGRATIO 2.2 06/17/2020   BILITOT 0.9 07/23/2022   ALKPHOS 62 07/23/2022   AST 28 07/23/2022   ALT 28 07/23/2022   Last lipids Lab Results  Component Value Date   CHOL 175 07/23/2022   HDL 48.80 07/23/2022   LDLCALC 98 07/23/2022   TRIG 145.0 07/23/2022   CHOLHDL 4 07/23/2022   Last hemoglobin A1c Lab Results  Component Value Date   HGBA1C 7.0 (A) 10/23/2022   HGBA1C 7.0 10/23/2022   HGBA1C 7.0 (A) 10/23/2022   HGBA1C 7.0 10/23/2022   Last thyroid functions Lab Results  Component Value Date   TSH 2.34 02/16/2021   Last vitamin B12 and Folate Lab Results  Component Value Date   VITAMINB12 386 12/25/2012   FOLATE 16.6 12/25/2012  IMPRESSION AND PLAN:  #1 bilateral lower extremity edema due to chronic venous insufficiency and chronic diastolic dysfunction.  Likely some contribution from his obstructive sleep apnea.  Discussed importance of low-sodium diet.  Would like to avoid  diuretics.  Compression stockings discussed.  Elevation discussed.  2.  Diabetes without complication. Control is adequate. POC hba1c today is 7.0%. Encouraged him to make some dietary adjustments. Continue Jardiance 25 mg a day, glipizide XL 10 mg a day, and metformin 1000 mg twice a day.  #3 hypertension, good control on lisinopril 10 mg a day and Lopressor 50 mg twice daily.  #4 hypercholesterolemia.  Doing well on Crestor 20 mg a day. Last LDL was 98 about 3 months ago. If LDL not less than 70 today will increase rosuvastatin to 40 mg daily.  An After Visit Summary was printed and given to the patient.  FOLLOW UP: Return in about 3 months (around 01/23/2023) for routine chronic illness f/u. Next cpe 06/2023 Signed:  Santiago Bumpers, MD           10/23/2022

## 2022-10-24 ENCOUNTER — Other Ambulatory Visit: Payer: Self-pay | Admitting: Family Medicine

## 2022-10-24 DIAGNOSIS — I10 Essential (primary) hypertension: Secondary | ICD-10-CM

## 2022-10-29 DIAGNOSIS — M79672 Pain in left foot: Secondary | ICD-10-CM | POA: Diagnosis not present

## 2022-10-29 DIAGNOSIS — M79671 Pain in right foot: Secondary | ICD-10-CM | POA: Diagnosis not present

## 2022-10-29 DIAGNOSIS — I890 Lymphedema, not elsewhere classified: Secondary | ICD-10-CM | POA: Diagnosis not present

## 2022-11-28 ENCOUNTER — Other Ambulatory Visit: Payer: Self-pay | Admitting: Family Medicine

## 2022-12-12 ENCOUNTER — Encounter (INDEPENDENT_AMBULATORY_CARE_PROVIDER_SITE_OTHER): Payer: Self-pay

## 2022-12-13 ENCOUNTER — Other Ambulatory Visit: Payer: Self-pay | Admitting: Family Medicine

## 2022-12-16 NOTE — Telephone Encounter (Signed)
Pharmacy is requesting refills for 4 medications, patient is not currently due for refills. Last refill was 5/24 for 6 month supply.

## 2022-12-17 ENCOUNTER — Other Ambulatory Visit: Payer: Self-pay | Admitting: Family Medicine

## 2023-01-01 DIAGNOSIS — M9903 Segmental and somatic dysfunction of lumbar region: Secondary | ICD-10-CM | POA: Diagnosis not present

## 2023-01-01 DIAGNOSIS — M5136 Other intervertebral disc degeneration, lumbar region: Secondary | ICD-10-CM | POA: Diagnosis not present

## 2023-01-02 ENCOUNTER — Other Ambulatory Visit: Payer: Self-pay

## 2023-01-02 MED ORDER — EMPAGLIFLOZIN 25 MG PO TABS: 25.0000 mg | ORAL_TABLET | Freq: Every day | ORAL | 3 refills | Status: DC

## 2023-01-23 ENCOUNTER — Ambulatory Visit (INDEPENDENT_AMBULATORY_CARE_PROVIDER_SITE_OTHER): Payer: Medicare Other | Admitting: Family Medicine

## 2023-01-23 ENCOUNTER — Telehealth: Payer: Self-pay

## 2023-01-23 ENCOUNTER — Encounter: Payer: Self-pay | Admitting: Family Medicine

## 2023-01-23 VITALS — BP 120/80 | HR 71 | Temp 98.4°F | Wt 261.4 lb

## 2023-01-23 DIAGNOSIS — E119 Type 2 diabetes mellitus without complications: Secondary | ICD-10-CM

## 2023-01-23 DIAGNOSIS — I4891 Unspecified atrial fibrillation: Secondary | ICD-10-CM | POA: Diagnosis not present

## 2023-01-23 DIAGNOSIS — R600 Localized edema: Secondary | ICD-10-CM

## 2023-01-23 DIAGNOSIS — I10 Essential (primary) hypertension: Secondary | ICD-10-CM

## 2023-01-23 DIAGNOSIS — E875 Hyperkalemia: Secondary | ICD-10-CM | POA: Diagnosis not present

## 2023-01-23 DIAGNOSIS — Z7984 Long term (current) use of oral hypoglycemic drugs: Secondary | ICD-10-CM | POA: Diagnosis not present

## 2023-01-23 DIAGNOSIS — R7989 Other specified abnormal findings of blood chemistry: Secondary | ICD-10-CM | POA: Diagnosis not present

## 2023-01-23 DIAGNOSIS — D751 Secondary polycythemia: Secondary | ICD-10-CM | POA: Diagnosis not present

## 2023-01-23 DIAGNOSIS — G4733 Obstructive sleep apnea (adult) (pediatric): Secondary | ICD-10-CM | POA: Diagnosis not present

## 2023-01-23 LAB — CBC
HCT: 53.9 % — ABNORMAL HIGH (ref 39.0–52.0)
Hemoglobin: 17.3 g/dL — ABNORMAL HIGH (ref 13.0–17.0)
MCHC: 32.1 g/dL (ref 30.0–36.0)
MCV: 94.1 fl (ref 78.0–100.0)
Platelets: 203 10*3/uL (ref 150.0–400.0)
RBC: 5.73 Mil/uL (ref 4.22–5.81)
RDW: 15.3 % (ref 11.5–15.5)
WBC: 9.8 10*3/uL (ref 4.0–10.5)

## 2023-01-23 LAB — TSH: TSH: 2.58 u[IU]/mL (ref 0.35–5.50)

## 2023-01-23 LAB — BASIC METABOLIC PANEL
BUN: 22 mg/dL (ref 6–23)
CO2: 28 mEq/L (ref 19–32)
Calcium: 10 mg/dL (ref 8.4–10.5)
Chloride: 108 mEq/L (ref 96–112)
Creatinine, Ser: 1.37 mg/dL (ref 0.40–1.50)
GFR: 50.3 mL/min — ABNORMAL LOW (ref >60.00)
Glucose, Bld: 115 mg/dL — ABNORMAL HIGH (ref 70–99)
Potassium: 5.4 mEq/L — ABNORMAL HIGH (ref 3.5–5.1)
Sodium: 143 mEq/L (ref 135–145)

## 2023-01-23 LAB — POCT GLYCOSYLATED HEMOGLOBIN (HGB A1C)
HbA1c POC (<> result, manual entry): 7.1 % (ref 4.0–5.6)
HbA1c, POC (controlled diabetic range): 7.1 % — AB (ref 0.0–7.0)
HbA1c, POC (prediabetic range): 7.1 % — AB (ref 5.7–6.4)
Hemoglobin A1C: 7.1 % — AB (ref 4.0–5.6)

## 2023-01-23 LAB — MAGNESIUM: Magnesium: 2 mg/dL (ref 1.5–2.5)

## 2023-01-23 MED ORDER — APIXABAN 5 MG PO TABS: 5.0000 mg | ORAL_TABLET | Freq: Two times a day (BID) | ORAL | 6 refills | Status: DC

## 2023-01-23 NOTE — Telephone Encounter (Signed)
Spoke with cardiology office and she states they are booked until Jan but she did add pt to a cancellation list in case someone cancels.

## 2023-01-23 NOTE — Telephone Encounter (Signed)
Left VM for pt to return my call

## 2023-01-23 NOTE — Telephone Encounter (Signed)
Pt advised of cardiology appt

## 2023-01-23 NOTE — Progress Notes (Signed)
OFFICE VISIT  01/23/2023  CC:  Chief Complaint  Patient presents with   Follow-up    3 month fasting follow up. Wants to discuss the RSV vaccine. He wants to see if he should get it.    Patient is a 77 y.o. male who presents for 8-month follow-up bilateral lower extremity edema, diabetes, and hypertension. A/P as of last visit: "1 bilateral lower extremity edema due to chronic venous insufficiency and chronic diastolic dysfunction.  Likely some contribution from his obstructive sleep apnea.  Discussed importance of low-sodium diet.  Would like to avoid diuretics.  Compression stockings discussed.  Elevation discussed.   2.  Diabetes without complication. Control is adequate. POC hba1c today is 7.0%. Encouraged him to make some dietary adjustments. Continue Jardiance 25 mg a day, glipizide XL 10 mg a day, and metformin 1000 mg twice a day.   #3 hypertension, good control on lisinopril 10 mg a day and Lopressor 50 mg twice daily.   #4 hypercholesterolemia.  Doing well on Crestor 20 mg a day. Last LDL was 98 about 3 months ago. If LDL not less than 70 today will increase rosuvastatin to 40 mg daily."  INTERIM HX: Jack [name] feels well. He does not check any glucose or blood pressure measurements at home.  ROS -->no fevers, no CP, no SOB, no wheezing, no cough, no dizziness, no HAs, no rashes, no melena/hematochezia.  No polyuria or polydipsia.  No myalgias or arthralgias.  No focal weakness, paresthesias, or tremors.  No acute vision or hearing abnormalities.  No dysuria or unusual/new urinary urgency or frequency.  No recent changes in lower legs. No n/v/d or abd pain.  No palpitations.    Past Medical History:  Diagnosis Date   Allergy    CAD (coronary artery disease)    see PSH section for details   Carotid bruit 05/28/10   Tortuosity of carotids on doppler u/s, no stenosis.   Cataract    Chronic diastolic heart failure (HCC)    Grd II DD   Colon cancer screening    cologuard  POSITIVE 09/2017-->colonoscopy 12/19/17; multiple adenomatous polyps, recall 3 yrs.   COPD (chronic obstructive pulmonary disease) (HCC)    Diabetes mellitus    type 2- non insulin-requiring   DOE (dyspnea on exertion)    Cardiology w/u unrevealing except LVOT obstruction: suspect dyspnea due to HOCM, morbid obesity and OSA--stable as of 02/2017 cardiol f/u.   Fatty liver u/s and CT 2009   w/mildly elevated transaminases; u/s 12/2012 reconfirmed dx.  Hep B and C testing negative.   GERD (gastroesophageal reflux disease)    EDG 04-03-01, + distal esophageal stricture dilation   Gout    Heart murmur    Hiatal hernia    History of iron deficiency anemia 02/2019   Hb 12.9 02/2019->home hemoccults ordered but pt never returned these.  GI referral was done but pt did not return their call/unable to be contacted. Fall 2021 iron low but Hb normal->iron supp brought iron levels up (hemoccults neg x 3).   Hx of adenomatous colonic polyps 12/27/2017   Hyperlipidemia    Hypertension    severe LVH on echo   Hypertrophic obstructive cardiomyopathy (HOCM) (HCC) 2012   LVOT obstruction stable on echo 01/09/12 and again on f/u echo 09/29/14, 11/2016, 10/2017--with evidence of HOCM due to chordal systolic anterior motion of MV leaflet. Grd II DD.     Moderate persistent asthma 04/19/2014   Morbid obesity (HCC)    OSA on CPAP  Could not tolerate CPAP, not surgical candidate per Dr. Suszanne Conners.  Restarted CPAP (auto titrate 5-15 with full facemask--Dr. Vassie Loll 2017.  Getting mask fitting/leak issues worked out as of 07/2016 pulm f/u.   Osteoarthritis, multiple sites    primarily knees and back.   Stricture and stenosis of esophagus    Venous insufficiency of both lower extremities     Past Surgical History:  Procedure Laterality Date   CARDIOVASCULAR STRESS TEST  2006; 04/27/10; 10/2017   Normal stress nuclear study, normal EF.  2019: EF 40%, inferior ischemia--intermediate risk study-->Dr. Jacinto Halim to do cath.    COLONOSCOPY  x 3   Most recent-->12/19/17 (after + cologuard) multiple adenomatous polyps-->recall 3 yrs.   LEFT HEART CATH AND CORONARY ANGIOGRAPHY N/A 01/06/2018   One area of 80% ostial stenosis--small and hard to visualize. No intervention.  DAPT for >1 yr recommended.  Procedure: LEFT HEART CATH AND CORONARY ANGIOGRAPHY;  Surgeon: Yates Decamp, MD;  Location: [facility];  Service: Cardiovascular;  Laterality: N/A;   NASAL SINUS SURGERY     Sleep study  07/2016   OSA; CPAP trial at 10 cm H20 recommended by pulm (Dr. Tommi Rumps Dios--[place] [facility]).   SPINE SURGERY  1970   ? Discectomy   TEE WITHOUT CARDIOVERSION  05/2010   Dr. Jacinto Halim; severe LVH, no valvular abnormalities   TRANSESOPHAGEAL ECHOCARDIOGRAM  06/06/10; 12/2011; 09/2014; 11/2016   LVOT obst with 135 mm Hg PG. HOCM. No AV obstruction to flow. No mitral regurg..  Repeat echo 01/09/12 no change.  2016: EF 60%, severe conc LV hypertrophy, normal global wall motion, LA mildly dilated, systolic anterior motion of mitral valve chord, mild MR, mild TR, mild pulm HTN--no signif change since 2013.  Repeat echo 11/2016 and 10/2017 no signif change compared to prior studies.   TRANSTHORACIC ECHOCARDIOGRAM  05/28/2010; 10/2017   2012; Normal LVEF, LVOT obst, lateral wall hypokin.  10/2017: EF 55%, hypertrophic CM, grd II dd, normal global wall motion. 02/2022 no change except now with severe LA dilation    Outpatient Medications Prior to Visit  Medication Sig Dispense Refill   allopurinol (ZYLOPRIM) 300 MG tablet TAKE 1 TABLET BY MOUTH TWICE  DAILY 180 tablet 1   aspirin 81 MG tablet Take 81 mg by mouth daily.     Cholecalciferol (VITAMIN D) 2000 UNITS tablet Take 2,000 Units by mouth daily.     diclofenac Sodium (VOLTAREN) 1 % GEL Apply 2 g topically 4 (four) times daily. 100 g 3   docusate sodium (COLACE) 100 MG capsule Take 100 mg by mouth every other day.     empagliflozin (JARDIANCE) 25 MG TABS tablet Take 1 tablet (25 mg total) by mouth daily  before breakfast. 90 tablet 3   fluticasone (FLONASE) 50 MCG/ACT nasal spray USE 2 SPRAYS IN BOTH NOSTRILS  DAILY AS NEEDED FOR ALLERGIES 64 g 1   Fluticasone Furoate (ARNUITY ELLIPTA) 200 MCG/ACT AEPB 1 puff qd 90 each 3   glipiZIDE (GLUCOTROL XL) 10 MG 24 hr tablet TAKE 1 TABLET BY MOUTH DAILY 90 tablet 1   glucose blood (ONE TOUCH ULTRA TEST) test strip Use as instructed to check blood sugar.  DX 250.00 100 each prn   lisinopril (ZESTRIL) 10 MG tablet TAKE 1 TABLET BY MOUTH DAILY 35 tablet 0   metFORMIN (GLUCOPHAGE) 1000 MG tablet TAKE 1 TABLET BY MOUTH TWICE  DAILY WITH MEALS 180 tablet 1   metoprolol tartrate (LOPRESSOR) 50 MG tablet TAKE 1 TABLET BY MOUTH TWICE  DAILY 200 tablet 2   pantoprazole (PROTONIX) 40 MG tablet TAKE 1 TABLET BY MOUTH DAILY 90 tablet 1   rosuvastatin (CRESTOR) 20 MG tablet TAKE 1 TABLET BY MOUTH AT  BEDTIME 90 tablet 1   traZODone (DESYREL) 50 MG tablet TAKE 1 TABLET(50 MG) BY MOUTH AT BEDTIME 30 tablet 1   verapamil (CALAN-SR) 120 MG CR tablet TAKE 1 TABLET BY MOUTH IN THE  MORNING 100 tablet 2   albuterol (PROAIR HFA) 108 (90 Base) MCG/ACT inhaler INHALE 2 PUFFS INTO THE LUNGS EVERY 4 HOURS AS NEEDED FOR WHEEZING (Patient not taking: Reported on 07/23/2022) 8.5 g 0   ondansetron (ZOFRAN) 8 MG tablet Take 1 tablet (8 mg total) by mouth every 8 (eight) hours as needed for nausea or vomiting. (Patient not taking: Reported on 05/08/2022) 20 tablet 0   ONE TOUCH LANCETS MISC Use as directed to check blood sugar.  DX 250.00 (Patient not taking: Reported on 07/23/2022) 200 each prn   No facility-administered medications prior to visit.    No Known Allergies  Review of Systems As per HPI  PE:    01/23/2023    8:32 AM 10/23/2022    8:59 AM 08/06/2022    8:23 AM  Vitals with BMI  Weight 261 lbs 6 oz 261 lbs 3 oz   Systolic 120 116 657  Diastolic 80 83 80  Pulse 71 70 85   Physical Exam  Gen: Alert, well appearing.  Patient is oriented to person, place, time, and  situation. AFFECT: pleasant, lucid thought and speech. CV: rhythm is irregular, rate 70-75, 1-2/6 systolic murmur, no diastolic murmur. S1 and S2 distant d/t thick chest + bowel sounds audible in chest Chest is clear, no wheezing or rales. Normal symmetric air entry throughout both lung fields. No chest wall deformities or tenderness.  LABS:  Last CBC Lab Results  Component Value Date   WBC 9.4 07/23/2022   HGB 18.2 Repeated and verified X2. (HH) 07/23/2022   HCT 54.2 (H) 07/23/2022   MCV 93.0 07/23/2022   MCH 30.2 06/17/2020   RDW 15.3 07/23/2022   PLT 155.0 07/23/2022   Lab Results  Component Value Date   IRON 93 05/26/2020   TIBC 353 05/26/2020   FERRITIN 44 05/26/2020   Last metabolic panel Lab Results  Component Value Date   GLUCOSE 119 (H) 10/23/2022   NA 143 10/23/2022   K 5.9 Hemolysis seen.. (H) 10/23/2022   CL 107 10/23/2022   CO2 30 10/23/2022   BUN 23 10/23/2022   CREATININE 1.17 10/23/2022   GFR 60.90 10/23/2022   CALCIUM 10.4 10/23/2022   PROT 6.6 10/23/2022   ALBUMIN 4.5 10/23/2022   LABGLOB 2.0 06/17/2020   AGRATIO 2.2 06/17/2020   BILITOT 0.8 10/23/2022   ALKPHOS 54 10/23/2022   AST 29 10/23/2022   ALT 23 10/23/2022   Last lipids Lab Results  Component Value Date   CHOL 100 10/23/2022   HDL 37.50 (L) 10/23/2022   LDLCALC 36 10/23/2022   TRIG 131.0 10/23/2022   CHOLHDL 3 10/23/2022   Last hemoglobin A1c Lab Results  Component Value Date   HGBA1C 7.1 (A) 01/23/2023   HGBA1C 7.1 01/23/2023   HGBA1C 7.1 (A) 01/23/2023   HGBA1C 7.1 (A) 01/23/2023   Last thyroid functions Lab Results  Component Value Date   TSH 2.34 02/16/2021   Last vitamin B12 and Folate Lab Results  Component Value Date   VITAMINB12 386 12/25/2012   FOLATE 16.6 12/25/2012   12  lead EKG today: Atrial fibrillation, rate 61, right bundle branch block with left axis bifascicular block.  Repolarization abnormality. Compared to EKG 03/27/2022 atrial fibrillation is  new.  IMPRESSION AND PLAN:  #1 new diagnosis A-fib.  Asymptomatic. CHA2DS2-VASc score is 4. Start Eliquis 5 mg twice daily.  Okay to continue verapamil 120 qd and Lopressor 50 mg twice daily at this time. Checking TSH and electrolytes today. His next follow-up appointment with Dr. Jacinto Halim is in December of this year.  We will call and see if we can get this moved up some.  He will follow-up with me in 2 weeks. Of note, most recent echocardiogram November 2023 : EF 55%, hypertrophic CM, grd II dd, normal global wall motion, and severe LA dilation.  #2 diabetes without complication. Control is adequate. POC hba1c today is 7.1%. Encouraged him to make some dietary adjustments. Continue Jardiance 25 mg a day, glipizide XL 10 mg a day, and metformin 1000 mg twice a day.   #3 hypertension, good control on lisinopril 10 mg a day and Lopressor 50 mg twice daily.  #4 Erythrocytosis.  He has OSA and is not wearing his CPAP. --> encouraged him to use his CPAP every night. Monitor hemoglobin today and will recheck again at next follow-up.  An After Visit Summary was printed and given to the patient.  FOLLOW UP: Return in about 2 weeks (around 02/06/2023) for f/u a-fib.  Signed:  Santiago Bumpers, MD           01/23/2023

## 2023-01-23 NOTE — Telephone Encounter (Signed)
Okay.  Please communicate this to the patient and let him know I think this is okay.

## 2023-01-24 LAB — IRON,TIBC AND FERRITIN PANEL
%SAT: 22 % (ref 20–48)
Ferritin: 40 ng/mL (ref 24–380)
Iron: 76 ug/dL (ref 50–180)
TIBC: 350 ug/dL (ref 250–425)

## 2023-01-24 NOTE — Addendum Note (Signed)
Addended by: Arty Baumgartner A on: 01/24/2023 08:53 AM   Modules accepted: Orders

## 2023-01-27 ENCOUNTER — Other Ambulatory Visit: Payer: Self-pay | Admitting: Family Medicine

## 2023-01-30 ENCOUNTER — Other Ambulatory Visit (INDEPENDENT_AMBULATORY_CARE_PROVIDER_SITE_OTHER): Payer: Medicare Other

## 2023-01-30 DIAGNOSIS — E875 Hyperkalemia: Secondary | ICD-10-CM

## 2023-01-30 DIAGNOSIS — R7989 Other specified abnormal findings of blood chemistry: Secondary | ICD-10-CM | POA: Diagnosis not present

## 2023-01-30 LAB — BASIC METABOLIC PANEL
BUN: 19 mg/dL (ref 6–23)
CO2: 28 meq/L (ref 19–32)
Calcium: 9.7 mg/dL (ref 8.4–10.5)
Chloride: 106 meq/L (ref 96–112)
Creatinine, Ser: 1.34 mg/dL (ref 0.40–1.50)
GFR: 51.65 mL/min — ABNORMAL LOW (ref >60.00)
Glucose, Bld: 193 mg/dL — ABNORMAL HIGH (ref 70–99)
Potassium: 4.8 meq/L (ref 3.5–5.1)
Sodium: 142 meq/L (ref 135–145)

## 2023-02-05 ENCOUNTER — Telehealth: Payer: Self-pay | Admitting: Family Medicine

## 2023-02-05 DIAGNOSIS — E119 Type 2 diabetes mellitus without complications: Secondary | ICD-10-CM

## 2023-02-05 MED ORDER — EMPAGLIFLOZIN 25 MG PO TABS
25.0000 mg | ORAL_TABLET | Freq: Every day | ORAL | 3 refills | Status: DC
Start: 2023-02-05 — End: 2023-02-26

## 2023-02-05 NOTE — Telephone Encounter (Signed)
Signed and put in box to go up front. Signed:  Santiago Bumpers, MD           02/05/2023

## 2023-02-05 NOTE — Telephone Encounter (Signed)
Form received by [facility], to be filled out by provider. Patient requested to send it back via Fax within 5-days. Document is located in providers tray at front office.  Placed on PCP desk to review and sign, if appropriate.

## 2023-02-06 ENCOUNTER — Ambulatory Visit (INDEPENDENT_AMBULATORY_CARE_PROVIDER_SITE_OTHER): Payer: Medicare Other | Admitting: Family Medicine

## 2023-02-06 ENCOUNTER — Encounter: Payer: Self-pay | Admitting: Family Medicine

## 2023-02-06 VITALS — BP 113/71 | HR 75 | Temp 97.9°F | Ht 68.0 in | Wt 255.8 lb

## 2023-02-06 DIAGNOSIS — I4891 Unspecified atrial fibrillation: Secondary | ICD-10-CM

## 2023-02-06 DIAGNOSIS — R7989 Other specified abnormal findings of blood chemistry: Secondary | ICD-10-CM | POA: Diagnosis not present

## 2023-02-06 DIAGNOSIS — I422 Other hypertrophic cardiomyopathy: Secondary | ICD-10-CM

## 2023-02-06 LAB — BASIC METABOLIC PANEL
BUN: 16 mg/dL (ref 6–23)
CO2: 25 meq/L (ref 19–32)
Calcium: 9.9 mg/dL (ref 8.4–10.5)
Chloride: 109 meq/L (ref 96–112)
Creatinine, Ser: 1.1 mg/dL (ref 0.40–1.50)
GFR: 65.44 mL/min (ref >60.00)
Glucose, Bld: 112 mg/dL — ABNORMAL HIGH (ref 70–99)
Potassium: 4.9 meq/L (ref 3.5–5.1)
Sodium: 142 meq/L (ref 135–145)

## 2023-02-06 MED ORDER — METFORMIN HCL 1000 MG PO TABS: 1000.0000 mg | ORAL_TABLET | Freq: Two times a day (BID) | ORAL | 1 refills | Status: DC

## 2023-02-06 MED ORDER — GLIPIZIDE ER 10 MG PO TB24: 10.0000 mg | ORAL_TABLET | Freq: Every day | ORAL | 1 refills | Status: DC

## 2023-02-06 MED ORDER — ALLOPURINOL 300 MG PO TABS: 300.0000 mg | ORAL_TABLET | Freq: Two times a day (BID) | ORAL | 1 refills | Status: DC

## 2023-02-06 NOTE — Progress Notes (Signed)
OFFICE VISIT  02/06/2023  CC:   Patient is a 77 y.o. Jack [name] who presents accompanied by his wife for 2-week follow-up new diagnosis atrial fibrillation. A/P as of last visit: "#1 new diagnosis A-fib.  Asymptomatic. CHA2DS2-VASc score is 4. Start Eliquis 5 mg twice daily.  Okay to continue verapamil 120 qd and Lopressor 50 mg twice daily at this time. Checking TSH and electrolytes today. His next follow-up appointment with Dr. Jacinto [name] is in December of this year.  We will call and see if we can get this moved up some.  He will follow-up with me in 2 weeks. Of note, most recent echocardiogram November 2023 : EF 55%, hypertrophic CM, grd II dd, normal global wall motion, and severe LA dilation.   #2 diabetes without complication. Control is adequate. POC hba1c today is 7.1%. Encouraged him to make some dietary adjustments. Continue Jardiance 25 mg a day, glipizide XL 10 mg a day, and metformin 1000 mg twice a day.   #3 hypertension, good control on lisinopril 10 mg a day and Lopressor 50 mg twice daily.   #4 Erythrocytosis.  He has OSA and is not wearing his CPAP. --> encouraged him to use his CPAP every night. Monitor hemoglobin today and will recheck again at next follow-up."  INTERIM HX: Labs last visit showed potassium 5.4 and GFR dropped from 61 to 50. I had him discontinue lisinopril.  Recheck of his potassium was 4.8 and GFR remained 52.  Home bp's--> none checked. No side effects from Eliquis at this time. No palpitations, racing heart, dizziness, or shortness of breath or chest pain.  ROS: no excessive bruising, no blood in stool or urine, no nosebleeds.  No headaches or visual abnormalities.  Past Medical History:  Diagnosis Date   CAD (coronary artery disease)    see PSH section for details   Carotid bruit 05/28/2010   Tortuosity of carotids on doppler u/s, no stenosis.   Cataract    Chronic diastolic heart failure (HCC)    Grd II DD   Colon cancer screening     cologuard POSITIVE 09/2017-->colonoscopy 12/19/17; multiple adenomatous polyps, recall 3 yrs.   COPD (chronic obstructive pulmonary disease) (HCC)    Diabetes mellitus    type 2- non insulin-requiring   DOE (dyspnea on exertion)    Cardiology w/u unrevealing except LVOT obstruction: suspect dyspnea due to HOCM, morbid obesity and OSA--stable as of 02/2017 cardiol f/u.   Fatty liver u/s and CT 2009   w/mildly elevated transaminases; u/s 12/2012 reconfirmed dx.  Hep B and C testing negative.   GERD (gastroesophageal reflux disease)    EDG 04-03-01, + distal esophageal stricture dilation   Gout    Heart murmur    Hiatal hernia    History of iron deficiency anemia 02/2019   Hb 12.9 02/2019->home hemoccults ordered but pt never returned these.  GI referral was done but pt did not return their call/unable to be contacted. Fall 2021 iron low but Hb normal->iron supp brought iron levels up (hemoccults neg x 3).   Hx of adenomatous colonic polyps 12/27/2017   Hyperlipidemia    Hypertension    severe LVH on echo   Hypertrophic obstructive cardiomyopathy (HOCM) (HCC) 2012   LVOT obstruction stable on echo 01/09/12 and again on f/u echo 09/29/14, 11/2016, 10/2017--with evidence of HOCM due to chordal systolic anterior motion of MV leaflet. Grd II DD.     Moderate persistent asthma 04/19/2014   Morbid obesity (HCC)    OSA on  CPAP    Could not tolerate CPAP, not surgical candidate per Dr. Suszanne Conners.  Restarted CPAP (auto titrate 5-15 with full facemask--Dr. Vassie Loll 2017.  Getting mask fitting/leak issues worked out as of 07/2016 pulm f/u.   Osteoarthritis, multiple sites    primarily knees and back.   Stricture and stenosis of esophagus    Venous insufficiency of both lower extremities     Past Surgical History:  Procedure Laterality Date   CARDIOVASCULAR STRESS TEST  2006; 04/27/10; 10/2017   Normal stress nuclear study, normal EF.  2019: EF 40%, inferior ischemia--intermediate risk study-->Dr. Jacinto [name] to do cath.    COLONOSCOPY  x 3   Most recent-->12/19/17 (after + cologuard) multiple adenomatous polyps-->recall 3 yrs.   LEFT HEART CATH AND CORONARY ANGIOGRAPHY N/A 01/06/2018   One area of 80% ostial stenosis--small and hard to visualize. No intervention.  DAPT for >1 yr recommended.  Procedure: LEFT HEART CATH AND CORONARY ANGIOGRAPHY;  Surgeon: Yates Decamp, MD;  Location: [facility];  Service: Cardiovascular;  Laterality: N/A;   NASAL SINUS SURGERY     Sleep study  07/2016   OSA; CPAP trial at 10 cm H20 recommended by pulm (Dr. Tommi Rumps Dios--[place] [facility]).   SPINE SURGERY  1970   ? Discectomy   TEE WITHOUT CARDIOVERSION  05/2010   Dr. Jacinto [name]; severe LVH, no valvular abnormalities   TRANSESOPHAGEAL ECHOCARDIOGRAM  06/06/10; 12/2011; 09/2014; 11/2016   LVOT obst with 135 mm Hg PG. HOCM. No AV obstruction to flow. No mitral regurg..  Repeat echo 01/09/12 no change.  2016: EF 60%, severe conc LV hypertrophy, normal global wall motion, LA mildly dilated, systolic anterior motion of mitral valve chord, mild MR, mild TR, mild pulm HTN--no signif change since 2013.  Repeat echo 11/2016 and 10/2017 no signif change compared to prior studies.   TRANSTHORACIC ECHOCARDIOGRAM  05/28/2010; 10/2017   2012; Normal LVEF, LVOT obst, lateral wall hypokin.  10/2017: EF 55%, hypertrophic CM, grd II dd, normal global wall motion. 02/2022 no change except now with severe LA dilation    Outpatient Medications Prior to Visit  Medication Sig Dispense Refill   apixaban (ELIQUIS) 5 MG TABS tablet Take 1 tablet (5 mg total) by mouth 2 (two) times daily. 60 tablet 6   aspirin 81 MG tablet Take 81 mg by mouth daily.     Cholecalciferol (VITAMIN D) 2000 UNITS tablet Take 2,000 Units by mouth daily.     diclofenac Sodium (VOLTAREN) 1 % GEL Apply 2 g topically 4 (four) times daily. 100 g 3   docusate sodium (COLACE) 100 MG capsule Take 100 mg by mouth every other day.     empagliflozin (JARDIANCE) 25 MG TABS tablet Take 1 tablet (25 mg  total) by mouth daily before breakfast. 90 tablet 3   fluticasone (FLONASE) 50 MCG/ACT nasal spray USE 2 SPRAYS IN BOTH NOSTRILS  DAILY AS NEEDED FOR ALLERGIES 64 g 1   Fluticasone Furoate (ARNUITY ELLIPTA) 200 MCG/ACT AEPB 1 puff qd 90 each 3   glucose blood (ONE TOUCH ULTRA TEST) test strip Use as instructed to check blood sugar.  DX 250.00 100 each prn   lisinopril (ZESTRIL) 10 MG tablet TAKE 1 TABLET BY MOUTH DAILY 35 tablet 9   metoprolol tartrate (LOPRESSOR) 50 MG tablet TAKE 1 TABLET BY MOUTH TWICE  DAILY 200 tablet 2   pantoprazole (PROTONIX) 40 MG tablet TAKE 1 TABLET BY MOUTH DAILY 90 tablet 1   PREVIDENT 5000 BOOSTER PLUS 1.1 % PSTE Place  onto teeth Nightly.     rosuvastatin (CRESTOR) 20 MG tablet TAKE 1 TABLET BY MOUTH AT  BEDTIME 90 tablet 1   traZODone (DESYREL) 50 MG tablet TAKE 1 TABLET(50 MG) BY MOUTH AT BEDTIME 30 tablet 1   verapamil (CALAN-SR) 120 MG CR tablet TAKE 1 TABLET BY MOUTH IN THE  MORNING 100 tablet 2   albuterol (PROAIR HFA) 108 (90 Base) MCG/ACT inhaler INHALE 2 PUFFS INTO THE LUNGS EVERY 4 HOURS AS NEEDED FOR WHEEZING (Patient not taking: Reported on 07/23/2022) 8.5 g 0   ondansetron (ZOFRAN) 8 MG tablet Take 1 tablet (8 mg total) by mouth every 8 (eight) hours as needed for nausea or vomiting. (Patient not taking: Reported on 05/08/2022) 20 tablet 0   ONE TOUCH LANCETS MISC Use as directed to check blood sugar.  DX 250.00 (Patient not taking: Reported on 07/23/2022) 200 each prn   allopurinol (ZYLOPRIM) 300 MG tablet TAKE 1 TABLET BY MOUTH TWICE  DAILY 180 tablet 1   empagliflozin (JARDIANCE) 25 MG TABS tablet Take 1 tablet (25 mg total) by mouth daily before breakfast. 90 tablet 3   glipiZIDE (GLUCOTROL XL) 10 MG 24 hr tablet TAKE 1 TABLET BY MOUTH DAILY 90 tablet 1   metFORMIN (GLUCOPHAGE) 1000 MG tablet TAKE 1 TABLET BY MOUTH TWICE  DAILY WITH MEALS 180 tablet 1   No facility-administered medications prior to visit.    No Known Allergies  Review of  Systems As per HPI  PE:    02/06/2023    9:40 AM 01/23/2023    8:32 AM 10/23/2022    8:59 AM  Vitals with BMI  Height 5\' 8"     Weight 255 lbs 13 oz 261 lbs 6 oz 261 lbs 3 oz  BMI 38.9    Systolic 113 120 161  Diastolic 71 80 83  Pulse 75 71 70     Physical Exam  Gen: Alert, well appearing.  Patient is oriented to person, place, time, and situation. AFFECT: pleasant, lucid thought and speech. Cardiovascular: Irregularly irregular rhythm, rate approximately 75.  No murmur.   LABS:  Last CBC Lab Results  Component Value Date   WBC 9.8 01/23/2023   HGB 17.3 (H) 01/23/2023   HCT 53.9 (H) 01/23/2023   MCV 94.1 01/23/2023   MCH 30.2 06/17/2020   RDW 15.3 01/23/2023   PLT 203.0 01/23/2023   Lab Results  Component Value Date   IRON 76 01/23/2023   TIBC 350 01/23/2023   FERRITIN 40 01/23/2023   Last metabolic panel Lab Results  Component Value Date   GLUCOSE 193 (H) 01/30/2023   NA 142 01/30/2023   K 4.8 01/30/2023   CL 106 01/30/2023   CO2 28 01/30/2023   BUN 19 01/30/2023   CREATININE 1.34 01/30/2023   GFR 51.65 (L) 01/30/2023   CALCIUM 9.7 01/30/2023   PROT 6.6 10/23/2022   ALBUMIN 4.5 10/23/2022   LABGLOB 2.0 06/17/2020   AGRATIO 2.2 06/17/2020   BILITOT 0.8 10/23/2022   ALKPHOS 54 10/23/2022   AST 29 10/23/2022   ALT 23 10/23/2022   Last lipids Lab Results  Component Value Date   CHOL 100 10/23/2022   HDL Jack.50 (L) 10/23/2022   LDLCALC 36 10/23/2022   TRIG 131.0 10/23/2022   CHOLHDL 3 10/23/2022   Last hemoglobin A1c Lab Results  Component Value Date   HGBA1C 7.1 (A) 01/23/2023   HGBA1C 7.1 01/23/2023   HGBA1C 7.1 (A) 01/23/2023   HGBA1C 7.1 (A) 01/23/2023   Last  thyroid functions Lab Results  Component Value Date   TSH 2.58 01/23/2023   IMPRESSION AND PLAN:  #1 new diagnosis atrial fibrillation, tolerating Eliquis 5 mg twice daily. Therapeutic expectations and side effect profile of medication discussed today.  Patient's questions  answered. Rate is controlled on Lopressor 50 mg twice daily and Calan SR 120 mg a day. Will obtain updated echocardiogram. He has follow-up with his cardiologist in December.  2.  Elevated serum creatinine. Renal function recovered a little bit when checked 1 week after discontinuation of his lisinopril. Basic metabolic panel today. Of note, he has no obstructive symptoms with voiding.  An After Visit Summary was printed and given to the patient.  FOLLOW UP: Return in about 3 months (around 05/09/2023) for routine chronic illness f/u.  Signed:  Santiago Bumpers, MD           02/06/2023

## 2023-02-10 ENCOUNTER — Telehealth: Payer: Self-pay

## 2023-02-10 NOTE — Telephone Encounter (Signed)
Patient returned call regarding lab results.  Patient aware that all labs are normal.  Patient does not have any questions or concerns at this time.

## 2023-02-18 ENCOUNTER — Telehealth: Payer: Self-pay

## 2023-02-18 NOTE — Telephone Encounter (Signed)
Type of forms received:  Patient Assistance for Medication  Routed to:  [employer] [facility]  Paperwork received by :  Annabelle Harman   Individual made aware of 5-7 business day turn around (Y/N): Y  Form completed and patient made aware of charges(Y/N): N   Faxed to :   Form location:   [place] inbox front office area

## 2023-02-21 ENCOUNTER — Other Ambulatory Visit: Payer: Self-pay | Admitting: Family Medicine

## 2023-02-26 ENCOUNTER — Telehealth: Payer: Self-pay | Admitting: *Deleted

## 2023-02-26 ENCOUNTER — Telehealth: Payer: Self-pay | Admitting: Family Medicine

## 2023-02-26 ENCOUNTER — Other Ambulatory Visit: Payer: Self-pay | Admitting: Family Medicine

## 2023-02-26 DIAGNOSIS — M1711 Unilateral primary osteoarthritis, right knee: Secondary | ICD-10-CM | POA: Diagnosis not present

## 2023-02-26 DIAGNOSIS — E119 Type 2 diabetes mellitus without complications: Secondary | ICD-10-CM

## 2023-02-26 MED ORDER — EMPAGLIFLOZIN 25 MG PO TABS: 25.0000 mg | ORAL_TABLET | Freq: Every day | ORAL | 3 refills | Status: DC

## 2023-02-26 NOTE — Telephone Encounter (Signed)
Name: Jack [name]  DOB: [birth year]  MRN: [id number]  Primary Cardiologist: None  Chart reviewed as part of pre-operative protocol coverage. The patient has an upcoming visit scheduled with Dr. Jacinto Halim on 04/11/23 at which time clearance can be addressed in case there are any issues that would impact surgical recommendations.   I added preop FYI to appointment note so that provider is aware to address at time of outpatient visit.  Per office protocol the cardiology provider should forward their finalized clearance decision and recommendations regarding antiplatelet therapy to the requesting party below.       I will route this message as FYI to requesting party and remove this message from the preop box as separate preop APP input not needed at this time.   Please call with any questions.  Napoleon Form, Leodis Rains, NP  02/26/2023, 1:01 PM

## 2023-02-26 NOTE — Telephone Encounter (Signed)
[facility] , faxed Surgical Clearance form  to be filled out by provider. Patient requested to send it back via Fax within ASAP. Document is located in providers tray at front office.Please advise at home [phone] [facility])

## 2023-02-26 NOTE — Telephone Encounter (Signed)
Spoke with pt's wife regarding forms. Forms faxed, a copy was made for our reference and wife will come by today and pick up original.

## 2023-02-26 NOTE — Telephone Encounter (Signed)
Pre-operative Risk Assessment    Patient Name: Jack [name]  DOB: 03-Sep-1945 MRN: [id number]  DATE OF LAST VISIT: 03/27/22 Nori Riis, NP DATE OF NEXT VISIT: 04/11/23 DR. GANJI    Request for Surgical Clearance    Procedure:   RIGHT TOTAL KNEE ARTHROPLASTY  Date of Surgery:  Clearance TBD                                 Surgeon:  DR. Marcene Corning Surgeon's Group or Practice Name:  [facility] Phone number:  [phone] Fax number:  [phone]   Type of Clearance Requested:   - Medical  - Pharmacy:  Hold Apixaban (Eliquis)     Type of Anesthesia:  Spinal   Additional requests/questions:    Elpidio Anis   02/26/2023, 12:52 PM

## 2023-02-26 NOTE — Telephone Encounter (Signed)
Patient's wife called to inquire about the form being dropped off. They company has called them to inquire about the form so that his medication can be covered. Please give them a call back to give a status update regarding form.  Best number to call is [phone]

## 2023-02-27 ENCOUNTER — Other Ambulatory Visit: Payer: Self-pay | Admitting: Family Medicine

## 2023-02-27 NOTE — Telephone Encounter (Signed)
Pt had OV 9/26 where fasting labs were done. Form has been placed in PCP office to sign.

## 2023-02-27 NOTE — Telephone Encounter (Signed)
CMA has form for provider. Please review if patient has had labs or recent RCI follow up in the last 3-4 months for completion. If he has not, appt will be needed for surgical clearance.

## 2023-03-06 ENCOUNTER — Ambulatory Visit (HOSPITAL_COMMUNITY): Payer: Medicare Other | Attending: Family Medicine

## 2023-03-06 ENCOUNTER — Encounter: Payer: Self-pay | Admitting: Family Medicine

## 2023-03-06 DIAGNOSIS — I422 Other hypertrophic cardiomyopathy: Secondary | ICD-10-CM | POA: Diagnosis not present

## 2023-03-06 DIAGNOSIS — I4891 Unspecified atrial fibrillation: Secondary | ICD-10-CM

## 2023-03-06 LAB — ECHOCARDIOGRAM COMPLETE: S' Lateral: 3 cm

## 2023-03-06 MED ORDER — PERFLUTREN LIPID MICROSPHERE
1.0000 mL | INTRAVENOUS | Status: AC | PRN
  Administered 2023-03-06: 3 mL via INTRAVENOUS

## 2023-03-11 ENCOUNTER — Ambulatory Visit: Payer: Self-pay | Admitting: Cardiology

## 2023-03-11 ENCOUNTER — Telehealth: Payer: Self-pay

## 2023-03-11 NOTE — Telephone Encounter (Signed)
Signed and put in box to go up front. Signed:  Santiago Bumpers, MD           03/11/2023

## 2023-03-11 NOTE — Progress Notes (Signed)
Echocardiogram 03/06/2023:   1. 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 The IVSd is 2.3 cm. There appears to be mid-cavitary obliteration during systole that is partially responsible for this gradient. Left ventricular ejection fraction, by estimation, is 65 to 70%. Left ventricular ejection fraction by PLAX is 66 %. The left ventricle has normal function. The left ventricle has no regional wall motion abnormalities. There is severe asymmetric left ventricular hypertrophy of the basal-septal segment. Left ventricular diastolic parameters are consistent with Grade I diastolic dysfunction (impaired relaxation).  2. Right ventricular systolic function is low normal. The right ventricular size is normal.  3. Left atrial size was moderately dilated.  4. The mitral valve is grossly normal. Trivial mitral valve regurgitation.  5. The aortic valve is tricuspid. Aortic valve regurgitation is trivial.  6. Aortic dilatation noted. There is mild dilatation of the ascending aorta, measuring 40 mm.  Comparison(s): Changes from prior study are noted. 03/12/2022: LVEF 60-65%, grade 2 DD.

## 2023-03-11 NOTE — Telephone Encounter (Signed)
Type of forms received: [employer] Handicap Placard  Routed to: [employer]  Paperwork received by :  Annabelle [facility]   Individual made aware of 5-7 business day turn around (Y/N):  Y  Form completed and patient made aware of charges(Y/N): N   Faxed to :   Form location:  form given to [facility]

## 2023-03-11 NOTE — Telephone Encounter (Signed)
Placed on PCP desk to review and sign, if appropriate.  

## 2023-03-11 NOTE — Telephone Encounter (Signed)
Pt's wife informed forms ready for pick up. Placed in brown folder at the front desk.

## 2023-04-07 DIAGNOSIS — M1731 Unilateral post-traumatic osteoarthritis, right knee: Secondary | ICD-10-CM | POA: Diagnosis not present

## 2023-04-07 DIAGNOSIS — R262 Difficulty in walking, not elsewhere classified: Secondary | ICD-10-CM | POA: Diagnosis not present

## 2023-04-07 DIAGNOSIS — M25661 Stiffness of right knee, not elsewhere classified: Secondary | ICD-10-CM | POA: Diagnosis not present

## 2023-04-11 ENCOUNTER — Ambulatory Visit: Payer: Medicare Other | Attending: Cardiology | Admitting: Cardiology

## 2023-04-11 ENCOUNTER — Encounter: Payer: Self-pay | Admitting: Cardiology

## 2023-04-11 VITALS — BP 132/80 | HR 86 | Resp 15 | Ht 68.0 in | Wt 258.0 lb

## 2023-04-11 DIAGNOSIS — I4819 Other persistent atrial fibrillation: Secondary | ICD-10-CM

## 2023-04-11 DIAGNOSIS — Z01810 Encounter for preprocedural cardiovascular examination: Secondary | ICD-10-CM

## 2023-04-11 DIAGNOSIS — I10 Essential (primary) hypertension: Secondary | ICD-10-CM

## 2023-04-11 DIAGNOSIS — I422 Other hypertrophic cardiomyopathy: Secondary | ICD-10-CM

## 2023-04-11 DIAGNOSIS — D751 Secondary polycythemia: Secondary | ICD-10-CM

## 2023-04-11 DIAGNOSIS — G4733 Obstructive sleep apnea (adult) (pediatric): Secondary | ICD-10-CM | POA: Diagnosis not present

## 2023-04-11 MED ORDER — AMIODARONE HCL 200 MG PO TABS: 200.0000 mg | ORAL_TABLET | ORAL | 2 refills | Status: DC

## 2023-04-11 NOTE — Patient Instructions (Addendum)
Medication Instructions:  Your physician has recommended you make the following change in your medication:  Start amiodarone 200 mg twice daily for 10 days and then decrease to 200 mg once daily   *If you need a refill on your cardiac medications before your next appointment, please call your pharmacy*   Lab Work: Have lab work done at [facility] on the first floor today--BMP and CBC If you have labs (blood work) drawn today and your tests are completely normal, you will receive your results only by: [employer] (if you have MyChart) OR A paper copy in the mail If you have any lab test that is abnormal or we need to change your treatment, we will call you to review the results.   Testing/Procedures:  Your physician has recommended that you have a Cardioversion (DCCV). Electrical Cardioversion uses a jolt of electricity to your heart either through paddles or wired patches attached to your chest. This is a controlled, usually prescheduled, procedure. Defibrillation is done under light anesthesia in the hospital, and you usually go home the day of the procedure. This is done to get your heart back into a normal rhythm. You are not awake for the procedure. Please see the instruction sheet given to you today. Scheduled for 04/15/23   Follow-Up: At [facility], you and your health needs are our priority.  As part of our continuing mission to provide you with exceptional heart care, we have created designated Provider Care Teams.  These Care Teams include your primary Cardiologist (physician) and Advanced Practice Providers (APPs -  Physician Assistants and Nurse Practitioners) who all work together to provide you with the care you need, when you need it.  We recommend signing up for the patient portal called "MyChart".  Sign up information is provided on this After Visit Summary.  MyChart is used to connect with patients for Virtual Visits (Telemedicine).  Patients are able to view  lab/test results, encounter notes, upcoming appointments, etc.  Non-urgent messages can be sent to your provider as well.   To learn more about what you can do with MyChart, go to [email or web address].    Your next appointment:   About 2 weeks after cardioversion on 04/15/23  Provider:   You will follow up in the Atrial Fibrillation Clinic located at [facility]. Your provider will be: Clint R. Fenton, PA-C or Lake Bells, PA-C    AFIB CLINIC INFORMATION:  The AFib Clinic is located on the [facility] at [address]  Patients should enter through Entrance "A" of [facility] and go to [facility] (immediately to the left of the entrance) to the 6th floor. Once you exit the elevator on the 6th floor check in with the Afib Clinic Registration Desk to the right of elevator. Valet parking is available at the entrance. Phone number: [phone]  Then 6 months with Dr Jacinto Halim Other Instructions     Dear Jack [name]  You are scheduled for a Cardioversion on Tuesday, December 17 with Dr. Jacques Navy.  Please arrive at the [facility] (Main Entrance A) at [facility]: [address] [place], [place] [address] at 12:30 PM (This time is 1 hour(s) before your procedure to ensure your preparation).   Free valet parking service is available. You will check in at ADMITTING.   *Please Note: You will receive a call the day before your procedure to confirm the appointment time. That time may have changed from the original  time based on the schedule for that day.*    DIET:  Nothing to eat or drink after midnight except a sip of water with medications (see medication instructions below)  MEDICATION INSTRUCTIONS: !!IF ANY NEW MEDICATIONS ARE STARTED AFTER TODAY, PLEASE NOTIFY YOUR PROVIDER AS SOON AS POSSIBLE!!  FYI: Medications such as Semaglutide (Ozempic, [place]), Tirzepatide (Mounjaro, Zepbound), Dulaglutide (Trulicity), etc ("GLP1 agonists") AND  Canagliflozin (Invokana), Dapagliflozin (Farxiga), Empagliflozin (Jardiance), Ertugliflozin (Steglatro), Bexagliflozin [employer]) or any combination with one of these drugs such as Invokamet (Canagliflozin/Metformin), Synjardy (Empagliflozin/Metformin), etc ("SGLT2 inhibitors") must be held around the time of a procedure. This is not a comprehensive list of all of these drugs. Please review all of your medications and talk to your provider if you take any one of these. If you are not sure, ask your provider.              :HOLD: Empagliflozin (Jardiance) for 3 days prior to the procedure. Last dose on Friday, December 13.  Continue taking your anticoagulant (blood thinner): Apixaban (Eliquis).  You will need to continue this after your procedure until you are told by your provider that it is safe to stop.    Do not take any diabetes medication the morning of the procedure.   LABS: Done on 12/13  FYI:  For your safety, and to allow [place] to monitor your vital signs accurately during the surgery/procedure we request: If you have artificial nails, gel coating, SNS etc, please have those removed prior to your surgery/procedure. Not having the nail coverings /polish removed may result in cancellation or delay of your surgery/procedure.  Your support person will be asked to wait in the waiting room during your procedure.  It is OK to have someone drop you off and come back when you are ready to be discharged.  You cannot drive after the procedure and will need someone to drive you home.  Bring your insurance cards.  *Special Note: Every effort is made to have your procedure done on time. Occasionally there are emergencies that occur at the hospital that may cause delays. Please be patient if a delay does occur.

## 2023-04-11 NOTE — Progress Notes (Signed)
Cardiology Office Note:  .   Date:  04/11/2023  ID:  Jack [name], DOB Nov 18, 1945, MRN [id number] PCP: Jeoffrey Massed, MD  [facility] Providers Cardiologist:  Yates Decamp, MD   History of Present Illness: .   Jack [name] is a 77 y.o. 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  On his regular office visit on 02/06/2023 with Dr. Nicoletta Ba he was found to be in atrial fibrillation and was started on Eliquis.  He now presents for follow-up.  Discussed the use of AI scribe software for clinical note transcription with the patient, who gave verbal consent to proceed.  History of Present Illness   In addition to AFib, the patient has severe sleep apnea, for which he uses a CPAP machine. The patient admits to having stopped using the CPAP machine for a period of about two to three weeks but has since resumed its use.  The patient also reports chronic leg swelling, which has not worsened recently.  He has chronic mild dyspnea states that has remained stable.  Denies any other symptoms of palpitations or fatigue.  No chest pain.  He is scheduled for a knee replacement surgery due to persistent knee pain, which he has been managing with over-the-counter pain relievers like Aleve and Tylenol. The patient's diabetes is reportedly well-controlled, and he has not noticed any significant changes in his overall health.   Review of Systems  Cardiovascular:  Positive for dyspnea on exertion (stable) and leg swelling (chronic and mild). Negative for chest pain.  Respiratory:  Positive for snoring (On coap).    Labs   Lab Results  Component Value Date   CHOL 100 10/23/2022   HDL 37.50 (L) 10/23/2022   LDLCALC 36 10/23/2022   TRIG 131.0 10/23/2022   CHOLHDL 3 10/23/2022   Lab Results  Component  Value Date   NA 142 02/06/2023   K 4.9 02/06/2023   CO2 25 02/06/2023   GLUCOSE 112 (H) 02/06/2023   BUN 16 02/06/2023   CREATININE 1.10 02/06/2023   CALCIUM 9.9 02/06/2023   GFR 65.44 02/06/2023   GFRNONAA 80 06/17/2020      Latest Ref Rng & Units 02/06/2023   10:07 AM 01/30/2023   11:13 AM 01/23/2023    9:01 AM  BMP  Glucose 70 - 99 mg/dL 606  301  601   BUN 6 - 23 mg/dL 16  19  22    Creatinine 0.40 - 1.50 mg/dL 0.93  2.35  5.73   Sodium 135 - 145 mEq/L 142  142  143   Potassium 3.5 - 5.1 mEq/L 4.9  4.8  5.4 No hemolysis seen   Chloride 96 - 112 mEq/L 109  106  108   CO2 19 - 32 mEq/L 25  28  28    Calcium 8.4 - 10.5 mg/dL 9.9  9.7  22.0       Latest Ref Rng & Units 01/23/2023    9:01 AM 07/23/2022    9:24 AM 12/06/2021    2:38 PM  CBC  WBC 4.0 - 10.5 K/uL 9.8  9.4  7.4   Hemoglobin 13.0 - 17.0 g/dL 25.4  27.0 Repeated and verified X2.  18.0 Called to [facility]   Hematocrit 39.0 - 52.0 % 53.9  54.2  54.6   Platelets 150.0 - 400.0  K/uL 203.0  155.0  120.0    Lab Results  Component Value Date   HGBA1C 7.1 (A) 01/23/2023   HGBA1C 7.1 01/23/2023   HGBA1C 7.1 (A) 01/23/2023   HGBA1C 7.1 (A) 01/23/2023    Lab Results  Component Value Date   TSH 2.58 01/23/2023    Physical Exam:   VS:  BP 132/80 Comment: Left arm  Pulse 86   Resp 15   Ht 5\' 8"  (1.727 m)   Wt 258 lb (117 kg)   SpO2 97%   BMI 39.23 kg/m    Wt Readings from Last 3 Encounters:  04/11/23 258 lb (117 kg)  02/06/23 255 lb 12.8 oz (116 kg)  01/23/23 261 lb 6.4 oz (118.6 kg)     Physical Exam Constitutional:      Appearance: He is morbidly obese.  Neck:     Vascular: Carotid bruit (bilateral) present. No JVD.  Cardiovascular:     Rate and Rhythm: Normal rate and regular rhythm.     Pulses: Intact distal pulses.     Heart sounds: Murmur heard.     Midsystolic murmur is present at the upper right sternal border radiating to the neck.     No gallop.  Pulmonary:     Effort: Pulmonary effort is  normal.     Breath sounds: Normal breath sounds.  Abdominal:     General: Bowel sounds are normal.     Palpations: Abdomen is soft.  Musculoskeletal:     Right lower leg: No edema.     Left lower leg: No edema.    Studies Reviewed: Marland Kitchen    Coronary angiogram 01/06/2018:  Mid LAD 20% stenosis, otherwise no significant disease. Normal LVEF. 140 mmHg intraventricular PG noted. LVEDP moderate to severely elevated.  ECHOCARDIOGRAM COMPLETE 03/06/2023  1. 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 The IVSd is 2.3 cm. There appears to be mid-cavitary obliteration during systole that is partially responsible for this gradient. Left ventricular ejection fraction, by estimation, is 65 to 70%. Left ventricular ejection fraction by PLAX is 66 %. The left ventricle has normal function. The left ventricle has no regional wall motion abnormalities. There is severe asymmetric left ventricular hypertrophy of the basal-septal segment. Left ventricular diastolic parameters are consistent with Grade I diastolic dysfunction (impaired relaxation). 2. Right ventricular systolic function is low normal. The right ventricular size is normal. 3. Left atrial size was moderately dilated. 4. The mitral valve is grossly normal. Trivial mitral valve regurgitation. 5. The aortic valve is tricuspid. Aortic valve regurgitation is trivial. 6. Aortic dilatation noted. There is mild dilatation of the ascending aorta, measuring 40 mm.   Comparison(s): Changes from prior study are noted. 03/12/2022: LVEF 60-65%, grade 2 DD.  EKG:    EKG Interpretation Date/Time:  Friday April 11 2023 08:41:53 EST Ventricular Rate:  95 PR Interval:  166 QRS Duration:  168 QT Interval:  430 QTC Calculation: 540 R Axis:   -84  Text Interpretation: EKG 04/11/2023: Atypical atrial flutter with ventricular rate of 95 bpm, left anterior fascicular block.  Right bundle branch block.  LVH with repolarization  abnormality, cannot exclude anterolateral ischemia.  Compared to 01/23/2023, underlying atrial fibrillation with controlled ventricular response.  Compared to prior EKGs, right bundle branch block is new. Confirmed by Delrae Rend [phone]) on 04/11/2023 9:05:43 AM    EKG 03/27/2022: Normal sinus rhythm at rate of 66 bpm. Left atrial enlargement. Left axis deviation. Right bundle branch  block, bifascicular block. LVH with repolarization abnormality.   Medications and allergies    No Known Allergies   Current Outpatient Medications:    albuterol (PROAIR HFA) 108 (90 Base) MCG/ACT inhaler, INHALE 2 PUFFS INTO THE LUNGS EVERY 4 HOURS AS NEEDED FOR WHEEZING, Disp: 8.5 g, Rfl: 0   allopurinol (ZYLOPRIM) 300 MG tablet, TAKE 1 TABLET BY MOUTH TWICE  DAILY, Disp: 180 tablet, Rfl: 0   amiodarone (PACERONE) 200 MG tablet, Take 1 tablet (200 mg total) by mouth as directed. 1 tab twice daily for 10 day then 1 tab daily, Disp: 45 tablet, Rfl: 2   apixaban (ELIQUIS) 5 MG TABS tablet, Take 1 tablet (5 mg total) by mouth 2 (two) times daily., Disp: 60 tablet, Rfl: 6   Cholecalciferol (VITAMIN D) 2000 UNITS tablet, Take 2,000 Units by mouth daily., Disp: , Rfl:    diclofenac Sodium (VOLTAREN) 1 % GEL, Apply 2 g topically 4 (four) times daily. (Patient taking differently: Apply 2 g topically 4 (four) times daily as needed (joint pain).), Disp: 100 g, Rfl: 3   docusate sodium (COLACE) 100 MG capsule, Take 100 mg by mouth every other day., Disp: , Rfl:    empagliflozin (JARDIANCE) 25 MG TABS tablet, Take 1 tablet (25 mg total) by mouth daily before breakfast., Disp: 90 tablet, Rfl: 3   fluticasone (FLONASE) 50 MCG/ACT nasal spray, USE 2 SPRAYS IN BOTH NOSTRILS  DAILY AS NEEDED FOR ALLERGIES, Disp: 64 g, Rfl: 1   Fluticasone Furoate (ARNUITY ELLIPTA) 200 MCG/ACT AEPB, 1 puff qd, Disp: 90 each, Rfl: 3   glipiZIDE (GLUCOTROL XL) 10 MG 24 hr tablet, TAKE 1 TABLET BY MOUTH DAILY, Disp: 90 tablet, Rfl: 0   glucose blood  (ONE TOUCH ULTRA TEST) test strip, Use as instructed to check blood sugar.  DX 250.00, Disp: 100 each, Rfl: prn   metFORMIN (GLUCOPHAGE) 1000 MG tablet, TAKE 1 TABLET BY MOUTH TWICE  DAILY WITH MEALS, Disp: 180 tablet, Rfl: 0   metoprolol tartrate (LOPRESSOR) 50 MG tablet, TAKE 1 TABLET BY MOUTH TWICE  DAILY, Disp: 200 tablet, Rfl: 2   ondansetron (ZOFRAN) 8 MG tablet, Take 1 tablet (8 mg total) by mouth every 8 (eight) hours as needed for nausea or vomiting., Disp: 20 tablet, Rfl: 0   ONE TOUCH LANCETS MISC, Use as directed to check blood sugar.  DX 250.00, Disp: 200 each, Rfl: prn   pantoprazole (PROTONIX) 40 MG tablet, TAKE 1 TABLET BY MOUTH DAILY (Patient taking differently: Take 40 mg by mouth every other day.), Disp: 90 tablet, Rfl: 0   rosuvastatin (CRESTOR) 20 MG tablet, TAKE 1 TABLET BY MOUTH AT  BEDTIME, Disp: 90 tablet, Rfl: 0   traZODone (DESYREL) 50 MG tablet, TAKE 1 TABLET(50 MG) BY MOUTH AT BEDTIME (Patient taking differently: Take 50 mg by mouth at bedtime as needed for sleep.), Disp: 30 tablet, Rfl: 1   verapamil (CALAN-SR) 120 MG CR tablet, TAKE 1 TABLET BY MOUTH IN THE  MORNING, Disp: 100 tablet, Rfl: 2   acetaminophen (TYLENOL) 650 MG CR tablet, Take 1,300 mg by mouth every 8 (eight) hours as needed for pain., Disp: , Rfl:    cetirizine (ZYRTEC) 10 MG tablet, Take 10 mg by mouth daily as needed for allergies., Disp: , Rfl:    Chlorpheniramine-Pseudoeph 4-60 MG TABS, Take 1 tablet by mouth daily as needed (allergies). [facility] Allergy Relief, Disp: , Rfl:    ASSESSMENT AND PLAN: .      ICD-10-CM   1. Persistent  atrial fibrillation (HCC)  I48.19 amiodarone (PACERONE) 200 MG tablet    Basic Metabolic Panel (BMET)    CBC    CANCELED: ECHOCARDIOGRAM COMPLETE    2. Hypertrophic cardiomyopathy (HCC)  I42.2 Basic Metabolic Panel (BMET)    CANCELED: ECHOCARDIOGRAM COMPLETE    3. Primary hypertension  I10 Basic Metabolic Panel (BMET)    4. OSA (obstructive sleep apnea)  G47.33      5. 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 Pre-operative cardiovascular examination: Rt TKA  Z01.810 EKG 12-Lead    Basic Metabolic Panel (BMET)    CBC     Click Here to Calculate/Change CHADS2VASc Score The patient's CHADS2-VASc score is 5, indicating a 7.2% annual risk of stroke.  Therefore, anticoagulation is recommended.   CHF History: Yes HTN History: Yes Diabetes History: Yes Stroke History: No Vascular Disease History: No   Assessment and Plan    Atrial Fibrillation New diagnosis of atrial fibrillation with controlled ventricular response. Patient is currently on Eliquis. Discussed the risk of stroke and the importance of anticoagulation. -Continue Eliquis.  Discontinue aspirin. -Plan for cardioversion to restore sinus rhythm. -Start Amiodarone 200 mg, two tablets daily for 10 days, then one tablet daily.  Patient has HOCM and also has moderately dilated left atrium even prior to atrial fibrillation onset hence chances that he will maintain sinus rhythm in view of morbid obesity, OSA, hypertension would be less without AAD.  Hypertrophic obstructive cardiomyopathy Patient remained stable with regard to his symptoms of dyspnea, no clinical evidence of heart failure, although he has new onset atrial fibrillation it is fortunate that he has not developed any symptoms of heart failure.  I would like to reduce the heart rate to keep LVOT obstruction to the minimum.  Knee Pain Patient is scheduled for knee replacement surgery. Discussed the need to postpone surgery due to new diagnosis of atrial fibrillation and planned cardioversion. -Postpone knee replacement surgery for at least two months.  Sleep Apnea Patient has severe sleep apnea and hypoxemia. Discussed the importance of consistent use of CPAP. -Encouraged consistent use of CPAP.  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  Importance discussed.  As patient has been on anticoagulation for >4 weeks, we will go ahead and schedule him for cardioversion.  Schedule for Direct current cardioversion. I have discussed regarding risks benefits rate control vs rhythm control with the patient. Patient understands cardiac arrest and need for CPR, aspiration pneumonia, but not limited to these. Patient is willing.    Follow-up -Order labs. -Post-cardioversion, patient to see a PA in the AFib clinic. -If sinus rhythm is restored or rate is controlled, patient can proceed with knee surgery 4 weeks post-cardioversion. -Return visit in 6 months if no complications arise.      Informed Consent   Shared Decision Making/Informed Consent The risks (stroke, cardiac arrhythmias rarely resulting in the need for a temporary or permanent pacemaker, skin irritation or burns and complications associated with conscious sedation including aspiration, arrhythmia, respiratory failure and death), benefits (restoration of normal sinus rhythm) and alternatives of a direct current cardioversion were explained in detail to Mr. Companion and he agrees to proceed.        Signed,  Yates Decamp, MD, [facility] 04/11/2023,  1:13 PM [facility] [address] [place], [place] [address] Phone: [phone]. Fax:  [phone]

## 2023-04-12 LAB — CBC
Hematocrit: 56.4 % — ABNORMAL HIGH (ref 37.5–51.0)
Hemoglobin: 18.7 g/dL — ABNORMAL HIGH (ref 13.0–17.7)
MCH: 30.8 pg (ref 26.6–33.0)
MCHC: 33.2 g/dL (ref 31.5–35.7)
MCV: 93 fL (ref 79–97)
Platelets: 183 10*3/uL (ref 150–450)
RBC: 6.07 x10E6/uL — ABNORMAL HIGH (ref 4.14–5.80)
RDW: 14.9 % (ref 11.6–15.4)
WBC: 10 10*3/uL (ref 3.4–10.8)

## 2023-04-12 LAB — BASIC METABOLIC PANEL
BUN/Creatinine Ratio: 18 (ref 10–24)
BUN: 25 mg/dL (ref 8–27)
CO2: 24 mmol/L (ref 20–29)
Calcium: 10.2 mg/dL (ref 8.6–10.2)
Chloride: 105 mmol/L (ref 96–106)
Creatinine, Ser: 1.4 mg/dL — ABNORMAL HIGH (ref 0.76–1.27)
Glucose: 154 mg/dL — ABNORMAL HIGH (ref 70–99)
Potassium: 4.8 mmol/L (ref 3.5–5.2)
Sodium: 144 mmol/L (ref 134–144)
eGFR: 52 mL/min/{1.73_m2} — ABNORMAL LOW (ref >59)

## 2023-04-13 NOTE — Progress Notes (Signed)
His Cr. Is up, probably could be from new A. Fib. PCP aware and will follow up with repeat BMP in 4-6 weeks

## 2023-04-13 NOTE — H&P (View-Only) (Signed)
His Cr. Is up, probably could be from new A. Fib. PCP aware and will follow up with repeat BMP in 4-6 weeks

## 2023-04-14 NOTE — Progress Notes (Signed)
Unable to reach patient about procedure, but was able to leave a detailed message. Stated that the patient needed to arrive at the hospital at 1200, remain NPO after 0000, needs to have a ride home and a responsible adult to stay with them for 24 hours after the procedure. Instructed the patient to call back if they had any questions.

## 2023-04-15 ENCOUNTER — Other Ambulatory Visit: Payer: Self-pay

## 2023-04-15 ENCOUNTER — Ambulatory Visit (HOSPITAL_COMMUNITY): Payer: Medicare Other | Admitting: Anesthesiology

## 2023-04-15 ENCOUNTER — Encounter (HOSPITAL_COMMUNITY)
Admission: RE | Disposition: A | Discharge status: Home / Self Care | Payer: Self-pay | Source: Home / Self Care | Attending: Internal Medicine

## 2023-04-15 ENCOUNTER — Ambulatory Visit (HOSPITAL_COMMUNITY)
Admission: RE | Admit: 2023-04-15 | Discharge: 2023-04-15 | Disposition: A | Discharge status: Home / Self Care | Payer: Medicare Other | Attending: Internal Medicine | Admitting: Internal Medicine

## 2023-04-15 DIAGNOSIS — J449 Chronic obstructive pulmonary disease, unspecified: Secondary | ICD-10-CM | POA: Diagnosis not present

## 2023-04-15 DIAGNOSIS — R0609 Other forms of dyspnea: Secondary | ICD-10-CM | POA: Insufficient documentation

## 2023-04-15 DIAGNOSIS — Z87891 Personal history of nicotine dependence: Secondary | ICD-10-CM | POA: Diagnosis not present

## 2023-04-15 DIAGNOSIS — I4891 Unspecified atrial fibrillation: Secondary | ICD-10-CM | POA: Diagnosis not present

## 2023-04-15 DIAGNOSIS — E119 Type 2 diabetes mellitus without complications: Secondary | ICD-10-CM | POA: Diagnosis not present

## 2023-04-15 DIAGNOSIS — Z79899 Other long term (current) drug therapy: Secondary | ICD-10-CM | POA: Insufficient documentation

## 2023-04-15 DIAGNOSIS — I10 Essential (primary) hypertension: Secondary | ICD-10-CM | POA: Insufficient documentation

## 2023-04-15 DIAGNOSIS — G4730 Sleep apnea, unspecified: Secondary | ICD-10-CM | POA: Diagnosis not present

## 2023-04-15 DIAGNOSIS — K219 Gastro-esophageal reflux disease without esophagitis: Secondary | ICD-10-CM | POA: Insufficient documentation

## 2023-04-15 DIAGNOSIS — Z006 Encounter for examination for normal comparison and control in clinical research program: Secondary | ICD-10-CM

## 2023-04-15 DIAGNOSIS — I2510 Atherosclerotic heart disease of native coronary artery without angina pectoris: Secondary | ICD-10-CM | POA: Diagnosis not present

## 2023-04-15 HISTORY — PX: CARDIOVERSION: EP1203

## 2023-04-15 LAB — GLUCOSE, CAPILLARY: Glucose-Capillary: 149 mg/dL — ABNORMAL HIGH (ref 70–99)

## 2023-04-15 SURGERY — CARDIOVERSION (CATH LAB)
Anesthesia: General

## 2023-04-15 MED ORDER — PROPOFOL 10 MG/ML IV BOLUS
INTRAVENOUS | Status: DC | PRN
  Administered 2023-04-15: 100 mg via INTRAVENOUS

## 2023-04-15 MED ORDER — SODIUM CHLORIDE 0.9% FLUSH: 10.0000 mL | Freq: Two times a day (BID) | INTRAVENOUS | Status: DC

## 2023-04-15 MED ORDER — PHENYLEPHRINE HCL (PRESSORS) 10 MG/ML IV SOLN
INTRAVENOUS | Status: DC | PRN
  Administered 2023-04-15: 80 ug via INTRAVENOUS

## 2023-04-15 SURGICAL SUPPLY — 1 items: PAD DEFIB RADIO PHYSIO CONN (PAD) ×1 IMPLANT

## 2023-04-15 NOTE — Anesthesia Preprocedure Evaluation (Addendum)
Anesthesia Evaluation  Patient identified by MRN, date of birth, ID band Patient awake    Reviewed: Allergy & Precautions, NPO status , Patient's Chart, lab work & pertinent test results, reviewed documented beta blocker date and time   Airway Mallampati: III  TM Distance: >3 FB Neck ROM: Full    Dental  (+) Dental Advisory Given, Missing   Pulmonary shortness of breath, asthma , sleep apnea , COPD, former smoker   Pulmonary exam normal breath sounds clear to auscultation       Cardiovascular hypertension, Pt. on home beta blockers and Pt. on medications + CAD and + DOE  + dysrhythmias + Valvular Problems/Murmurs  Rhythm:Irregular Rate:Normal  Echo 02/2023  1. 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 The IVSd is 2.3 cm. There appears to be mid-cavitary obliteration during systole that is partially responsible for this gradient. Left ventricular ejection fraction, by estimation,  is 65 to 70%. Left ventricular ejection fraction by PLAX is 66 %. The left ventricle has normal function. The left ventricle has no regional wall motion abnormalities. There is severe asymmetric left ventricular hypertrophy of the basal-septal segment. Left ventricular diastolic parameters are consistent with Grade I diastolic dysfunction (impaired relaxation).   2. Right ventricular systolic function is low normal. The right ventricular size is normal.   3. Left atrial size was moderately dilated.   4. The mitral valve is grossly normal. Trivial mitral valve regurgitation.   5. The aortic valve is tricuspid. Aortic valve regurgitation is trivial.   6. Aortic dilatation noted. There is mild dilatation of the ascending aorta, measuring 40 mm.     Neuro/Psych negative neurological ROS     GI/Hepatic Neg liver ROS, hiatal hernia,GERD  ,,  Endo/Other  diabetes  Class 3 obesity  Renal/GU negative Renal ROS      Musculoskeletal  (+) Arthritis ,    Abdominal  (+) + obese  Peds  Hematology negative hematology ROS (+)   Anesthesia Other Findings   Reproductive/Obstetrics                             Anesthesia Physical Anesthesia Plan  ASA: 4  Anesthesia Plan: General   Post-op Pain Management:    Induction: Intravenous  PONV Risk Score and Plan: 2 and Treatment may vary due to age or medical condition, Propofol infusion and TIVA  Airway Management Planned: Natural Airway  Additional Equipment:   Intra-op Plan:   Post-operative Plan:   Informed Consent: I have reviewed the patients History and Physical, chart, labs and discussed the procedure including the risks, benefits and alternatives for the proposed anesthesia with the patient or authorized representative who has indicated his/her understanding and acceptance.     Dental advisory given  Plan Discussed with: CRNA  Anesthesia Plan Comments:         Anesthesia Quick Evaluation

## 2023-04-15 NOTE — Transfer of Care (Signed)
Immediate Anesthesia Transfer of Care Note  Patient: Jack [name]  Procedure(s) Performed: CARDIOVERSION  Patient Location: Cath Lab  Anesthesia Type:General  Level of Consciousness: awake, alert , and oriented  Airway & Oxygen Therapy: Patient Spontanous Breathing and Patient connected to nasal cannula oxygen  Post-op Assessment: Report given to RN and Post -op Vital signs reviewed and stable  Post vital signs: Reviewed and stable  Last Vitals:  Vitals Value Taken Time  BP    Temp    Pulse 77 04/15/23 1305  Resp 17 04/15/23 1305  SpO2 94 % 04/15/23 1305  Vitals shown include unfiled device data.  Last Pain:  Vitals:   04/15/23 1204  TempSrc: Temporal  PainSc: 0-No pain         Complications: No notable events documented.

## 2023-04-15 NOTE — CV Procedure (Signed)
Procedure: Electrical Cardioversion Indications:  Atrial Fibrillation  Procedure Details:  Consent: Risks of procedure as well as the alternatives and risks of each were explained to the (patient/caregiver).  Consent for procedure obtained.  Time Out: Verified patient identification, verified procedure, site/side was marked, verified correct patient position, special equipment/implants available, medications/allergies/relevent history reviewed, required imaging and test results available. PERFORMED.  Patient placed on cardiac monitor, pulse oximetry, supplemental oxygen as necessary.  Sedation given:  propofol per anesthesia Pacer pads placed anterior chest.  Cardioverted 1 time(s).  Cardioversion with synchronized biphasic 200J shock.  Evaluation: Findings: Post procedure EKG shows: NSR Complications: None Patient did tolerate procedure well.  Time Spent Directly with the Patient:  20 minutes   Parke Poisson 04/15/2023, 1:12 PM

## 2023-04-15 NOTE — Research (Signed)
[facility] Cardioversion Informed Consent   Subject Name: Jack [name]  Subject met inclusion and exclusion criteria.  The informed consent form, study requirements and expectations were reviewed with the subject and questions and concerns were addressed prior to the signing of the consent form.  The subject verbalized understanding of the trial requirements.  The subject agreed to participate in the [facility] Cardioversion trial and signed the informed consent on 17/Dec/2024.  The informed consent was obtained prior to performance of any protocol-specific procedures for the subject.  A copy of the signed informed consent was given to the subject and a copy was placed in the subject's medical record.   Kandis Mannan Tywanna Seifer

## 2023-04-15 NOTE — Anesthesia Postprocedure Evaluation (Signed)
Anesthesia Post Note  Patient: Jack [name]  Procedure(s) Performed: CARDIOVERSION     Patient location during evaluation: Cath Lab Anesthesia Type: General Level of consciousness: sedated and patient cooperative Pain management: pain level controlled Vital Signs Assessment: post-procedure vital signs reviewed and stable Respiratory status: spontaneous breathing Cardiovascular status: stable Anesthetic complications: no   No notable events documented.  Last Vitals:  Vitals:   04/15/23 1330 04/15/23 1340  BP: (!) 151/85 (!) 146/87  Pulse: 65 64  Resp: 18 14  Temp:    SpO2: 96% 96%    Last Pain:  Vitals:   04/15/23 1321  TempSrc: Temporal  PainSc: 0-No pain                 Lewie Loron

## 2023-04-15 NOTE — Interval H&P Note (Signed)
History and Physical Interval Note:  04/15/2023 12:50 PM  Jack [name]  has presented today for surgery, with the diagnosis of AFIB.  The various methods of treatment have been discussed with the patient and family. After consideration of risks, benefits and other options for treatment, the patient has consented to  Procedure(s): CARDIOVERSION (N/A) as a surgical intervention.  The patient's history has been reviewed, patient examined, no change in status, stable for surgery.  I have reviewed the patient's chart and labs.  Questions were answered to the patient's satisfaction.     Parke Poisson

## 2023-04-16 ENCOUNTER — Encounter (HOSPITAL_COMMUNITY): Payer: Self-pay | Admitting: Internal Medicine

## 2023-04-25 ENCOUNTER — Other Ambulatory Visit: Payer: Self-pay | Admitting: Orthopaedic Surgery

## 2023-04-25 DIAGNOSIS — M25661 Stiffness of right knee, not elsewhere classified: Secondary | ICD-10-CM | POA: Diagnosis not present

## 2023-04-25 DIAGNOSIS — M1731 Unilateral post-traumatic osteoarthritis, right knee: Secondary | ICD-10-CM | POA: Diagnosis not present

## 2023-04-25 DIAGNOSIS — R262 Difficulty in walking, not elsewhere classified: Secondary | ICD-10-CM | POA: Diagnosis not present

## 2023-04-27 ENCOUNTER — Other Ambulatory Visit: Payer: Self-pay | Admitting: Family Medicine

## 2023-05-03 ENCOUNTER — Other Ambulatory Visit: Payer: Self-pay | Admitting: Family Medicine

## 2023-05-05 ENCOUNTER — Ambulatory Visit (HOSPITAL_COMMUNITY)
Admission: RE | Admit: 2023-05-05 | Discharge: 2023-05-05 | Disposition: A | Discharge status: Home / Self Care | Payer: Medicare Other | Source: Ambulatory Visit | Attending: Physician Assistant | Admitting: Physician Assistant

## 2023-05-05 VITALS — BP 140/60 | HR 50 | Ht 68.0 in | Wt 260.6 lb

## 2023-05-05 DIAGNOSIS — I5032 Chronic diastolic (congestive) heart failure: Secondary | ICD-10-CM | POA: Diagnosis not present

## 2023-05-05 DIAGNOSIS — Z7901 Long term (current) use of anticoagulants: Secondary | ICD-10-CM | POA: Diagnosis not present

## 2023-05-05 DIAGNOSIS — D6869 Other thrombophilia: Secondary | ICD-10-CM | POA: Insufficient documentation

## 2023-05-05 DIAGNOSIS — Z79899 Other long term (current) drug therapy: Secondary | ICD-10-CM | POA: Diagnosis not present

## 2023-05-05 DIAGNOSIS — I4892 Unspecified atrial flutter: Secondary | ICD-10-CM | POA: Diagnosis not present

## 2023-05-05 DIAGNOSIS — Z7984 Long term (current) use of oral hypoglycemic drugs: Secondary | ICD-10-CM | POA: Diagnosis not present

## 2023-05-05 DIAGNOSIS — I110 Hypertensive heart disease with heart failure: Secondary | ICD-10-CM | POA: Insufficient documentation

## 2023-05-05 DIAGNOSIS — I4891 Unspecified atrial fibrillation: Secondary | ICD-10-CM

## 2023-05-05 DIAGNOSIS — G4733 Obstructive sleep apnea (adult) (pediatric): Secondary | ICD-10-CM | POA: Diagnosis not present

## 2023-05-05 DIAGNOSIS — E119 Type 2 diabetes mellitus without complications: Secondary | ICD-10-CM | POA: Insufficient documentation

## 2023-05-05 DIAGNOSIS — I4819 Other persistent atrial fibrillation: Secondary | ICD-10-CM | POA: Diagnosis not present

## 2023-05-05 NOTE — Progress Notes (Signed)
 Primary Care Physician: Candise Aleene DEL, MD Primary Cardiologist: Gordy Bergamo, MD Electrophysiologist: None     Referring Physician: Dr. Bergamo Jack [name] [place]wood is a 78 y.o. 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 Patient diagnosed with Afib 12/2022 by PCP and started on Eliquis . Seen by Dr. Bergamo on 04/11/23 noted to be in atypical atrial flutter and started on amiodarone . He underwent successful DCCV on 04/15/23. He is scheduled for right knee arthroplasty on 05/20/23. Patient is on Eliquis  5 mg BID for a CHADS2VASC score of 5.  On evaluation today, he is currently in NSR. He is taking amiodarone  200 mg once daily. He is nervous about his knee surgery. He has not missed any doses of Eliquis  5 mg BID.   Today, he denies symptoms of palpitations, chest pain, shortness of breath, orthopnea, PND, lower extremity edema, dizziness, presyncope, syncope, snoring, daytime somnolence, bleeding, or neurologic sequela. The patient is tolerating medications without difficulties and is otherwise without complaint today.    Atrial Fibrillation Risk Factors:  he does have symptoms or diagnosis of sleep apnea. he is compliant with CPAP therapy.  he has a BMI of Body mass index is 39.62 kg/m.SABRA Filed Weights   05/05/23 1109  Weight: 118.2 kg    Current Outpatient Medications  Medication Sig Dispense Refill   acetaminophen  (TYLENOL ) 650 MG CR tablet Take 1,300 mg by mouth as needed for pain.     albuterol  (PROAIR  HFA) 108 (90 Base) MCG/ACT inhaler INHALE 2 PUFFS INTO THE LUNGS EVERY 4 HOURS AS NEEDED FOR WHEEZING 8.5 g 0   allopurinol  (ZYLOPRIM ) 300 MG tablet TAKE 1 TABLET BY MOUTH TWICE  DAILY 180 tablet 0   amiodarone  (PACERONE )  200 MG tablet Take 1 tablet (200 mg total) by mouth as directed. 1 tab twice daily for 10 day then 1 tab daily (Patient taking differently: Take 200 mg by mouth daily in the afternoon.) 45 tablet 2   apixaban  (ELIQUIS ) 5 MG TABS tablet Take 1 tablet (5 mg total) by mouth 2 (two) times daily. 60 tablet 6   cetirizine (ZYRTEC) 10 MG tablet Take 10 mg by mouth daily as needed for allergies.     Chlorpheniramine-Pseudoeph 4-60 MG TABS Take 1 tablet by mouth daily as needed (allergies). [facility]     Cholecalciferol (VITAMIN D) 2000 UNITS tablet Take 2,000 Units by mouth daily.     diclofenac  Sodium (VOLTAREN ) 1 % GEL Apply 2 g topically 4 (four) times daily. (Patient taking differently: Apply 2 g topically 4 (four) times daily as needed (joint pain).) 100 g 3   docusate sodium  (COLACE) 100 MG capsule Take 100 mg by mouth every other day.     empagliflozin  (JARDIANCE ) 25 MG TABS tablet Take 1 tablet (25 mg total) by mouth daily before breakfast. 90 tablet 3   fluticasone  (FLONASE ) 50 MCG/ACT nasal spray USE 2 SPRAYS IN BOTH NOSTRILS  DAILY AS NEEDED FOR ALLERGIES 64 g 1   Fluticasone  Furoate (ARNUITY ELLIPTA ) 200 MCG/ACT AEPB 1 puff qd (Patient taking differently: Only takes as needed) 90 each 3   glipiZIDE  (GLUCOTROL  XL) 10 MG 24 hr tablet TAKE 1 TABLET BY MOUTH DAILY 90 tablet 0   glucose blood (ONE TOUCH ULTRA  TEST) test strip Use as instructed to check blood sugar.  DX 250.00 100 each prn   metFORMIN  (GLUCOPHAGE ) 1000 MG tablet TAKE 1 TABLET BY MOUTH TWICE  DAILY WITH MEALS 180 tablet 0   metoprolol  tartrate (LOPRESSOR ) 50 MG tablet TAKE 1 TABLET BY MOUTH TWICE  DAILY 200 tablet 2   ondansetron  (ZOFRAN ) 8 MG tablet Take 1 tablet (8 mg total) by mouth every 8 (eight) hours as needed for nausea or vomiting. 20 tablet 0   ONE TOUCH LANCETS MISC Use as directed to check blood sugar.  DX 250.00 200 each prn   pantoprazole  (PROTONIX ) 40 MG tablet TAKE 1 TABLET BY MOUTH DAILY (Patient taking  differently: Take 40 mg by mouth every other day.) 90 tablet 0   rosuvastatin  (CRESTOR ) 20 MG tablet TAKE 1 TABLET BY MOUTH AT  BEDTIME 90 tablet 0   traZODone  (DESYREL ) 50 MG tablet TAKE 1 TABLET(50 MG) BY MOUTH AT BEDTIME (Patient taking differently: Take 50 mg by mouth at bedtime as needed for sleep.) 30 tablet 1   verapamil  (CALAN -SR) 120 MG CR tablet TAKE 1 TABLET BY MOUTH IN THE  MORNING 100 tablet 2   No current facility-administered medications for this encounter.    Atrial Fibrillation Management history:  Previous antiarrhythmic drugs: amiodarone  Previous cardioversions: 04/15/23 Previous ablations: none Anticoagulation history: Eliquis  5 mg BID   ROS- All systems are reviewed and negative except as per the HPI above.  Physical Exam: BP (!) 140/60   Pulse (!) 50   Ht 5' 8 (1.727 m)   Wt 118.2 kg   BMI 39.62 kg/m   GEN: Well nourished, well developed in no acute distress NECK: No JVD; No carotid bruits CARDIAC: Regular bradycardic rate and rhythm, 2/6 systolic murmur; no rubs orgallops RESPIRATORY:  Clear to auscultation without rales, wheezing or rhonchi  ABDOMEN: Soft, non-tender, non-distended EXTREMITIES:  No edema; No deformity   EKG today demonstrates  Vent. rate 50 BPM PR interval 220 ms QRS duration 150 ms QT/QTcB 506/461 ms P-R-T axes 50 -76 83 Sinus bradycardia with sinus arrhythmia with 1st degree A-V block Right bundle branch block Left anterior fascicular block Bifascicular block Left ventricular hypertrophy with repolarization abnormality ( R in aVL ) Abnormal ECG When compared with ECG of 15-Apr-2023 13:21, PREVIOUS ECG IS PRESENT  Echo 03/06/23:  1. 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  The IVSd is 2.3 cm. There appears to be mid-cavitary obliteration during  systole that is partially responsible for this gradient. Left ventricular  ejection fraction, by estimation,  is 65 to 70%. Left ventricular ejection  fraction by PLAX is 66 %. The left  ventricle has normal function. The left ventricle has no regional wall  motion abnormalities. There is severe asymmetric left ventricular  hypertrophy of the basal-septal segment.  Left ventricular diastolic parameters are consistent with Grade I  diastolic dysfunction (impaired relaxation).   2. Right ventricular systolic function is low normal. The right  ventricular size is normal.   3. Left atrial size was moderately dilated.   4. The mitral valve is grossly normal. Trivial mitral valve  regurgitation.   5. The aortic valve is tricuspid. Aortic valve regurgitation is trivial.   6. Aortic dilatation noted. There is mild dilatation of the ascending  aorta, measuring 40 mm.   Comparison(s): Changes from prior study are noted. 03/12/2022: LVEF  60-65%, grade 2 DD.   ASSESSMENT & PLAN CHA2DS2-VASc Score = 5  The patient's score is based upon: CHF History: 1 HTN History: 1 Diabetes History: 1 Stroke History: 0 Vascular Disease History: 0 Age Score: 2 Gender Score: 0       ASSESSMENT AND PLAN: Persistent Atrial Fibrillation (ICD10:  I48.19) / atrial flutter The patient's CHA2DS2-VASc score is 5, indicating a 7.2% annual risk of stroke.   S/p successful DCCV on 04/15/23.  He is currently in NSR. Qtc stable. Continue amiodarone  200 mg daily.   Secondary Hypercoagulable State (ICD10:  D68.69) The patient is at significant risk for stroke/thromboembolism based upon his CHA2DS2-VASc Score of 5.  Continue Apixaban  (Eliquis ).   He is scheduled for right knee arthroplasty on 05/20/23. He is okay to temporarily hold anticoagulation 4 weeks from date of cardioversion.      Follow up as scheduled with Dr. Ladona.    Terra Pac, PA-C  [facility] [address] [place], [place] [address] [phone]

## 2023-05-06 DIAGNOSIS — R262 Difficulty in walking, not elsewhere classified: Secondary | ICD-10-CM | POA: Diagnosis not present

## 2023-05-06 DIAGNOSIS — M25661 Stiffness of right knee, not elsewhere classified: Secondary | ICD-10-CM | POA: Diagnosis not present

## 2023-05-06 DIAGNOSIS — M1731 Unilateral post-traumatic osteoarthritis, right knee: Secondary | ICD-10-CM | POA: Diagnosis not present

## 2023-05-07 ENCOUNTER — Encounter: Payer: Self-pay | Admitting: Cardiology

## 2023-05-07 NOTE — Progress Notes (Addendum)
 Anesthesia Review:  PCP: Manus Hockey LOV 02/06/23  Cardiologist :  DR Ladona- LOV 04/11/23  Afib clinic ov on 05/05/23 [place] BARRE  Cardioversion 04/15/23  EP Study- 04/15/23  Chest x-ray : EKG : 05/05/23  Echo : 03/06/23  Stress test: Cardiac Cath :  2019  Activity level: can do a flight of stairs without difficutly  Sleep Study/ CPAP : has cpap  Fasting Blood Sugar :      / Checks Blood Sugar -- times a day:   Blood Thinner/ Instructions /Last Dose: ASA / Instructions/ Last Dose :    Eliquis - wife to call DR [facility] office in regards to preprocedure instructoins    DM- type2- does not check glucose at home  Hgba1c-  05/13/23- 7.2 - routed to DR [facility] on 05/13/23.  Metformin - none day of surgery Jardiance - hold for 72 hours prior to procedure last dose on 05/16/2023  Glucotrol - none am of surgery    PT is HOH.  Wife with pt at preop appt.    CBC done on 05/13/23 with hct of 52.5 and hgb of 17.0  routed to DR [facility] on 05/13/23.

## 2023-05-07 NOTE — Patient Instructions (Addendum)
 SURGICAL WAITING ROOM VISITATION  Patients having surgery or a procedure may have no more than 2 support people in the waiting area - these visitors may rotate.    Children under the age of 61 must have an adult with them who is not the patient.  Due to an increase in RSV and influenza rates and associated hospitalizations, children ages 91 and under may not visit patients in [facility] hospitals.  If the patient needs to stay at the hospital during part of their recovery, the visitor guidelines for inpatient rooms apply. Pre-op nurse will coordinate an appropriate time for 1 support person to accompany patient in pre-op.  This support person may not rotate.    Please refer to the [facility] website for the visitor guidelines for Inpatients (after your surgery is over and you are in a regular room).       Your procedure is scheduled on:  05/20/23    Report to [facility] Main Entrance    Report to admitting at  0715 AM   Call this number if you have problems the morning of surgery [phone]   Do not eat food :After Midnight.   After Midnight you may have the following liquids until _ 0645_____ AM  DAY OF SURGERY  Water  Non-Citrus Juices (without pulp, NO RED-Apple, White grape, White cranberry) Black Coffee (NO MILK/CREAM OR CREAMERS, sugar ok)  Clear Tea (NO MILK/CREAM OR CREAMERS, sugar ok) regular and decaf                             Plain Jell-O (NO RED)                                           Fruit ices (not with fruit pulp, NO RED)                                     Popsicles (NO RED)                                                               Sports drinks like Gatorade (NO RED)                    The day of surgery:  Drink ONE (1) Pre-Surgery Clear Ensure or G2 at 0645 AM  9 have completed by ) the morning of surgery. Drink in one sitting. Do not sip.  This drink was given to you during your hospital  pre-op appointment visit. Nothing else to  drink after completing the  Pre-Surgery Clear Ensure or G2.          If you have questions, please contact your surgeon's office.       Oral Hygiene is also important to reduce your risk of infection.                                    Remember - BRUSH YOUR TEETH THE MORNING OF SURGERY WITH  YOUR REGULAR TOOTHPASTE  DENTURES WILL BE REMOVED PRIOR TO SURGERY PLEASE DO NOT APPLY Poly grip OR ADHESIVES!!!   Do NOT smoke after Midnight   Stop all vitamins and herbal supplements 7 days before surgery.   Take these medicines the morning of surgery with A SIP OF WATER :  inhalers as usual and bring, allopurinol , amiodarone , protonix  if due to take, zyrted if needed, flonase  if needed, verapamil , metoprolol             Metformin - none day of surgery             Jardiance - hold for 72 hours prior to surgery.  Last dose on 05/16/2023              Glucotrol - none nite before or am of surgery   DO NOT TAKE ANY ORAL DIABETIC MEDICATIONS DAY OF YOUR SURGERY  Bring CPAP mask and tubing day of surgery.                              You may not have any metal on your body including hair pins, jewelry, and body piercing             Do not wear make-up, lotions, powders, perfumes/cologne, or deodorant  Do not wear nail polish including gel and S&S, artificial/acrylic nails, or any other type of covering on natural nails including finger and toenails. If you have artificial nails, gel coating, etc. that needs to be removed by a nail salon please have this removed prior to surgery or surgery may need to be canceled/ delayed if the surgeon/ anesthesia feels like they are unable to be safely monitored.   Do not shave  48 hours prior to surgery.               Men may shave face and neck.   Do not bring valuables to the hospital. [facility] IS NOT             RESPONSIBLE   FOR VALUABLES.   Contacts, glasses, dentures or bridgework may not be worn into surgery.   Bring small overnight bag day of  surgery.   DO NOT BRING YOUR HOME MEDICATIONS TO THE HOSPITAL. PHARMACY WILL DISPENSE MEDICATIONS LISTED ON YOUR MEDICATION LIST TO YOU DURING YOUR ADMISSION IN THE HOSPITAL!    Patients discharged on the day of surgery will not be allowed to drive home.  Someone NEEDS to stay with you for the first 24 hours after anesthesia.   Special Instructions: Bring a copy of your healthcare power of attorney and living will documents the day of surgery if you haven't scanned them before.              Please read over the following fact sheets you were given: IF YOU HAVE QUESTIONS ABOUT YOUR PRE-OP INSTRUCTIONS PLEASE CALL [phone]   If you received a COVID test during your pre-op visit  it is requested that you wear a mask when out in public, stay away from anyone that may not be feeling well and notify your surgeon if you develop symptoms. If you test positive for Covid or have been in contact with anyone that has tested positive in the last 10 days please notify you surgeon.      Pre-operative 5 CHG Bath Instructions   You can play a key role in reducing the risk of infection after surgery. Your skin needs to be as free of  germs as possible. You can reduce the number of germs on your skin by washing with CHG (chlorhexidine  gluconate) soap before surgery. CHG is an antiseptic soap that kills germs and continues to kill germs even after washing.   DO NOT use if you have an allergy to chlorhexidine /CHG or antibacterial soaps. If your skin becomes reddened or irritated, stop using the CHG and notify one of our RNs at [phone].   Please shower with the CHG soap starting 4 days before surgery using the following schedule:     Please keep in mind the following:  DO NOT shave, including legs and underarms, starting the day of your first shower.   You may shave your face at any point before/day of surgery.  Place clean sheets on your bed the day you start using CHG soap. Use a clean washcloth  (not used since being washed) for each shower. DO NOT sleep with pets once you start using the CHG.   CHG Shower Instructions:  If you choose to wash your hair and private area, wash first with your normal shampoo/soap.  After you use shampoo/soap, rinse your hair and body thoroughly to remove shampoo/soap residue.  Turn the water  OFF and apply about 3 tablespoons (45 ml) of CHG soap to a CLEAN washcloth.  Apply CHG soap ONLY FROM YOUR NECK DOWN TO YOUR TOES (washing for 3-5 minutes)  DO NOT use CHG soap on face, private areas, open wounds, or sores.  Pay special attention to the area where your surgery is being performed.  If you are having back surgery, having someone wash your back for you may be helpful. Wait 2 minutes after CHG soap is applied, then you may rinse off the CHG soap.  Pat dry with a clean towel  Put on clean clothes/pajamas   If you choose to wear lotion, please use ONLY the CHG-compatible lotions on the back of this paper.     Additional instructions for the day of surgery: DO NOT APPLY any lotions, deodorants, cologne, or perfumes.   Put on clean/comfortable clothes.  Brush your teeth.  Ask your nurse before applying any prescription medications to the skin.      CHG Compatible Lotions   Aveeno Moisturizing lotion  Cetaphil Moisturizing Cream  Cetaphil Moisturizing Lotion  Clairol Herbal Essence Moisturizing Lotion, Dry Skin  Clairol Herbal Essence Moisturizing Lotion, Extra Dry Skin  Clairol Herbal Essence Moisturizing Lotion, Normal Skin  Curel Age Defying Therapeutic Moisturizing Lotion with Alpha Hydroxy  Curel Extreme Care Body Lotion  Curel Soothing Hands Moisturizing Hand Lotion  Curel Therapeutic Moisturizing Cream, Fragrance-Free  Curel Therapeutic Moisturizing Lotion, Fragrance-Free  Curel Therapeutic Moisturizing Lotion, Original Formula  Eucerin Daily Replenishing Lotion  Eucerin Dry Skin Therapy Plus Alpha Hydroxy Crme  Eucerin Dry Skin  Therapy Plus Alpha Hydroxy Lotion  Eucerin Original Crme  Eucerin Original Lotion  Eucerin Plus Crme Eucerin Plus Lotion  Eucerin TriLipid Replenishing Lotion  Keri Anti-Bacterial Hand Lotion  Keri Deep Conditioning Original Lotion Dry Skin Formula Softly Scented  Keri Deep Conditioning Original Lotion, Fragrance Free Sensitive Skin Formula  Keri Lotion Fast Absorbing Fragrance Free Sensitive Skin Formula  Keri Lotion Fast Absorbing Softly Scented Dry Skin Formula  Keri Original Lotion  Keri Skin Renewal Lotion Keri Silky Smooth Lotion  Keri Silky Smooth Sensitive Skin Lotion  Nivea Body Creamy Conditioning Oil  Nivea Body Extra Enriched Teacher, Adult Education Moisturizing Lotion Nivea Crme  Nivea Skin Firming Lotion  NutraDerm 30 Skin Lotion  NutraDerm Skin Lotion  NutraDerm Therapeutic Skin Cream  NutraDerm Therapeutic Skin Lotion  ProShield Protective Hand Cream  Provon moisturizing lotion

## 2023-05-08 NOTE — Progress Notes (Signed)
 OFFICE VISIT  05/09/2023  CC:  Chief Complaint  Patient presents with   Diabetes    Pt is fasting.     Patient is a 78 y.o. male who presents for 3 mo f/u DM, HTN, HLD. A/P as of last visit: #1 new diagnosis A-fib.  Asymptomatic. CHA2DS2-VASc score is 4. Start Eliquis  5 mg twice daily.  Okay to continue verapamil  120 qd and Lopressor  50 mg twice daily at this time. Checking TSH and electrolytes today. His next follow-up appointment with Dr. Ladona is in December of this year.  We will call and see if we can get this moved up some.  He will follow-up with me in 2 weeks. Of note, most recent echocardiogram November 2023 : EF 55%, hypertrophic CM, grd II dd, normal global wall motion, and severe LA dilation.   #2 diabetes without complication. Control is adequate. POC hba1c today is 7.1%. Encouraged him to make some dietary adjustments. Continue Jardiance  25 mg a day, glipizide  XL 10 mg a day, and metformin  1000 mg twice a day.   #3 hypertension, good control on lisinopril  10 mg a day and Lopressor  50 mg twice daily.   #4 Erythrocytosis.  He has OSA and is not wearing his CPAP. --> encouraged him to use his CPAP every night. Monitor hemoglobin today and will recheck again at next follow-up.   INTERIM HX: He is s/p successful DCCV 04/15/23. He will be getting R TKA on 05/20/23.  He feels good other than chronic right knee pain.  Sleeping better.  He is wearing his CPAP now regularly. Denies palpitations, headache, dizziness, chest pain, or shortness of breath.  No hematochezia or melena.  No nosebleeds or excessive bruising.  Past Medical History:  Diagnosis Date   CAD (coronary artery disease)    see PSH section for details   Carotid bruit 05/28/2010   Tortuosity of carotids on doppler u/s, no stenosis.   Cataract    Chronic diastolic heart failure (HCC)    Grd II DD   Colon cancer screening    cologuard POSITIVE 09/2017-->colonoscopy 12/19/17; multiple adenomatous  polyps, recall 3 yrs.   COPD (chronic obstructive pulmonary disease) (HCC)    Diabetes mellitus    type 2- non insulin -requiring   DOE (dyspnea on exertion)    Cardiology w/u unrevealing except LVOT obstruction: suspect dyspnea due to HOCM, morbid obesity and OSA--stable as of 02/2017 cardiol f/u.   Fatty liver u/s and CT 2009   w/mildly elevated transaminases; u/s 12/2012 reconfirmed dx.  Hep B and C testing negative.   GERD (gastroesophageal reflux disease)    EDG 04-03-01, + distal esophageal stricture dilation   Gout    Heart murmur    Hiatal hernia    History of iron deficiency anemia 02/2019   Hb 12.9 02/2019->home hemoccults ordered but pt never returned these.  GI referral was done but pt did not return their call/unable to be contacted. Fall 2021 iron low but Hb normal->iron supp brought iron levels up (hemoccults neg x 3).   Hx of adenomatous colonic polyps 12/27/2017   Hyperlipidemia    Hypertension    severe LVH on echo   Hypertrophic obstructive cardiomyopathy (HOCM) (HCC) 2012   LVOT obstruction stable on echo 01/09/12 and again on f/u echo 09/29/14, 11/2016, 10/2017--with evidence of HOCM due to chordal systolic anterior motion of MV leaflet. Grd II DD.     Moderate persistent asthma 04/19/2014   Morbid obesity (HCC)    OSA on CPAP  Could not tolerate CPAP, not surgical candidate per Dr. Karis.  Restarted CPAP (auto titrate 5-15 with full facemask--Dr. Jude 2017.  Getting mask fitting/leak issues worked out as of 07/2016 pulm f/u.   Osteoarthritis, multiple sites    primarily knees and back.   PAF (paroxysmal atrial fibrillation) (HCC)    2024.  DCCV 03/2023   Stricture and stenosis of esophagus    Venous insufficiency of both lower extremities     Past Surgical History:  Procedure Laterality Date   CARDIOVASCULAR STRESS TEST  2006; 04/27/10; 10/2017   Normal stress nuclear study, normal EF.  2019: EF 40%, inferior ischemia--intermediate risk study-->Dr. Ladona to do cath.    CARDIOVERSION N/A 04/15/2023   Procedure: CARDIOVERSION;  Surgeon: Loni Soyla LABOR, MD;  Location: [facility] INVASIVE CV LAB;  Service: Cardiovascular;  Laterality: N/A;   COLONOSCOPY  x 3   Most recent-->12/19/17 (after + cologuard) multiple adenomatous polyps-->recall 3 yrs.   LEFT HEART CATH AND CORONARY ANGIOGRAPHY N/A 01/06/2018   One area of 80% ostial stenosis--small and hard to visualize. No intervention.  DAPT for >1 yr recommended.  Procedure: LEFT HEART CATH AND CORONARY ANGIOGRAPHY;  Surgeon: Ladona Heinz, MD;  Location: [facility] INVASIVE CV LAB;  Service: Cardiovascular;  Laterality: N/A;   NASAL SINUS SURGERY     Sleep study  07/2016   OSA; CPAP trial at 10 cm H20 recommended by pulm (Dr. everitt Dios--[place] pulm).   SPINE SURGERY  1970   ? Discectomy   TEE WITHOUT CARDIOVERSION  05/2010   Dr. Ladona; severe LVH, no valvular abnormalities   TRANSESOPHAGEAL ECHOCARDIOGRAM  06/06/10; 12/2011; 09/2014; 11/2016   LVOT obst with 135 mm Hg PG. HOCM. No AV obstruction to flow. No mitral regurg..  Repeat echo 01/09/12 no change.  2016: EF 60%, severe conc LV hypertrophy, normal global wall motion, LA mildly dilated, systolic anterior motion of mitral valve chord, mild MR, mild TR, mild pulm HTN--no signif change since 2013.  Repeat echo 11/2016 and 10/2017 no signif change compared to prior studies.   TRANSTHORACIC ECHOCARDIOGRAM  05/28/2010; 10/2017   2012; Normal LVEF, LVOT obst, lateral wall hypokin.  10/2017: EF 55%, hypertrophic CM, grd II dd, normal global wall motion. 02/2022 no change except now with severe LA dilation.  02/2023 No signif change    Outpatient Medications Prior to Visit  Medication Sig Dispense Refill   acetaminophen  (TYLENOL ) 650 MG CR tablet Take 1,300 mg by mouth every 8 (eight) hours as needed for pain.     albuterol  (PROAIR  HFA) 108 (90 Base) MCG/ACT inhaler INHALE 2 PUFFS INTO THE LUNGS EVERY 4 HOURS AS NEEDED FOR WHEEZING 8.5 g 0   allopurinol  (ZYLOPRIM ) 300 MG tablet TAKE 1  TABLET BY MOUTH TWICE  DAILY 180 tablet 0   amiodarone  (PACERONE ) 200 MG tablet Take 1 tablet (200 mg total) by mouth as directed. 1 tab twice daily for 10 day then 1 tab daily 45 tablet 2   apixaban  (ELIQUIS ) 5 MG TABS tablet Take 1 tablet (5 mg total) by mouth 2 (two) times daily. 60 tablet 6   cetirizine (ZYRTEC) 10 MG tablet Take 10 mg by mouth daily as needed for allergies.     Cholecalciferol (VITAMIN D) 2000 UNITS tablet Take 2,000 Units by mouth daily.     diclofenac  Sodium (VOLTAREN ) 1 % GEL Apply 2 g topically 4 (four) times daily. (Patient taking differently: Apply 2 g topically 4 (four) times daily as needed (joint pain).) 100 g 3  docusate sodium  (COLACE) 100 MG capsule Take 100 mg by mouth every other day.     empagliflozin  (JARDIANCE ) 25 MG TABS tablet Take 1 tablet (25 mg total) by mouth daily before breakfast. 90 tablet 3   fluticasone  (FLONASE ) 50 MCG/ACT nasal spray USE 2 SPRAYS IN BOTH NOSTRILS  DAILY AS NEEDED FOR ALLERGIES 64 g 1   Fluticasone  Furoate (ARNUITY ELLIPTA ) 200 MCG/ACT AEPB 1 puff qd (Patient taking differently: Inhale 1 puff into the lungs daily as needed (shortness of breath).) 90 each 3   glucose blood (ONE TOUCH ULTRA TEST) test strip Use as instructed to check blood sugar.  DX 250.00 100 each prn   metoprolol  tartrate (LOPRESSOR ) 50 MG tablet TAKE 1 TABLET BY MOUTH TWICE  DAILY 200 tablet 2   NON FORMULARY Pt uses a c-pap nightly     ondansetron  (ZOFRAN ) 8 MG tablet Take 1 tablet (8 mg total) by mouth every 8 (eight) hours as needed for nausea or vomiting. 20 tablet 0   ONE TOUCH LANCETS MISC Use as directed to check blood sugar.  DX 250.00 200 each prn   pantoprazole  (PROTONIX ) 40 MG tablet TAKE 1 TABLET BY MOUTH DAILY (Patient taking differently: Take 40 mg by mouth every other day.) 90 tablet 0   rosuvastatin  (CRESTOR ) 20 MG tablet TAKE 1 TABLET BY MOUTH AT  BEDTIME 90 tablet 0   traZODone  (DESYREL ) 50 MG tablet TAKE 1 TABLET(50 MG) BY MOUTH AT BEDTIME  (Patient taking differently: Take 50 mg by mouth at bedtime as needed for sleep.) 30 tablet 1   verapamil  (CALAN -SR) 120 MG CR tablet TAKE 1 TABLET BY MOUTH IN THE  MORNING 100 tablet 2   glipiZIDE  (GLUCOTROL  XL) 10 MG 24 hr tablet TAKE 1 TABLET BY MOUTH DAILY 90 tablet 0   metFORMIN  (GLUCOPHAGE ) 1000 MG tablet TAKE 1 TABLET BY MOUTH TWICE  DAILY WITH MEALS 180 tablet 0   No facility-administered medications prior to visit.    No Known Allergies  Review of Systems As per HPI  PE:    05/09/2023    8:25 AM 05/05/2023   11:09 AM 04/15/2023    1:40 PM  Vitals with BMI  Height  5' 8   Weight 260 lbs 13 oz 260 lbs 10 oz   BMI 39.66 39.63   Systolic 133 140 853  Diastolic 80 60 87  Pulse 80 50 64     Physical Exam  Gen: Alert, well appearing.  Patient is oriented to person, place, time, and situation. AFFECT: pleasant, lucid thought and speech. CV: RRR, no ectopy.  3/6 syst murmur unchanged. No diastolic murm.  LABS:  Last CBC Lab Results  Component Value Date   WBC 10.0 04/11/2023   HGB 18.7 (H) 04/11/2023   HCT 56.4 (H) 04/11/2023   MCV 93 04/11/2023   MCH 30.8 04/11/2023   RDW 14.9 04/11/2023   PLT 183 04/11/2023   Lab Results  Component Value Date   IRON 76 01/23/2023   TIBC 350 01/23/2023   FERRITIN 40 01/23/2023   Last metabolic panel Lab Results  Component Value Date   GLUCOSE 154 (H) 04/11/2023   NA 144 04/11/2023   K 4.8 04/11/2023   CL 105 04/11/2023   CO2 24 04/11/2023   BUN 25 04/11/2023   CREATININE 1.40 (H) 04/11/2023   EGFR 52 (L) 04/11/2023   CALCIUM  10.2 04/11/2023   PROT 6.6 10/23/2022   ALBUMIN 4.5 10/23/2022   LABGLOB 2.0 06/17/2020   AGRATIO 2.2  06/17/2020   BILITOT 0.8 10/23/2022   ALKPHOS 54 10/23/2022   AST 29 10/23/2022   ALT 23 10/23/2022   Last lipids Lab Results  Component Value Date   CHOL 100 10/23/2022   HDL 37.50 (L) 10/23/2022   LDLCALC 36 10/23/2022   TRIG 131.0 10/23/2022   CHOLHDL 3 10/23/2022   Last  hemoglobin A1c Lab Results  Component Value Date   HGBA1C 7.0 (A) 05/09/2023   HGBA1C 7.0 05/09/2023   HGBA1C 7.0 (A) 05/09/2023   HGBA1C 7.0 05/09/2023   IMPRESSION AND PLAN:  #1 diabetes without complication. Control is adequate. POC hba1c today is 7.1%. Continue Jardiance  25 mg a day, glipizide  XL 10 mg a day, and metformin  1000 mg twice a day.  #2 hypertension, well-controlled on lisinopril  10 mg a day and Lopressor  50 mg twice daily. Most recent electrolytes 1 month ago were normal.  His serum creatinine had risen a little bit to 1.4.  It was felt that this was likely due to concurrent/recent A-fib. He will be getting preop labs for his knee replacement surgery in 1 to 2 weeks.   He prefers to defer lab work today.  3.  Erythrocytosis. Most likely due to uncontrolled sleep apnea when he was noncompliant with his CPAP. He has been compliant with CPAP nightly the last few months.  #4 atrial fibrillation. He is status post successful DCCV. He will need to be on amiodarone  and Eliquis  indefinitely. He is followed closely by Dr. Ladona.  5 chronic right knee pain due to osteoarthritis. He will be getting R TKA on 05/20/23.  All medical problems are optimally managed and he is cleared from a medical standpoint.  An After Visit Summary was printed and given to the patient.  FOLLOW UP: Return in about 3 months (around 08/07/2023) for routine chronic illness f/u. Next cpe 06/2023  Signed:  Gerlene Hockey, MD           05/09/2023

## 2023-05-09 ENCOUNTER — Encounter: Payer: Self-pay | Admitting: Family Medicine

## 2023-05-09 ENCOUNTER — Ambulatory Visit (INDEPENDENT_AMBULATORY_CARE_PROVIDER_SITE_OTHER): Payer: Medicare Other | Admitting: Family Medicine

## 2023-05-09 VITALS — BP 133/80 | HR 80 | Wt 260.8 lb

## 2023-05-09 DIAGNOSIS — R7989 Other specified abnormal findings of blood chemistry: Secondary | ICD-10-CM

## 2023-05-09 DIAGNOSIS — E875 Hyperkalemia: Secondary | ICD-10-CM

## 2023-05-09 DIAGNOSIS — D751 Secondary polycythemia: Secondary | ICD-10-CM

## 2023-05-09 DIAGNOSIS — Z7984 Long term (current) use of oral hypoglycemic drugs: Secondary | ICD-10-CM | POA: Diagnosis not present

## 2023-05-09 DIAGNOSIS — E119 Type 2 diabetes mellitus without complications: Secondary | ICD-10-CM | POA: Diagnosis not present

## 2023-05-09 LAB — POCT GLYCOSYLATED HEMOGLOBIN (HGB A1C)
HbA1c POC (<> result, manual entry): 7 % (ref 4.0–5.6)
HbA1c, POC (controlled diabetic range): 7 % (ref 0.0–7.0)
HbA1c, POC (prediabetic range): 7 % — AB (ref 5.7–6.4)
Hemoglobin A1C: 7 % — AB (ref 4.0–5.6)

## 2023-05-09 MED ORDER — GLIPIZIDE ER 10 MG PO TB24: 10.0000 mg | ORAL_TABLET | Freq: Every day | ORAL | 3 refills | Status: AC

## 2023-05-09 MED ORDER — PANTOPRAZOLE SODIUM 40 MG PO TBEC: 40.0000 mg | DELAYED_RELEASE_TABLET | ORAL | 0 refills | Status: DC

## 2023-05-09 MED ORDER — ALLOPURINOL 300 MG PO TABS: 300.0000 mg | ORAL_TABLET | Freq: Two times a day (BID) | ORAL | 1 refills | Status: DC

## 2023-05-09 MED ORDER — METFORMIN HCL 1000 MG PO TABS: 1000.0000 mg | ORAL_TABLET | Freq: Two times a day (BID) | ORAL | 3 refills | Status: DC

## 2023-05-09 NOTE — Addendum Note (Signed)
 Addended by: Daron Offer A on: 05/09/2023 09:10 AM   Modules accepted: Orders

## 2023-05-12 DIAGNOSIS — M25661 Stiffness of right knee, not elsewhere classified: Secondary | ICD-10-CM | POA: Diagnosis not present

## 2023-05-12 DIAGNOSIS — R262 Difficulty in walking, not elsewhere classified: Secondary | ICD-10-CM | POA: Diagnosis not present

## 2023-05-12 DIAGNOSIS — M1731 Unilateral post-traumatic osteoarthritis, right knee: Secondary | ICD-10-CM | POA: Diagnosis not present

## 2023-05-12 NOTE — Telephone Encounter (Signed)
 Copied from CRM [id number]. Topic: Clinical - Prescription Issue >> May 09, 2023  2:07 PM Isabell A wrote: Reason for CRM: Spouse calling in regard to patients prescriptions being sent to the wrong pharmacy.   allopurinol  (ZYLOPRIM ) 300 MG tablet   glipiZIDE  (GLUCOTROL  XL) 10 MG 24 hr tablet   metFORMIN  (GLUCOPHAGE ) 1000 MG tablet   pantoprazole  (PROTONIX ) 40 MG tablet        Correct pharmacy: (Spouse states [facility]. Verified both, she is unsure & stated it usually gets delivered)   [facility] - [place], [place] - [address] [facility] [address] [place] [address] [place] [address] Phone: [phone] Fax: [phone]  [facility] - [place], [place] - [address] [place] [address] Phone: [phone] Fax: [phone]

## 2023-05-13 ENCOUNTER — Other Ambulatory Visit: Payer: Self-pay

## 2023-05-13 ENCOUNTER — Encounter (HOSPITAL_COMMUNITY): Payer: Self-pay

## 2023-05-13 ENCOUNTER — Encounter (HOSPITAL_COMMUNITY)
Admission: RE | Admit: 2023-05-13 | Discharge: 2023-05-13 | Disposition: A | Discharge status: Home / Self Care | Payer: Medicare Other | Source: Ambulatory Visit | Attending: Orthopaedic Surgery

## 2023-05-13 VITALS — BP 130/76 | HR 57 | Temp 97.8°F | Resp 16 | Ht 68.0 in | Wt 260.6 lb

## 2023-05-13 DIAGNOSIS — Z01812 Encounter for preprocedural laboratory examination: Secondary | ICD-10-CM | POA: Diagnosis not present

## 2023-05-13 DIAGNOSIS — Z01818 Encounter for other preprocedural examination: Secondary | ICD-10-CM

## 2023-05-13 HISTORY — DX: Pneumonia, unspecified organism: J18.9

## 2023-05-13 HISTORY — DX: Cardiac arrhythmia, unspecified: I49.9

## 2023-05-13 HISTORY — DX: Personal history of urinary calculi: Z87.442

## 2023-05-13 LAB — BASIC METABOLIC PANEL
Anion gap: 6 (ref 5–15)
BUN: 22 mg/dL (ref 8–23)
CO2: 25 mmol/L (ref 22–32)
Calcium: 9.4 mg/dL (ref 8.9–10.3)
Chloride: 106 mmol/L (ref 98–111)
Creatinine, Ser: 1.15 mg/dL (ref 0.61–1.24)
GFR, Estimated: >60 mL/min (ref >60)
Glucose, Bld: 105 mg/dL — ABNORMAL HIGH (ref 70–99)
Potassium: 4.4 mmol/L (ref 3.5–5.1)
Sodium: 137 mmol/L (ref 135–145)

## 2023-05-13 LAB — CBC
HCT: 52.5 % — ABNORMAL HIGH (ref 39.0–52.0)
Hemoglobin: 17 g/dL (ref 13.0–17.0)
MCH: 30.9 pg (ref 26.0–34.0)
MCHC: 32.4 g/dL (ref 30.0–36.0)
MCV: 95.5 fL (ref 80.0–100.0)
Platelets: 144 10*3/uL — ABNORMAL LOW (ref 150–400)
RBC: 5.5 MIL/uL (ref 4.22–5.81)
RDW: 14.9 % (ref 11.5–15.5)
WBC: 8.9 10*3/uL (ref 4.0–10.5)
nRBC: 0 % (ref 0.0–0.2)

## 2023-05-13 LAB — HEMOGLOBIN A1C
Hgb A1c MFr Bld: 7.2 % — ABNORMAL HIGH (ref 4.8–5.6)
Mean Plasma Glucose: 159.94 mg/dL

## 2023-05-13 LAB — GLUCOSE, CAPILLARY: Glucose-Capillary: 107 mg/dL — ABNORMAL HIGH (ref 70–99)

## 2023-05-13 LAB — SURGICAL PCR SCREEN
MRSA, PCR: NEGATIVE
Staphylococcus aureus: POSITIVE — AB

## 2023-05-14 ENCOUNTER — Ambulatory Visit: Payer: Medicare Other | Admitting: *Deleted

## 2023-05-14 DIAGNOSIS — Z0000 Encounter for general adult medical examination without abnormal findings: Secondary | ICD-10-CM | POA: Diagnosis not present

## 2023-05-14 NOTE — Progress Notes (Signed)
Subjective:   Jack [name] is a 78 y.o. male who presents for Medicare Annual/Subsequent preventive examination.  Visit Complete: Virtual I connected with  Jack [name] on 05/14/23 by a audio enabled telemedicine application and verified that I am speaking with the correct person using two identifiers.  Patient Location: Home  Provider Location: Home Office  I discussed the limitations of evaluation and management by telemedicine. The patient expressed understanding and agreed to proceed.  Vital Signs: Because this visit was a virtual/telehealth visit, some criteria may be missing or patient reported. Any vitals not documented were not able to be obtained and vitals that have been documented are patient reported.  Unable to connect to video  Cardiac Risk Factors include: advanced age (>12men, >5 women);diabetes mellitus;male gender;obesity (BMI >30kg/m2)     Objective:    Today's Vitals   05/14/23 1525  PainSc: 7    There is no height or weight on file to calculate BMI.     05/14/2023    3:40 PM 05/13/2023    8:47 AM 04/15/2023   12:21 PM 05/08/2022    3:15 PM 05/02/2021    1:07 PM 03/15/2020    1:17 PM 03/06/2018    9:00 AM  Advanced Directives  Does Patient Have a Medical Advance Directive? Yes Yes No Yes Yes Yes Yes  Type of Sales promotion account executive of [place];Living will  Healthcare Power of [place];Living will Healthcare Power of [employer] of [place];Living will Living will;Healthcare Power of Attorney  Copy of Healthcare Power of Attorney in Chart? Yes - validated most recent copy scanned in chart (See row information)    No - copy requested No - copy requested No - copy requested  Would patient like information on creating a medical advance directive?   No - Patient declined        Current Medications (verified) Outpatient Encounter Medications as of 05/14/2023  Medication Sig   acetaminophen (TYLENOL) 650  MG CR tablet Take 1,300 mg by mouth every 8 (eight) hours as needed for pain.   albuterol (PROAIR HFA) 108 (90 Base) MCG/ACT inhaler INHALE 2 PUFFS INTO THE LUNGS EVERY 4 HOURS AS NEEDED FOR WHEEZING   allopurinol (ZYLOPRIM) 300 MG tablet Take 1 tablet (300 mg total) by mouth 2 (two) times daily.   amiodarone (PACERONE) 200 MG tablet Take 1 tablet (200 mg total) by mouth as directed. 1 tab twice daily for 10 day then 1 tab daily   apixaban (ELIQUIS) 5 MG TABS tablet Take 1 tablet (5 mg total) by mouth 2 (two) times daily.   cetirizine (ZYRTEC) 10 MG tablet Take 10 mg by mouth daily as needed for allergies.   Cholecalciferol (VITAMIN D) 2000 UNITS tablet Take 2,000 Units by mouth daily.   diclofenac Sodium (VOLTAREN) 1 % GEL Apply 2 g topically 4 (four) times daily. (Patient taking differently: Apply 2 g topically 4 (four) times daily as needed (joint pain).)   docusate sodium (COLACE) 100 MG capsule Take 100 mg by mouth every other day.   empagliflozin (JARDIANCE) 25 MG TABS tablet Take 1 tablet (25 mg total) by mouth daily before breakfast.   fluticasone (FLONASE) 50 MCG/ACT nasal spray USE 2 SPRAYS IN BOTH NOSTRILS  DAILY AS NEEDED FOR ALLERGIES   Fluticasone Furoate (ARNUITY ELLIPTA) 200 MCG/ACT AEPB 1 puff qd (Patient taking differently: Inhale 1 puff into the lungs daily as needed (shortness of breath).)   glipiZIDE (GLUCOTROL XL) 10 MG 24  hr tablet Take 1 tablet (10 mg total) by mouth daily.   glucose blood (ONE TOUCH ULTRA TEST) test strip Use as instructed to check blood sugar.  DX 250.00   metFORMIN (GLUCOPHAGE) 1000 MG tablet Take 1 tablet (1,000 mg total) by mouth 2 (two) times daily with a meal.   metoprolol tartrate (LOPRESSOR) 50 MG tablet TAKE 1 TABLET BY MOUTH TWICE  DAILY   NON FORMULARY Pt uses a c-pap nightly   ondansetron (ZOFRAN) 8 MG tablet Take 1 tablet (8 mg total) by mouth every 8 (eight) hours as needed for nausea or vomiting.   ONE TOUCH LANCETS MISC Use as directed to  check blood sugar.  DX 250.00   pantoprazole (PROTONIX) 40 MG tablet Take 1 tablet (40 mg total) by mouth every other day.   rosuvastatin (CRESTOR) 20 MG tablet TAKE 1 TABLET BY MOUTH AT  BEDTIME   traZODone (DESYREL) 50 MG tablet TAKE 1 TABLET(50 MG) BY MOUTH AT BEDTIME (Patient taking differently: Take 50 mg by mouth at bedtime as needed for sleep.)   verapamil (CALAN-SR) 120 MG CR tablet TAKE 1 TABLET BY MOUTH IN THE  MORNING   No facility-administered encounter medications on file as of 05/14/2023.    Allergies (verified) Patient has no known allergies.   History: Past Medical History:  Diagnosis Date   CAD (coronary artery disease)    see PSH section for details   Carotid bruit 05/28/2010   Tortuosity of carotids on doppler u/s, no stenosis.   Cataract    Chronic diastolic heart failure (HCC)    Grd II DD   Colon cancer screening    cologuard POSITIVE 09/2017-->colonoscopy 12/19/17; multiple adenomatous polyps, recall 3 yrs.   Diabetes mellitus    type 2- non insulin-requiring   DOE (dyspnea on exertion)    Cardiology w/u unrevealing except LVOT obstruction: suspect dyspnea due to HOCM, morbid obesity and OSA--stable as of 02/2017 cardiol f/u.   Dysrhythmia    Fatty liver u/s and CT 2009   w/mildly elevated transaminases; u/s 12/2012 reconfirmed dx.  Hep B and C testing negative.   Gout    Heart murmur    Hiatal hernia    History of iron deficiency anemia 02/2019   Hb 12.9 02/2019->home hemoccults ordered but pt never returned these.  GI referral was done but pt did not return their call/unable to be contacted. Fall 2021 iron low but Hb normal->iron supp brought iron levels up (hemoccults neg x 3).   History of kidney stones    Hx of adenomatous colonic polyps 12/27/2017   Hyperlipidemia    Hypertension    severe LVH on echo   Hypertrophic obstructive cardiomyopathy (HOCM) (HCC) 2012   LVOT obstruction stable on echo 01/09/12 and again on f/u echo 09/29/14, 11/2016,  10/2017--with evidence of HOCM due to chordal systolic anterior motion of MV leaflet. Grd II DD.     Moderate persistent asthma 04/19/2014   Morbid obesity (HCC)    OSA on CPAP    Could not tolerate CPAP, not surgical candidate per Dr. Suszanne Conners.  Restarted CPAP (auto titrate 5-15 with full facemask--Dr. Vassie Loll 2017.  Getting mask fitting/leak issues worked out as of 07/2016 pulm f/u.   Osteoarthritis, multiple sites    primarily knees and back.   PAF (paroxysmal atrial fibrillation) (HCC)    2024.  DCCV 03/2023   Pneumonia    Stricture and stenosis of esophagus    Venous insufficiency of both lower extremities    Past  Surgical History:  Procedure Laterality Date   CARDIOVASCULAR STRESS TEST  2006; 04/27/10; 10/2017   Normal stress nuclear study, normal EF.  2019: EF 40%, inferior ischemia--intermediate risk study-->Dr. Jacinto Halim to do cath.   CARDIOVERSION N/A 04/15/2023   Procedure: CARDIOVERSION;  Surgeon: Parke Poisson, MD;  Location: [facility] INVASIVE CV LAB;  Service: Cardiovascular;  Laterality: N/A;   CARDIOVERSION     COLONOSCOPY  x 3   Most recent-->12/19/17 (after + cologuard) multiple adenomatous polyps-->recall 3 yrs.   LEFT HEART CATH AND CORONARY ANGIOGRAPHY N/A 01/06/2018   One area of 80% ostial stenosis--small and hard to visualize. No intervention.  DAPT for >1 yr recommended.  Procedure: LEFT HEART CATH AND CORONARY ANGIOGRAPHY;  Surgeon: Yates Decamp, MD;  Location: [facility] INVASIVE CV LAB;  Service: Cardiovascular;  Laterality: N/A;   left rotator cuff surgery      NASAL SINUS SURGERY     Sleep study  07/2016   OSA; CPAP trial at 10 cm H20 recommended by pulm (Dr. Tommi Rumps Dios-- pulm).   SPINE SURGERY  1970   ? Discectomy   TEE WITHOUT CARDIOVERSION  05/2010   Dr. Jacinto Halim; severe LVH, no valvular abnormalities   TRANSESOPHAGEAL ECHOCARDIOGRAM  06/06/10; 12/2011; 09/2014; 11/2016   LVOT obst with 135 mm Hg PG. HOCM. No AV obstruction to flow. No mitral regurg..  Repeat echo 01/09/12 no  change.  2016: EF 60%, severe conc LV hypertrophy, normal global wall motion, LA mildly dilated, systolic anterior motion of mitral valve chord, mild MR, mild TR, mild pulm HTN--no signif change since 2013.  Repeat echo 11/2016 and 10/2017 no signif change compared to prior studies.   TRANSTHORACIC ECHOCARDIOGRAM  05/28/2010; 10/2017   2012; Normal LVEF, LVOT obst, lateral wall hypokin.  10/2017: EF 55%, hypertrophic CM, grd II dd, normal global wall motion. 02/2022 no change except now with severe LA dilation.  02/2023 No signif change   Family History  Problem Relation Age of Onset   Diabetes Mother    Prostate cancer Father        Prostate   Breast cancer Sister    Diabetes Maternal Aunt    Breast cancer Maternal Aunt    Hyperlipidemia Sister    Hypertension Neg Hx    Coronary artery disease Neg Hx    Colon cancer Neg Hx    Stomach cancer Neg Hx    Rectal cancer Neg Hx    Esophageal cancer Neg Hx    Social History   Socioeconomic History   Marital status: Married    Spouse name: Randa Evens   Number of children: 1   Years of education: Not on file   Highest education level: Not on file  Occupational History   Occupation: retired    Associate Professor: RETIRED  Tobacco Use   Smoking status: Former    Current packs/day: 0.00    Average packs/day: 0.5 packs/day for 40.0 years (20.0 ttl pk-yrs)    Types: Cigarettes, Cigars    Start date: 04/29/1958    Quit date: 04/29/1998    Years since quitting: 25.0   Smokeless tobacco: Never   Tobacco comments:    4 cigars  Vaping Use   Vaping status: Never Used  Substance and Sexual Activity   Alcohol use: Yes    Comment: rare   Drug use: No   Sexual activity: Not on file  Other Topics Concern   Not on file  Social History Narrative   Married, 1 son, 1 granddaughter.   Retired  from [employer] 2000.   Hobbies: trading farm equipment.  Used to be a Editor, commissioning.   Tobacco x many years, quit @ 2005   No ETOH or drugs.    No regular exercise.Smoking Status:  quit            Social Drivers of Health   Financial Resource Strain: Low Risk  (05/14/2023)   Overall Financial Resource Strain (CARDIA)    Difficulty of Paying Living Expenses: Not hard at all  Food Insecurity: No Food Insecurity (05/14/2023)   Hunger Vital Sign    Worried About Running Out of Food in the Last Year: Never true    Ran Out of Food in the Last Year: Never true  Transportation Needs: No Transportation Needs (05/14/2023)   PRAPARE - Administrator, Civil Service (Medical): No    Lack of Transportation (Non-Medical): No  Physical Activity: Insufficiently Active (05/14/2023)   Exercise Vital Sign    Days of Exercise per Week: 3 days    Minutes of Exercise per Session: 30 min  Stress: No Stress Concern Present (05/14/2023)   [employer] of Occupational Health - Occupational Stress Questionnaire    Feeling of Stress : Not at all  Social Connections: Socially Integrated (05/14/2023)   Social Connection and Isolation Panel [NHANES]    Frequency of Communication with Friends and Family: Three times a week    Frequency of Social Gatherings with Friends and Family: Three times a week    Attends Religious Services: More than 4 times per year    Active Member of Clubs or Organizations: Yes    Attends Engineer, structural: More than 4 times per year    Marital Status: Married    Tobacco Counseling Counseling given: Not Answered Tobacco comments: 4 cigars   Clinical Intake:  Pre-visit preparation completed: Yes  Pain : 0-10 Pain Score: 7  Pain Type: Chronic pain Pain Location: Knee Pain Orientation: Right Pain Descriptors / Indicators: Burning, Dull, Aching Pain Frequency: Constant     Diabetes: Yes CBG done?: No Did pt. bring in CBG monitor from home?: No  How often do you need to have someone help you when you read instructions, pamphlets, or other written materials from your doctor or pharmacy?: 1 -  Never  Interpreter Needed?: No  Information entered by :: Remi Haggard LPN   Activities of Daily Living    05/14/2023    3:29 PM 05/13/2023    8:49 AM  In your present state of health, do you have any difficulty performing the following activities:  Hearing? 1   Vision? 0   Difficulty concentrating or making decisions? 0   Walking or climbing stairs? 0   Dressing or bathing? 0   Doing errands, shopping? 0 0  Preparing Food and eating ? N   Using the Toilet? N   In the past six months, have you accidently leaked urine? N   Do you have problems with loss of bowel control? N   Managing your Medications? N   Managing your Finances? N   Housekeeping or managing your Housekeeping? N     Patient Care Team: Jeoffrey Massed, MD as PCP - General Yates Decamp, MD as PCP - Cardiology (Cardiology) Yates Decamp, MD as Consulting Physician (Cardiology) Coralyn Helling, MD (Inactive) as Consulting Physician (Pulmonary Disease) Serena Colonel, MD as Consulting Physician (Otolaryngology) Jethro Bolus, MD as Consulting Physician (Ophthalmology) Merwyn Katos, DPM as Consulting Physician (Podiatry) Oretha Milch,  MD as Consulting Physician (Pulmonary Disease) de Dios, Hilton Cork, MD (Inactive) as Consulting Physician (Pulmonary Disease) Iva Boop, MD as Consulting Physician (Gastroenterology) Emelda Fear, MD as Referring Physician (Dermatology) Janalyn Harder, MD (Inactive) as Consulting Physician (Dermatology) Marcene Corning, MD as Consulting Physician (Orthopedic Surgery)  Indicate any recent Medical Services you may have received from other than [facility] providers in the past year (date may be approximate).     Assessment:   This is a routine wellness examination for Ethann.  Hearing/Vision screen Hearing Screening - Comments:: Yes trouble hearing Vision Screening - Comments:: Up to date Looking for a new    Goals Addressed             This Visit's Progress    Patient  Stated       Maintain current lifestyle       Depression Screen    05/14/2023    3:27 PM 01/23/2023    8:38 AM 07/23/2022    8:43 AM 05/08/2022    3:14 PM 04/15/2022    8:16 AM 05/02/2021    1:05 PM 02/16/2021    9:00 AM  PHQ 2/9 Scores  PHQ - 2 Score 0 0 0 0 0 0 0  PHQ- 9 Score 2 2 5         Fall Risk    05/14/2023    3:42 PM 01/23/2023    8:38 AM 07/23/2022    8:42 AM 05/08/2022    3:15 PM 05/02/2021    1:08 PM  Fall Risk   Falls in the past year? 0 0 1 1 0  Number falls in past yr: 0 0 1 0 0  Injury with Fall? 0 0 0 0 0  Risk for fall due to :  No Fall Risks No Fall Risks    Follow up Falls evaluation completed;Education provided;Falls prevention discussed Falls evaluation completed Falls evaluation completed Falls evaluation completed Falls prevention discussed    MEDICARE RISK AT HOME: Medicare Risk at Home Any stairs in or around the home?: No If so, are there any without handrails?: No Home free of loose throw rugs in walkways, pet beds, electrical cords, etc?: Yes Adequate lighting in your home to reduce risk of falls?: Yes Life alert?: No Use of a cane, walker or w/c?: No Grab bars in the bathroom?: Yes Shower chair or bench in shower?: Yes Elevated toilet seat or a handicapped toilet?: No  TIMED UP AND GO:  Was the test performed?  No    Cognitive Function:    03/06/2018    9:07 AM  MMSE - Mini Mental State Exam  Orientation to time 5  Orientation to Place 5  Registration 3  Attention/ Calculation 5  Recall 2  Language- name 2 objects 2  Language- repeat 1  Language- follow 3 step command 3  Language- read & follow direction 1  Write a sentence 1  Copy design 1  Total score 29        05/14/2023    3:24 PM 05/08/2022    3:17 PM 05/02/2021    1:09 PM  6CIT Screen  What Year? 0 points 0 points 0 points  What month? 0 points 0 points 0 points  What time? 0 points 0 points 0 points  Count back from 20 0 points 0 points 0 points  Months in reverse 2  points 0 points 4 points  Repeat phrase 2 points 0 points 2 points  Total Score 4 points 0 points  6 points    Immunizations Immunization History  Administered Date(s) Administered   Fluad Quad(high Dose 65+) 01/25/2020, 01/26/2021, 02/08/2022   Hepatitis B 06/08/2012, 07/10/2012   Hepatitis B, ADULT 11/30/2012   Influenza Split 02/26/2011, 01/16/2012   Influenza Whole 12/28/2009   Influenza, High Dose Seasonal PF 01/24/2016, 12/29/2016, 12/19/2018   Influenza,inj,Quad PF,6+ Mos 02/05/2013, 02/02/2014, 01/24/2016   Influenza-Unspecified 02/04/2015, 01/16/2018, 01/04/2020, 01/27/2022, 01/20/2023   Moderna SARS-COV2 Booster Vaccination 01/18/2021   Moderna Sars-Covid-2 Vaccination 05/20/2019, 06/25/2019, 02/25/2020   Pneumococcal Conjugate-13 08/03/2013   Pneumococcal Polysaccharide-23 06/06/2011, 01/21/2012, 02/28/2017   Td 04/30/2007   Tdap 03/12/2018   Zoster Recombinant(Shingrix) 03/12/2018, 05/12/2018   Zoster, Live 12/03/2011    TDAP status: Up to date  Flu Vaccine status: Up to date  Pneumococcal vaccine status: Up to date  Covid-19 vaccine status: Information provided on how to obtain vaccines.   Qualifies for Shingles Vaccine? No   Zostavax completed Yes   Shingrix Completed?: Yes  Screening Tests Health Maintenance  Topic Date Due   Colonoscopy  12/19/2020   COVID-19 Vaccine (4 - 2024-25 season) 06/09/2024 (Originally 12/29/2022)   Diabetic kidney evaluation - Urine ACR  07/23/2023   FOOT EXAM  07/23/2023   OPHTHALMOLOGY EXAM  09/12/2023   HEMOGLOBIN A1C  11/10/2023   Diabetic kidney evaluation - eGFR measurement  05/12/2024   Medicare Annual Wellness (AWV)  05/13/2024   DTaP/Tdap/Td (3 - Td or Tdap) 03/12/2028   Pneumonia Vaccine 28+ Years old  Completed   INFLUENZA VACCINE  Completed   Hepatitis C Screening  Completed   Zoster Vaccines- Shingrix  Completed   HPV VACCINES  Aged Out    Health Maintenance  Health Maintenance Due  Topic Date Due    Colonoscopy  12/19/2020    Colorectal cancer screening: No longer required.   Lung Cancer Screening: (Low Dose CT Chest recommended if Age 40-80 years, 20 pack-year currently smoking OR have quit w/in 15years.) does not qualify.   Lung Cancer Screening Referral:   Additional Screening:  Hepatitis C Screening: does not qualify; Completed 2014  Vision Screening: Recommended annual ophthalmology exams for early detection of glaucoma and other disorders of the eye. Is the patient up to date with their annual eye exam?  Yes  Who is the provider or what is the name of the office in which the patient attends annual eye exams? Looking for a new one If pt is not established with a provider, would they like to be referred to a provider to establish care? No .   Dental Screening: Recommended annual dental exams for proper oral hygiene  Nutrition Risk Assessment:  Has the patient had any N/V/D within the last 2 months?  No  Does the patient have any non-healing wounds?  No  Has the patient had any unintentional weight loss or weight gain?  No   Diabetes:  Is the patient diabetic?  Yes  If diabetic, was a CBG obtained today?  No  Did the patient bring in their glucometer from home?  No  How often do you monitor your CBG's? Does not check.   Financial Strains and Diabetes Management:  Are you having any financial strains with the device, your supplies or your medication? No .  Does the patient want to be seen by Chronic Care Management for management of their diabetes?  No  Would the patient like to be referred to a Nutritionist or for Diabetic Management?  No   Diabetic Exams:  Diabetic Eye Exam: .  Pt has been advised about the importance in completing this exam.  Diabetic Foot Exam: . Pt has been advised about the importance in completing this exam. .    Community Resource Referral / Chronic Care Management: CRR required this visit?  No   CCM required this visit?  No     Plan:      I have personally reviewed and noted the following in the patient's chart:   Medical and social history Use of alcohol, tobacco or illicit drugs  Current medications and supplements including opioid prescriptions. Patient is not currently taking opioid prescriptions. Functional ability and status Nutritional status Physical activity Advanced directives List of other physicians Hospitalizations, surgeries, and ER visits in previous 12 months Vitals Screenings to include cognitive, depression, and falls Referrals and appointments  In addition, I have reviewed and discussed with patient certain preventive protocols, quality metrics, and best practice recommendations. A written personalized care plan for preventive services as well as general preventive health recommendations were provided to patient.     Remi Haggard, LPN   [id number]/56/[id number]   After Visit Summary: (MyChart) Due to this being a telephonic visit, the after visit summary with patients personalized plan was offered to patient via MyChart   Nurse Notes:

## 2023-05-14 NOTE — Patient Instructions (Signed)
 Jack [name] , Thank you for taking time to come for your Medicare Wellness Visit. I appreciate your ongoing commitment to your health goals. Please review the following plan we discussed and let me know if I can assist you in the future.   Screening recommendations/referrals: Colonoscopy: no longer required Recommended yearly ophthalmology/optometry visit for glaucoma screening and checkup Recommended yearly dental visit for hygiene and checkup  Vaccinations: Influenza vaccine: up to date Pneumococcal vaccine: up to date Tdap vaccine: up to date Shingles vaccine: up to date       Preventive Care 77 Years and Older, Male Preventive care refers to lifestyle choices and visits with your health care provider that can promote health and wellness. What does preventive care include? A yearly physical exam. This is also called an annual well check. Dental exams once or twice a year. Routine eye exams. Ask your health care provider how often you should have your eyes checked. Personal lifestyle choices, including: Daily care of your teeth and gums. Regular physical activity. Eating a healthy diet. Avoiding tobacco and drug use. Limiting alcohol use. Practicing safe sex. Taking low doses of aspirin  every day. Taking vitamin and mineral supplements as recommended by your health care provider. What happens during an annual well check? The services and screenings done by your health care provider during your annual well check will depend on your age, overall health, lifestyle risk factors, and family history of disease. Counseling  Your health care provider may ask you questions about your: Alcohol use. Tobacco use. Drug use. Emotional well-being. Home and relationship well-being. Sexual activity. Eating habits. History of falls. Memory and ability to understand (cognition). Work and work Astronomer. Screening  You may have the following tests or measurements: Height, weight, and  BMI. Blood pressure. Lipid and cholesterol levels. These may be checked every 5 years, or more frequently if you are over 77 years old. Skin check. Lung cancer screening. You may have this screening every year starting at age 65 if you have a 30-pack-year history of smoking and currently smoke or have quit within the past 15 years. Fecal occult blood test (FOBT) of the stool. You may have this test every year starting at age 77. Flexible sigmoidoscopy or colonoscopy. You may have a sigmoidoscopy every 5 years or a colonoscopy every 10 years starting at age 77. Prostate cancer screening. Recommendations will vary depending on your family history and other risks. Hepatitis C blood test. Hepatitis B blood test. Sexually transmitted disease (STD) testing. Diabetes screening. This is done by checking your blood sugar (glucose) after you have not eaten for a while (fasting). You may have this done every 1-3 years. Abdominal aortic aneurysm (AAA) screening. You may need this if you are a current or former smoker. Osteoporosis. You may be screened starting at age 62 if you are at high risk. Talk with your health care provider about your test results, treatment options, and if necessary, the need for more tests. Vaccines  Your health care provider may recommend certain vaccines, such as: Influenza vaccine. This is recommended every year. Tetanus, diphtheria, and acellular pertussis (Tdap, Td) vaccine. You may need a Td booster every 10 years. Zoster vaccine. You may need this after age 78. Pneumococcal 13-valent conjugate (PCV13) vaccine. One dose is recommended after age 77. Pneumococcal polysaccharide (PPSV23) vaccine. One dose is recommended after age 77. Talk to your health care provider about which screenings and vaccines you need and how often you need them. This information is not  intended to replace advice given to you by your health care provider. Make sure you discuss any questions you have  with your health care provider. Document Released: 05/12/2015 Document Revised: 01/03/2016 Document Reviewed: 02/14/2015 Elsevier Interactive Patient Education  2017 [employer].  Fall Prevention in the Home Falls can cause injuries. They can happen to people of all ages. There are many things you can do to make your home safe and to help prevent falls. What can I do on the outside of my home? Regularly fix the edges of walkways and driveways and fix any cracks. Remove anything that might make you trip as you walk through a door, such as a raised step or threshold. Trim any bushes or trees on the path to your home. Use bright outdoor lighting. Clear any walking paths of anything that might make someone trip, such as rocks or tools. Regularly check to see if handrails are loose or broken. Make sure that both sides of any steps have handrails. Any raised decks and porches should have guardrails on the edges. Have any leaves, snow, or ice cleared regularly. Use sand or salt on walking paths during winter. Clean up any spills in your garage right away. This includes oil or grease spills. What can I do in the bathroom? Use night lights. Install grab bars by the toilet and in the tub and shower. Do not use towel bars as grab bars. Use non-skid mats or decals in the tub or shower. If you need to sit down in the shower, use a plastic, non-slip stool. Keep the floor dry. Clean up any water  that spills on the floor as soon as it happens. Remove soap buildup in the tub or shower regularly. Attach bath mats securely with double-sided non-slip rug tape. Do not have throw rugs and other things on the floor that can make you trip. What can I do in the bedroom? Use night lights. Make sure that you have a light by your bed that is easy to reach. Do not use any sheets or blankets that are too big for your bed. They should not hang down onto the floor. Have a firm chair that has side arms. You can use  this for support while you get dressed. Do not have throw rugs and other things on the floor that can make you trip. What can I do in the kitchen? Clean up any spills right away. Avoid walking on wet floors. Keep items that you use a lot in easy-to-reach places. If you need to reach something above you, use a strong step stool that has a grab bar. Keep electrical cords out of the way. Do not use floor polish or wax that makes floors slippery. If you must use wax, use non-skid floor wax. Do not have throw rugs and other things on the floor that can make you trip. What can I do with my stairs? Do not leave any items on the stairs. Make sure that there are handrails on both sides of the stairs and use them. Fix handrails that are broken or loose. Make sure that handrails are as long as the stairways. Check any carpeting to make sure that it is firmly attached to the stairs. Fix any carpet that is loose or worn. Avoid having throw rugs at the top or bottom of the stairs. If you do have throw rugs, attach them to the floor with carpet tape. Make sure that you have a light switch at the top of the stairs  and the bottom of the stairs. If you do not have them, ask someone to add them for you. What else can I do to help prevent falls? Wear shoes that: Do not have high heels. Have rubber bottoms. Are comfortable and fit you well. Are closed at the toe. Do not wear sandals. If you use a stepladder: Make sure that it is fully opened. Do not climb a closed stepladder. Make sure that both sides of the stepladder are locked into place. Ask someone to hold it for you, if possible. Clearly mark and make sure that you can see: Any grab bars or handrails. First and last steps. Where the edge of each step is. Use tools that help you move around (mobility aids) if they are needed. These include: Canes. Walkers. Scooters. Crutches. Turn on the lights when you go into a dark area. Replace any light bulbs  as soon as they burn out. Set up your furniture so you have a clear path. Avoid moving your furniture around. If any of your floors are uneven, fix them. If there are any pets around you, be aware of where they are. Review your medicines with your doctor. Some medicines can make you feel dizzy. This can increase your chance of falling. Ask your doctor what other things that you can do to help prevent falls. This information is not intended to replace advice given to you by your health care provider. Make sure you discuss any questions you have with your health care provider. Document Released: 02/09/2009 Document Revised: 09/21/2015 Document Reviewed: 05/20/2014 Elsevier Interactive Patient Education  2017 [employer].

## 2023-05-15 ENCOUNTER — Encounter (HOSPITAL_COMMUNITY): Payer: Self-pay

## 2023-05-15 NOTE — Progress Notes (Signed)
 Case: [id number] Date/Time: 05/20/23 0930   Procedure: RIGHT TOTAL KNEE ARTHROPLASTY (Right: Knee)   Anesthesia type: Spinal   Pre-op diagnosis: RIGHT KNEE DEGENERATIVE JOINT DISEASE   Location: [facility] ROOM 06 / [facility]   Surgeons: Sheril Coy, MD       DISCUSSION: Jack [name] is a 78 year old male who presents to PAT prior to surgery above.  Past medical history significant for former smoking, hypertension, HOCM, chronic diastolic heart failure, PAF, OSA (cannot tolerate CPAP), asthma, hiatal hernia, esophageal stricture, diabetes, hepatic steatosis, arthritis, obesity (BMI 39).  Patient follows with Cardiology for history of HOCM, CAD, and chronic diastolic heart failure.  Last echo on 03/06/23 was stable from prior. He has mild non-obstructive CAD by cath in 2019. Also he was found to be in A-fib when he saw his PCP in October 2024.  He was started on Eliquis .  He underwent cardioversion on 04/15/2023 which was successful.  He followed up with cardiology in clinic and was last seen on 05/05/2023.  He was cleared for upcoming surgery:  He is scheduled for right knee arthroplasty on 05/20/23. He is okay to temporarily hold anticoagulation 4 weeks from date of cardioversion.  Patient follows with his PCP and was last seen on 05/09/2023.  Blood pressure is controlled.  Patient has diabetes and last A1c was 7.1.  He has moderate to severe OSA and has difficulty tolerating his CPAP.   VS: BP 130/76   Pulse (!) 57   Temp 36.6 C (Oral)   Resp 16   Ht 5' 8 (1.727 m)   Wt 118.2 kg   SpO2 100%   BMI 39.62 kg/m   PROVIDERS: McGowen, Aleene DEL, MD Cardiologist :  DR Ladona  LABS: Labs reviewed: Acceptable for surgery. (all labs ordered are listed, but only abnormal results are displayed)  Labs Reviewed  SURGICAL PCR SCREEN - Abnormal; Notable for the following components:      Result Value   Staphylococcus aureus POSITIVE (*)    All other components within normal limits  HEMOGLOBIN A1C -  Abnormal; Notable for the following components:   Hgb A1c MFr Bld 7.2 (*)    All other components within normal limits  BASIC METABOLIC PANEL - Abnormal; Notable for the following components:   Glucose, Bld 105 (*)    All other components within normal limits  CBC - Abnormal; Notable for the following components:   HCT 52.5 (*)    Platelets 144 (*)    All other components within normal limits  GLUCOSE, CAPILLARY - Abnormal; Notable for the following components:   Glucose-Capillary 107 (*)    All other components within normal limits     IMAGES:   EKG 05/05/23  Sinus bradycardia with sinus arrhythmia with 1st degree A-V block, rate 50 Right bundle branch block Left anterior fascicular block Bifascicular block Left ventricular hypertrophy with repolarization abnormality ( R in aVL ) Appears similar to prior EKG on 04/15/23  CV:  ECHOCARDIOGRAM COMPLETE 03/06/2023  1. 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 The IVSd is 2.3 cm. There appears to be mid-cavitary obliteration during systole that is partially responsible for this gradient. Left ventricular ejection fraction, by estimation, is 65 to 70%. Left ventricular ejection fraction by PLAX is 66 %. The left ventricle has normal function. The left ventricle has no regional wall motion abnormalities. There is severe asymmetric left ventricular hypertrophy of the basal-septal segment. Left ventricular diastolic parameters are consistent with  Grade I diastolic dysfunction (impaired relaxation). 2. Right ventricular systolic function is low normal. The right ventricular size is normal. 3. Left atrial size was moderately dilated. 4. The mitral valve is grossly normal. Trivial mitral valve regurgitation. 5. The aortic valve is tricuspid. Aortic valve regurgitation is trivial. 6. Aortic dilatation noted. There is mild dilatation of the ascending aorta, measuring 40 mm.   Comparison(s): Changes from prior study  are noted. 03/12/2022: LVEF 60-65%, grade 2 DD.   Coronary angiogram 01/06/2018:  Mid LAD 20% stenosis, otherwise no significant disease. Normal LVEF. 140 mmHg intraventricular PG noted. LVEDP moderate to severely elevated.    Past Medical History:  Diagnosis Date   CAD (coronary artery disease)    see PSH section for details   Carotid bruit 05/28/2010   Tortuosity of carotids on doppler u/s, no stenosis.   Cataract    Chronic diastolic heart failure (HCC)    Grd II DD   Colon cancer screening    cologuard POSITIVE 09/2017-->colonoscopy 12/19/17; multiple adenomatous polyps, recall 3 yrs.   Diabetes mellitus    type 2- non insulin -requiring   DOE (dyspnea on exertion)    Cardiology w/u unrevealing except LVOT obstruction: suspect dyspnea due to HOCM, morbid obesity and OSA--stable as of 02/2017 cardiol f/u.   Dysrhythmia    Fatty liver u/s and CT 2009   w/mildly elevated transaminases; u/s 12/2012 reconfirmed dx.  Hep B and C testing negative.   Gout    Heart murmur    Hiatal hernia    History of iron deficiency anemia 02/2019   Hb 12.9 02/2019->home hemoccults ordered but pt never returned these.  GI referral was done but pt did not return their call/unable to be contacted. Fall 2021 iron low but Hb normal->iron supp brought iron levels up (hemoccults neg x 3).   History of kidney stones    Hx of adenomatous colonic polyps 12/27/2017   Hyperlipidemia    Hypertension    severe LVH on echo   Hypertrophic obstructive cardiomyopathy (HOCM) (HCC) 2012   LVOT obstruction stable on echo 01/09/12 and again on f/u echo 09/29/14, 11/2016, 10/2017--with evidence of HOCM due to chordal systolic anterior motion of MV leaflet. Grd II DD.     Moderate persistent asthma 04/19/2014   Morbid obesity (HCC)    OSA on CPAP    Could not tolerate CPAP, not surgical candidate per Dr. Karis.  Restarted CPAP (auto titrate 5-15 with full facemask--Dr. Jude 2017.  Getting mask fitting/leak issues worked out as  of 07/2016 pulm f/u.   Osteoarthritis, multiple sites    primarily knees and back.   PAF (paroxysmal atrial fibrillation) (HCC)    2024.  DCCV 03/2023   Pneumonia    Stricture and stenosis of esophagus    Venous insufficiency of both lower extremities     Past Surgical History:  Procedure Laterality Date   CARDIOVASCULAR STRESS TEST  2006; 04/27/10; 10/2017   Normal stress nuclear study, normal EF.  2019: EF 40%, inferior ischemia--intermediate risk study-->Dr. Ladona to do cath.   CARDIOVERSION N/A 04/15/2023   Procedure: CARDIOVERSION;  Surgeon: Loni Soyla LABOR, MD;  Location: [facility] INVASIVE CV LAB;  Service: Cardiovascular;  Laterality: N/A;   CARDIOVERSION     COLONOSCOPY  x 3   Most recent-->12/19/17 (after + cologuard) multiple adenomatous polyps-->recall 3 yrs.   LEFT HEART CATH AND CORONARY ANGIOGRAPHY N/A 01/06/2018   One area of 80% ostial stenosis--small and hard to visualize. No intervention.  DAPT for >1  yr recommended.  Procedure: LEFT HEART CATH AND CORONARY ANGIOGRAPHY;  Surgeon: Ladona Heinz, MD;  Location: [facility] INVASIVE CV LAB;  Service: Cardiovascular;  Laterality: N/A;   left rotator cuff surgery      NASAL SINUS SURGERY     Sleep study  07/2016   OSA; CPAP trial at 10 cm H20 recommended by pulm (Dr. everitt Dios-- pulm).   SPINE SURGERY  1970   ? Discectomy   TEE WITHOUT CARDIOVERSION  05/2010   Dr. Ladona; severe LVH, no valvular abnormalities   TRANSESOPHAGEAL ECHOCARDIOGRAM  06/06/10; 12/2011; 09/2014; 11/2016   LVOT obst with 135 mm Hg PG. HOCM. No AV obstruction to flow. No mitral regurg..  Repeat echo 01/09/12 no change.  2016: EF 60%, severe conc LV hypertrophy, normal global wall motion, LA mildly dilated, systolic anterior motion of mitral valve chord, mild MR, mild TR, mild pulm HTN--no signif change since 2013.  Repeat echo 11/2016 and 10/2017 no signif change compared to prior studies.   TRANSTHORACIC ECHOCARDIOGRAM  05/28/2010; 10/2017   2012; Normal LVEF, LVOT obst,  lateral wall hypokin.  10/2017: EF 55%, hypertrophic CM, grd II dd, normal global wall motion. 02/2022 no change except now with severe LA dilation.  02/2023 No signif change    MEDICATIONS:  acetaminophen  (TYLENOL ) 650 MG CR tablet   albuterol  (PROAIR  HFA) 108 (90 Base) MCG/ACT inhaler   allopurinol  (ZYLOPRIM ) 300 MG tablet   amiodarone  (PACERONE ) 200 MG tablet   apixaban  (ELIQUIS ) 5 MG TABS tablet   cetirizine (ZYRTEC) 10 MG tablet   Cholecalciferol (VITAMIN D) 2000 UNITS tablet   diclofenac  Sodium (VOLTAREN ) 1 % GEL   docusate sodium  (COLACE) 100 MG capsule   empagliflozin  (JARDIANCE ) 25 MG TABS tablet   fluticasone  (FLONASE ) 50 MCG/ACT nasal spray   Fluticasone  Furoate (ARNUITY ELLIPTA ) 200 MCG/ACT AEPB   glipiZIDE  (GLUCOTROL  XL) 10 MG 24 hr tablet   glucose blood (ONE TOUCH ULTRA TEST) test strip   metFORMIN  (GLUCOPHAGE ) 1000 MG tablet   metoprolol  tartrate (LOPRESSOR ) 50 MG tablet   NON FORMULARY   ondansetron  (ZOFRAN ) 8 MG tablet   ONE TOUCH LANCETS MISC   pantoprazole  (PROTONIX ) 40 MG tablet   rosuvastatin  (CRESTOR ) 20 MG tablet   traZODone  (DESYREL ) 50 MG tablet   verapamil  (CALAN -SR) 120 MG CR tablet   No current facility-administered medications for this encounter.   Burnard CHRISTELLA Odis DEVONNA [facility] Surgical Short Stay/Anesthesiology [facility] Phone [phone] 05/15/2023 8:57 AM

## 2023-05-15 NOTE — Anesthesia Preprocedure Evaluation (Addendum)
Anesthesia Evaluation  Patient identified by MRN, date of birth, ID band Patient awake    Reviewed: Allergy & Precautions, NPO status , Patient's Chart, lab work & pertinent test results  Airway Mallampati: III  TM Distance: >3 FB Neck ROM: Full    Dental  (+) Teeth Intact, Dental Advisory Given   Pulmonary asthma , sleep apnea and Continuous Positive Airway Pressure Ventilation , former smoker    + wheezing      Cardiovascular hypertension, + CAD and + DOE  + dysrhythmias Atrial Fibrillation + Valvular Problems/Murmurs  Rhythm:Regular Rate:Normal     Neuro/Psych  Neuromuscular disease    GI/Hepatic Neg liver ROS, hiatal hernia,GERD  Medicated,,  Endo/Other  diabetes, Type 2, Oral Hypoglycemic Agents    Renal/GU negative Renal ROS     Musculoskeletal  (+) Arthritis ,    Abdominal   Peds  Hematology negative hematology ROS (+)   Anesthesia Other Findings - HLD  Reproductive/Obstetrics                             Anesthesia Physical Anesthesia Plan  ASA: 3  Anesthesia Plan: Spinal   Post-op Pain Management: Tylenol PO (pre-op)*, Toradol IV (intra-op)* and Regional block*   Induction: Intravenous  PONV Risk Score and Plan: 2 and Propofol infusion and Ondansetron  Airway Management Planned: Natural Airway  Additional Equipment: None  Intra-op Plan:   Post-operative Plan:   Informed Consent: I have reviewed the patients History and Physical, chart, labs and discussed the procedure including the risks, benefits and alternatives for the proposed anesthesia with the patient or authorized representative who has indicated his/her understanding and acceptance.       Plan Discussed with:   Anesthesia Plan Comments: (See PAT note from 1/14 by K Gekas PA-C  Breathing tx in pre-op.  Lab Results      Component                Value               Date                      WBC                       8.9                 05/13/2023                HGB                      17.0                05/13/2023                HCT                      52.5 (H)            05/13/2023                MCV                      95.5                05/13/2023                PLT  144 (L)             05/13/2023           )        Anesthesia Quick Evaluation

## 2023-05-16 NOTE — Care Plan (Signed)
Ortho Bundle Case Management Note  Patient Details  Name: Jack [name] MRN: [id number] Date of Birth: [birth year]  spoke with patient and his wife several times. willd discharge to home with family to assist. has RW. OPPT set up with [facility] [facility] discharge instructions mailed and discussed. Patient and MD in agreement with plan. Choice offered                     DME Arranged:    DME Agency:     HH Arranged:    HH Agency:     Additional Comments: Please contact me with any questions of if this plan should need to change.  Shauna Hugh,  RN,BSN,MHA,CCM  [facility]opa[facility] Specialist  [phone] 05/16/2023, 8:28 AM

## 2023-05-19 ENCOUNTER — Telehealth: Payer: Self-pay

## 2023-05-19 NOTE — Telephone Encounter (Signed)
OTC mucinex dm

## 2023-05-19 NOTE — Progress Notes (Signed)
63 Spoke with Jack [name] informed his surgery as changed to 0730 and to arrive at 0515. Asked to drink his pre surgery drink by 0430 or before he goes to bed.  To follow other medication instructions per PST visit take by 0430.   No questions, repeated instructions back.

## 2023-05-19 NOTE — Telephone Encounter (Signed)
Pt and pts wife made aware.

## 2023-05-19 NOTE — Telephone Encounter (Signed)
Pt wants to see if there is anything he can take otc for a cough that he has been having    Copied from CRM #[id number]. Topic: Clinical - Medical Advice >> May 19, 2023  8:24 AM Isabell A wrote: Reason for CRM: Patient states he's having knee replacement tomorrow & he's experiencing a little bit of flame that's bursting with a cough once in a while. Patient would like to know what he can take the wont affect tomorrows surgery.

## 2023-05-19 NOTE — H&P (Signed)
TOTAL KNEE ADMISSION H&P  Patient is being admitted for right total knee arthroplasty.  Subjective:  Chief Complaint:right knee pain.  HPI: Jack [name], 78 y.o. male, has a history of pain and functional disability in the right knee due to arthritis and has failed non-surgical conservative treatments for greater than 12 weeks to includeNSAID's and/or analgesics, corticosteriod injections, viscosupplementation injections, flexibility and strengthening excercises, use of assistive devices, weight reduction as appropriate, and activity modification.  Onset of symptoms was gradual, starting 5 years ago with gradually worsening course since that time. The patient noted no past surgery on the right knee(s).  Patient currently rates pain in the right knee(s) at 10 out of 10 with activity. Patient has night pain, worsening of pain with activity and weight bearing, pain that interferes with activities of daily living, crepitus, and joint swelling.  Patient has evidence of subchondral cysts, subchondral sclerosis, periarticular osteophytes, and joint space narrowing by imaging studies. There is no active infection.  Patient Active Problem List   Diagnosis Date Noted   Hypercoagulable state due to persistent atrial fibrillation (HCC) 05/05/2023   Persistent atrial fibrillation (HCC) 05/05/2023   Hx of adenomatous colonic polyps 12/27/2017   Shortness of breath 12/25/2017   Moderate persistent asthma 04/19/2014   Laryngopharyngeal reflux (LPR) 12/16/2012   Heat intolerance 10/29/2012   Cough 05/06/2012   Fatty liver    Controlled diabetes mellitus type II without complication (HCC) 05/14/2010   Hyperlipidemia 05/14/2010   GOUT, UNSPECIFIED 05/14/2010   OBESITY, MORBID 05/14/2010   Obstructive sleep apnea 05/14/2010   Primary hypertension 05/14/2010   SYSTOLIC MURMUR 05/14/2010   Hypertrophic obstructive cardiomyopathy (HOCM) (HCC) 04/29/2010   Past Medical History:  Diagnosis Date   CAD  (coronary artery disease)    see PSH section for details   Carotid bruit 05/28/2010   Tortuosity of carotids on doppler u/s, no stenosis.   Cataract    Chronic diastolic heart failure (HCC)    Grd II DD   Colon cancer screening    cologuard POSITIVE 09/2017-->colonoscopy 12/19/17; multiple adenomatous polyps, recall 3 yrs.   Diabetes mellitus    type 2- non insulin-requiring   DOE (dyspnea on exertion)    Cardiology w/u unrevealing except LVOT obstruction: suspect dyspnea due to HOCM, morbid obesity and OSA--stable as of 02/2017 cardiol f/u.   Dysrhythmia    Fatty liver u/s and CT 2009   w/mildly elevated transaminases; u/s 12/2012 reconfirmed dx.  Hep B and C testing negative.   Gout    Heart murmur    Hiatal hernia    History of iron deficiency anemia 02/2019   Hb 12.9 02/2019->home hemoccults ordered but pt never returned these.  GI referral was done but pt did not return their call/unable to be contacted. Fall 2021 iron low but Hb normal->iron supp brought iron levels up (hemoccults neg x 3).   History of kidney stones    Hx of adenomatous colonic polyps 12/27/2017   Hyperlipidemia    Hypertension    severe LVH on echo   Hypertrophic obstructive cardiomyopathy (HOCM) (HCC) 2012   LVOT obstruction stable on echo 01/09/12 and again on f/u echo 09/29/14, 11/2016, 10/2017--with evidence of HOCM due to chordal systolic anterior motion of MV leaflet. Grd II DD.     Moderate persistent asthma 04/19/2014   Morbid obesity (HCC)    OSA on CPAP    Could not tolerate CPAP, not surgical candidate per Dr. Suszanne Conners.  Restarted CPAP (auto titrate 5-15 with full facemask--Dr.  Alva 2017.  Getting mask fitting/leak issues worked out as of 07/2016 pulm f/u.   Osteoarthritis, multiple sites    primarily knees and back.   PAF (paroxysmal atrial fibrillation) (HCC)    2024.  DCCV 03/2023   Pneumonia    Stricture and stenosis of esophagus    Venous insufficiency of both lower extremities     Past Surgical  History:  Procedure Laterality Date   CARDIOVASCULAR STRESS TEST  2006; 04/27/10; 10/2017   Normal stress nuclear study, normal EF.  2019: EF 40%, inferior ischemia--intermediate risk study-->Dr. Jacinto Halim to do cath.   CARDIOVERSION N/A 04/15/2023   Procedure: CARDIOVERSION;  Surgeon: Parke Poisson, MD;  Location: [facility] INVASIVE CV LAB;  Service: Cardiovascular;  Laterality: N/A;   CARDIOVERSION     COLONOSCOPY  x 3   Most recent-->12/19/17 (after + cologuard) multiple adenomatous polyps-->recall 3 yrs.   LEFT HEART CATH AND CORONARY ANGIOGRAPHY N/A 01/06/2018   One area of 80% ostial stenosis--small and hard to visualize. No intervention.  DAPT for >1 yr recommended.  Procedure: LEFT HEART CATH AND CORONARY ANGIOGRAPHY;  Surgeon: Yates Decamp, MD;  Location: [facility] INVASIVE CV LAB;  Service: Cardiovascular;  Laterality: N/A;   left rotator cuff surgery      NASAL SINUS SURGERY     Sleep study  07/2016   OSA; CPAP trial at 10 cm H20 recommended by pulm (Dr. Tommi Rumps Dios--[facility]).   SPINE SURGERY  1970   ? Discectomy   TEE WITHOUT CARDIOVERSION  05/2010   Dr. Jacinto Halim; severe LVH, no valvular abnormalities   TRANSESOPHAGEAL ECHOCARDIOGRAM  06/06/10; 12/2011; 09/2014; 11/2016   LVOT obst with 135 mm Hg PG. HOCM. No AV obstruction to flow. No mitral regurg..  Repeat echo 01/09/12 no change.  2016: EF 60%, severe conc LV hypertrophy, normal global wall motion, LA mildly dilated, systolic anterior motion of mitral valve chord, mild MR, mild TR, mild pulm HTN--no signif change since 2013.  Repeat echo 11/2016 and 10/2017 no signif change compared to prior studies.   TRANSTHORACIC ECHOCARDIOGRAM  05/28/2010; 10/2017   2012; Normal LVEF, LVOT obst, lateral wall hypokin.  10/2017: EF 55%, hypertrophic CM, grd II dd, normal global wall motion. 02/2022 no change except now with severe LA dilation.  02/2023 No signif change    No current facility-administered medications for this encounter.   Current Outpatient Medications   Medication Sig Dispense Refill Last Dose/Taking   acetaminophen (TYLENOL) 650 MG CR tablet Take 1,300 mg by mouth every 8 (eight) hours as needed for pain.   Taking As Needed   albuterol (PROAIR HFA) 108 (90 Base) MCG/ACT inhaler INHALE 2 PUFFS INTO THE LUNGS EVERY 4 HOURS AS NEEDED FOR WHEEZING 8.5 g 0 Taking   amiodarone (PACERONE) 200 MG tablet Take 1 tablet (200 mg total) by mouth as directed. 1 tab twice daily for 10 day then 1 tab daily 45 tablet 2 Taking   apixaban (ELIQUIS) 5 MG TABS tablet Take 1 tablet (5 mg total) by mouth 2 (two) times daily. 60 tablet 6 Taking   cetirizine (ZYRTEC) 10 MG tablet Take 10 mg by mouth daily as needed for allergies.   Taking As Needed   Cholecalciferol (VITAMIN D) 2000 UNITS tablet Take 2,000 Units by mouth daily.   Taking   diclofenac Sodium (VOLTAREN) 1 % GEL Apply 2 g topically 4 (four) times daily. (Patient taking differently: Apply 2 g topically 4 (four) times daily as needed (joint pain).) 100 g 3 Taking Differently  docusate sodium (COLACE) 100 MG capsule Take 100 mg by mouth every other day.   Taking   empagliflozin (JARDIANCE) 25 MG TABS tablet Take 1 tablet (25 mg total) by mouth daily before breakfast. 90 tablet 3 Taking   fluticasone (FLONASE) 50 MCG/ACT nasal spray USE 2 SPRAYS IN BOTH NOSTRILS  DAILY AS NEEDED FOR ALLERGIES 64 g 1 Taking   Fluticasone Furoate (ARNUITY ELLIPTA) 200 MCG/ACT AEPB 1 puff qd (Patient taking differently: Inhale 1 puff into the lungs daily as needed (shortness of breath).) 90 each 3 Taking Differently   metoprolol tartrate (LOPRESSOR) 50 MG tablet TAKE 1 TABLET BY MOUTH TWICE  DAILY 200 tablet 2 Taking   NON FORMULARY Pt uses a c-pap nightly   Taking   ondansetron (ZOFRAN) 8 MG tablet Take 1 tablet (8 mg total) by mouth every 8 (eight) hours as needed for nausea or vomiting. 20 tablet 0 Taking As Needed   rosuvastatin (CRESTOR) 20 MG tablet TAKE 1 TABLET BY MOUTH AT  BEDTIME 90 tablet 0 Taking   traZODone (DESYREL)  50 MG tablet TAKE 1 TABLET(50 MG) BY MOUTH AT BEDTIME (Patient taking differently: Take 50 mg by mouth at bedtime as needed for sleep.) 30 tablet 1 Taking Differently   verapamil (CALAN-SR) 120 MG CR tablet TAKE 1 TABLET BY MOUTH IN THE  MORNING 100 tablet 2 Taking   allopurinol (ZYLOPRIM) 300 MG tablet Take 1 tablet (300 mg total) by mouth 2 (two) times daily. 180 tablet 1    glipiZIDE (GLUCOTROL XL) 10 MG 24 hr tablet Take 1 tablet (10 mg total) by mouth daily. 90 tablet 3    glucose blood (ONE TOUCH ULTRA TEST) test strip Use as instructed to check blood sugar.  DX 250.00 100 each prn    metFORMIN (GLUCOPHAGE) 1000 MG tablet Take 1 tablet (1,000 mg total) by mouth 2 (two) times daily with a meal. 180 tablet 3    ONE TOUCH LANCETS MISC Use as directed to check blood sugar.  DX 250.00 200 each prn    pantoprazole (PROTONIX) 40 MG tablet Take 1 tablet (40 mg total) by mouth every other day. 90 tablet 0    No Known Allergies  Social History   Tobacco Use   Smoking status: Former    Current packs/day: 0.00    Average packs/day: 0.5 packs/day for 40.0 years (20.0 ttl pk-yrs)    Types: Cigarettes, Cigars    Start date: 04/29/1958    Quit date: 04/29/1998    Years since quitting: 25.0   Smokeless tobacco: Never   Tobacco comments:    4 cigars  Substance Use Topics   Alcohol use: Yes    Comment: rare    Family History  Problem Relation Age of Onset   Diabetes Mother    Prostate cancer Father        Prostate   Breast cancer Sister    Diabetes Maternal Aunt    Breast cancer Maternal Aunt    Hyperlipidemia Sister    Hypertension Neg Hx    Coronary artery disease Neg Hx    Colon cancer Neg Hx    Stomach cancer Neg Hx    Rectal cancer Neg Hx    Esophageal cancer Neg Hx      Review of Systems  Musculoskeletal:  Positive for arthralgias.       Right knee  All other systems reviewed and are negative.   Objective:  Physical Exam Constitutional:      Appearance: Normal appearance.  HENT:     Head: Normocephalic and atraumatic.     Mouth/Throat:     Pharynx: Oropharynx is clear.  Eyes:     Extraocular Movements: Extraocular movements intact.  Cardiovascular:     Rate and Rhythm: Normal rate and regular rhythm.  Pulmonary:     Effort: Pulmonary effort is normal.  Abdominal:     Palpations: Abdomen is soft.  Musculoskeletal:     Cervical back: Normal range of motion.     Comments: Right knee motion is about 0-110.  There is crepitation.  I do not feel an effusion.  He has pain along the medial joint line.  Hip motion is good and straight leg raise is negative.    Skin:    General: Skin is warm and dry.  Neurological:     General: No focal deficit present.     Mental Status: He is alert and oriented to person, place, and time.  Psychiatric:        Mood and Affect: Mood normal.        Behavior: Behavior normal.        Thought Content: Thought content normal.        Judgment: Judgment normal.     Vital signs in last 24 hours:    Labs:   Estimated body mass index is 39.62 kg/m as calculated from the following:   Height as of 05/13/23: 5\' 8"  (1.727 m).   Weight as of 05/13/23: 118.2 kg.   Imaging Review Plain radiographs demonstrate severe degenerative joint disease of the right knee(s). The overall alignment isneutral. The bone quality appears to be good for age and reported activity level.      Assessment/Plan:  End stage primary arthritis, right knee   The patient history, physical examination, clinical judgment of the provider and imaging studies are consistent with end stage degenerative joint disease of the right knee(s) and total knee arthroplasty is deemed medically necessary. The treatment options including medical management, injection therapy arthroscopy and arthroplasty were discussed at length. The risks and benefits of total knee arthroplasty were presented and reviewed. The risks due to aseptic loosening, infection, stiffness, patella  tracking problems, thromboembolic complications and other imponderables were discussed. The patient acknowledged the explanation, agreed to proceed with the plan and consent was signed. Patient is being admitted for inpatient treatment for surgery, pain control, PT, OT, prophylactic antibiotics, VTE prophylaxis, progressive ambulation and ADL's and discharge planning. The patient is planning to be discharged home with home health services  Patient's anticipated LOS is less than 2 midnights, meeting these requirements: - Younger than 67 - Lives within 1 hour of care - Has a competent adult at home to recover with post-op recover - NO history of  - Chronic pain requiring opiods  - Diabetes  - Coronary Artery Disease  - Heart failure  - Heart attack  - Stroke  - DVT/VTE  - Cardiac arrhythmia  - Respiratory Failure/COPD  - Renal failure  - Anemia  - Advanced Liver disease

## 2023-05-20 ENCOUNTER — Encounter (HOSPITAL_COMMUNITY): Payer: Self-pay | Admitting: Orthopaedic Surgery

## 2023-05-20 ENCOUNTER — Other Ambulatory Visit: Payer: Self-pay

## 2023-05-20 ENCOUNTER — Ambulatory Visit (HOSPITAL_COMMUNITY): Payer: Medicare Other | Admitting: Medical

## 2023-05-20 ENCOUNTER — Ambulatory Visit (HOSPITAL_COMMUNITY): Payer: Medicare Other | Admitting: Anesthesiology

## 2023-05-20 ENCOUNTER — Observation Stay (HOSPITAL_COMMUNITY)
Admission: RE | Admit: 2023-05-20 | Discharge: 2023-05-21 | Disposition: A | Discharge status: Home / Self Care | Payer: Medicare Other | Attending: Orthopaedic Surgery | Admitting: Orthopaedic Surgery

## 2023-05-20 ENCOUNTER — Encounter (HOSPITAL_COMMUNITY)
Admission: RE | Disposition: A | Discharge status: Home / Self Care | Payer: Self-pay | Source: Home / Self Care | Attending: Orthopaedic Surgery

## 2023-05-20 DIAGNOSIS — E119 Type 2 diabetes mellitus without complications: Secondary | ICD-10-CM | POA: Insufficient documentation

## 2023-05-20 DIAGNOSIS — I2510 Atherosclerotic heart disease of native coronary artery without angina pectoris: Secondary | ICD-10-CM

## 2023-05-20 DIAGNOSIS — I110 Hypertensive heart disease with heart failure: Secondary | ICD-10-CM | POA: Diagnosis not present

## 2023-05-20 DIAGNOSIS — Z96651 Presence of right artificial knee joint: Secondary | ICD-10-CM | POA: Diagnosis not present

## 2023-05-20 DIAGNOSIS — G8918 Other acute postprocedural pain: Secondary | ICD-10-CM | POA: Diagnosis not present

## 2023-05-20 DIAGNOSIS — I4811 Longstanding persistent atrial fibrillation: Secondary | ICD-10-CM | POA: Diagnosis not present

## 2023-05-20 DIAGNOSIS — I5032 Chronic diastolic (congestive) heart failure: Secondary | ICD-10-CM | POA: Diagnosis not present

## 2023-05-20 DIAGNOSIS — I480 Paroxysmal atrial fibrillation: Secondary | ICD-10-CM | POA: Insufficient documentation

## 2023-05-20 DIAGNOSIS — M1711 Unilateral primary osteoarthritis, right knee: Principal | ICD-10-CM | POA: Insufficient documentation

## 2023-05-20 DIAGNOSIS — Z87891 Personal history of nicotine dependence: Secondary | ICD-10-CM | POA: Diagnosis not present

## 2023-05-20 DIAGNOSIS — Z7984 Long term (current) use of oral hypoglycemic drugs: Secondary | ICD-10-CM | POA: Insufficient documentation

## 2023-05-20 DIAGNOSIS — Z79899 Other long term (current) drug therapy: Secondary | ICD-10-CM | POA: Diagnosis not present

## 2023-05-20 DIAGNOSIS — Z01818 Encounter for other preprocedural examination: Secondary | ICD-10-CM

## 2023-05-20 HISTORY — PX: TOTAL KNEE ARTHROPLASTY: SHX125

## 2023-05-20 LAB — GLUCOSE, CAPILLARY
Glucose-Capillary: 114 mg/dL — ABNORMAL HIGH (ref 70–99)
Glucose-Capillary: 139 mg/dL — ABNORMAL HIGH (ref 70–99)
Glucose-Capillary: 192 mg/dL — ABNORMAL HIGH (ref 70–99)
Glucose-Capillary: 307 mg/dL — ABNORMAL HIGH (ref 70–99)
Glucose-Capillary: 327 mg/dL — ABNORMAL HIGH (ref 70–99)
Glucose-Capillary: 263 mg/dL — ABNORMAL HIGH (ref 70–99)

## 2023-05-20 SURGERY — ARTHROPLASTY, KNEE, TOTAL
Anesthesia: Spinal | Site: Knee | Laterality: Right

## 2023-05-20 MED ORDER — ONDANSETRON HCL 4 MG/2ML IJ SOLN: 4.0000 mg | Freq: Four times a day (QID) | INTRAMUSCULAR | Status: DC | PRN

## 2023-05-20 MED ORDER — PROPOFOL 10 MG/ML IV BOLUS
INTRAVENOUS | Status: AC
  Filled 2023-05-20: qty 20

## 2023-05-20 MED ORDER — METOPROLOL TARTRATE 50 MG PO TABS
50.0000 mg | ORAL_TABLET | Freq: Two times a day (BID) | ORAL | Status: DC
  Administered 2023-05-20 – 2023-05-21 (×2): 50 mg via ORAL
  Filled 2023-05-20 (×2): qty 1

## 2023-05-20 MED ORDER — ALBUTEROL SULFATE (2.5 MG/3ML) 0.083% IN NEBU
2.5000 mg | INHALATION_SOLUTION | Freq: Once | RESPIRATORY_TRACT | Status: AC
  Administered 2023-05-20: 2.5 mg via RESPIRATORY_TRACT

## 2023-05-20 MED ORDER — BISACODYL 5 MG PO TBEC: 5.0000 mg | DELAYED_RELEASE_TABLET | Freq: Every day | ORAL | Status: DC | PRN

## 2023-05-20 MED ORDER — CEFAZOLIN SODIUM-DEXTROSE 2-4 GM/100ML-% IV SOLN
2.0000 g | Freq: Four times a day (QID) | INTRAVENOUS | Status: AC
  Administered 2023-05-20 (×2): 2 g via INTRAVENOUS
  Filled 2023-05-20 (×2): qty 100

## 2023-05-20 MED ORDER — INSULIN ASPART 100 UNIT/ML IJ SOLN
0.0000 [IU] | Freq: Three times a day (TID) | INTRAMUSCULAR | Status: DC
  Administered 2023-05-20: 11 [IU] via SUBCUTANEOUS
  Administered 2023-05-21: 5 [IU] via SUBCUTANEOUS

## 2023-05-20 MED ORDER — LORATADINE 10 MG PO TABS
10.0000 mg | ORAL_TABLET | Freq: Every day | ORAL | Status: DC
  Administered 2023-05-21: 10 mg via ORAL
  Filled 2023-05-20: qty 1

## 2023-05-20 MED ORDER — METOCLOPRAMIDE HCL 5 MG PO TABS: 5.0000 mg | ORAL_TABLET | Freq: Three times a day (TID) | ORAL | Status: DC | PRN

## 2023-05-20 MED ORDER — LACTATED RINGERS IV SOLN: INTRAVENOUS | Status: DC

## 2023-05-20 MED ORDER — MENTHOL 3 MG MT LOZG: 1.0000 | LOZENGE | OROMUCOSAL | Status: DC | PRN

## 2023-05-20 MED ORDER — METHOCARBAMOL 500 MG PO TABS
500.0000 mg | ORAL_TABLET | Freq: Four times a day (QID) | ORAL | Status: DC | PRN
  Administered 2023-05-20: 500 mg via ORAL
  Filled 2023-05-20: qty 1

## 2023-05-20 MED ORDER — POVIDONE-IODINE 10 % EX SWAB
2.0000 | Freq: Once | CUTANEOUS | Status: AC
  Administered 2023-05-20: 2 via TOPICAL

## 2023-05-20 MED ORDER — METOCLOPRAMIDE HCL 5 MG/ML IJ SOLN: 5.0000 mg | Freq: Three times a day (TID) | INTRAMUSCULAR | Status: DC | PRN

## 2023-05-20 MED ORDER — HYDROCODONE-ACETAMINOPHEN 7.5-325 MG PO TABS
1.0000 | ORAL_TABLET | ORAL | Status: DC | PRN
  Administered 2023-05-20 – 2023-05-21 (×3): 1 via ORAL
  Filled 2023-05-20 (×3): qty 1

## 2023-05-20 MED ORDER — AMIODARONE HCL 200 MG PO TABS
200.0000 mg | ORAL_TABLET | Freq: Every day | ORAL | Status: DC
  Administered 2023-05-21: 200 mg via ORAL
  Filled 2023-05-20 (×2): qty 1

## 2023-05-20 MED ORDER — PROPOFOL 500 MG/50ML IV EMUL
INTRAVENOUS | Status: DC | PRN
  Administered 2023-05-20: 25 ug/kg/min via INTRAVENOUS

## 2023-05-20 MED ORDER — DIPHENHYDRAMINE HCL 12.5 MG/5ML PO ELIX: 12.5000 mg | ORAL_SOLUTION | ORAL | Status: DC | PRN

## 2023-05-20 MED ORDER — CHLORHEXIDINE GLUCONATE 0.12 % MT SOLN
15.0000 mL | Freq: Once | OROMUCOSAL | Status: AC
  Administered 2023-05-20: 15 mL via OROMUCOSAL

## 2023-05-20 MED ORDER — TRAZODONE HCL 50 MG PO TABS: 50.0000 mg | ORAL_TABLET | Freq: Every evening | ORAL | Status: DC | PRN

## 2023-05-20 MED ORDER — MIDAZOLAM HCL 5 MG/5ML IJ SOLN
INTRAMUSCULAR | Status: DC | PRN
  Administered 2023-05-20: 2 mg via INTRAVENOUS

## 2023-05-20 MED ORDER — ALBUTEROL SULFATE (2.5 MG/3ML) 0.083% IN NEBU
2.5000 mg | INHALATION_SOLUTION | RESPIRATORY_TRACT | Status: DC | PRN
Start: 2023-05-20 — End: 2023-05-21
  Administered 2023-05-20: 2.5 mg via RESPIRATORY_TRACT
  Filled 2023-05-20: qty 3

## 2023-05-20 MED ORDER — CEFAZOLIN SODIUM-DEXTROSE 2-4 GM/100ML-% IV SOLN
2.0000 g | INTRAVENOUS | Status: AC
  Administered 2023-05-20: 3 g via INTRAVENOUS
  Filled 2023-05-20: qty 100

## 2023-05-20 MED ORDER — FENTANYL CITRATE (PF) 100 MCG/2ML IJ SOLN
INTRAMUSCULAR | Status: AC
  Filled 2023-05-20: qty 2

## 2023-05-20 MED ORDER — INSULIN ASPART 100 UNIT/ML IJ SOLN
0.0000 [IU] | Freq: Every day | INTRAMUSCULAR | Status: DC
  Administered 2023-05-20: 3 [IU] via SUBCUTANEOUS

## 2023-05-20 MED ORDER — 0.9 % SODIUM CHLORIDE (POUR BTL) OPTIME
TOPICAL | Status: DC | PRN
  Administered 2023-05-20: 1000 mL

## 2023-05-20 MED ORDER — EMPAGLIFLOZIN 25 MG PO TABS
25.0000 mg | ORAL_TABLET | Freq: Every day | ORAL | Status: DC
  Administered 2023-05-21: 25 mg via ORAL
  Filled 2023-05-20: qty 1

## 2023-05-20 MED ORDER — ORAL CARE MOUTH RINSE: 15.0000 mL | Freq: Once | OROMUCOSAL | Status: AC

## 2023-05-20 MED ORDER — METHOCARBAMOL 1000 MG/10ML IJ SOLN: 500.0000 mg | Freq: Four times a day (QID) | INTRAMUSCULAR | Status: DC | PRN

## 2023-05-20 MED ORDER — HYDROMORPHONE HCL 1 MG/ML IJ SOLN
0.2500 mg | INTRAMUSCULAR | Status: DC | PRN
Start: 2023-05-20 — End: 2023-05-20

## 2023-05-20 MED ORDER — ROPIVACAINE HCL 5 MG/ML IJ SOLN
INTRAMUSCULAR | Status: DC | PRN
  Administered 2023-05-20: 30 mL via PERINEURAL

## 2023-05-20 MED ORDER — MIDAZOLAM HCL 2 MG/2ML IJ SOLN
INTRAMUSCULAR | Status: AC
Start: 2023-05-20 — End: ?
  Filled 2023-05-20: qty 2

## 2023-05-20 MED ORDER — TRANEXAMIC ACID-NACL 1000-0.7 MG/100ML-% IV SOLN
1000.0000 mg | INTRAVENOUS | Status: AC
  Administered 2023-05-20: 1000 mg via INTRAVENOUS
  Filled 2023-05-20: qty 100

## 2023-05-20 MED ORDER — ACETAMINOPHEN 10 MG/ML IV SOLN: 1000.0000 mg | Freq: Once | INTRAVENOUS | Status: DC | PRN

## 2023-05-20 MED ORDER — ONDANSETRON HCL 4 MG PO TABS: 4.0000 mg | ORAL_TABLET | Freq: Four times a day (QID) | ORAL | Status: DC | PRN

## 2023-05-20 MED ORDER — ROSUVASTATIN CALCIUM 20 MG PO TABS
20.0000 mg | ORAL_TABLET | Freq: Every day | ORAL | Status: DC
  Administered 2023-05-20: 20 mg via ORAL
  Filled 2023-05-20: qty 1

## 2023-05-20 MED ORDER — HYDROCODONE-ACETAMINOPHEN 5-325 MG PO TABS: 1.0000 | ORAL_TABLET | ORAL | Status: DC | PRN

## 2023-05-20 MED ORDER — KETOROLAC TROMETHAMINE 15 MG/ML IJ SOLN
INTRAMUSCULAR | Status: AC
  Filled 2023-05-20: qty 1

## 2023-05-20 MED ORDER — SODIUM CHLORIDE (PF) 0.9 % IJ SOLN
INTRAMUSCULAR | Status: DC | PRN
  Administered 2023-05-20: 80 mL

## 2023-05-20 MED ORDER — ACETAMINOPHEN 500 MG PO TABS
500.0000 mg | ORAL_TABLET | Freq: Four times a day (QID) | ORAL | Status: AC
  Administered 2023-05-20 – 2023-05-21 (×4): 500 mg via ORAL
  Filled 2023-05-20 (×3): qty 1

## 2023-05-20 MED ORDER — APIXABAN 5 MG PO TABS
5.0000 mg | ORAL_TABLET | Freq: Two times a day (BID) | ORAL | Status: DC
  Administered 2023-05-21: 5 mg via ORAL
  Filled 2023-05-20: qty 1

## 2023-05-20 MED ORDER — TRANEXAMIC ACID 1000 MG/10ML IV SOLN
2000.0000 mg | INTRAVENOUS | Status: DC
  Filled 2023-05-20: qty 20

## 2023-05-20 MED ORDER — BUPIVACAINE LIPOSOME 1.3 % IJ SUSP
INTRAMUSCULAR | Status: AC
  Filled 2023-05-20: qty 20

## 2023-05-20 MED ORDER — BUPIVACAINE LIPOSOME 1.3 % IJ SUSP: 20.0000 mL | Freq: Once | INTRAMUSCULAR | Status: DC

## 2023-05-20 MED ORDER — VERAPAMIL HCL ER 120 MG PO TBCR
120.0000 mg | EXTENDED_RELEASE_TABLET | Freq: Every morning | ORAL | Status: DC
  Administered 2023-05-21: 120 mg via ORAL
  Filled 2023-05-20: qty 1

## 2023-05-20 MED ORDER — METFORMIN HCL 500 MG PO TABS
1000.0000 mg | ORAL_TABLET | Freq: Two times a day (BID) | ORAL | Status: DC
  Administered 2023-05-20 – 2023-05-21 (×2): 1000 mg via ORAL
  Filled 2023-05-20 (×2): qty 2

## 2023-05-20 MED ORDER — GLIPIZIDE ER 5 MG PO TB24
10.0000 mg | ORAL_TABLET | Freq: Every day | ORAL | Status: DC
  Administered 2023-05-20: 10 mg via ORAL
  Filled 2023-05-20: qty 2

## 2023-05-20 MED ORDER — PANTOPRAZOLE SODIUM 40 MG PO TBEC
40.0000 mg | DELAYED_RELEASE_TABLET | ORAL | Status: DC
  Administered 2023-05-21: 40 mg via ORAL
  Filled 2023-05-20: qty 1

## 2023-05-20 MED ORDER — FENTANYL CITRATE (PF) 100 MCG/2ML IJ SOLN
INTRAMUSCULAR | Status: DC | PRN
  Administered 2023-05-20 (×2): 50 ug via INTRAVENOUS

## 2023-05-20 MED ORDER — ALUM & MAG HYDROXIDE-SIMETH 200-200-20 MG/5ML PO SUSP: 30.0000 mL | ORAL | Status: DC | PRN

## 2023-05-20 MED ORDER — ACETAMINOPHEN 160 MG/5ML PO SOLN: 325.0000 mg | Freq: Once | ORAL | Status: DC | PRN

## 2023-05-20 MED ORDER — MEPERIDINE HCL 50 MG/ML IJ SOLN
6.2500 mg | INTRAMUSCULAR | Status: DC | PRN
Start: 2023-05-20 — End: 2023-05-20

## 2023-05-20 MED ORDER — INSULIN ASPART 100 UNIT/ML IJ SOLN: 0.0000 [IU] | INTRAMUSCULAR | Status: DC | PRN

## 2023-05-20 MED ORDER — ONDANSETRON HCL 4 MG/2ML IJ SOLN
INTRAMUSCULAR | Status: DC | PRN
  Administered 2023-05-20: 4 mg via INTRAVENOUS

## 2023-05-20 MED ORDER — PROPOFOL 1000 MG/100ML IV EMUL
INTRAVENOUS | Status: AC
Start: 2023-05-20 — End: ?
  Filled 2023-05-20: qty 100

## 2023-05-20 MED ORDER — TRANEXAMIC ACID 1000 MG/10ML IV SOLN
INTRAVENOUS | Status: DC | PRN
  Administered 2023-05-20: 2000 mg via TOPICAL

## 2023-05-20 MED ORDER — STERILE WATER FOR IRRIGATION IR SOLN
Status: DC | PRN
  Administered 2023-05-20: 1000 mL

## 2023-05-20 MED ORDER — BUPIVACAINE IN DEXTROSE 0.75-8.25 % IT SOLN
INTRATHECAL | Status: DC | PRN
  Administered 2023-05-20: 2 mL via INTRATHECAL

## 2023-05-20 MED ORDER — DROPERIDOL 2.5 MG/ML IJ SOLN: 0.6250 mg | Freq: Once | INTRAMUSCULAR | Status: DC | PRN

## 2023-05-20 MED ORDER — ALBUTEROL SULFATE (2.5 MG/3ML) 0.083% IN NEBU
INHALATION_SOLUTION | RESPIRATORY_TRACT | Status: AC
  Filled 2023-05-20: qty 3

## 2023-05-20 MED ORDER — ALLOPURINOL 300 MG PO TABS
300.0000 mg | ORAL_TABLET | Freq: Two times a day (BID) | ORAL | Status: DC
  Administered 2023-05-20 – 2023-05-21 (×2): 300 mg via ORAL
  Filled 2023-05-20 (×2): qty 1

## 2023-05-20 MED ORDER — MUPIROCIN 2 % EX OINT
TOPICAL_OINTMENT | Freq: Two times a day (BID) | CUTANEOUS | Status: DC
  Filled 2023-05-20: qty 22

## 2023-05-20 MED ORDER — SODIUM CHLORIDE (PF) 0.9 % IJ SOLN
INTRAMUSCULAR | Status: AC
  Filled 2023-05-20: qty 30

## 2023-05-20 MED ORDER — PHENOL 1.4 % MT LIQD: 1.0000 | OROMUCOSAL | Status: DC | PRN

## 2023-05-20 MED ORDER — SODIUM CHLORIDE 0.9 % IR SOLN
Status: DC | PRN
  Administered 2023-05-20: 1000 mL

## 2023-05-20 MED ORDER — ACETAMINOPHEN 500 MG PO TABS
ORAL_TABLET | ORAL | Status: AC
  Filled 2023-05-20: qty 1

## 2023-05-20 MED ORDER — CHLORHEXIDINE GLUCONATE 4 % EX SOLN: 1.0000 | CUTANEOUS | 1 refills | Status: AC

## 2023-05-20 MED ORDER — BUPIVACAINE-EPINEPHRINE 0.25% -1:200000 IJ SOLN
INTRAMUSCULAR | Status: AC
Start: 2023-05-20 — End: ?
  Filled 2023-05-20: qty 1

## 2023-05-20 MED ORDER — KETOROLAC TROMETHAMINE 15 MG/ML IJ SOLN
7.5000 mg | Freq: Four times a day (QID) | INTRAMUSCULAR | Status: AC
  Administered 2023-05-20 – 2023-05-21 (×4): 7.5 mg via INTRAVENOUS
  Filled 2023-05-20 (×3): qty 1

## 2023-05-20 MED ORDER — ACETAMINOPHEN 325 MG PO TABS: 325.0000 mg | ORAL_TABLET | Freq: Once | ORAL | Status: DC | PRN

## 2023-05-20 MED ORDER — MORPHINE SULFATE (PF) 2 MG/ML IV SOLN: 0.5000 mg | INTRAVENOUS | Status: DC | PRN

## 2023-05-20 MED ORDER — EPHEDRINE SULFATE (PRESSORS) 50 MG/ML IJ SOLN
INTRAMUSCULAR | Status: DC | PRN
  Administered 2023-05-20 (×2): 10 mg via INTRAVENOUS

## 2023-05-20 MED ORDER — ACETAMINOPHEN 325 MG PO TABS: 325.0000 mg | ORAL_TABLET | Freq: Four times a day (QID) | ORAL | Status: DC | PRN

## 2023-05-20 MED ORDER — DEXAMETHASONE SODIUM PHOSPHATE 4 MG/ML IJ SOLN
INTRAMUSCULAR | Status: DC | PRN
  Administered 2023-05-20: 8 mg via INTRAVENOUS

## 2023-05-20 MED ORDER — BUPIVACAINE-EPINEPHRINE 0.25% -1:200000 IJ SOLN
INTRAMUSCULAR | Status: AC
  Filled 2023-05-20: qty 1

## 2023-05-20 MED ORDER — MUPIROCIN 2 % EX OINT: 1.0000 | TOPICAL_OINTMENT | Freq: Two times a day (BID) | CUTANEOUS | 0 refills | Status: AC

## 2023-05-20 MED ORDER — DOCUSATE SODIUM 100 MG PO CAPS
100.0000 mg | ORAL_CAPSULE | Freq: Two times a day (BID) | ORAL | Status: DC
  Administered 2023-05-20 – 2023-05-21 (×3): 100 mg via ORAL
  Filled 2023-05-20 (×3): qty 1

## 2023-05-20 MED ORDER — PHENYLEPHRINE HCL-NACL 20-0.9 MG/250ML-% IV SOLN
INTRAVENOUS | Status: DC | PRN
  Administered 2023-05-20: 50 ug/min via INTRAVENOUS

## 2023-05-20 SURGICAL SUPPLY — 52 items
ATTUNE MED DOME PAT 38 KNEE (Knees) IMPLANT
ATTUNE PS FEM RT SZ 6 CEM KNEE (Femur) IMPLANT
ATTUNE PSRP INSR SZ6 8 KNEE (Insert) IMPLANT
BAG COUNTER SPONGE SURGICOUNT (BAG) ×1 IMPLANT
BAG DECANTER FOR FLEXI CONT (MISCELLANEOUS) ×1 IMPLANT
BAG ZIPLOCK 12X15 (MISCELLANEOUS) ×1 IMPLANT
BASE TIBIAL ROT PLAT SZ 8 KNEE (Knees) IMPLANT
BLADE SAGITTAL 25.0X1.19X90 (BLADE) ×1 IMPLANT
BLADE SAW SGTL 11.0X1.19X90.0M (BLADE) ×1 IMPLANT
BLADE SURG SZ10 CARB STEEL (BLADE) ×1 IMPLANT
BNDG ELASTIC 6INX 5YD STR LF (GAUZE/BANDAGES/DRESSINGS) ×1 IMPLANT
BNDG ELASTIC 6X10 VLCR STRL LF (GAUZE/BANDAGES/DRESSINGS) IMPLANT
BOOTIES KNEE HIGH SLOAN (MISCELLANEOUS) ×1 IMPLANT
BOWL SMART MIX CTS (DISPOSABLE) ×1 IMPLANT
CEMENT HV SMART SET (Cement) ×2 IMPLANT
CLSR STERI-STRIP ANTIMIC 1/2X4 (GAUZE/BANDAGES/DRESSINGS) IMPLANT
COVER SURGICAL LIGHT HANDLE (MISCELLANEOUS) ×1 IMPLANT
CUFF TRNQT CYL 34X4.125X (TOURNIQUET CUFF) ×1 IMPLANT
DRAPE TOP 10253 STERILE (DRAPES) ×1 IMPLANT
DRAPE U-SHAPE 47X51 STRL (DRAPES) ×1 IMPLANT
DRSG AQUACEL AG ADV 3.5X10 (GAUZE/BANDAGES/DRESSINGS) ×1 IMPLANT
DURAPREP 26ML APPLICATOR (WOUND CARE) ×2 IMPLANT
ELECT REM PT RETURN 15FT ADLT (MISCELLANEOUS) ×1 IMPLANT
GLOVE BIO SURGEON STRL SZ8 (GLOVE) ×2 IMPLANT
GLOVE BIOGEL PI IND STRL 7.0 (GLOVE) ×1 IMPLANT
GLOVE BIOGEL PI IND STRL 8 (GLOVE) ×2 IMPLANT
GLOVE SURG SYN 7.0 (GLOVE) ×1 IMPLANT
GLOVE SURG SYN 7.0 PF PI (GLOVE) ×1 IMPLANT
GOWN SRG XL LVL 4 BRTHBL STRL (GOWNS) ×1 IMPLANT
GOWN STRL REUS W/ TWL XL LVL3 (GOWN DISPOSABLE) ×2 IMPLANT
HOLDER FOLEY CATH W/STRAP (MISCELLANEOUS) IMPLANT
HOOD PEEL AWAY T7 (MISCELLANEOUS) ×3 IMPLANT
KIT TURNOVER KIT A (KITS) IMPLANT
MANIFOLD NEPTUNE II (INSTRUMENTS) ×1 IMPLANT
NS IRRIG 1000ML POUR BTL (IV SOLUTION) ×1 IMPLANT
PACK TOTAL KNEE CUSTOM (KITS) ×1 IMPLANT
PAD ARMBOARD 7.5X6 YLW CONV (MISCELLANEOUS) ×1 IMPLANT
PIN STEINMAN FIXATION KNEE (PIN) IMPLANT
PROTECTOR NERVE ULNAR (MISCELLANEOUS) ×1 IMPLANT
SET HNDPC FAN SPRY TIP SCT (DISPOSABLE) ×1 IMPLANT
SPIKE FLUID TRANSFER (MISCELLANEOUS) ×2 IMPLANT
SUT ETHIBOND NAB CT1 #1 30IN (SUTURE) ×1 IMPLANT
SUT STRATAFIX 0 PDS 27 VIOLET (SUTURE) ×1 IMPLANT
SUT VIC AB 0 CT1 36 (SUTURE) ×1 IMPLANT
SUT VIC AB 2-0 CT1 TAPERPNT 27 (SUTURE) ×1 IMPLANT
SUT VICRYL AB 3-0 FS1 BRD 27IN (SUTURE) ×1 IMPLANT
SUTURE STRATFX 0 PDS 27 VIOLET (SUTURE) ×1 IMPLANT
TIBIAL BASE ROT PLAT SZ 8 KNEE (Knees) ×1 IMPLANT
TRAY FOLEY MTR SLVR 16FR STAT (SET/KITS/TRAYS/PACK) IMPLANT
WATER STERILE IRR 1000ML POUR (IV SOLUTION) ×2 IMPLANT
WRAP KNEE MAXI GEL POST OP (GAUZE/BANDAGES/DRESSINGS) ×1 IMPLANT
YANKAUER SUCT BULB TIP NO VENT (SUCTIONS) ×1 IMPLANT

## 2023-05-20 NOTE — Anesthesia Procedure Notes (Signed)
Anesthesia Regional Block: Adductor canal block   Pre-Anesthetic Checklist: , timeout performed,  Correct Patient, Correct Site, Correct Laterality,  Correct Procedure, Correct Position, site marked,  Risks and benefits discussed,  Surgical consent,  Pre-op evaluation,  At surgeon's request and post-op pain management  Laterality: Right  Prep: chloraprep       Needles:  Injection technique: Single-shot  Needle Type: Echogenic Stimulator Needle     Needle Length: 9cm  Needle Gauge: 21     Additional Needles:   Procedures:,,,, ultrasound used (permanent image in chart),,    Narrative:  Start time: 05/20/2023 7:10 AM End time: 05/20/2023 7:15 AM Injection made incrementally with aspirations every 5 mL.  Performed by: Personally  Anesthesiologist: Shelton Silvas, MD  Additional Notes: Discussed risks and benefits of the nerve block in detail, including but not limited vascular injury, permanent nerve damage and infection.   Patient tolerated the procedure well. Local anesthetic introduced in an incremental fashion under minimal resistance after negative aspirations. No paresthesias were elicited. After completion of the procedure, no acute issues were identified and patient continued to be monitored by RN.

## 2023-05-20 NOTE — Transfer of Care (Signed)
Immediate Anesthesia Transfer of Care Note  Patient: Jack [name]  Procedure(s) Performed: RIGHT TOTAL KNEE ARTHROPLASTY (Right: Knee)  Patient Location: PACU  Anesthesia Type:MAC  Level of Consciousness: awake and alert   Airway & Oxygen Therapy: Patient Spontanous Breathing and Patient connected to face mask oxygen  Post-op Assessment: Report given to RN and Post -op Vital signs reviewed and stable  Post vital signs: Reviewed and stable  Last Vitals:  Vitals Value Taken Time  BP 122/64 05/20/23 0941  Temp    Pulse 58 05/20/23 0944  Resp 17 05/20/23 0944  SpO2 100 % 05/20/23 0944  Vitals shown include unfiled device data.  Last Pain:  Vitals:   05/20/23 0611  TempSrc: Oral  PainSc:          Complications: No notable events documented.

## 2023-05-20 NOTE — Evaluation (Signed)
Physical Therapy Evaluation Patient Details Name: Jack [name] MRN: [id number] DOB: 1945/05/11 Today's Date: 05/20/2023  History of Present Illness  78 yo male presents to therapy s/p R TKA on 05/20/2023 due to failure of conservative measures. Pt PMH includes but is not limited to: A-fib s/p cardioversion asthma, SOB, DM II, HLD, gout, OSA, HTN, hypertrophic obstructive cardiomyopathy, CAD s/p cath, dCHF and spinal surgery.  Clinical Impression   Jack [name] is a 78 y.o. male POD 0 s/p R TKA. Patient reports IND with mobility at baseline. Patient is now limited by functional impairments (see PT problem list below) and requires S for bed mobility and CGA and cues for transfers. Patient was able to ambulate 55 feet with RW and CGA level of assist. Patient instructed in exercise to facilitate ROM and circulation to manage edema. Patient will benefit from continued skilled PT interventions to address impairments and progress towards PLOF. Acute PT will follow to progress mobility and stair training in preparation for safe discharge home with family support and [facility] services.         If plan is discharge home, recommend the following: A little help with walking and/or transfers;A little help with bathing/dressing/bathroom;Assistance with cooking/housework;Assist for transportation;Help with stairs or ramp for entrance   Can travel by private vehicle        Equipment Recommendations None recommended by PT  Recommendations for Other Services       Functional Status Assessment Patient has had a recent decline in their functional status and demonstrates the ability to make significant improvements in function in a reasonable and predictable amount of time.     Precautions / Restrictions Precautions Precautions: Fall;Knee Restrictions Weight Bearing Restrictions Per Provider Order: No      Mobility  Bed Mobility Overal bed mobility: Needs Assistance Bed Mobility: Supine to Sit      Supine to sit: Supervision, HOB elevated, Used rails     General bed mobility comments: min cues    Transfers Overall transfer level: Needs assistance Equipment used: Rolling walker (2 wheels) Transfers: Sit to/from Stand Sit to Stand: Contact guard assist, From elevated surface           General transfer comment: min cues    Ambulation/Gait Ambulation/Gait assistance: Contact guard assist Gait Distance (Feet): 55 Feet Assistive device: Rolling walker (2 wheels) Gait Pattern/deviations: Step-to pattern, Antalgic, Trunk flexed Gait velocity: decreased     General Gait Details: slight trunk flexion and use of B UE support at RW for offloading R LE in stance phase, with lateral sway noted and min cues for posture and proper distance from Rw.  Stairs            Wheelchair Mobility     Tilt Bed    Modified Rankin (Stroke Patients Only)       Balance Overall balance assessment: Needs assistance Sitting-balance support: Feet supported Sitting balance-Leahy Scale: Good     Standing balance support: Bilateral upper extremity supported, During functional activity, Reliant on assistive device for balance Standing balance-Leahy Scale: Poor                               Pertinent Vitals/Pain Pain Assessment Pain Assessment: 0-10 Pain Score: 6  Pain Location: R knee and LE Pain Descriptors / Indicators: Aching, Constant, Discomfort, Dull, Grimacing, Operative site guarding, Sore Pain Intervention(s): Limited activity within patient's tolerance, Monitored during session, Premedicated before session, Repositioned, Ice applied  Home Living Family/patient expects to be discharged to:: Private residence Living Arrangements: Spouse/significant other Available Help at Discharge: Family Type of Home: House Home Access: Stairs to enter Entrance Stairs-Rails: None Entrance Stairs-Number of Steps: 1 + threshold   Home Layout: One level Home Equipment:  Agricultural consultant (2 wheels);Cane - single point      Prior Function Prior Level of Function : Independent/Modified Independent;Driving             Mobility Comments: IND with all ADLs, self care tasks and IADLs no AD       Extremity/Trunk Assessment        Lower Extremity Assessment Lower Extremity Assessment: RLE deficits/detail RLE Deficits / Details: ankle DF/PF 5/5; SLR , 10 degree lag RLE Sensation: WNL    Cervical / Trunk Assessment Cervical / Trunk Assessment: Back Surgery  Communication   Communication Communication: No apparent difficulties  Cognition Arousal: Alert Behavior During Therapy: WFL for tasks assessed/performed Overall Cognitive Status: Within Functional Limits for tasks assessed                                          General Comments      Exercises Total Joint Exercises Ankle Circles/Pumps: AROM, Both, 10 reps   Assessment/Plan    PT Assessment Patient needs continued PT services  PT Problem List Decreased strength;Decreased range of motion;Decreased activity tolerance;Decreased balance;Decreased mobility;Decreased coordination;Pain       PT Treatment Interventions DME instruction;Gait training;Stair training;Functional mobility training;Therapeutic activities;Therapeutic exercise;Balance training;Neuromuscular re-education;Modalities;Patient/family education    PT Goals (Current goals can be found in the Care Plan section)  Acute Rehab PT Goals Patient Stated Goal: to be able to amb on uneven surfaces, pittle in the garage and ride the tractor, be outside PT Goal Formulation: With patient Time For Goal Achievement: 06/03/23 Potential to Achieve Goals: Good    Frequency Min 1X/week     Co-evaluation               AM-PAC PT "6 Clicks" Mobility  Outcome Measure Help needed turning from your back to your side while in a flat bed without using bedrails?: None Help needed moving from lying on your back to  sitting on the side of a flat bed without using bedrails?: None Help needed moving to and from a bed to a chair (including a wheelchair)?: A Little Help needed standing up from a chair using your arms (e.g., wheelchair or bedside chair)?: A Little Help needed to walk in hospital room?: A Little Help needed climbing 3-5 steps with a railing? : A Lot 6 Click Score: 19    End of Session Equipment Utilized During Treatment: Gait belt Activity Tolerance: Patient tolerated treatment well Patient left: in chair;with call bell/phone within reach;with family/visitor present Nurse Communication: Mobility status;Patient requests pain meds PT Visit Diagnosis: Unsteadiness on feet (R26.81);Other abnormalities of gait and mobility (R26.89);Muscle weakness (generalized) (M62.81);Difficulty in walking, not elsewhere classified (R26.2);Pain Pain - Right/Left: Right Pain - part of body: Knee;Leg    Time: 4098-1191 PT Time Calculation (min) (ACUTE ONLY): 33 min   Charges:   PT Evaluation $PT Eval Low Complexity: 1 Low PT Treatments $Gait Training: 8-22 mins PT General Charges $$ ACUTE PT VISIT: 1 Visit         Johnny Bridge, PT Acute Rehab   Jacqualyn Posey 05/20/2023, 5:14 PM

## 2023-05-20 NOTE — Interval H&P Note (Signed)
History and Physical Interval Note:  05/20/2023 7:26 AM  Jack [name]  has presented today for surgery, with the diagnosis of RIGHT KNEE DEGENERATIVE JOINT DISEASE.  The various methods of treatment have been discussed with the patient and family. After consideration of risks, benefits and other options for treatment, the patient has consented to  Procedure(s): RIGHT TOTAL KNEE ARTHROPLASTY (Right) as a surgical intervention.  The patient's history has been reviewed, patient examined, no change in status, stable for surgery.  I have reviewed the patient's chart and labs.  Questions were answered to the patient's satisfaction.     Velna Ochs

## 2023-05-20 NOTE — Anesthesia Procedure Notes (Signed)
Spinal  Start time: 05/20/2023 7:45 AM End time: 05/20/2023 7:52 AM Reason for block: surgical anesthesia Staffing Performed: anesthesiologist  Anesthesiologist: Shelton Silvas, MD Performed by: Shelton Silvas, MD Authorized by: Shelton Silvas, MD   Preanesthetic Checklist Completed: patient identified, IV checked, site marked, risks and benefits discussed, surgical consent, monitors and equipment checked, pre-op evaluation and timeout performed Spinal Block Patient position: sitting Prep: DuraPrep and site prepped and draped Location: L2-3 Injection technique: single-shot Needle Needle type: Pencan  Needle gauge: 24 G Needle length: 10 cm Needle insertion depth: 10 cm Additional Notes Patient tolerated well. No immediate complications.  Poor positioning.  L3/4 - Unsuccessful x2, L2/3 successful.   Functioning IV was confirmed and monitors were applied. Sterile prep and drape, including hand hygiene and sterile gloves were used. The patient was positioned and the back was prepped. The skin was anesthetized with lidocaine. Free flow of clear CSF was obtained prior to injecting local anesthetic into the CSF. The spinal needle aspirated freely following injection. The needle was carefully withdrawn. The patient tolerated the procedure well.

## 2023-05-20 NOTE — Anesthesia Postprocedure Evaluation (Signed)
Anesthesia Post Note  Patient: Jack [name]  Procedure(s) Performed: RIGHT TOTAL KNEE ARTHROPLASTY (Right: Knee)     Patient location during evaluation: PACU Anesthesia Type: Spinal Level of consciousness: oriented and awake and alert Pain management: pain level controlled Vital Signs Assessment: post-procedure vital signs reviewed and stable Respiratory status: spontaneous breathing, respiratory function stable and patient connected to nasal cannula oxygen Cardiovascular status: blood pressure returned to baseline and stable Postop Assessment: no headache, no backache and no apparent nausea or vomiting Anesthetic complications: no  No notable events documented.  Last Vitals:  Vitals:   05/20/23 1100 05/20/23 1126  BP: 138/75 121/67  Pulse: (!) 53 (!) 55  Resp: 15 17  Temp:  36.4 C  SpO2: 92% 94%    Last Pain:  Vitals:   05/20/23 1127  TempSrc:   PainSc: 0-No pain                 Shelton Silvas

## 2023-05-20 NOTE — Op Note (Signed)
PREOP DIAGNOSIS: DJD RIGHT KNEE POSTOP DIAGNOSIS: same PROCEDURE: RIGHT TKR ANESTHESIA: Spinal and MAC ATTENDING SURGEON: Velna Ochs ASSISTANT: Jack Florence PA  INDICATIONS FOR PROCEDURE: Jack [name] is a 78 y.o. male who has struggled for a long time with pain due to degenerative arthritis of the right knee.  The patient has failed many conservative non-operative measures and at this point has pain which limits the ability to sleep and walk.  The patient is offered total knee replacement.  Informed operative consent was obtained after discussion of possible risks of anesthesia, infection, neurovascular injury, DVT, and death.  The importance of the post-operative rehabilitation protocol to optimize result was stressed extensively with the patient.  SUMMARY OF FINDINGS AND PROCEDURE:  Jack [name] was taken to the operative suite where under the above anesthesia a right knee replacement was performed.  There were advanced degenerative changes and the bone quality was excellent.  We used the DePuy Attune system and placed size 6 femur, 8 tibia, 38 mm all polyethylene patella, and a size 8 mm spacer.  Jack Florence PA-C assisted throughout and was invaluable to the completion of the case in that he helped retract and maintain exposure while I placed components.  He also helped close thereby minimizing OR time.  The patient was admitted for appropriate post-op care to include perioperative antibiotics and mechanical and pharmacologic measures for DVT prophylaxis.  DESCRIPTION OF PROCEDURE:  Jack [name] was taken to the operative suite where the above anesthesia was applied.  The patient was positioned supine and prepped and draped in normal sterile fashion.  An appropriate time out was performed.  After the administration of kefzol pre-op antibiotic the leg was elevated and exsanguinated and a tourniquet inflated. A standard longitudinal incision was made on the anterior knee.  Dissection was  carried down to the extensor mechanism.  All appropriate anti-infective measures were used including the pre-operative antibiotic, betadine impregnated drape, and closed hooded exhaust systems for each member of the surgical team.  A medial parapatellar incision was made in the extensor mechanism and the knee cap flipped and the knee flexed.  Some residual meniscal tissues were removed along with any remaining ACL/PCL tissue.  A guide was placed on the tibia and a flat cut was made on it's superior surface.  An intramedullary guide was placed in the femur and was utilized to make anterior and posterior cuts creating an appropriate flexion gap.  A second intramedullary guide was placed in the femur to make a distal cut properly balancing the knee with an extension gap equal to the flexion gap.  The three bones sized to the above mentioned sizes and the appropriate guides were placed and utilized.  A trial reduction was done and the knee easily came to full extension and the patella tracked well on flexion.  The trial components were removed and all bones were cleaned with pulsatile lavage and then dried thoroughly.  Cement was mixed and was pressurized onto the bones followed by placement of the aforementioned components.  Excess cement was trimmed and pressure was held on the components until the cement had hardened.  The tourniquet was deflated and a small amount of bleeding was controlled with cautery and pressure.  The knee was irrigated thoroughly.  The extensor mechanism was re-approximated with #1 ethibond in interrupted fashion.  The knee was flexed and the repair was solid.  The subcutaneous tissues were re-approximated with #0 and #2-0 vicryl and the skin closed with  a subcuticular stitch and steristrips.  A sterile dressing was applied.  Intraoperative fluids, EBL, and tourniquet time can be obtained from anesthesia records.  DISPOSITION:  The patient was taken to recovery room in stable condition and  scheduled to potentially go home same day depending on ability to walk and tolerate liquids.Jack [name] 05/20/2023, 9:10 AM

## 2023-05-21 ENCOUNTER — Encounter (HOSPITAL_COMMUNITY): Payer: Self-pay | Admitting: Orthopaedic Surgery

## 2023-05-21 DIAGNOSIS — I2510 Atherosclerotic heart disease of native coronary artery without angina pectoris: Secondary | ICD-10-CM | POA: Diagnosis not present

## 2023-05-21 DIAGNOSIS — I480 Paroxysmal atrial fibrillation: Secondary | ICD-10-CM | POA: Diagnosis not present

## 2023-05-21 DIAGNOSIS — Z79899 Other long term (current) drug therapy: Secondary | ICD-10-CM | POA: Diagnosis not present

## 2023-05-21 DIAGNOSIS — Z87891 Personal history of nicotine dependence: Secondary | ICD-10-CM | POA: Diagnosis not present

## 2023-05-21 DIAGNOSIS — M1711 Unilateral primary osteoarthritis, right knee: Secondary | ICD-10-CM | POA: Diagnosis not present

## 2023-05-21 DIAGNOSIS — I110 Hypertensive heart disease with heart failure: Secondary | ICD-10-CM | POA: Diagnosis not present

## 2023-05-21 DIAGNOSIS — E119 Type 2 diabetes mellitus without complications: Secondary | ICD-10-CM | POA: Diagnosis not present

## 2023-05-21 DIAGNOSIS — Z7984 Long term (current) use of oral hypoglycemic drugs: Secondary | ICD-10-CM | POA: Diagnosis not present

## 2023-05-21 DIAGNOSIS — I5032 Chronic diastolic (congestive) heart failure: Secondary | ICD-10-CM | POA: Diagnosis not present

## 2023-05-21 DIAGNOSIS — M1712 Unilateral primary osteoarthritis, left knee: Secondary | ICD-10-CM | POA: Diagnosis not present

## 2023-05-21 LAB — GLUCOSE, CAPILLARY: Glucose-Capillary: 218 mg/dL — ABNORMAL HIGH (ref 70–99)

## 2023-05-21 MED ORDER — TIZANIDINE HCL 4 MG PO TABS: 4.0000 mg | ORAL_TABLET | Freq: Four times a day (QID) | ORAL | 1 refills | Status: AC | PRN

## 2023-05-21 MED ORDER — HYDROCODONE-ACETAMINOPHEN 5-325 MG PO TABS: 1.0000 | ORAL_TABLET | Freq: Four times a day (QID) | ORAL | 0 refills | Status: DC | PRN

## 2023-05-21 NOTE — Plan of Care (Signed)
Patient discharged home with wife via private vehicle. AVS and discharge instruction were provided and patient verbalized understanding. Haydee Salter, RN 05/21/23 12:06 PM

## 2023-05-21 NOTE — Progress Notes (Signed)
Subjective: 1 Day Post-Op Procedure(s) (LRB): RIGHT TOTAL KNEE ARTHROPLASTY (Right)  Patient feels great this morning. He is hoping to go home today.  Activity level:  wbat Diet tolerance:  ok Voiding:  ok Patient reports pain as mild.    Objective: Vital signs in last 24 hours: Temp:  [97.3 F (36.3 C)-97.8 F (36.6 C)] 97.3 F (36.3 C) (01/22 0612) Pulse Rate:  [53-81] 66 (01/22 0612) Resp:  [11-18] 18 (01/22 0612) BP: (112-152)/(58-83) 152/77 (01/22 0612) SpO2:  [90 %-100 %] 94 % (01/22 0612) Weight:  [118.2 kg] 118.2 kg (01/21 1215)  Labs: No results for input(s): "HGB" in the last 72 hours. No results for input(s): "WBC", "RBC", "HCT", "PLT" in the last 72 hours. No results for input(s): "NA", "K", "CL", "CO2", "BUN", "CREATININE", "GLUCOSE", "CALCIUM" in the last 72 hours. No results for input(s): "LABPT", "INR" in the last 72 hours.  Physical Exam:  Neurologically intact ABD soft Neurovascular intact Sensation intact distally Intact pulses distally Dorsiflexion/Plantar flexion intact Incision: dressing C/D/I and no drainage No cellulitis present Compartment soft  Assessment/Plan:  1 Day Post-Op Procedure(s) (LRB): RIGHT TOTAL KNEE ARTHROPLASTY (Right) Advance diet Up with therapy D/C IV fluids Discharge home with home health today once cleared by PT. Continue on home eliquis.  Follow up in office 2 weeks post op.    Ginger Organ Dereck Agerton 05/21/2023, 8:43 AM

## 2023-05-21 NOTE — Discharge Summary (Signed)
Patient ID: Jack [name] MRN: [id number] DOB/AGE: 10-07-1945 78 y.o.  Admit date: 05/20/2023 Discharge date: 05/21/2023  Admission Diagnoses:  Principal Problem:   Primary osteoarthritis of right knee   Discharge Diagnoses:  Same  Past Medical History:  Diagnosis Date   CAD (coronary artery disease)    see PSH section for details   Carotid bruit 05/28/2010   Tortuosity of carotids on doppler u/s, no stenosis.   Cataract    Chronic diastolic heart failure (HCC)    Grd II DD   Colon cancer screening    cologuard POSITIVE 09/2017-->colonoscopy 12/19/17; multiple adenomatous polyps, recall 3 yrs.   Diabetes mellitus    type 2- non insulin-requiring   DOE (dyspnea on exertion)    Cardiology w/u unrevealing except LVOT obstruction: suspect dyspnea due to HOCM, morbid obesity and OSA--stable as of 02/2017 cardiol f/u.   Dysrhythmia    Fatty liver u/s and CT 2009   w/mildly elevated transaminases; u/s 12/2012 reconfirmed dx.  Hep B and C testing negative.   Gout    Heart murmur    Hiatal hernia    History of iron deficiency anemia 02/2019   Hb 12.9 02/2019->home hemoccults ordered but pt never returned these.  GI referral was done but pt did not return their call/unable to be contacted. Fall 2021 iron low but Hb normal->iron supp brought iron levels up (hemoccults neg x 3).   History of kidney stones    Hx of adenomatous colonic polyps 12/27/2017   Hyperlipidemia    Hypertension    severe LVH on echo   Hypertrophic obstructive cardiomyopathy (HOCM) (HCC) 2012   LVOT obstruction stable on echo 01/09/12 and again on f/u echo 09/29/14, 11/2016, 10/2017--with evidence of HOCM due to chordal systolic anterior motion of MV leaflet. Grd II DD.     Moderate persistent asthma 04/19/2014   Morbid obesity (HCC)    OSA on CPAP    Could not tolerate CPAP, not surgical candidate per Dr. Suszanne Conners.  Restarted CPAP (auto titrate 5-15 with full facemask--Dr. Vassie Loll 2017.  Getting mask fitting/leak issues  worked out as of 07/2016 pulm f/u.   Osteoarthritis, multiple sites    primarily knees and back.   PAF (paroxysmal atrial fibrillation) (HCC)    2024.  DCCV 03/2023   Pneumonia    Stricture and stenosis of esophagus    Venous insufficiency of both lower extremities     Surgeries: Procedure(s): RIGHT TOTAL KNEE ARTHROPLASTY on 05/20/2023   Consultants:   Discharged Condition: Improved  Hospital Course: Jack [name] is an 78 y.o. male who was admitted 05/20/2023 for operative treatment ofPrimary osteoarthritis of right knee. Patient has severe unremitting pain that affects sleep, daily activities, and work/hobbies. After pre-op clearance the patient was taken to the operating room on 05/20/2023 and underwent  Procedure(s): RIGHT TOTAL KNEE ARTHROPLASTY.    Patient was given perioperative antibiotics:  Anti-infectives (From admission, onward)    Start     Dose/Rate Route Frequency Ordered Stop   05/20/23 1400  ceFAZolin (ANCEF) IVPB 2g/100 mL premix        2 g 200 mL/hr over 30 Minutes Intravenous Every 6 hours 05/20/23 0955 05/20/23 1954   05/20/23 0600  ceFAZolin (ANCEF) IVPB 2g/100 mL premix        2 g 200 mL/hr over 30 Minutes Intravenous On call to O.R. 05/20/23 0541 05/20/23 0800        Patient was given sequential compression devices, early ambulation, and chemoprophylaxis to prevent DVT.  Patient benefited maximally from hospital stay and there were no complications.    Recent vital signs: Patient Vitals for the past 24 hrs:  BP Temp Temp src Pulse Resp SpO2 Height Weight  05/21/23 0612 (!) 152/77 (!) 97.3 F (36.3 C) Oral 66 18 94 % -- --  05/21/23 0206 133/83 (!) 97.5 F (36.4 C) Oral 66 18 95 % -- --  05/20/23 2023 126/71 97.6 F (36.4 C) Oral 81 18 92 % -- --  05/20/23 1303 (!) 124/58 97.7 F (36.5 C) -- 73 17 94 % -- --  05/20/23 1215 -- -- -- -- -- -- 5' 7.99" (1.727 m) 118.2 kg  05/20/23 1209 -- -- -- -- -- 93 % -- --  05/20/23 1126 121/67 97.6 F (36.4 C)  Oral (!) 55 17 94 % -- --  05/20/23 1100 138/75 -- -- (!) 53 15 92 % -- --  05/20/23 1045 130/66 -- -- (!) 53 13 92 % -- --  05/20/23 1030 117/65 -- -- (!) 56 11 94 % -- --  05/20/23 1015 125/72 -- -- (!) 57 15 90 % -- --  05/20/23 1000 124/68 -- -- (!) 58 15 94 % -- --  05/20/23 0949 -- -- -- -- -- 95 % -- --  05/20/23 0945 112/83 97.8 F (36.6 C) -- (!) 58 17 100 % -- --     Recent laboratory studies: No results for input(s): "WBC", "HGB", "HCT", "PLT", "NA", "K", "CL", "CO2", "BUN", "CREATININE", "GLUCOSE", "INR", "CALCIUM" in the last 72 hours.  Invalid input(s): "PT", "2"   Discharge Medications:   Allergies as of 05/21/2023   No Known Allergies      Medication List     TAKE these medications    acetaminophen 650 MG CR tablet Commonly known as: TYLENOL Take 1,300 mg by mouth every 8 (eight) hours as needed for pain.   albuterol 108 (90 Base) MCG/ACT inhaler Commonly known as: ProAir HFA INHALE 2 PUFFS INTO THE LUNGS EVERY 4 HOURS AS NEEDED FOR WHEEZING   allopurinol 300 MG tablet Commonly known as: ZYLOPRIM Take 1 tablet (300 mg total) by mouth 2 (two) times daily.   amiodarone 200 MG tablet Commonly known as: PACERONE Take 1 tablet (200 mg total) by mouth as directed. 1 tab twice daily for 10 day then 1 tab daily   apixaban 5 MG Tabs tablet Commonly known as: Eliquis Take 1 tablet (5 mg total) by mouth 2 (two) times daily.   Arnuity Ellipta 200 MCG/ACT Aepb Generic drug: Fluticasone Furoate 1 puff qd What changed:  how much to take how to take this when to take this reasons to take this additional instructions   cetirizine 10 MG tablet Commonly known as: ZYRTEC Take 10 mg by mouth daily as needed for allergies.   chlorhexidine 4 % external liquid Commonly known as: HIBICLENS Apply 15 mLs (1 Application total) topically as directed for 30 doses. Use as directed daily for 5 days every other week for 6 weeks.   diclofenac Sodium 1 % Gel Commonly  known as: VOLTAREN Apply 2 g topically 4 (four) times daily. What changed:  when to take this reasons to take this   docusate sodium 100 MG capsule Commonly known as: COLACE Take 100 mg by mouth every other day.   empagliflozin 25 MG Tabs tablet Commonly known as: Jardiance Take 1 tablet (25 mg total) by mouth daily before breakfast.   fluticasone 50 MCG/ACT nasal spray Commonly known as: FLONASE USE  2 SPRAYS IN BOTH NOSTRILS  DAILY AS NEEDED FOR ALLERGIES   glipiZIDE 10 MG 24 hr tablet Commonly known as: GLUCOTROL XL Take 1 tablet (10 mg total) by mouth daily.   glucose blood test strip Commonly known as: ONE TOUCH ULTRA TEST Use as instructed to check blood sugar.  DX 250.00   HYDROcodone-acetaminophen 5-325 MG tablet Commonly known as: NORCO/VICODIN Take 1-2 tablets by mouth every 6 (six) hours as needed for moderate pain (pain score 4-6) or severe pain (pain score 7-10) (post op pain).   metFORMIN 1000 MG tablet Commonly known as: GLUCOPHAGE Take 1 tablet (1,000 mg total) by mouth 2 (two) times daily with a meal.   metoprolol tartrate 50 MG tablet Commonly known as: LOPRESSOR TAKE 1 TABLET BY MOUTH TWICE  DAILY   mupirocin ointment 2 % Commonly known as: BACTROBAN Place 1 Application into the nose 2 (two) times daily for 60 doses. Use as directed 2 times daily for 5 days every other week for 6 weeks.   NON FORMULARY Pt uses a c-pap nightly   ondansetron 8 MG tablet Commonly known as: Zofran Take 1 tablet (8 mg total) by mouth every 8 (eight) hours as needed for nausea or vomiting.   ONE TOUCH LANCETS Misc Use as directed to check blood sugar.  DX 250.00   pantoprazole 40 MG tablet Commonly known as: PROTONIX Take 1 tablet (40 mg total) by mouth every other day.   rosuvastatin 20 MG tablet Commonly known as: CRESTOR TAKE 1 TABLET BY MOUTH AT  BEDTIME   tiZANidine 4 MG tablet Commonly known as: Zanaflex Take 1 tablet (4 mg total) by mouth every 6 (six)  hours as needed for muscle spasms.   traZODone 50 MG tablet Commonly known as: DESYREL TAKE 1 TABLET(50 MG) BY MOUTH AT BEDTIME   verapamil 120 MG CR tablet Commonly known as: CALAN-SR TAKE 1 TABLET BY MOUTH IN THE  MORNING   Vitamin D 50 MCG (2000 UT) tablet Take 2,000 Units by mouth daily.               Durable Medical Equipment  (From admission, onward)           Start     Ordered   05/20/23 1127  DME Walker rolling  Once       Question:  Patient needs a walker to treat with the following condition  Answer:  Primary osteoarthritis of right knee   05/20/23 1126   05/20/23 1127  DME 3 n 1  Once        05/20/23 1126   05/20/23 1127  DME Bedside commode  Once       Question:  Patient needs a bedside commode to treat with the following condition  Answer:  Primary osteoarthritis of right knee   05/20/23 1126            Diagnostic Studies: No results found.  Disposition: Discharge disposition: 01-Home or Self Care       Discharge Instructions     Call MD / Call 911   Complete by: As directed    If you experience chest pain or shortness of breath, CALL 911 and be transported to the hospital emergency room.  If you develope a fever above 101 F, pus (white drainage) or increased drainage or redness at the wound, or calf pain, call your surgeon's office.   Constipation Prevention   Complete by: As directed    Drink plenty of fluids.  Prune  juice may be helpful.  You may use a stool softener, such as Colace (over the counter) 100 mg twice a day.  Use MiraLax (over the counter) for constipation as needed.   Diet - low sodium heart healthy   Complete by: As directed    Discharge instructions   Complete by: As directed    INSTRUCTIONS AFTER JOINT REPLACEMENT   Remove items at home which could result in a fall. This includes throw rugs or furniture in walking pathways ICE to the affected joint every three hours while awake for 30 minutes at a time, for at least  the first 3-5 days, and then as needed for pain and swelling.  Continue to use ice for pain and swelling. You may notice swelling that will progress down to the foot and ankle.  This is normal after surgery.  Elevate your leg when you are not up walking on it.   Continue to use the breathing machine you got in the hospital (incentive spirometer) which will help keep your temperature down.  It is common for your temperature to cycle up and down following surgery, especially at night when you are not up moving around and exerting yourself.  The breathing machine keeps your lungs expanded and your temperature down.   DIET:  As you were doing prior to hospitalization, we recommend a well-balanced diet.  DRESSING / WOUND CARE / SHOWERING  You may shower 3 days after surgery, but keep the wounds dry during showering.  You may use an occlusive plastic wrap (Press'n Seal for example), NO SOAKING/SUBMERGING IN THE BATHTUB.  If the bandage gets wet, change with a clean dry gauze.  If the incision gets wet, pat the wound dry with a clean towel.  ACTIVITY  Increase activity slowly as tolerated, but follow the weight bearing instructions below.   No driving for 6 weeks or until further direction given by your physician.  You cannot drive while taking narcotics.  No lifting or carrying greater than 10 lbs. until further directed by your surgeon. Avoid periods of inactivity such as sitting longer than an hour when not asleep. This helps prevent blood clots.  You may return to work once you are authorized by your doctor.     WEIGHT BEARING   Weight bearing as tolerated with assist device (walker, cane, etc) as directed, use it as long as suggested by your surgeon or therapist, typically at least 4-6 weeks.   EXERCISES  Results after joint replacement surgery are often greatly improved when you follow the exercise, range of motion and muscle strengthening exercises prescribed by your doctor. Safety  measures are also important to protect the joint from further injury. Any time any of these exercises cause you to have increased pain or swelling, decrease what you are doing until you are comfortable again and then slowly increase them. If you have problems or questions, call your caregiver or physical therapist for advice.   Rehabilitation is important following a joint replacement. After just a few days of immobilization, the muscles of the leg can become weakened and shrink (atrophy).  These exercises are designed to build up the tone and strength of the thigh and leg muscles and to improve motion. Often times heat used for twenty to thirty minutes before working out will loosen up your tissues and help with improving the range of motion but do not use heat for the first two weeks following surgery (sometimes heat can increase post-operative swelling).   These exercises  can be done on a training (exercise) mat, on the floor, on a table or on a bed. Use whatever works the best and is most comfortable for you.    Use music or television while you are exercising so that the exercises are a pleasant break in your day. This will make your life better with the exercises acting as a break in your routine that you can look forward to.   Perform all exercises about fifteen times, three times per day or as directed.  You should exercise both the operative leg and the other leg as well.  Exercises include:   Quad Sets - Tighten up the muscle on the front of the thigh (Quad) and hold for 5-10 seconds.   Straight Leg Raises - With your knee straight (if you were given a brace, keep it on), lift the leg to 60 degrees, hold for 3 seconds, and slowly lower the leg.  Perform this exercise against resistance later as your leg gets stronger.  Leg Slides: Lying on your back, slowly slide your foot toward your buttocks, bending your knee up off the floor (only go as far as is comfortable). Then slowly slide your foot back  down until your leg is flat on the floor again.  Angel Wings: Lying on your back spread your legs to the side as far apart as you can without causing discomfort.  Hamstring Strength:  Lying on your back, push your heel against the floor with your leg straight by tightening up the muscles of your buttocks.  Repeat, but this time bend your knee to a comfortable angle, and push your heel against the floor.  You may put a pillow under the heel to make it more comfortable if necessary.   A rehabilitation program following joint replacement surgery can speed recovery and prevent re-injury in the future due to weakened muscles. Contact your doctor or a physical therapist for more information on knee rehabilitation.    CONSTIPATION  Constipation is defined medically as fewer than three stools per week and severe constipation as less than one stool per week.  Even if you have a regular bowel pattern at home, your normal regimen is likely to be disrupted due to multiple reasons following surgery.  Combination of anesthesia, postoperative narcotics, change in appetite and fluid intake all can affect your bowels.   YOU MUST use at least one of the following options; they are listed in order of increasing strength to get the job done.  They are all available over the counter, and you may need to use some, POSSIBLY even all of these options:    Drink plenty of fluids (prune juice may be helpful) and high fiber foods Colace 100 mg by mouth twice a day  Senokot for constipation as directed and as needed Dulcolax (bisacodyl), take with full glass of water  Miralax (polyethylene glycol) once or twice a day as needed.  If you have tried all these things and are unable to have a bowel movement in the first 3-4 days after surgery call either your surgeon or your primary doctor.    If you experience loose stools or diarrhea, hold the medications until you stool forms back up.  If your symptoms do not get better within  1 week or if they get worse, check with your doctor.  If you experience "the worst abdominal pain ever" or develop nausea or vomiting, please contact the office immediately for further recommendations for treatment.   ITCHING:  If you experience itching with your medications, try taking only a single pain pill, or even half a pain pill at a time.  You can also use Benadryl over the counter for itching or also to help with sleep.   TED HOSE STOCKINGS:  Use stockings on both legs until for at least 2 weeks or as directed by physician office. They may be removed at night for sleeping.  MEDICATIONS:  See your medication summary on the "After Visit Summary" that nursing will review with you.  You may have some home medications which will be placed on hold until you complete the course of blood thinner medication.  It is important for you to complete the blood thinner medication as prescribed.  PRECAUTIONS:  If you experience chest pain or shortness of breath - call 911 immediately for transfer to the hospital emergency department.   If you develop a fever greater that 101 F, purulent drainage from wound, increased redness or drainage from wound, foul odor from the wound/dressing, or calf pain - CONTACT YOUR SURGEON.                                                   FOLLOW-UP APPOINTMENTS:  If you do not already have a post-op appointment, please call the office for an appointment to be seen by your surgeon.  Guidelines for how soon to be seen are listed in your "After Visit Summary", but are typically between 1-4 weeks after surgery.  OTHER INSTRUCTIONS:   Knee Replacement:  Do not place pillow under knee, focus on keeping the knee straight while resting. CPM instructions: 0-90 degrees, 2 hours in the morning, 2 hours in the afternoon, and 2 hours in the evening. Place foam block, curve side up under heel at all times except when in CPM or when walking.  DO NOT modify, tear, cut, or change the foam block  in any way.  POST-OPERATIVE OPIOID TAPER INSTRUCTIONS: It is important to wean off of your opioid medication as soon as possible. If you do not need pain medication after your surgery it is ok to stop day one. Opioids include: Codeine, Hydrocodone(Norco, Vicodin), Oxycodone(Percocet, oxycontin) and hydromorphone amongst others.  Long term and even short term use of opiods can cause: Increased pain response Dependence Constipation Depression Respiratory depression And more.  Withdrawal symptoms can include Flu like symptoms Nausea, vomiting And more Techniques to manage these symptoms Hydrate well Eat regular healthy meals Stay active Use relaxation techniques(deep breathing, meditating, yoga) Do Not substitute Alcohol to help with tapering If you have been on opioids for less than two weeks and do not have pain than it is ok to stop all together.  Plan to wean off of opioids This plan should start within one week post op of your joint replacement. Maintain the same interval or time between taking each dose and first decrease the dose.  Cut the total daily intake of opioids by one tablet each day Next start to increase the time between doses. The last dose that should be eliminated is the evening dose.     MAKE SURE YOU:  Understand these instructions.  Get help right away if you are not doing well or get worse.    Thank you for letting us be a part of your medical care team.  It is a privilege we  respect greatly.  We hope these instructions will help you stay on track for a fast and full recovery!   Increase activity slowly as tolerated   Complete by: As directed    Post-operative opioid taper instructions:   Complete by: As directed    POST-OPERATIVE OPIOID TAPER INSTRUCTIONS: It is important to wean off of your opioid medication as soon as possible. If you do not need pain medication after your surgery it is ok to stop day one. Opioids include: Codeine,  Hydrocodone(Norco, Vicodin), Oxycodone(Percocet, oxycontin) and hydromorphone amongst others.  Long term and even short term use of opiods can cause: Increased pain response Dependence Constipation Depression Respiratory depression And more.  Withdrawal symptoms can include Flu like symptoms Nausea, vomiting And more Techniques to manage these symptoms Hydrate well Eat regular healthy meals Stay active Use relaxation techniques(deep breathing, meditating, yoga) Do Not substitute Alcohol to help with tapering If you have been on opioids for less than two weeks and do not have pain than it is ok to stop all together.  Plan to wean off of opioids This plan should start within one week post op of your joint replacement. Maintain the same interval or time between taking each dose and first decrease the dose.  Cut the total daily intake of opioids by one tablet each day Next start to increase the time between doses. The last dose that should be eliminated is the evening dose.           Follow-up Information     Marcene Corning, MD. Go on 05/30/2023.   Specialty: Orthopedic Surgery Why: Your appointment is scheduled for 9:00 Contact information: [address] [place] [address] [phone]         [facility] Go on 05/23/2023.   Why: Your outpatient physical therapy is scheduled for 1:20. Please arrive at 1:00 to complete your paperwork Contact information: Physical Therapy [address] [place] [address] [phone]                  Signed: Ginger Organ Barbee Mamula 05/21/2023, 8:45 AM

## 2023-05-21 NOTE — Progress Notes (Signed)
   05/21/23 0000  BiPAP/CPAP/SIPAP  $ Non-Invasive Home Ventilator  Initial  BiPAP/CPAP/SIPAP Pt Type Adult  BiPAP/CPAP/SIPAP DREAMSTATIOND  Mask Type Full face mask  Respiratory Rate 18 breaths/min  Patient Home Equipment No  Auto Titrate Yes (5-20)

## 2023-05-21 NOTE — Care Management Obs Status (Signed)
MEDICARE OBSERVATION STATUS NOTIFICATION   Patient Details  Name: Jack [name] MRN: [id number] Date of Birth: 10/22/1945   Medicare Observation Status Notification Given:       Howell Rucks, RN 05/21/2023, 10:35 AM

## 2023-05-21 NOTE — Progress Notes (Signed)
Physical Therapy Treatment Patient Details Name: Jack [name] MRN: [id number] DOB: Dec 31, 1945 Today's Date: 05/21/2023   History of Present Illness 78 yo male presents to therapy s/p R TKA on 05/20/2023 due to failure of conservative measures. Pt PMH includes but is not limited to: A-fib s/p cardioversion asthma, SOB, DM II, HLD, gout, OSA, HTN, hypertrophic obstructive cardiomyopathy, CAD s/p cath, dCHF and spinal surgery.    PT Comments  Pt ambulated 130' with RW, no loss of balance Stair training completed. Pt demonstrates good understanding of HEP. He is ready to DC home from a PT standpoint.    If plan is discharge home, recommend the following: A little help with walking and/or transfers;A little help with bathing/dressing/bathroom;Assistance with cooking/housework;Assist for transportation;Help with stairs or ramp for entrance   Can travel by private vehicle        Equipment Recommendations  Rolling walker (2 wheels) (his RW at home is quite old)    Recommendations for Other Services       Precautions / Restrictions Precautions Precautions: Fall;Knee Precaution Booklet Issued: Yes (comment) Precaution Comments: reviewed no pillow under knee Restrictions Weight Bearing Restrictions Per Provider Order: No RLE Weight Bearing Per Provider Order: Weight bearing as tolerated     Mobility  Bed Mobility Overal bed mobility: Modified Independent Bed Mobility: Supine to Sit     Supine to sit: HOB elevated, Used rails, Modified independent (Device/Increase time)     General bed mobility comments: gait belt used as leg lifter    Transfers Overall transfer level: Needs assistance Equipment used: Rolling walker (2 wheels) Transfers: Sit to/from Stand Sit to Stand: Contact guard assist, From elevated surface           General transfer comment: verbal cues for hand placement    Ambulation/Gait Ambulation/Gait assistance: Contact guard assist Gait Distance (Feet):  130 Feet Assistive device: Rolling walker (2 wheels) Gait Pattern/deviations: Step-to pattern, Antalgic, Trunk flexed Gait velocity: decreased     General Gait Details: steady, no loss of balance   Stairs Stairs: Yes Stairs assistance: Min assist Stair Management: No rails, Backwards, Step to pattern, With walker Number of Stairs: 2 General stair comments: VCs sequencing, min A to steady RW, wife present   Wheelchair Mobility     Tilt Bed    Modified Rankin (Stroke Patients Only)       Balance Overall balance assessment: Needs assistance Sitting-balance support: Feet supported Sitting balance-Leahy Scale: Good     Standing balance support: Bilateral upper extremity supported, During functional activity, Reliant on assistive device for balance Standing balance-Leahy Scale: Fair                              Cognition Arousal: Alert Behavior During Therapy: WFL for tasks assessed/performed Overall Cognitive Status: Within Functional Limits for tasks assessed                                          Exercises Total Joint Exercises Ankle Circles/Pumps: AROM, Both, 10 reps, Supine Quad Sets: AROM, Both, 5 reps, Supine Short Arc Quad: AROM, Right, 5 reps, Supine Heel Slides: AAROM, Right, 5 reps, Supine Hip ABduction/ADduction: AAROM, Right, 5 reps, Supine Straight Leg Raises: AAROM, Right, 5 reps, Supine Long Arc Quad: AROM, Right, 5 reps, Seated Knee Flexion: AAROM, Right, 10 reps, Seated Goniometric ROM: 5-65* AAROM  R knee    General Comments        Pertinent Vitals/Pain Pain Assessment Pain Score: 6  Pain Location: R posterior knee Pain Descriptors / Indicators: Aching, Constant, Discomfort, Dull, Grimacing, Operative site guarding, Sore Pain Intervention(s): Limited activity within patient's tolerance, Monitored during session, Premedicated before session, Ice applied    Home Living                           Prior Function            PT Goals (current goals can now be found in the care plan section) Acute Rehab PT Goals Patient Stated Goal: to be able to amb on uneven surfaces, pittle in the garage and ride the tractor, be outside PT Goal Formulation: With patient Time For Goal Achievement: 06/03/23 Potential to Achieve Goals: Good Progress towards PT goals: Progressing toward goals    Frequency    7X/week      PT Plan      Co-evaluation              AM-PAC PT "6 Clicks" Mobility   Outcome Measure  Help needed turning from your back to your side while in a flat bed without using bedrails?: None Help needed moving from lying on your back to sitting on the side of a flat bed without using bedrails?: A Little Help needed moving to and from a bed to a chair (including a wheelchair)?: None Help needed standing up from a chair using your arms (e.g., wheelchair or bedside chair)?: None Help needed to walk in hospital room?: None Help needed climbing 3-5 steps with a railing? : A Little 6 Click Score: 22    End of Session Equipment Utilized During Treatment: Gait belt Activity Tolerance: Patient tolerated treatment well Patient left: in chair;with call bell/phone within reach;with family/visitor present Nurse Communication: Mobility status PT Visit Diagnosis: Unsteadiness on feet (R26.81);Other abnormalities of gait and mobility (R26.89);Muscle weakness (generalized) (M62.81);Difficulty in walking, not elsewhere classified (R26.2);Pain Pain - Right/Left: Right Pain - part of body: Knee     Time: 0901-0940 PT Time Calculation (min) (ACUTE ONLY): 39 min  Charges:    $Gait Training: 8-22 mins $Therapeutic Exercise: 8-22 mins $Therapeutic Activity: 8-22 mins PT General Charges $$ ACUTE PT VISIT: 1 Visit                    Tamala Ser PT 05/21/2023  Acute Rehabilitation Services  Office [phone]

## 2023-05-21 NOTE — TOC Transition Note (Signed)
Transition of Care [facility]) - Discharge Note   Patient Details  Name: Jack [name] MRN: [id number] Date of Birth: [birth year]  Transition of Care [facility]) CM/SW Contact:  Howell Rucks, RN Phone Number: 05/21/2023, 10:56 AM   Clinical Narrative:   Met with pt at bedside to review dc follow up therapy and DME needs, pt confirmed OPPT at [facility], RW provided to patient at bedside by [employer]. No TOC needs    Final next level of care: OP Rehab Barriers to Discharge: No Barriers Identified   Patient Goals and CMS Choice Patient states their goals for this hospitalization and ongoing recovery are:: return home          Discharge Placement                       Discharge Plan and Services Additional resources added to the After Visit Summary for                                       Social Drivers of Health (SDOH) Interventions SDOH Screenings   Food Insecurity: No Food Insecurity (05/20/2023)  Housing: Low Risk  (05/20/2023)  Transportation Needs: No Transportation Needs (05/20/2023)  Utilities: Not At Risk (05/20/2023)  Alcohol Screen: Low Risk  (05/14/2023)  Depression (PHQ2-9): Low Risk  (05/14/2023)  Financial Resource Strain: Low Risk  (05/14/2023)  Physical Activity: Insufficiently Active (05/14/2023)  Social Connections: Socially Integrated (05/20/2023)  Stress: No Stress Concern Present (05/14/2023)  Tobacco Use: Medium Risk (05/20/2023)  Health Literacy: Adequate Health Literacy (05/14/2023)     Readmission Risk Interventions     No data to display

## 2023-05-24 DIAGNOSIS — I872 Venous insufficiency (chronic) (peripheral): Secondary | ICD-10-CM | POA: Diagnosis not present

## 2023-05-24 DIAGNOSIS — E785 Hyperlipidemia, unspecified: Secondary | ICD-10-CM | POA: Diagnosis not present

## 2023-05-24 DIAGNOSIS — I2510 Atherosclerotic heart disease of native coronary artery without angina pectoris: Secondary | ICD-10-CM | POA: Diagnosis not present

## 2023-05-24 DIAGNOSIS — M47896 Other spondylosis, lumbar region: Secondary | ICD-10-CM | POA: Diagnosis not present

## 2023-05-24 DIAGNOSIS — I4891 Unspecified atrial fibrillation: Secondary | ICD-10-CM | POA: Diagnosis not present

## 2023-05-24 DIAGNOSIS — Z471 Aftercare following joint replacement surgery: Secondary | ICD-10-CM | POA: Diagnosis not present

## 2023-05-24 DIAGNOSIS — M109 Gout, unspecified: Secondary | ICD-10-CM | POA: Diagnosis not present

## 2023-05-24 DIAGNOSIS — Z556 Problems related to health literacy: Secondary | ICD-10-CM | POA: Diagnosis not present

## 2023-05-24 DIAGNOSIS — J4540 Moderate persistent asthma, uncomplicated: Secondary | ICD-10-CM | POA: Diagnosis not present

## 2023-05-24 DIAGNOSIS — Z7901 Long term (current) use of anticoagulants: Secondary | ICD-10-CM | POA: Diagnosis not present

## 2023-05-24 DIAGNOSIS — I5032 Chronic diastolic (congestive) heart failure: Secondary | ICD-10-CM | POA: Diagnosis not present

## 2023-05-24 DIAGNOSIS — I110 Hypertensive heart disease with heart failure: Secondary | ICD-10-CM | POA: Diagnosis not present

## 2023-05-24 DIAGNOSIS — M5430 Sciatica, unspecified side: Secondary | ICD-10-CM | POA: Diagnosis not present

## 2023-05-24 DIAGNOSIS — Z96653 Presence of artificial knee joint, bilateral: Secondary | ICD-10-CM | POA: Diagnosis not present

## 2023-05-24 DIAGNOSIS — E1151 Type 2 diabetes mellitus with diabetic peripheral angiopathy without gangrene: Secondary | ICD-10-CM | POA: Diagnosis not present

## 2023-05-26 DIAGNOSIS — Z96653 Presence of artificial knee joint, bilateral: Secondary | ICD-10-CM | POA: Diagnosis not present

## 2023-05-26 DIAGNOSIS — I5032 Chronic diastolic (congestive) heart failure: Secondary | ICD-10-CM | POA: Diagnosis not present

## 2023-05-26 DIAGNOSIS — E1151 Type 2 diabetes mellitus with diabetic peripheral angiopathy without gangrene: Secondary | ICD-10-CM | POA: Diagnosis not present

## 2023-05-26 DIAGNOSIS — I4891 Unspecified atrial fibrillation: Secondary | ICD-10-CM | POA: Diagnosis not present

## 2023-05-26 DIAGNOSIS — I2510 Atherosclerotic heart disease of native coronary artery without angina pectoris: Secondary | ICD-10-CM | POA: Diagnosis not present

## 2023-05-26 DIAGNOSIS — Z556 Problems related to health literacy: Secondary | ICD-10-CM | POA: Diagnosis not present

## 2023-05-26 DIAGNOSIS — J4540 Moderate persistent asthma, uncomplicated: Secondary | ICD-10-CM | POA: Diagnosis not present

## 2023-05-26 DIAGNOSIS — M109 Gout, unspecified: Secondary | ICD-10-CM | POA: Diagnosis not present

## 2023-05-26 DIAGNOSIS — I110 Hypertensive heart disease with heart failure: Secondary | ICD-10-CM | POA: Diagnosis not present

## 2023-05-26 DIAGNOSIS — I872 Venous insufficiency (chronic) (peripheral): Secondary | ICD-10-CM | POA: Diagnosis not present

## 2023-05-26 DIAGNOSIS — E785 Hyperlipidemia, unspecified: Secondary | ICD-10-CM | POA: Diagnosis not present

## 2023-05-26 DIAGNOSIS — Z7901 Long term (current) use of anticoagulants: Secondary | ICD-10-CM | POA: Diagnosis not present

## 2023-05-26 DIAGNOSIS — M47896 Other spondylosis, lumbar region: Secondary | ICD-10-CM | POA: Diagnosis not present

## 2023-05-26 DIAGNOSIS — M5430 Sciatica, unspecified side: Secondary | ICD-10-CM | POA: Diagnosis not present

## 2023-05-26 DIAGNOSIS — Z471 Aftercare following joint replacement surgery: Secondary | ICD-10-CM | POA: Diagnosis not present

## 2023-05-28 DIAGNOSIS — J4540 Moderate persistent asthma, uncomplicated: Secondary | ICD-10-CM | POA: Diagnosis not present

## 2023-05-28 DIAGNOSIS — I2510 Atherosclerotic heart disease of native coronary artery without angina pectoris: Secondary | ICD-10-CM | POA: Diagnosis not present

## 2023-05-28 DIAGNOSIS — M47896 Other spondylosis, lumbar region: Secondary | ICD-10-CM | POA: Diagnosis not present

## 2023-05-28 DIAGNOSIS — Z96653 Presence of artificial knee joint, bilateral: Secondary | ICD-10-CM | POA: Diagnosis not present

## 2023-05-28 DIAGNOSIS — I4891 Unspecified atrial fibrillation: Secondary | ICD-10-CM | POA: Diagnosis not present

## 2023-05-28 DIAGNOSIS — E1151 Type 2 diabetes mellitus with diabetic peripheral angiopathy without gangrene: Secondary | ICD-10-CM | POA: Diagnosis not present

## 2023-05-28 DIAGNOSIS — I5032 Chronic diastolic (congestive) heart failure: Secondary | ICD-10-CM | POA: Diagnosis not present

## 2023-05-28 DIAGNOSIS — Z556 Problems related to health literacy: Secondary | ICD-10-CM | POA: Diagnosis not present

## 2023-05-28 DIAGNOSIS — Z471 Aftercare following joint replacement surgery: Secondary | ICD-10-CM | POA: Diagnosis not present

## 2023-05-28 DIAGNOSIS — I110 Hypertensive heart disease with heart failure: Secondary | ICD-10-CM | POA: Diagnosis not present

## 2023-05-28 DIAGNOSIS — M5430 Sciatica, unspecified side: Secondary | ICD-10-CM | POA: Diagnosis not present

## 2023-05-28 DIAGNOSIS — M109 Gout, unspecified: Secondary | ICD-10-CM | POA: Diagnosis not present

## 2023-05-28 DIAGNOSIS — I872 Venous insufficiency (chronic) (peripheral): Secondary | ICD-10-CM | POA: Diagnosis not present

## 2023-05-28 DIAGNOSIS — Z7901 Long term (current) use of anticoagulants: Secondary | ICD-10-CM | POA: Diagnosis not present

## 2023-05-28 DIAGNOSIS — E785 Hyperlipidemia, unspecified: Secondary | ICD-10-CM | POA: Diagnosis not present

## 2023-05-29 DIAGNOSIS — I872 Venous insufficiency (chronic) (peripheral): Secondary | ICD-10-CM | POA: Diagnosis not present

## 2023-05-29 DIAGNOSIS — M5430 Sciatica, unspecified side: Secondary | ICD-10-CM | POA: Diagnosis not present

## 2023-05-29 DIAGNOSIS — Z471 Aftercare following joint replacement surgery: Secondary | ICD-10-CM | POA: Diagnosis not present

## 2023-05-29 DIAGNOSIS — E1151 Type 2 diabetes mellitus with diabetic peripheral angiopathy without gangrene: Secondary | ICD-10-CM | POA: Diagnosis not present

## 2023-05-29 DIAGNOSIS — I110 Hypertensive heart disease with heart failure: Secondary | ICD-10-CM | POA: Diagnosis not present

## 2023-05-29 DIAGNOSIS — I4891 Unspecified atrial fibrillation: Secondary | ICD-10-CM | POA: Diagnosis not present

## 2023-05-29 DIAGNOSIS — I2510 Atherosclerotic heart disease of native coronary artery without angina pectoris: Secondary | ICD-10-CM | POA: Diagnosis not present

## 2023-05-29 DIAGNOSIS — Z96653 Presence of artificial knee joint, bilateral: Secondary | ICD-10-CM | POA: Diagnosis not present

## 2023-05-29 DIAGNOSIS — M109 Gout, unspecified: Secondary | ICD-10-CM | POA: Diagnosis not present

## 2023-05-29 DIAGNOSIS — M47896 Other spondylosis, lumbar region: Secondary | ICD-10-CM | POA: Diagnosis not present

## 2023-05-29 DIAGNOSIS — I5032 Chronic diastolic (congestive) heart failure: Secondary | ICD-10-CM | POA: Diagnosis not present

## 2023-05-29 DIAGNOSIS — Z7901 Long term (current) use of anticoagulants: Secondary | ICD-10-CM | POA: Diagnosis not present

## 2023-05-29 DIAGNOSIS — Z556 Problems related to health literacy: Secondary | ICD-10-CM | POA: Diagnosis not present

## 2023-05-29 DIAGNOSIS — E785 Hyperlipidemia, unspecified: Secondary | ICD-10-CM | POA: Diagnosis not present

## 2023-05-29 DIAGNOSIS — J4540 Moderate persistent asthma, uncomplicated: Secondary | ICD-10-CM | POA: Diagnosis not present

## 2023-05-30 DIAGNOSIS — M1711 Unilateral primary osteoarthritis, right knee: Secondary | ICD-10-CM | POA: Diagnosis not present

## 2023-06-03 DIAGNOSIS — Z96653 Presence of artificial knee joint, bilateral: Secondary | ICD-10-CM | POA: Diagnosis not present

## 2023-06-03 DIAGNOSIS — I872 Venous insufficiency (chronic) (peripheral): Secondary | ICD-10-CM | POA: Diagnosis not present

## 2023-06-03 DIAGNOSIS — Z7901 Long term (current) use of anticoagulants: Secondary | ICD-10-CM | POA: Diagnosis not present

## 2023-06-03 DIAGNOSIS — M5430 Sciatica, unspecified side: Secondary | ICD-10-CM | POA: Diagnosis not present

## 2023-06-03 DIAGNOSIS — M47896 Other spondylosis, lumbar region: Secondary | ICD-10-CM | POA: Diagnosis not present

## 2023-06-03 DIAGNOSIS — J4540 Moderate persistent asthma, uncomplicated: Secondary | ICD-10-CM | POA: Diagnosis not present

## 2023-06-03 DIAGNOSIS — Z471 Aftercare following joint replacement surgery: Secondary | ICD-10-CM | POA: Diagnosis not present

## 2023-06-03 DIAGNOSIS — I5032 Chronic diastolic (congestive) heart failure: Secondary | ICD-10-CM | POA: Diagnosis not present

## 2023-06-03 DIAGNOSIS — I4891 Unspecified atrial fibrillation: Secondary | ICD-10-CM | POA: Diagnosis not present

## 2023-06-03 DIAGNOSIS — I2510 Atherosclerotic heart disease of native coronary artery without angina pectoris: Secondary | ICD-10-CM | POA: Diagnosis not present

## 2023-06-03 DIAGNOSIS — Z556 Problems related to health literacy: Secondary | ICD-10-CM | POA: Diagnosis not present

## 2023-06-03 DIAGNOSIS — M109 Gout, unspecified: Secondary | ICD-10-CM | POA: Diagnosis not present

## 2023-06-03 DIAGNOSIS — I110 Hypertensive heart disease with heart failure: Secondary | ICD-10-CM | POA: Diagnosis not present

## 2023-06-03 DIAGNOSIS — E1151 Type 2 diabetes mellitus with diabetic peripheral angiopathy without gangrene: Secondary | ICD-10-CM | POA: Diagnosis not present

## 2023-06-03 DIAGNOSIS — E785 Hyperlipidemia, unspecified: Secondary | ICD-10-CM | POA: Diagnosis not present

## 2023-06-04 DIAGNOSIS — Z7901 Long term (current) use of anticoagulants: Secondary | ICD-10-CM | POA: Diagnosis not present

## 2023-06-04 DIAGNOSIS — Z471 Aftercare following joint replacement surgery: Secondary | ICD-10-CM | POA: Diagnosis not present

## 2023-06-04 DIAGNOSIS — M47896 Other spondylosis, lumbar region: Secondary | ICD-10-CM | POA: Diagnosis not present

## 2023-06-04 DIAGNOSIS — Z96653 Presence of artificial knee joint, bilateral: Secondary | ICD-10-CM | POA: Diagnosis not present

## 2023-06-04 DIAGNOSIS — E1151 Type 2 diabetes mellitus with diabetic peripheral angiopathy without gangrene: Secondary | ICD-10-CM | POA: Diagnosis not present

## 2023-06-04 DIAGNOSIS — J4540 Moderate persistent asthma, uncomplicated: Secondary | ICD-10-CM | POA: Diagnosis not present

## 2023-06-04 DIAGNOSIS — E785 Hyperlipidemia, unspecified: Secondary | ICD-10-CM | POA: Diagnosis not present

## 2023-06-04 DIAGNOSIS — I2510 Atherosclerotic heart disease of native coronary artery without angina pectoris: Secondary | ICD-10-CM | POA: Diagnosis not present

## 2023-06-04 DIAGNOSIS — I110 Hypertensive heart disease with heart failure: Secondary | ICD-10-CM | POA: Diagnosis not present

## 2023-06-04 DIAGNOSIS — M109 Gout, unspecified: Secondary | ICD-10-CM | POA: Diagnosis not present

## 2023-06-04 DIAGNOSIS — Z556 Problems related to health literacy: Secondary | ICD-10-CM | POA: Diagnosis not present

## 2023-06-04 DIAGNOSIS — I4891 Unspecified atrial fibrillation: Secondary | ICD-10-CM | POA: Diagnosis not present

## 2023-06-04 DIAGNOSIS — I872 Venous insufficiency (chronic) (peripheral): Secondary | ICD-10-CM | POA: Diagnosis not present

## 2023-06-04 DIAGNOSIS — M5430 Sciatica, unspecified side: Secondary | ICD-10-CM | POA: Diagnosis not present

## 2023-06-04 DIAGNOSIS — I5032 Chronic diastolic (congestive) heart failure: Secondary | ICD-10-CM | POA: Diagnosis not present

## 2023-06-10 DIAGNOSIS — M6281 Muscle weakness (generalized): Secondary | ICD-10-CM | POA: Diagnosis not present

## 2023-06-10 DIAGNOSIS — R2689 Other abnormalities of gait and mobility: Secondary | ICD-10-CM | POA: Diagnosis not present

## 2023-06-10 DIAGNOSIS — M25661 Stiffness of right knee, not elsewhere classified: Secondary | ICD-10-CM | POA: Diagnosis not present

## 2023-06-10 DIAGNOSIS — M25561 Pain in right knee: Secondary | ICD-10-CM | POA: Diagnosis not present

## 2023-06-10 DIAGNOSIS — Z96651 Presence of right artificial knee joint: Secondary | ICD-10-CM | POA: Diagnosis not present

## 2023-06-12 DIAGNOSIS — M25661 Stiffness of right knee, not elsewhere classified: Secondary | ICD-10-CM | POA: Diagnosis not present

## 2023-06-12 DIAGNOSIS — Z96651 Presence of right artificial knee joint: Secondary | ICD-10-CM | POA: Diagnosis not present

## 2023-06-12 DIAGNOSIS — R2689 Other abnormalities of gait and mobility: Secondary | ICD-10-CM | POA: Diagnosis not present

## 2023-06-12 DIAGNOSIS — M25561 Pain in right knee: Secondary | ICD-10-CM | POA: Diagnosis not present

## 2023-06-12 DIAGNOSIS — M6281 Muscle weakness (generalized): Secondary | ICD-10-CM | POA: Diagnosis not present

## 2023-06-15 ENCOUNTER — Other Ambulatory Visit: Payer: Self-pay | Admitting: Cardiology

## 2023-06-17 DIAGNOSIS — M6281 Muscle weakness (generalized): Secondary | ICD-10-CM | POA: Diagnosis not present

## 2023-06-17 DIAGNOSIS — R2689 Other abnormalities of gait and mobility: Secondary | ICD-10-CM | POA: Diagnosis not present

## 2023-06-17 DIAGNOSIS — Z96651 Presence of right artificial knee joint: Secondary | ICD-10-CM | POA: Diagnosis not present

## 2023-06-17 DIAGNOSIS — M25661 Stiffness of right knee, not elsewhere classified: Secondary | ICD-10-CM | POA: Diagnosis not present

## 2023-06-17 DIAGNOSIS — M25561 Pain in right knee: Secondary | ICD-10-CM | POA: Diagnosis not present

## 2023-06-20 DIAGNOSIS — Z96651 Presence of right artificial knee joint: Secondary | ICD-10-CM | POA: Diagnosis not present

## 2023-06-20 DIAGNOSIS — M25561 Pain in right knee: Secondary | ICD-10-CM | POA: Diagnosis not present

## 2023-06-20 DIAGNOSIS — M6281 Muscle weakness (generalized): Secondary | ICD-10-CM | POA: Diagnosis not present

## 2023-06-20 DIAGNOSIS — M25661 Stiffness of right knee, not elsewhere classified: Secondary | ICD-10-CM | POA: Diagnosis not present

## 2023-06-20 DIAGNOSIS — R2689 Other abnormalities of gait and mobility: Secondary | ICD-10-CM | POA: Diagnosis not present

## 2023-06-21 ENCOUNTER — Other Ambulatory Visit: Payer: Self-pay | Admitting: Cardiology

## 2023-06-21 DIAGNOSIS — I421 Obstructive hypertrophic cardiomyopathy: Secondary | ICD-10-CM

## 2023-06-21 DIAGNOSIS — I10 Essential (primary) hypertension: Secondary | ICD-10-CM

## 2023-06-24 DIAGNOSIS — Z96651 Presence of right artificial knee joint: Secondary | ICD-10-CM | POA: Diagnosis not present

## 2023-06-24 DIAGNOSIS — M25561 Pain in right knee: Secondary | ICD-10-CM | POA: Diagnosis not present

## 2023-06-24 DIAGNOSIS — M6281 Muscle weakness (generalized): Secondary | ICD-10-CM | POA: Diagnosis not present

## 2023-06-24 DIAGNOSIS — M25661 Stiffness of right knee, not elsewhere classified: Secondary | ICD-10-CM | POA: Diagnosis not present

## 2023-06-24 DIAGNOSIS — R2689 Other abnormalities of gait and mobility: Secondary | ICD-10-CM | POA: Diagnosis not present

## 2023-06-26 DIAGNOSIS — M25661 Stiffness of right knee, not elsewhere classified: Secondary | ICD-10-CM | POA: Diagnosis not present

## 2023-06-26 DIAGNOSIS — M6281 Muscle weakness (generalized): Secondary | ICD-10-CM | POA: Diagnosis not present

## 2023-06-26 DIAGNOSIS — Z96651 Presence of right artificial knee joint: Secondary | ICD-10-CM | POA: Diagnosis not present

## 2023-06-26 DIAGNOSIS — M25561 Pain in right knee: Secondary | ICD-10-CM | POA: Diagnosis not present

## 2023-06-26 DIAGNOSIS — R2689 Other abnormalities of gait and mobility: Secondary | ICD-10-CM | POA: Diagnosis not present

## 2023-07-01 DIAGNOSIS — Z96651 Presence of right artificial knee joint: Secondary | ICD-10-CM | POA: Diagnosis not present

## 2023-07-01 DIAGNOSIS — R2689 Other abnormalities of gait and mobility: Secondary | ICD-10-CM | POA: Diagnosis not present

## 2023-07-01 DIAGNOSIS — M25661 Stiffness of right knee, not elsewhere classified: Secondary | ICD-10-CM | POA: Diagnosis not present

## 2023-07-01 DIAGNOSIS — M25561 Pain in right knee: Secondary | ICD-10-CM | POA: Diagnosis not present

## 2023-07-01 DIAGNOSIS — M6281 Muscle weakness (generalized): Secondary | ICD-10-CM | POA: Diagnosis not present

## 2023-07-03 DIAGNOSIS — M25661 Stiffness of right knee, not elsewhere classified: Secondary | ICD-10-CM | POA: Diagnosis not present

## 2023-07-03 DIAGNOSIS — R2689 Other abnormalities of gait and mobility: Secondary | ICD-10-CM | POA: Diagnosis not present

## 2023-07-03 DIAGNOSIS — Z96651 Presence of right artificial knee joint: Secondary | ICD-10-CM | POA: Diagnosis not present

## 2023-07-03 DIAGNOSIS — M6281 Muscle weakness (generalized): Secondary | ICD-10-CM | POA: Diagnosis not present

## 2023-07-03 DIAGNOSIS — M25561 Pain in right knee: Secondary | ICD-10-CM | POA: Diagnosis not present

## 2023-07-08 DIAGNOSIS — M6281 Muscle weakness (generalized): Secondary | ICD-10-CM | POA: Diagnosis not present

## 2023-07-08 DIAGNOSIS — M25661 Stiffness of right knee, not elsewhere classified: Secondary | ICD-10-CM | POA: Diagnosis not present

## 2023-07-08 DIAGNOSIS — M25561 Pain in right knee: Secondary | ICD-10-CM | POA: Diagnosis not present

## 2023-07-08 DIAGNOSIS — Z96651 Presence of right artificial knee joint: Secondary | ICD-10-CM | POA: Diagnosis not present

## 2023-07-08 DIAGNOSIS — R2689 Other abnormalities of gait and mobility: Secondary | ICD-10-CM | POA: Diagnosis not present

## 2023-07-10 DIAGNOSIS — M25561 Pain in right knee: Secondary | ICD-10-CM | POA: Diagnosis not present

## 2023-07-10 DIAGNOSIS — Z96651 Presence of right artificial knee joint: Secondary | ICD-10-CM | POA: Diagnosis not present

## 2023-07-10 DIAGNOSIS — M6281 Muscle weakness (generalized): Secondary | ICD-10-CM | POA: Diagnosis not present

## 2023-07-10 DIAGNOSIS — R2689 Other abnormalities of gait and mobility: Secondary | ICD-10-CM | POA: Diagnosis not present

## 2023-07-10 DIAGNOSIS — M25661 Stiffness of right knee, not elsewhere classified: Secondary | ICD-10-CM | POA: Diagnosis not present

## 2023-07-12 ENCOUNTER — Other Ambulatory Visit: Payer: Self-pay | Admitting: Cardiology

## 2023-07-12 DIAGNOSIS — I4819 Other persistent atrial fibrillation: Secondary | ICD-10-CM

## 2023-07-14 ENCOUNTER — Ambulatory Visit (HOSPITAL_COMMUNITY)
Admission: RE | Admit: 2023-07-14 | Discharge: 2023-07-14 | Disposition: A | Discharge status: Home / Self Care | Source: Ambulatory Visit | Attending: Cardiology | Admitting: Cardiology

## 2023-07-14 ENCOUNTER — Other Ambulatory Visit (HOSPITAL_COMMUNITY): Payer: Self-pay | Admitting: Orthopaedic Surgery

## 2023-07-14 DIAGNOSIS — M79604 Pain in right leg: Secondary | ICD-10-CM | POA: Diagnosis not present

## 2023-07-14 NOTE — Telephone Encounter (Signed)
 Patient pharmacy is requesting refill on Amiodarone.   The instructions are as follows :   Summary: Take 1 tablet (200 mg total) by mouth as directed. 1 tab twice daily for 10 day then 1 tab daily   Only 45 tablets were dispensed. Does patient need to continue this medication? Can you send a 90 day supply with 3 RF, if he needs to continue it.

## 2023-07-15 DIAGNOSIS — R2689 Other abnormalities of gait and mobility: Secondary | ICD-10-CM | POA: Diagnosis not present

## 2023-07-15 DIAGNOSIS — M6281 Muscle weakness (generalized): Secondary | ICD-10-CM | POA: Diagnosis not present

## 2023-07-15 DIAGNOSIS — Z96651 Presence of right artificial knee joint: Secondary | ICD-10-CM | POA: Diagnosis not present

## 2023-07-15 DIAGNOSIS — M25661 Stiffness of right knee, not elsewhere classified: Secondary | ICD-10-CM | POA: Diagnosis not present

## 2023-07-15 DIAGNOSIS — M25561 Pain in right knee: Secondary | ICD-10-CM | POA: Diagnosis not present

## 2023-07-17 DIAGNOSIS — M25561 Pain in right knee: Secondary | ICD-10-CM | POA: Diagnosis not present

## 2023-07-17 DIAGNOSIS — M6281 Muscle weakness (generalized): Secondary | ICD-10-CM | POA: Diagnosis not present

## 2023-07-17 DIAGNOSIS — R2689 Other abnormalities of gait and mobility: Secondary | ICD-10-CM | POA: Diagnosis not present

## 2023-07-17 DIAGNOSIS — M25661 Stiffness of right knee, not elsewhere classified: Secondary | ICD-10-CM | POA: Diagnosis not present

## 2023-07-17 DIAGNOSIS — Z96651 Presence of right artificial knee joint: Secondary | ICD-10-CM | POA: Diagnosis not present

## 2023-07-22 DIAGNOSIS — M25661 Stiffness of right knee, not elsewhere classified: Secondary | ICD-10-CM | POA: Diagnosis not present

## 2023-07-22 DIAGNOSIS — Z96651 Presence of right artificial knee joint: Secondary | ICD-10-CM | POA: Diagnosis not present

## 2023-07-22 DIAGNOSIS — M25561 Pain in right knee: Secondary | ICD-10-CM | POA: Diagnosis not present

## 2023-07-22 DIAGNOSIS — R2689 Other abnormalities of gait and mobility: Secondary | ICD-10-CM | POA: Diagnosis not present

## 2023-07-22 DIAGNOSIS — M6281 Muscle weakness (generalized): Secondary | ICD-10-CM | POA: Diagnosis not present

## 2023-07-24 DIAGNOSIS — M25661 Stiffness of right knee, not elsewhere classified: Secondary | ICD-10-CM | POA: Diagnosis not present

## 2023-07-24 DIAGNOSIS — Z96651 Presence of right artificial knee joint: Secondary | ICD-10-CM | POA: Diagnosis not present

## 2023-07-24 DIAGNOSIS — M25561 Pain in right knee: Secondary | ICD-10-CM | POA: Diagnosis not present

## 2023-07-24 DIAGNOSIS — M6281 Muscle weakness (generalized): Secondary | ICD-10-CM | POA: Diagnosis not present

## 2023-07-24 DIAGNOSIS — R2689 Other abnormalities of gait and mobility: Secondary | ICD-10-CM | POA: Diagnosis not present

## 2023-07-29 DIAGNOSIS — R2689 Other abnormalities of gait and mobility: Secondary | ICD-10-CM | POA: Diagnosis not present

## 2023-07-29 DIAGNOSIS — M25661 Stiffness of right knee, not elsewhere classified: Secondary | ICD-10-CM | POA: Diagnosis not present

## 2023-07-29 DIAGNOSIS — M25561 Pain in right knee: Secondary | ICD-10-CM | POA: Diagnosis not present

## 2023-07-29 DIAGNOSIS — M6281 Muscle weakness (generalized): Secondary | ICD-10-CM | POA: Diagnosis not present

## 2023-07-29 DIAGNOSIS — Z96651 Presence of right artificial knee joint: Secondary | ICD-10-CM | POA: Diagnosis not present

## 2023-07-31 DIAGNOSIS — M25561 Pain in right knee: Secondary | ICD-10-CM | POA: Diagnosis not present

## 2023-07-31 DIAGNOSIS — M6281 Muscle weakness (generalized): Secondary | ICD-10-CM | POA: Diagnosis not present

## 2023-07-31 DIAGNOSIS — R2689 Other abnormalities of gait and mobility: Secondary | ICD-10-CM | POA: Diagnosis not present

## 2023-07-31 DIAGNOSIS — M25661 Stiffness of right knee, not elsewhere classified: Secondary | ICD-10-CM | POA: Diagnosis not present

## 2023-07-31 DIAGNOSIS — Z96651 Presence of right artificial knee joint: Secondary | ICD-10-CM | POA: Diagnosis not present

## 2023-08-05 DIAGNOSIS — Z96651 Presence of right artificial knee joint: Secondary | ICD-10-CM | POA: Diagnosis not present

## 2023-08-05 DIAGNOSIS — M25561 Pain in right knee: Secondary | ICD-10-CM | POA: Diagnosis not present

## 2023-08-05 DIAGNOSIS — M6281 Muscle weakness (generalized): Secondary | ICD-10-CM | POA: Diagnosis not present

## 2023-08-05 DIAGNOSIS — M25661 Stiffness of right knee, not elsewhere classified: Secondary | ICD-10-CM | POA: Diagnosis not present

## 2023-08-05 DIAGNOSIS — R2689 Other abnormalities of gait and mobility: Secondary | ICD-10-CM | POA: Diagnosis not present

## 2023-08-07 DIAGNOSIS — M25561 Pain in right knee: Secondary | ICD-10-CM | POA: Diagnosis not present

## 2023-08-07 DIAGNOSIS — M25661 Stiffness of right knee, not elsewhere classified: Secondary | ICD-10-CM | POA: Diagnosis not present

## 2023-08-07 DIAGNOSIS — Z96651 Presence of right artificial knee joint: Secondary | ICD-10-CM | POA: Diagnosis not present

## 2023-08-07 DIAGNOSIS — R2689 Other abnormalities of gait and mobility: Secondary | ICD-10-CM | POA: Diagnosis not present

## 2023-08-07 DIAGNOSIS — M6281 Muscle weakness (generalized): Secondary | ICD-10-CM | POA: Diagnosis not present

## 2023-08-12 DIAGNOSIS — R2689 Other abnormalities of gait and mobility: Secondary | ICD-10-CM | POA: Diagnosis not present

## 2023-08-12 DIAGNOSIS — M25661 Stiffness of right knee, not elsewhere classified: Secondary | ICD-10-CM | POA: Diagnosis not present

## 2023-08-12 DIAGNOSIS — M25561 Pain in right knee: Secondary | ICD-10-CM | POA: Diagnosis not present

## 2023-08-12 DIAGNOSIS — M6281 Muscle weakness (generalized): Secondary | ICD-10-CM | POA: Diagnosis not present

## 2023-08-12 DIAGNOSIS — Z96651 Presence of right artificial knee joint: Secondary | ICD-10-CM | POA: Diagnosis not present

## 2023-08-18 DIAGNOSIS — M25561 Pain in right knee: Secondary | ICD-10-CM | POA: Diagnosis not present

## 2023-08-19 DIAGNOSIS — R2689 Other abnormalities of gait and mobility: Secondary | ICD-10-CM | POA: Diagnosis not present

## 2023-08-19 DIAGNOSIS — M6281 Muscle weakness (generalized): Secondary | ICD-10-CM | POA: Diagnosis not present

## 2023-08-19 DIAGNOSIS — M25561 Pain in right knee: Secondary | ICD-10-CM | POA: Diagnosis not present

## 2023-08-19 DIAGNOSIS — M25661 Stiffness of right knee, not elsewhere classified: Secondary | ICD-10-CM | POA: Diagnosis not present

## 2023-08-19 DIAGNOSIS — Z96651 Presence of right artificial knee joint: Secondary | ICD-10-CM | POA: Diagnosis not present

## 2023-08-21 ENCOUNTER — Other Ambulatory Visit: Payer: Self-pay

## 2023-08-21 DIAGNOSIS — R2689 Other abnormalities of gait and mobility: Secondary | ICD-10-CM | POA: Diagnosis not present

## 2023-08-21 DIAGNOSIS — M25561 Pain in right knee: Secondary | ICD-10-CM | POA: Diagnosis not present

## 2023-08-21 DIAGNOSIS — M25661 Stiffness of right knee, not elsewhere classified: Secondary | ICD-10-CM | POA: Diagnosis not present

## 2023-08-21 DIAGNOSIS — Z96651 Presence of right artificial knee joint: Secondary | ICD-10-CM | POA: Diagnosis not present

## 2023-08-21 DIAGNOSIS — M6281 Muscle weakness (generalized): Secondary | ICD-10-CM | POA: Diagnosis not present

## 2023-08-21 MED ORDER — APIXABAN 5 MG PO TABS: 5.0000 mg | ORAL_TABLET | Freq: Two times a day (BID) | ORAL | 6 refills | Status: AC

## 2023-08-24 ENCOUNTER — Other Ambulatory Visit: Payer: Self-pay | Admitting: Cardiology

## 2023-08-26 DIAGNOSIS — M25661 Stiffness of right knee, not elsewhere classified: Secondary | ICD-10-CM | POA: Diagnosis not present

## 2023-08-26 DIAGNOSIS — R2689 Other abnormalities of gait and mobility: Secondary | ICD-10-CM | POA: Diagnosis not present

## 2023-08-26 DIAGNOSIS — M25561 Pain in right knee: Secondary | ICD-10-CM | POA: Diagnosis not present

## 2023-08-26 DIAGNOSIS — M6281 Muscle weakness (generalized): Secondary | ICD-10-CM | POA: Diagnosis not present

## 2023-08-26 DIAGNOSIS — Z96651 Presence of right artificial knee joint: Secondary | ICD-10-CM | POA: Diagnosis not present

## 2023-08-28 DIAGNOSIS — M25661 Stiffness of right knee, not elsewhere classified: Secondary | ICD-10-CM | POA: Diagnosis not present

## 2023-08-28 DIAGNOSIS — Z96651 Presence of right artificial knee joint: Secondary | ICD-10-CM | POA: Diagnosis not present

## 2023-08-28 DIAGNOSIS — R2689 Other abnormalities of gait and mobility: Secondary | ICD-10-CM | POA: Diagnosis not present

## 2023-08-28 DIAGNOSIS — M25561 Pain in right knee: Secondary | ICD-10-CM | POA: Diagnosis not present

## 2023-08-28 DIAGNOSIS — M6281 Muscle weakness (generalized): Secondary | ICD-10-CM | POA: Diagnosis not present

## 2023-09-02 DIAGNOSIS — M25561 Pain in right knee: Secondary | ICD-10-CM | POA: Diagnosis not present

## 2023-09-02 DIAGNOSIS — M6281 Muscle weakness (generalized): Secondary | ICD-10-CM | POA: Diagnosis not present

## 2023-09-02 DIAGNOSIS — M25661 Stiffness of right knee, not elsewhere classified: Secondary | ICD-10-CM | POA: Diagnosis not present

## 2023-09-02 DIAGNOSIS — Z96651 Presence of right artificial knee joint: Secondary | ICD-10-CM | POA: Diagnosis not present

## 2023-09-02 DIAGNOSIS — R2689 Other abnormalities of gait and mobility: Secondary | ICD-10-CM | POA: Diagnosis not present

## 2023-09-04 DIAGNOSIS — M25661 Stiffness of right knee, not elsewhere classified: Secondary | ICD-10-CM | POA: Diagnosis not present

## 2023-09-04 DIAGNOSIS — M6281 Muscle weakness (generalized): Secondary | ICD-10-CM | POA: Diagnosis not present

## 2023-09-04 DIAGNOSIS — R2689 Other abnormalities of gait and mobility: Secondary | ICD-10-CM | POA: Diagnosis not present

## 2023-09-04 DIAGNOSIS — M25561 Pain in right knee: Secondary | ICD-10-CM | POA: Diagnosis not present

## 2023-09-04 DIAGNOSIS — Z96651 Presence of right artificial knee joint: Secondary | ICD-10-CM | POA: Diagnosis not present

## 2023-09-08 DIAGNOSIS — H353111 Nonexudative age-related macular degeneration, right eye, early dry stage: Secondary | ICD-10-CM | POA: Diagnosis not present

## 2023-09-08 DIAGNOSIS — H524 Presbyopia: Secondary | ICD-10-CM | POA: Diagnosis not present

## 2023-09-08 DIAGNOSIS — E113292 Type 2 diabetes mellitus with mild nonproliferative diabetic retinopathy without macular edema, left eye: Secondary | ICD-10-CM | POA: Diagnosis not present

## 2023-09-08 LAB — HM DIABETES EYE EXAM

## 2023-10-10 ENCOUNTER — Telehealth: Payer: Self-pay | Admitting: Family Medicine

## 2023-10-10 ENCOUNTER — Ambulatory Visit: Payer: Medicare Other | Attending: Cardiology | Admitting: Cardiology

## 2023-10-10 ENCOUNTER — Encounter: Payer: Self-pay | Admitting: Cardiology

## 2023-10-10 VITALS — BP 128/68 | HR 56 | Resp 16 | Ht 67.0 in | Wt 254.2 lb

## 2023-10-10 DIAGNOSIS — I421 Obstructive hypertrophic cardiomyopathy: Secondary | ICD-10-CM

## 2023-10-10 DIAGNOSIS — E119 Type 2 diabetes mellitus without complications: Secondary | ICD-10-CM

## 2023-10-10 DIAGNOSIS — I480 Paroxysmal atrial fibrillation: Secondary | ICD-10-CM | POA: Diagnosis not present

## 2023-10-10 DIAGNOSIS — I10 Essential (primary) hypertension: Secondary | ICD-10-CM

## 2023-10-10 MED ORDER — TIRZEPATIDE 2.5 MG/0.5ML ~~LOC~~ SOAJ: 2.5000 mg | SUBCUTANEOUS | 0 refills | Status: DC

## 2023-10-10 NOTE — Progress Notes (Signed)
 Cardiology Office Note:  .   Date:  10/10/2023  ID:  Sinclair Due, DOB 04-27-1946, MRN [id number] PCP: Shelvia Dick, MD  [facility] Providers Cardiologist:  Knox Perl, MD   History of Present Illness: .   Jack [name] is a 78 y.o. 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  Discussed the use of AI scribe software for clinical note transcription with the patient, who gave verbal consent to proceed.  History of Present Illness   Jack [name] is a 78 year old male with atrial fibrillation and hypertrophic cardiomyopathy who presents for cardiovascular follow-up. He is taking amiodarone  100 mg daily for paroxysmal atrial fibrillation, previously at 200 mg. He is on anticoagulants due to a high risk of stroke associated with atrial fibrillation and hypertrophic cardiomyopathy.  For blood pressure management, he is on verapamil  SR 120 mg daily and metoprolol  tartrate 50 mg twice daily. He is not on an ACE inhibitor or ARB due to hypertrophic cardiomyopathy, resulting in a low heart rate and no heart failure symptoms.  He is managing diabetes with Jardiance , metformin , and glyburide. He is interested in reducing his medication burden and is open to new treatments for weight loss and heart protection.       Labs   Lab Results  Component Value Date   CHOL 100 10/23/2022   HDL 37.50 (L) 10/23/2022   LDLCALC 36 10/23/2022   TRIG 131.0 10/23/2022   CHOLHDL 3 10/23/2022   Lab Results  Component Value Date   NA 137 05/13/2023   K 4.4 05/13/2023   CO2 25 05/13/2023   GLUCOSE 105 (H)  05/13/2023   BUN 22 05/13/2023   CREATININE 1.15 05/13/2023   CALCIUM  9.4 05/13/2023   GFR 65.44 02/06/2023   EGFR 52 (L) 04/11/2023   GFRNONAA >60 05/13/2023      Latest Ref Rng & Units 05/13/2023    9:14 AM 04/11/2023   10:26 AM 02/06/2023   10:07 AM  BMP  Glucose 70 - 99 mg/dL 409  811  914   BUN 8 - 23 mg/dL 22  25  16    Creatinine 0.61 - 1.24 mg/dL 7.82  9.56  2.13   BUN/Creat Ratio 10 - 24  18    Sodium 135 - 145 mmol/L 137  144  142   Potassium 3.5 - 5.1 mmol/L 4.4  4.8  4.9   Chloride 98 - 111 mmol/L 106  105  109   CO2 22 - 32 mmol/L 25  24  25    Calcium  8.9 - 10.3 mg/dL 9.4  08.6  9.9       Latest Ref Rng & Units 05/13/2023    9:14 AM 04/11/2023   10:26 AM 01/23/2023    9:01 AM  CBC  WBC 4.0 - 10.5 K/uL 8.9  10.0  9.8   Hemoglobin 13.0 - 17.0 g/dL 57.8  46.9  62.9   Hematocrit 39.0 - 52.0 % 52.5  56.4  53.9   Platelets 150 - 400 K/uL 144  183  203.0    Lab Results  Component Value Date   HGBA1C 7.2 (H) 05/13/2023  Lab Results  Component Value Date   TSH 2.58 01/23/2023     ROS  Review of Systems  Cardiovascular:  Negative for chest pain, dyspnea on exertion and leg swelling.    Physical Exam:   VS:  BP 128/68 (BP Location: Left Arm, Patient Position: Sitting, Cuff Size: Large)   Pulse (!) 56   Resp 16   Ht 5' 7 (1.702 m)   Wt 254 lb 3.2 oz (115.3 kg)   SpO2 96%   BMI 39.81 kg/m    Wt Readings from Last 3 Encounters:  10/10/23 254 lb 3.2 oz (115.3 kg)  05/20/23 260 lb 9.3 oz (118.2 kg)  05/13/23 260 lb 9.3 oz (118.2 kg)    Physical Exam Neck:     Vascular: No carotid bruit or JVD.   Cardiovascular:     Rate and Rhythm: Regular rhythm. Bradycardia present.     Pulses: Intact distal pulses.     Heart sounds: Murmur heard.     Early systolic murmur is present with a grade of 2/6 at the upper right sternal border.     No gallop.  Pulmonary:     Effort: Pulmonary effort is normal.     Breath sounds: Normal breath sounds.  Abdominal:      General: Bowel sounds are normal.     Palpations: Abdomen is soft.   Musculoskeletal:     Right lower leg: No edema.     Left lower leg: No edema.    Studies Reviewed: Aaron Aas    ECHOCARDIOGRAM COMPLETE 03/06/2023  1. 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 The IVSd is 2.3 cm. There appears to be mid-cavitary obliteration during systole that is partially responsible for this gradient. Left ventricular ejection fraction, by estimation, is 65 to 70%. Left ventricular ejection fraction by PLAX is 66 %. The left ventricle has normal function. The left ventricle has no regional wall motion abnormalities. There is severe asymmetric left ventricular hypertrophy of the basal-septal segment. Left ventricular diastolic parameters are consistent with Grade I diastolic dysfunction (impaired relaxation). 2. Right ventricular systolic function is low normal. The right ventricular size is normal. 3. Left atrial size was moderately dilated. 4. The mitral valve is grossly normal. Trivial mitral valve regurgitation. 5. The aortic valve is tricuspid. Aortic valve regurgitation is trivial. 6. Aortic dilatation noted. There is mild dilatation of the ascending aorta, measuring 40 mm.  EKG:    EKG Interpretation Date/Time:  Friday October 10 2023 08:36:58 EDT Ventricular Rate:  50 PR Interval:  222 QRS Duration:  152 QT Interval:  528 QTC Calculation: 481 R Axis:   -79  Text Interpretation: EKG 10/10/2023: Sinus bradycardia with first-degree AV block at the rate of 50 bpm, left anterior fascicular block.  Right bundle branch block. Trifascicular block.  Compared to 05/05/2023, no change. Confirmed by Donalda Job, Jagadeesh ([id number]) on 10/10/2023 8:58:23 AM    Medications ordered    No orders of the defined types were placed in this encounter.    ASSESSMENT AND PLAN: .      ICD-10-CM   1. Paroxysmal atrial fibrillation (HCC)  I48.0 EKG 12-Lead    2. Primary hypertension  I10     3.  Hypertrophic obstructive cardiomyopathy (HOCM) (HCC)  I42.1     4. Controlled type 2 diabetes mellitus without complication, without long-term current use of insulin  (HCC)  E11.9      Assessment and Plan    Paroxysmal atrial fibrillation Maintaining  regular rhythm on amiodarone  100 mg daily, effectively controlling rhythm. - Continue amiodarone  100 mg daily.  Hypertrophic cardiomyopathy Well-managed with metoprolol  and verapamil , effectively controlling heart rate and preventing heart failure symptoms and reducing LVOT obstruction with induced bradycardia.  Anticoagulation therapy is necessary due to high stroke risk associated with atrial fibrillation and hypertrophic cardiomyopathy, despite cost concerns. - Continue metoprolol  tartrate 50 mg twice daily. - Continue verapamil  SR 120 mg daily. - Continue anticoagulation therapy.  Type 2 diabetes mellitus Managed with Jardiance , glipizide , and metformin . Considering transition to GLP-1 agonist for weight loss and cardiovascular protection. Discussed potential side effects such as nausea and the importance of slow eating. Awaiting input from primary care physician regarding this change. - Send message to primary care physician to discuss potential initiation of GLP-1 agonist and discontinuation of metformin  and glipizide .     Hypertension Excellent control of blood pressure with above medications as well no changes indicated.  I will see him back in a year or sooner if problems.   Signed,  Knox Perl, MD, [facility] 10/10/2023, 4:49 PM [facility] [address] [place], [place] [address] Phone: [phone]. Fax:  [phone]

## 2023-10-10 NOTE — Telephone Encounter (Signed)
 Please call patient and tell him that his cardiologist reached out to me today and recommended a trial of Ozempic for his diabetes. I will send in the prescription.  When he starts the Ozempic he can stop the glipizide  and metformin .  Stay on Jardiance . Inform him that the most common side effects that can occur from the medication is nausea, bloating, diarrhea, or constipation. Let him know that there are sometimes some insurance issues with this type of medication that we may have to try to navigate. Office follow-up with me in 2 weeks.

## 2023-10-10 NOTE — Telephone Encounter (Signed)
Recommendations  given to pt

## 2023-10-10 NOTE — Patient Instructions (Signed)

## 2023-10-10 NOTE — Telephone Encounter (Signed)
 LVM for pt to return call

## 2023-10-13 ENCOUNTER — Other Ambulatory Visit: Payer: Self-pay | Admitting: Family Medicine

## 2023-10-15 ENCOUNTER — Ambulatory Visit (INDEPENDENT_AMBULATORY_CARE_PROVIDER_SITE_OTHER)

## 2023-10-15 DIAGNOSIS — E119 Type 2 diabetes mellitus without complications: Secondary | ICD-10-CM

## 2023-10-15 MED ORDER — BLOOD GLUCOSE MONITOR KIT: PACK | 0 refills | Status: DC

## 2023-10-15 MED ORDER — BLOOD GLUCOSE MONITOR KIT: PACK | 0 refills | Status: AC

## 2023-10-15 NOTE — Telephone Encounter (Signed)
 FYI reviewed

## 2023-10-15 NOTE — Progress Notes (Signed)
 Patient came in today for [facility] teaching. It was demonstrated to patient with training pen:   Patient instructed to clean area with alcohol swab in circular motion Remove cap from syringe Hold syringe up to subcutaneous area (preferably back of thigh) insert & push plunger until all medication is administered. Syringe should be clear with no medication in it When finished, discard into sharps container or milk carton; when full can be taken to pharmacy for proper disposal    Patient was able to demonstrate back to me & was comfortable giving injection. If hehe/she/they: he Jack [name] has further questions he will call office.

## 2023-10-16 ENCOUNTER — Other Ambulatory Visit: Payer: Self-pay

## 2023-10-16 DIAGNOSIS — E119 Type 2 diabetes mellitus without complications: Secondary | ICD-10-CM

## 2023-10-16 MED ORDER — ACCU-CHEK GUIDE TEST VI STRP: ORAL_STRIP | 5 refills | Status: AC

## 2023-10-16 MED ORDER — ACCU-CHEK SOFTCLIX LANCETS MISC: 5 refills | Status: AC

## 2023-11-06 ENCOUNTER — Other Ambulatory Visit: Payer: Self-pay

## 2023-11-10 ENCOUNTER — Telehealth: Payer: Self-pay

## 2023-11-10 ENCOUNTER — Telehealth: Payer: Self-pay | Admitting: Family Medicine

## 2023-11-10 NOTE — Telephone Encounter (Signed)
 That is fine. I have not seen him since 1/10. He needs to follow up. OK to do 30d supply of glipizide  and metformin  and jardiance  if he needs them.

## 2023-11-10 NOTE — Telephone Encounter (Signed)
 Copied from CRM [id number]. Topic: Clinical - Medication Question >> Nov 10, 2023  1:04 PM Antonio H wrote: Reason for CRM: Patient's wife was calling regarding the status of patient's refill for mounjaro . Let her know refill was denied due to needing an appointment. Scheduled appointment for this Wednesday. Patient usually takes the injection Wednesday mornings. Would like to know if that's going to mess something up since earliest appointment wasn't until 4pm. Would like a call back at house number, [phone]. >> Nov 10, 2023  1:53 PM Corin V wrote: Patient called back and stated he wanted to stay on the glipizide  and metformin  instead of switching to the mounjaro  due to all the issues with getting it filled consistently. Please call back to confirm when the medications are approved by Dr. McGowen and to confirm dosage instructions with him.

## 2023-11-10 NOTE — Telephone Encounter (Signed)
 Copied from CRM [id number]. Topic: Clinical - Medication Refill >> Nov 10, 2023  3:59 PM Suzen RAMAN wrote: Medication: tirzepatide  (MOUNJARO ) 2.5 MG/0.5ML Pen  Has the patient contacted their pharmacy? Yes  This is the patient's preferred pharmacy:  [id number] - [place], [place] - [address] [place] [address] Phone: [phone] Fax: [phone]  Is this the correct pharmacy for this prescription? Yes If no, delete pharmacy and type the correct one.   Has the prescription been filled recently? No  Is the patient out of the medication? No  Has the patient been seen for an appointment in the last year OR does the patient have an upcoming appointment? Yes  Can we respond through MyChart? Yes  Agent: Please be advised that Rx refills may take up to 3 business days. We ask that you follow-up with your pharmacy.

## 2023-11-10 NOTE — Telephone Encounter (Signed)
 Copied from CRM [id number]. Topic: Clinical - Medication Question >> Nov 10, 2023  1:04 PM Antonio H wrote: Reason for CRM: Patient's wife was calling regarding the status of patient's refill for mounjaro . Let her know refill was denied due to needing an appointment. Scheduled appointment for this Wednesday. Patient usually takes the injection Wednesday mornings. Would like to know if that's going to mess something up since earliest appointment wasn't until 4pm. Would like a call back at house number, [phone]. >> Nov 10, 2023  1:53 PM Corin V wrote: Patient called back and stated he wanted to stay on the glipizide  and metformin  instead of switching to the mounjaro  due to all the issues with getting it filled consistently. Please call back to confirm when the medications are approved by Dr. McGowen and to confirm dosage instructions with him.   Please further advise

## 2023-11-11 NOTE — Addendum Note (Signed)
 Addended by: FLETA CARE D on: 11/11/2023 01:16 PM   Modules accepted: Orders

## 2023-11-11 NOTE — Telephone Encounter (Signed)
 See other encounter.

## 2023-11-11 NOTE — Telephone Encounter (Signed)
 Patient's chart reviewed and no refills are needed for Glipizide , Metformin  or Jardiance  at this time. Pt currently scheduled for 7/23

## 2023-11-11 NOTE — Telephone Encounter (Signed)
 Pt states his wife handles all of his medications so it would be best to call back around 1 or 2 this afternoon.

## 2023-11-12 ENCOUNTER — Ambulatory Visit: Admitting: Family Medicine

## 2023-11-13 ENCOUNTER — Telehealth: Payer: Self-pay | Admitting: Family Medicine

## 2023-11-13 NOTE — Telephone Encounter (Unsigned)
 Copied from CRM [id number]. Topic: Clinical - Medication Refill >> Nov 13, 2023 11:51 AM Carlyon D wrote: Medication: Mounjaro    Has the patient contacted their pharmacy? Yes (Agent: If no, request that the patient contact the pharmacy for the refill. If patient does not wish to contact the pharmacy document the reason why and proceed with request.) (Agent: If yes, when and what did the pharmacy advise?)  This is the patient's preferred pharmacy:  [id number] - [place], [place] - [address] [place] [address] Phone: [phone] Fax: [phone]   Is this the correct pharmacy for this prescription? Yes If no, delete pharmacy and type the correct one.   Has the prescription been filled recently? Yes  Is the patient out of the medication? Yes  Has the patient been seen for an appointment in the last year OR does the patient have an upcoming appointment? Yes  Can we respond through MyChart? Yes  Agent: Please be advised that Rx refills may take up to 3 business days. We ask that you follow-up with your pharmacy.

## 2023-11-13 NOTE — Telephone Encounter (Signed)
 See other encounter attached to 7/15 call.

## 2023-11-13 NOTE — Telephone Encounter (Signed)
 Not on current med list.

## 2023-11-13 NOTE — Telephone Encounter (Addendum)
 LVM for patient regarding medication. Patient previously called on 7/15 stating he decided to stop taking this due to issue with receiving refills.  Copied from CRM [id number]. Topic: Clinical - Medication Refill >> Nov 13, 2023 11:51 AM Carlyon D wrote: Medication: Mounjaro    Has the patient contacted their pharmacy? Yes (Agent: If no, request that the patient contact the pharmacy for the refill. If patient does not wish to contact the pharmacy document the reason why and proceed with request.) (Agent: If yes, when and what did the pharmacy advise?)  This is the patient's preferred pharmacy:  [facility] #[id number] - [place], [place] - [address] [place] [address] Phone: [phone] Fax: [phone]   Is this the correct pharmacy for this prescription? Yes If no, delete pharmacy and type the correct one.   Has the prescription been filled recently? Yes  Is the patient out of the medication? Yes  Has the patient been seen for an appointment in the last year OR does the patient have an upcoming appointment? Yes  Can we respond through MyChart? Yes  Agent: Please be advised that Rx refills may take up to 3 business days. We ask that you follow-up with your pharmacy.

## 2023-11-13 NOTE — Telephone Encounter (Signed)
 Called pt's wife back and she confirmed he has decided not to take the Mounjaro , he has started back on the other 3 medications; Glipizide , Metformin  and Jardiance .

## 2023-11-19 ENCOUNTER — Encounter: Payer: Self-pay | Admitting: Family Medicine

## 2023-11-19 ENCOUNTER — Ambulatory Visit (INDEPENDENT_AMBULATORY_CARE_PROVIDER_SITE_OTHER): Admitting: Family Medicine

## 2023-11-19 VITALS — BP 139/75 | HR 63 | Temp 97.9°F | Ht 67.0 in | Wt 259.8 lb

## 2023-11-19 DIAGNOSIS — I10 Essential (primary) hypertension: Secondary | ICD-10-CM

## 2023-11-19 DIAGNOSIS — E7800 Pure hypercholesterolemia, unspecified: Secondary | ICD-10-CM | POA: Diagnosis not present

## 2023-11-19 DIAGNOSIS — E119 Type 2 diabetes mellitus without complications: Secondary | ICD-10-CM | POA: Diagnosis not present

## 2023-11-19 DIAGNOSIS — D751 Secondary polycythemia: Secondary | ICD-10-CM

## 2023-11-19 DIAGNOSIS — Z0000 Encounter for general adult medical examination without abnormal findings: Secondary | ICD-10-CM

## 2023-11-19 DIAGNOSIS — Z7984 Long term (current) use of oral hypoglycemic drugs: Secondary | ICD-10-CM | POA: Diagnosis not present

## 2023-11-19 LAB — POCT GLYCOSYLATED HEMOGLOBIN (HGB A1C)
HbA1c POC (<> result, manual entry): 6.4 % (ref 4.0–5.6)
HbA1c, POC (controlled diabetic range): 6.4 % (ref 0.0–7.0)
HbA1c, POC (prediabetic range): 6.4 % (ref 5.7–6.4)
Hemoglobin A1C: 6.4 % — AB (ref 4.0–5.6)

## 2023-11-19 MED ORDER — PANTOPRAZOLE SODIUM 40 MG PO TBEC: 40.0000 mg | DELAYED_RELEASE_TABLET | ORAL | 0 refills | Status: DC

## 2023-11-19 MED ORDER — ARNUITY ELLIPTA 200 MCG/ACT IN AEPB: INHALATION_SPRAY | RESPIRATORY_TRACT | 3 refills | Status: AC

## 2023-11-19 MED ORDER — ROSUVASTATIN CALCIUM 20 MG PO TABS: 20.0000 mg | ORAL_TABLET | Freq: Every day | ORAL | 0 refills | Status: DC

## 2023-11-19 MED ORDER — ALLOPURINOL 300 MG PO TABS: 300.0000 mg | ORAL_TABLET | Freq: Two times a day (BID) | ORAL | 1 refills | Status: DC

## 2023-11-19 NOTE — Progress Notes (Signed)
 Office Note 11/19/2023  CC:  Chief Complaint  Patient presents with   Medical Management of Chronic Issues   Patient is a 78 y.o. male who is here accompanied by his wife for annual health maintenance exam and 14-month follow-up diabetes and hypertension. A/P as of last visit: #1 diabetes without complication. Control is adequate. POC hba1c today is 7.1%. Continue Jardiance  25 mg a day, glipizide  XL 10 mg a day, and metformin  1000 mg twice a day.   #2 hypertension, well-controlled on lisinopril  10 mg a day and Lopressor  50 mg twice daily. Most recent electrolytes 1 month ago were normal.  His serum creatinine had risen a little bit to 1.4.  It was felt that this was likely due to concurrent/recent A-fib. He will be getting preop labs for his knee replacement surgery in 1 to 2 weeks.   He prefers to defer lab work today.   3.  Erythrocytosis. Most likely due to uncontrolled sleep apnea when he was noncompliant with his CPAP. He has been compliant with CPAP nightly the last few months.   #4 atrial fibrillation. He is status post successful DCCV. He will need to be on amiodarone  and Eliquis  indefinitely. He is followed closely by Dr. Ladona.   5 chronic right knee pain due to osteoarthritis. He will be getting R TKA on 05/20/23.  INTERIM HX: Brennan feels well. It took him several months to feel like his right knee surgery (right total knee arthroplasty January 2025) helped but he is feeling some improvement now. He took Mounjaro  for about a month.  Lots of problems getting it refilled, etc.  He chose to stop it.  He has continued his metformin , glipizide , and Jardiance .    Past Medical History:  Diagnosis Date   CAD (coronary artery disease)    see PSH section for details   Carotid bruit 05/28/2010   Tortuosity of carotids on doppler u/s, no stenosis.   Cataract    Chronic diastolic heart failure (HCC)    Grd II DD   Colon cancer screening    cologuard POSITIVE  09/2017-->colonoscopy 12/19/17; multiple adenomatous polyps, recall 3 yrs.   Diabetes mellitus    type 2- non insulin -requiring   DOE (dyspnea on exertion)    Cardiology w/u unrevealing except LVOT obstruction: suspect dyspnea due to HOCM, morbid obesity and OSA--stable as of 02/2017 cardiol f/u.   Dysrhythmia    Fatty liver u/s and CT 2009   w/mildly elevated transaminases; u/s 12/2012 reconfirmed dx.  Hep B and C testing negative.   Gout    Heart murmur    Hiatal hernia    History of iron deficiency anemia 02/2019   Hb 12.9 02/2019->home hemoccults ordered but pt never returned these.  GI referral was done but pt did not return their call/unable to be contacted. Fall 2021 iron low but Hb normal->iron supp brought iron levels up (hemoccults neg x 3).   History of kidney stones    Hx of adenomatous colonic polyps 12/27/2017   Hyperlipidemia    Hypertension    severe LVH on echo   Hypertrophic obstructive cardiomyopathy (HOCM) (HCC) 2012   LVOT obstruction stable on echo 01/09/12 and again on f/u echo 09/29/14, 11/2016, 10/2017--with evidence of HOCM due to chordal systolic anterior motion of MV leaflet. Grd II DD.     Moderate persistent asthma 04/19/2014   Morbid obesity (HCC)    OSA on CPAP    Could not tolerate CPAP, not surgical candidate per Dr. Karis.  Restarted CPAP (auto titrate 5-15 with full facemask--Dr. Jude 2017.  Getting mask fitting/leak issues worked out as of 07/2016 pulm f/u.   Osteoarthritis, multiple sites    primarily knees and back.   PAF (paroxysmal atrial fibrillation) (HCC)    2024.  DCCV 03/2023   Pneumonia    Stricture and stenosis of esophagus    Venous insufficiency of both lower extremities     Past Surgical History:  Procedure Laterality Date   CARDIOVASCULAR STRESS TEST  2006; 04/27/10; 10/2017   Normal stress nuclear study, normal EF.  2019: EF 40%, inferior ischemia--intermediate risk study-->Dr. Ladona to do cath.   CARDIOVERSION N/A 04/15/2023    Procedure: CARDIOVERSION;  Surgeon: Loni Soyla LABOR, MD;  Location: [facility] INVASIVE CV LAB;  Service: Cardiovascular;  Laterality: N/A;   CARDIOVERSION     COLONOSCOPY  x 3   Most recent-->12/19/17 (after + cologuard) multiple adenomatous polyps-->recall 3 yrs.   LEFT HEART CATH AND CORONARY ANGIOGRAPHY N/A 01/06/2018   One area of 80% ostial stenosis--small and hard to visualize. No intervention.  DAPT for >1 yr recommended.  Procedure: LEFT HEART CATH AND CORONARY ANGIOGRAPHY;  Surgeon: Ladona Heinz, MD;  Location: [facility] INVASIVE CV LAB;  Service: Cardiovascular;  Laterality: N/A;   left rotator cuff surgery      NASAL SINUS SURGERY     Sleep study  07/2016   OSA; CPAP trial at 10 cm H20 recommended by pulm (Dr. everitt Dios--[facility]).   SPINE SURGERY  1970   ? Discectomy   TEE WITHOUT CARDIOVERSION  05/2010   Dr. Ladona; severe LVH, no valvular abnormalities   TOTAL KNEE ARTHROPLASTY Right 05/20/2023   Procedure: RIGHT TOTAL KNEE ARTHROPLASTY;  Surgeon: Sheril Coy, MD;  Location: [facility];  Service: Orthopedics;  Laterality: Right;   TRANSESOPHAGEAL ECHOCARDIOGRAM  06/06/10; 12/2011; 09/2014; 11/2016   LVOT obst with 135 mm Hg PG. HOCM. No AV obstruction to flow. No mitral regurg..  Repeat echo 01/09/12 no change.  2016: EF 60%, severe conc LV hypertrophy, normal global wall motion, LA mildly dilated, systolic anterior motion of mitral valve chord, mild MR, mild TR, mild pulm HTN--no signif change since 2013.  Repeat echo 11/2016 and 10/2017 no signif change compared to prior studies.   TRANSTHORACIC ECHOCARDIOGRAM  05/28/2010; 10/2017   2012; Normal LVEF, LVOT obst, lateral wall hypokin.  10/2017: EF 55%, hypertrophic CM, grd II dd, normal global wall motion. 02/2022 no change except now with severe LA dilation.  02/2023 No signif change    Family History  Problem Relation Age of Onset   Diabetes Mother    Prostate cancer Father        Prostate   Breast cancer Sister    Diabetes Maternal Aunt     Breast cancer Maternal Aunt    Hyperlipidemia Sister    Hypertension Neg Hx    Coronary artery disease Neg Hx    Colon cancer Neg Hx    Stomach cancer Neg Hx    Rectal cancer Neg Hx    Esophageal cancer Neg Hx     Social History   Socioeconomic History   Marital status: Married    Spouse name: Elijah   Number of children: 1   Years of education: Not on file   Highest education level: Not on file  Occupational History   Occupation: retired    Associate Professor: RETIRED  Tobacco Use   Smoking status: Former    Current packs/day: 0.00    Average packs/day: 0.5  packs/day for 40.0 years (20.0 ttl pk-yrs)    Types: Cigarettes, Cigars    Start date: 04/29/1958    Quit date: 04/29/1998    Years since quitting: 25.5   Smokeless tobacco: Never   Tobacco comments:    4 cigars  Vaping Use   Vaping status: Never Used  Substance and Sexual Activity   Alcohol use: Yes    Comment: rare   Drug use: No   Sexual activity: Not on file  Other Topics Concern   Not on file  Social History Narrative   Married, 1 son, 1 granddaughter.   Retired from [employer] 2000.   Hobbies: trading farm equipment.  Used to be a Editor, commissioning.   Tobacco x many years, quit @ 2005   No ETOH or drugs.   No regular exercise.Smoking Status:  quit            Social Drivers of Health   Financial Resource Strain: Low Risk  (05/14/2023)   Overall Financial Resource Strain (CARDIA)    Difficulty of Paying Living Expenses: Not hard at all  Food Insecurity: No Food Insecurity (05/20/2023)   Hunger Vital Sign    Worried About Running Out of Food in the Last Year: Never true    Ran Out of Food in the Last Year: Never true  Transportation Needs: No Transportation Needs (05/20/2023)   PRAPARE - Administrator, Civil Service (Medical): No    Lack of Transportation (Non-Medical): No  Physical Activity: Insufficiently Active (05/14/2023)   Exercise Vital Sign    Days of Exercise per Week:  3 days    Minutes of Exercise per Session: 30 min  Stress: No Stress Concern Present (05/14/2023)   [employer] of Occupational Health - Occupational Stress Questionnaire    Feeling of Stress : Not at all  Social Connections: Socially Integrated (05/20/2023)   Social Connection and Isolation Panel    Frequency of Communication with Friends and Family: Three times a week    Frequency of Social Gatherings with Friends and Family: Three times a week    Attends Religious Services: More than 4 times per year    Active Member of Clubs or Organizations: Yes    Attends Banker Meetings: More than 4 times per year    Marital Status: Married  Catering manager Violence: Not At Risk (05/20/2023)   Humiliation, Afraid, Rape, and Kick questionnaire    Fear of Current or Ex-Partner: No    Emotionally Abused: No    Physically Abused: No    Sexually Abused: No    Outpatient Medications Prior to Visit  Medication Sig Dispense Refill   Accu-Chek Softclix Lancets lancets Use as instructed to check blood sugar 4 times daily 500 each 5   acetaminophen  (TYLENOL ) 650 MG CR tablet Take 1,300 mg by mouth every 8 (eight) hours as needed for pain.     albuterol  (PROAIR  HFA) 108 (90 Base) MCG/ACT inhaler INHALE 2 PUFFS INTO THE LUNGS EVERY 4 HOURS AS NEEDED FOR WHEEZING 8.5 g 0   amiodarone  (PACERONE ) 200 MG tablet Take 0.5 tablets (100 mg total) by mouth daily. 45 tablet 2   apixaban  (ELIQUIS ) 5 MG TABS tablet Take 1 tablet (5 mg total) by mouth 2 (two) times daily. 60 tablet 6   blood glucose meter kit and supplies KIT Dispense based on patient and insurance preference. Use up to four times daily as directed. 1 each 0   cetirizine (ZYRTEC) 10  MG tablet Take 10 mg by mouth daily as needed for allergies.     chlorhexidine  (HIBICLENS ) 4 % external liquid Apply 15 mLs (1 Application total) topically as directed for 30 doses. Use as directed daily for 5 days every other week for 6 weeks. 946 mL 1    Cholecalciferol (VITAMIN D) 2000 UNITS tablet Take 2,000 Units by mouth daily.     diclofenac  Sodium (VOLTAREN ) 1 % GEL Apply 2 g topically 4 (four) times daily. 100 g 3   docusate sodium  (COLACE) 100 MG capsule Take 100 mg by mouth every other day.     empagliflozin  (JARDIANCE ) 25 MG TABS tablet Take 1 tablet (25 mg total) by mouth daily before breakfast. 90 tablet 3   fluticasone  (FLONASE ) 50 MCG/ACT nasal spray USE 2 SPRAYS IN BOTH NOSTRILS  DAILY AS NEEDED FOR ALLERGIES 64 g 1   glipiZIDE  (GLUCOTROL  XL) 10 MG 24 hr tablet Take 1 tablet (10 mg total) by mouth daily. 90 tablet 3   glucose blood (ACCU-CHEK GUIDE TEST) test strip Use as instructed to check blood sugar 4 times daily 500 each 5   metFORMIN  (GLUCOPHAGE ) 1000 MG tablet Take 1 tablet (1,000 mg total) by mouth 2 (two) times daily with a meal. 180 tablet 3   metoprolol  tartrate (LOPRESSOR ) 50 MG tablet TAKE 1 TABLET BY MOUTH TWICE  DAILY 180 tablet 3   NON FORMULARY Pt uses a c-pap nightly     traZODone  (DESYREL ) 50 MG tablet TAKE 1 TABLET(50 MG) BY MOUTH AT BEDTIME 30 tablet 1   verapamil  (CALAN -SR) 120 MG CR tablet TAKE 1 TABLET BY MOUTH IN THE  MORNING 90 tablet 3   ondansetron  (ZOFRAN ) 8 MG tablet Take 1 tablet (8 mg total) by mouth every 8 (eight) hours as needed for nausea or vomiting. (Patient not taking: Reported on 11/19/2023) 20 tablet 0   tiZANidine  (ZANAFLEX ) 4 MG tablet Take 1 tablet (4 mg total) by mouth every 6 (six) hours as needed for muscle spasms. (Patient not taking: Reported on 11/19/2023) 40 tablet 1   allopurinol  (ZYLOPRIM ) 300 MG tablet Take 1 tablet (300 mg total) by mouth 2 (two) times daily. 180 tablet 1   Fluticasone  Furoate (ARNUITY ELLIPTA ) 200 MCG/ACT AEPB 1 puff qd 90 each 3   pantoprazole  (PROTONIX ) 40 MG tablet Take 1 tablet (40 mg total) by mouth every other day. 90 tablet 0   rosuvastatin  (CRESTOR ) 20 MG tablet TAKE 1 TABLET BY MOUTH AT  BEDTIME 90 tablet 0   No facility-administered medications prior to  visit.    No Known Allergies  Review of Systems  Constitutional:  Negative for appetite change, chills, fatigue and fever.  HENT:  Negative for congestion, dental problem, ear pain and sore throat.   Eyes:  Negative for discharge, redness and visual disturbance.  Respiratory:  Negative for cough, chest tightness, shortness of breath and wheezing.   Cardiovascular:  Negative for chest pain, palpitations and leg swelling.  Gastrointestinal:  Negative for abdominal pain, blood in stool, diarrhea, nausea and vomiting.  Genitourinary:  Negative for difficulty urinating, dysuria, flank pain, frequency, hematuria and urgency.  Musculoskeletal:  Negative for arthralgias, back pain, joint swelling, myalgias and neck stiffness.  Skin:  Negative for pallor and rash.  Neurological:  Negative for dizziness, speech difficulty, weakness and headaches.  Hematological:  Negative for adenopathy. Does not bruise/bleed easily.  Psychiatric/Behavioral:  Negative for confusion and sleep disturbance. The patient is not nervous/anxious.    PE;  11/19/2023    2:36 PM 10/10/2023    8:29 AM 05/21/2023    6:12 AM  Vitals with BMI  Height 5' 7 5' 7   Weight 259 lbs 13 oz 254 lbs 3 oz   BMI 40.68 39.8   Systolic 139 128 847  Diastolic 75 68 77  Pulse 63 56 66   Gen: Alert, well appearing.  Patient is oriented to person, place, time, and situation. AFFECT: pleasant, lucid thought and speech. ENT: Ears: EACs clear, normal epithelium.  TMs with good light reflex and landmarks bilaterally.  Eyes: no injection, icteris, swelling, or exudate.  EOMI, PERRLA. Nose: no drainage or turbinate edema/swelling.  No injection or focal lesion.  Mouth: lips without lesion/swelling.  Oral mucosa pink and moist.  Dentition intact and without obvious caries or gingival swelling.  Oropharynx without erythema, exudate, or swelling.  Neck: supple/nontender.  No LAD, mass, or TM.  Carotid pulses 2+ bilaterally, without bruits. CV:  RRR, no m/r/g.   LUNGS: CTA bilat, nonlabored resps, good aeration in all lung fields. ABD: soft, NT, ND, BS normal.  No hepatospenomegaly or mass.  No bruits. EXT: 1+ bilateral lower extremity pitting edema  musculoskeletal: Right knee diffusely swollen.  Otherwise no joint swelling, erythema, warmth, or tenderness.  ROM of all joints intact. Skin - no sores or suspicious lesions or rashes or color changes  Pertinent labs:  Lab Results  Component Value Date   TSH 2.58 01/23/2023   Lab Results  Component Value Date   WBC 8.9 05/13/2023   HGB 17.0 05/13/2023   HCT 52.5 (H) 05/13/2023   MCV 95.5 05/13/2023   PLT 144 (L) 05/13/2023   Lab Results  Component Value Date   CREATININE 1.15 05/13/2023   BUN 22 05/13/2023   NA 137 05/13/2023   K 4.4 05/13/2023   CL 106 05/13/2023   CO2 25 05/13/2023   Lab Results  Component Value Date   ALT 23 10/23/2022   AST 29 10/23/2022   ALKPHOS 54 10/23/2022   BILITOT 0.8 10/23/2022   Lab Results  Component Value Date   CHOL 100 10/23/2022   Lab Results  Component Value Date   HDL 37.50 (L) 10/23/2022   Lab Results  Component Value Date   LDLCALC 36 10/23/2022   Lab Results  Component Value Date   TRIG 131.0 10/23/2022   Lab Results  Component Value Date   CHOLHDL 3 10/23/2022   Lab Results  Component Value Date   PSA 0.81 02/28/2017   PSA 0.70 11/28/2015   PSA 0.71 04/19/2014   Lab Results  Component Value Date   HGBA1C 6.4 (A) 11/19/2023   HGBA1C 6.4 11/19/2023   HGBA1C 6.4 11/19/2023   HGBA1C 6.4 11/19/2023   ASSESSMENT AND PLAN:   No problem-specific Assessment & Plan notes found for this encounter.  #1 health maintenance exam: Reviewed age and gender appropriate health maintenance issues (prudent diet, regular exercise, health risks of tobacco and excessive alcohol, use of seatbelts, fire alarms in home, use of sunscreen).  Also reviewed age and gender appropriate health screening as well as vaccine  recommendations. Vaccines: all UTD. Labs: cbc,cmet,lipids, Hba1c, urine microalb/cr. Prostate ca screening: Patient elected to stop routine screening in 2020.   Colon ca screening: had + cologuard followed by a colonoscopy that showed adenomatous polyps 2019.  #2 diabetes without complication. Control is improved. POC hba1c today is 6.4%%. Continue Jardiance  25 mg a day, glipizide  XL 10 mg a day, and metformin  1000  mg twice a day.   #3 hypertension, well-controlled on lisinopril  10 mg a day and Lopressor  50 mg twice daily. Monitor electrolytes and creatinine today.   #4 hypercholesterolemia.  LDL was 36 in June 2024. Continue Crestor  20 mg a day. Nonfasting lipid panel today.   #5 atrial fibrillation. He is status post successful DCCV 03/2023. He will need to be on amiodarone  and Eliquis  indefinitely. He is followed closely by Dr. Ladona.  An After Visit Summary was printed and given to the patient.  FOLLOW UP:  Return in about 3 months (around 02/19/2024) for routine chronic illness f/u.  Signed:  Gerlene Hockey, MD           11/19/2023

## 2023-11-19 NOTE — Patient Instructions (Signed)
 Health Maintenance, Male  Adopting a healthy lifestyle and getting preventive care are important in promoting health and wellness. Ask your health care provider about:  The right schedule for you to have regular tests and exams.  Things you can do on your own to prevent diseases and keep yourself healthy.  What should I know about diet, weight, and exercise?  Eat a healthy diet    Eat a diet that includes plenty of vegetables, fruits, low-fat dairy products, and lean protein.  Do not eat a lot of foods that are high in solid fats, added sugars, or sodium.  Maintain a healthy weight  Body mass index (BMI) is a measurement that can be used to identify possible weight problems. It estimates body fat based on height and weight. Your health care provider can help determine your BMI and help you achieve or maintain a healthy weight.  Get regular exercise  Get regular exercise. This is one of the most important things you can do for your health. Most adults should:  Exercise for at least 150 minutes each week. The exercise should increase your heart rate and make you sweat (moderate-intensity exercise).  Do strengthening exercises at least twice a week. This is in addition to the moderate-intensity exercise.  Spend less time sitting. Even light physical activity can be beneficial.  Watch cholesterol and blood lipids  Have your blood tested for lipids and cholesterol at 77 years of age, then have this test every 5 years.  You may need to have your cholesterol levels checked more often if:  Your lipid or cholesterol levels are high.  You are older than 77 years of age.  You are at high risk for heart disease.  What should I know about cancer screening?  Many types of cancers can be detected early and may often be prevented. Depending on your health history and family history, you may need to have cancer screening at various ages. This may include screening for:  Colorectal cancer.  Prostate cancer.  Skin cancer.  Lung  cancer.  What should I know about heart disease, diabetes, and high blood pressure?  Blood pressure and heart disease  High blood pressure causes heart disease and increases the risk of stroke. This is more likely to develop in people who have high blood pressure readings or are overweight.  Talk with your health care provider about your target blood pressure readings.  Have your blood pressure checked:  Every 3-5 years if you are 9-95 years of age.  Every year if you are 85 years old or older.  If you are between the ages of 29 and 29 and are a current or former smoker, ask your health care provider if you should have a one-time screening for abdominal aortic aneurysm (AAA).  Diabetes  Have regular diabetes screenings. This checks your fasting blood sugar level. Have the screening done:  Once every three years after age 23 if you are at a normal weight and have a low risk for diabetes.  More often and at a younger age if you are overweight or have a high risk for diabetes.  What should I know about preventing infection?  Hepatitis B  If you have a higher risk for hepatitis B, you should be screened for this virus. Talk with your health care provider to find out if you are at risk for hepatitis B infection.  Hepatitis C  Blood testing is recommended for:  Everyone born from [birth year] through 1964.  Anyone  with known risk factors for hepatitis C.  Sexually transmitted infections (STIs)  You should be screened each year for STIs, including gonorrhea and chlamydia, if:  You are sexually active and are younger than 77 years of age.  You are older than 78 years of age and your health care provider tells you that you are at risk for this type of infection.  Your sexual activity has changed since you were last screened, and you are at increased risk for chlamydia or gonorrhea. Ask your health care provider if you are at risk.  Ask your health care provider about whether you are at high risk for HIV. Your health care provider  may recommend a prescription medicine to help prevent HIV infection. If you choose to take medicine to prevent HIV, you should first get tested for HIV. You should then be tested every 3 months for as long as you are taking the medicine.  Follow these instructions at home:  Alcohol use  Do not drink alcohol if your health care provider tells you not to drink.  If you drink alcohol:  Limit how much you have to 0-2 drinks a day.  Know how much alcohol is in your drink. In the [place] one drink equals one 12 oz bottle of beer (355 mL), one 5 oz glass of wine (148 mL), or one 1 oz glass of hard liquor (44 mL).  Lifestyle  Do not use any products that contain nicotine or tobacco. These products include cigarettes, chewing tobacco, and vaping devices, such as e-cigarettes. If you need help quitting, ask your health care provider.  Do not use street drugs.  Do not share needles.  Ask your health care provider for help if you need support or information about quitting drugs.  General instructions  Schedule regular health, dental, and eye exams.  Stay current with your vaccines.  Tell your health care provider if:  You often feel depressed.  You have ever been abused or do not feel safe at home.  Summary  Adopting a healthy lifestyle and getting preventive care are important in promoting health and wellness.  Follow your health care provider's instructions about healthy diet, exercising, and getting tested or screened for diseases.  Follow your health care provider's instructions on monitoring your cholesterol and blood pressure.  This information is not intended to replace advice given to you by your health care provider. Make sure you discuss any questions you have with your health care provider.  Document Revised: 09/04/2020 Document Reviewed: 09/04/2020  Elsevier Patient Education  2024 [employer].

## 2023-11-20 ENCOUNTER — Encounter: Payer: Self-pay | Admitting: Family Medicine

## 2023-11-20 ENCOUNTER — Ambulatory Visit: Payer: Self-pay | Admitting: Family Medicine

## 2023-11-20 DIAGNOSIS — R7989 Other specified abnormal findings of blood chemistry: Secondary | ICD-10-CM

## 2023-11-20 LAB — CBC WITH DIFFERENTIAL/PLATELET
Basophils Absolute: 0.1 K/uL (ref 0.0–0.1)
Basophils Relative: 1.4 % (ref 0.0–3.0)
Eosinophils Absolute: 0.3 K/uL (ref 0.0–0.7)
Eosinophils Relative: 4 % (ref 0.0–5.0)
HCT: 49 % (ref 39.0–52.0)
Hemoglobin: 15.9 g/dL (ref 13.0–17.0)
Lymphocytes Relative: 26.9 % (ref 12.0–46.0)
Lymphs Abs: 2.3 K/uL (ref 0.7–4.0)
MCHC: 32.4 g/dL (ref 30.0–36.0)
MCV: 94.8 fl (ref 78.0–100.0)
Monocytes Absolute: 0.8 K/uL (ref 0.1–1.0)
Monocytes Relative: 9 % (ref 3.0–12.0)
Neutro Abs: 5.1 K/uL (ref 1.4–7.7)
Neutrophils Relative %: 58.7 % (ref 43.0–77.0)
Platelets: 136 K/uL — ABNORMAL LOW (ref 150.0–400.0)
RBC: 5.17 Mil/uL (ref 4.22–5.81)
RDW: 16.2 % — ABNORMAL HIGH (ref 11.5–15.5)
WBC: 8.6 K/uL (ref 4.0–10.5)

## 2023-11-20 LAB — COMPREHENSIVE METABOLIC PANEL WITH GFR
ALT: 21 U/L (ref 0–53)
AST: 24 U/L (ref 0–37)
Albumin: 4.4 g/dL (ref 3.5–5.2)
Alkaline Phosphatase: 63 U/L (ref 39–117)
BUN: 17 mg/dL (ref 6–23)
CO2: 30 meq/L (ref 19–32)
Calcium: 9.5 mg/dL (ref 8.4–10.5)
Chloride: 107 meq/L (ref 96–112)
Creatinine, Ser: 1.44 mg/dL (ref 0.40–1.50)
GFR: 47.11 mL/min — ABNORMAL LOW (ref >60.00)
Glucose, Bld: 86 mg/dL (ref 70–99)
Potassium: 4.7 meq/L (ref 3.5–5.1)
Sodium: 144 meq/L (ref 135–145)
Total Bilirubin: 0.8 mg/dL (ref 0.2–1.2)
Total Protein: 6.4 g/dL (ref 6.0–8.3)

## 2023-11-20 LAB — LIPID PANEL
Cholesterol: 101 mg/dL (ref 0–200)
HDL: 38.1 mg/dL — ABNORMAL LOW (ref >39.00)
LDL Cholesterol: 21 mg/dL (ref 0–99)
NonHDL: 63.28
Total CHOL/HDL Ratio: 3
Triglycerides: 210 mg/dL — ABNORMAL HIGH (ref 0.0–149.0)
VLDL: 42 mg/dL — ABNORMAL HIGH (ref 0.0–40.0)

## 2023-11-20 LAB — MICROALBUMIN / CREATININE URINE RATIO
Creatinine,U: 76 mg/dL
Microalb Creat Ratio: 14.1 mg/g (ref 0.0–30.0)
Microalb, Ur: 1.1 mg/dL (ref 0.0–1.9)

## 2023-12-01 ENCOUNTER — Other Ambulatory Visit (INDEPENDENT_AMBULATORY_CARE_PROVIDER_SITE_OTHER)

## 2023-12-01 DIAGNOSIS — R7989 Other specified abnormal findings of blood chemistry: Secondary | ICD-10-CM

## 2023-12-01 LAB — BASIC METABOLIC PANEL WITH GFR
BUN: 20 mg/dL (ref 6–23)
CO2: 28 meq/L (ref 19–32)
Calcium: 9.8 mg/dL (ref 8.4–10.5)
Chloride: 107 meq/L (ref 96–112)
Creatinine, Ser: 1.4 mg/dL (ref 0.40–1.50)
GFR: 48.72 mL/min — ABNORMAL LOW (ref >60.00)
Glucose, Bld: 90 mg/dL (ref 70–99)
Potassium: 4.8 meq/L (ref 3.5–5.1)
Sodium: 143 meq/L (ref 135–145)

## 2023-12-03 ENCOUNTER — Other Ambulatory Visit: Payer: Self-pay | Admitting: Family Medicine

## 2023-12-03 DIAGNOSIS — E119 Type 2 diabetes mellitus without complications: Secondary | ICD-10-CM

## 2023-12-04 ENCOUNTER — Ambulatory Visit: Payer: Self-pay | Admitting: Family Medicine

## 2024-01-08 DIAGNOSIS — L821 Other seborrheic keratosis: Secondary | ICD-10-CM | POA: Diagnosis not present

## 2024-01-08 DIAGNOSIS — D0472 Carcinoma in situ of skin of left lower limb, including hip: Secondary | ICD-10-CM | POA: Diagnosis not present

## 2024-01-08 DIAGNOSIS — L570 Actinic keratosis: Secondary | ICD-10-CM | POA: Diagnosis not present

## 2024-01-29 ENCOUNTER — Telehealth: Payer: Self-pay

## 2024-01-29 DIAGNOSIS — E119 Type 2 diabetes mellitus without complications: Secondary | ICD-10-CM

## 2024-01-29 MED ORDER — JARDIANCE 25 MG PO TABS
25.0000 mg | ORAL_TABLET | Freq: Every day | ORAL | 0 refills | Status: AC
Start: 2024-01-29 — End: ?

## 2024-01-29 NOTE — Telephone Encounter (Signed)
 Signed and put in box to go up front. Signed:  Gerlene Hockey, MD           01/29/2024

## 2024-01-29 NOTE — Telephone Encounter (Signed)
Placed on PCP desk to review and sign, if appropriate.  

## 2024-01-29 NOTE — Telephone Encounter (Signed)
 Type of forms received:  [employer] patient assistance program re-enrollment  Routed to:  [employer] received by :  via fax   Individual made aware of 5-7 business day turn around (Y/N): n/a  Form completed and patient made aware of charges(Y/N): N/a  Faxed to :   Form location:   [place] inbox front office

## 2024-02-19 ENCOUNTER — Ambulatory Visit: Payer: Self-pay | Admitting: Family Medicine

## 2024-02-19 ENCOUNTER — Ambulatory Visit (INDEPENDENT_AMBULATORY_CARE_PROVIDER_SITE_OTHER): Admitting: Family Medicine

## 2024-02-19 ENCOUNTER — Encounter: Payer: Self-pay | Admitting: Family Medicine

## 2024-02-19 VITALS — BP 126/77 | HR 52 | Temp 98.2°F | Wt 260.6 lb

## 2024-02-19 DIAGNOSIS — E7800 Pure hypercholesterolemia, unspecified: Secondary | ICD-10-CM | POA: Diagnosis not present

## 2024-02-19 DIAGNOSIS — Z79899 Other long term (current) drug therapy: Secondary | ICD-10-CM | POA: Diagnosis not present

## 2024-02-19 DIAGNOSIS — Z5181 Encounter for therapeutic drug level monitoring: Secondary | ICD-10-CM | POA: Diagnosis not present

## 2024-02-19 DIAGNOSIS — E119 Type 2 diabetes mellitus without complications: Secondary | ICD-10-CM

## 2024-02-19 DIAGNOSIS — I10 Essential (primary) hypertension: Secondary | ICD-10-CM | POA: Diagnosis not present

## 2024-02-19 DIAGNOSIS — Z7984 Long term (current) use of oral hypoglycemic drugs: Secondary | ICD-10-CM | POA: Diagnosis not present

## 2024-02-19 DIAGNOSIS — J069 Acute upper respiratory infection, unspecified: Secondary | ICD-10-CM | POA: Diagnosis not present

## 2024-02-19 DIAGNOSIS — E1121 Type 2 diabetes mellitus with diabetic nephropathy: Secondary | ICD-10-CM

## 2024-02-19 LAB — BASIC METABOLIC PANEL WITH GFR
BUN: 13 mg/dL (ref 6–23)
CO2: 28 meq/L (ref 19–32)
Calcium: 9.3 mg/dL (ref 8.4–10.5)
Chloride: 109 meq/L (ref 96–112)
Creatinine, Ser: 1.16 mg/dL (ref 0.40–1.50)
GFR: 60.96 mL/min (ref >60.00)
Glucose, Bld: 109 mg/dL — ABNORMAL HIGH (ref 70–99)
Potassium: 4.4 meq/L (ref 3.5–5.1)
Sodium: 144 meq/L (ref 135–145)

## 2024-02-19 LAB — TSH: TSH: 3.15 u[IU]/mL (ref 0.35–5.50)

## 2024-02-19 LAB — HEMOGLOBIN A1C: Hgb A1c MFr Bld: 6.8 % — ABNORMAL HIGH (ref 4.6–6.5)

## 2024-02-19 LAB — VITAMIN B12: Vitamin B-12: 277 pg/mL (ref 211–911)

## 2024-02-19 MED ORDER — ROSUVASTATIN CALCIUM 20 MG PO TABS: 20.0000 mg | ORAL_TABLET | Freq: Every day | ORAL | 3 refills | Status: AC

## 2024-02-19 MED ORDER — PANTOPRAZOLE SODIUM 40 MG PO TBEC: 40.0000 mg | DELAYED_RELEASE_TABLET | ORAL | 3 refills | Status: AC

## 2024-02-19 MED ORDER — HYDROCODONE BIT-HOMATROP MBR 5-1.5 MG/5ML PO SOLN: ORAL | 0 refills | Status: AC

## 2024-02-19 NOTE — Progress Notes (Signed)
 OFFICE VISIT  02/19/2024  CC:  Chief Complaint  Patient presents with   Medical Management of Chronic Issues    Pt is fasting    Patient is a 78 y.o. male who presents accompanied by his wife Edna for 55-month follow-up diabetes, hypertension, and hypercholesterolemia. A/P as of last visit: 1 diabetes without complication. Control is improved. POC hba1c today is 6.4%. Continue Jardiance  25 mg a day, glipizide  XL 10 mg a day, and metformin  1000 mg twice a day.   #2 hypertension, well-controlled on Lopressor  50 mg twice daily. Monitor electrolytes and creatinine today. No ARB or ACE-I per cardiologist b/c hypertrophic cardiomyopathy.   #3 hypercholesterolemia.  LDL was 36 in June 2024. Continue Crestor  20 mg a day. Nonfasting lipid panel today.   #4 atrial fibrillation. He is status post successful DCCV 03/2023. He will need to be on amiodarone  and Eliquis  indefinitely. He is followed closely by Dr. Ladona.  INTERIM HX: Jack [name] has had nasal congestion/runny nose and some cough for about a week.  On the first day he did have a slight fever.  No body aches.  No nausea or vomiting or diarrhea. He does feel like his symptoms are improving gradually but still has a significant cough at certain times of the day, particularly before he goes to bed. He denies any wheezing or shortness of breath.  ROS as above, plus--> no CP, no dizziness, no HAs, no rashes, no melena/hematochezia.  No polyuria or polydipsia.  No myalgias or arthralgias.  No focal weakness, paresthesias, or tremors.  No acute vision or hearing abnormalities.  No dysuria or unusual/new urinary urgency or frequency.   No n/v/d or abd pain.  No palpitations.     Past Medical History:  Diagnosis Date   CAD (coronary artery disease)    see PSH section for details   Carotid bruit 05/28/2010   Tortuosity of carotids on doppler u/s, no stenosis.   Cataract    Chronic diastolic heart failure (HCC)    Grd II DD    Chronic renal insufficiency, stage 3 (moderate)    Colon cancer screening    cologuard POSITIVE 09/2017-->colonoscopy 12/19/17; multiple adenomatous polyps, recall 3 yrs.   Diabetes mellitus    type 2- non insulin -requiring   DOE (dyspnea on exertion)    Cardiology w/u unrevealing except LVOT obstruction: suspect dyspnea due to HOCM, morbid obesity and OSA--stable as of 02/2017 cardiol f/u.   Dysrhythmia    Fatty liver u/s and CT 2009   w/mildly elevated transaminases; u/s 12/2012 reconfirmed dx.  Hep B and C testing negative.   Gout    Heart murmur    Hiatal hernia    History of iron deficiency anemia 02/2019   Hb 12.9 02/2019->home hemoccults ordered but pt never returned these.  GI referral was done but pt did not return their call/unable to be contacted. Fall 2021 iron low but Hb normal->iron supp brought iron levels up (hemoccults neg x 3).   History of kidney stones    Hx of adenomatous colonic polyps 12/27/2017   Hyperlipidemia    Hypertension    severe LVH on echo   Hypertrophic obstructive cardiomyopathy (HOCM) (HCC) 2012   LVOT obstruction stable on echo 01/09/12 and again on f/u echo 09/29/14, 11/2016, 10/2017--with evidence of HOCM due to chordal systolic anterior motion of MV leaflet. Grd II DD.     Moderate persistent asthma 04/19/2014   Morbid obesity (HCC)    OSA on CPAP    Could not  tolerate CPAP, not surgical candidate per Dr. Karis.  Restarted CPAP (auto titrate 5-15 with full facemask--Dr. Jude 2017.  Getting mask fitting/leak issues worked out as of 07/2016 pulm f/u.   Osteoarthritis, multiple sites    primarily knees and back.   PAF (paroxysmal atrial fibrillation) (HCC)    2024.  DCCV 03/2023   Pneumonia    Stricture and stenosis of esophagus    Venous insufficiency of both lower extremities     Past Surgical History:  Procedure Laterality Date   CARDIOVASCULAR STRESS TEST  2006; 04/27/10; 10/2017   Normal stress nuclear study, normal EF.  2019: EF 40%, inferior  ischemia--intermediate risk study-->Dr. Ladona to do cath.   CARDIOVERSION N/A 04/15/2023   Procedure: CARDIOVERSION;  Surgeon: Loni Soyla LABOR, MD;  Location: [facility] INVASIVE CV LAB;  Service: Cardiovascular;  Laterality: N/A;   CARDIOVERSION     COLONOSCOPY  x 3   Most recent-->12/19/17 (after + cologuard) multiple adenomatous polyps-->recall 3 yrs.   LEFT HEART CATH AND CORONARY ANGIOGRAPHY N/A 01/06/2018   One area of 80% ostial stenosis--small and hard to visualize. No intervention.  DAPT for >1 yr recommended.  Procedure: LEFT HEART CATH AND CORONARY ANGIOGRAPHY;  Surgeon: Ladona Heinz, MD;  Location: [facility] INVASIVE CV LAB;  Service: Cardiovascular;  Laterality: N/A;   left rotator cuff surgery      NASAL SINUS SURGERY     Sleep study  07/2016   OSA; CPAP trial at 10 cm H20 recommended by pulm (Dr. everitt Dios--[place] pulm).   SPINE SURGERY  1970   ? Discectomy   TEE WITHOUT CARDIOVERSION  05/2010   Dr. Ladona; severe LVH, no valvular abnormalities   TOTAL KNEE ARTHROPLASTY Right 05/20/2023   Procedure: RIGHT TOTAL KNEE ARTHROPLASTY;  Surgeon: Sheril Coy, MD;  Location: [facility] ORS;  Service: Orthopedics;  Laterality: Right;   TRANSESOPHAGEAL ECHOCARDIOGRAM  06/06/10; 12/2011; 09/2014; 11/2016   LVOT obst with 135 mm Hg PG. HOCM. No AV obstruction to flow. No mitral regurg..  Repeat echo 01/09/12 no change.  2016: EF 60%, severe conc LV hypertrophy, normal global wall motion, LA mildly dilated, systolic anterior motion of mitral valve chord, mild MR, mild TR, mild pulm HTN--no signif change since 2013.  Repeat echo 11/2016 and 10/2017 no signif change compared to prior studies.   TRANSTHORACIC ECHOCARDIOGRAM  05/28/2010; 10/2017   2012; Normal LVEF, LVOT obst, lateral wall hypokin.  10/2017: EF 55%, hypertrophic CM, grd II dd, normal global wall motion. 02/2022 no change except now with severe LA dilation.  02/2023 No signif change    Outpatient Medications Prior to Visit  Medication Sig Dispense Refill    Accu-Chek Softclix Lancets lancets Use as instructed to check blood sugar 4 times daily 500 each 5   acetaminophen  (TYLENOL ) 650 MG CR tablet Take 1,300 mg by mouth every 8 (eight) hours as needed for pain.     albuterol  (PROAIR  HFA) 108 (90 Base) MCG/ACT inhaler INHALE 2 PUFFS INTO THE LUNGS EVERY 4 HOURS AS NEEDED FOR WHEEZING 8.5 g 0   allopurinol  (ZYLOPRIM ) 300 MG tablet Take 1 tablet (300 mg total) by mouth 2 (two) times daily. 180 tablet 1   amiodarone  (PACERONE ) 200 MG tablet Take 0.5 tablets (100 mg total) by mouth daily. 45 tablet 2   apixaban  (ELIQUIS ) 5 MG TABS tablet Take 1 tablet (5 mg total) by mouth 2 (two) times daily. 60 tablet 6   blood glucose meter kit and supplies KIT Dispense based on patient and insurance  preference. Use up to four times daily as directed. 1 each 0   cetirizine (ZYRTEC) 10 MG tablet Take 10 mg by mouth daily as needed for allergies.     chlorhexidine  (HIBICLENS ) 4 % external liquid Apply 15 mLs (1 Application total) topically as directed for 30 doses. Use as directed daily for 5 days every other week for 6 weeks. 946 mL 1   Cholecalciferol (VITAMIN D) 2000 UNITS tablet Take 2,000 Units by mouth daily.     diclofenac  Sodium (VOLTAREN ) 1 % GEL Apply 2 g topically 4 (four) times daily. 100 g 3   docusate sodium  (COLACE) 100 MG capsule Take 100 mg by mouth every other day.     fluticasone  (FLONASE ) 50 MCG/ACT nasal spray USE 2 SPRAYS IN BOTH NOSTRILS  DAILY AS NEEDED FOR ALLERGIES 64 g 1   Fluticasone  Furoate (ARNUITY ELLIPTA ) 200 MCG/ACT AEPB 1 puff qd 90 each 3   glipiZIDE  (GLUCOTROL  XL) 10 MG 24 hr tablet Take 1 tablet (10 mg total) by mouth daily. 90 tablet 3   glucose blood (ACCU-CHEK GUIDE TEST) test strip Use as instructed to check blood sugar 4 times daily 500 each 5   JARDIANCE  25 MG TABS tablet Take 1 tablet (25 mg total) by mouth daily. 90 tablet 0   metFORMIN  (GLUCOPHAGE ) 1000 MG tablet Take 1 tablet (1,000 mg total) by mouth 2 (two) times daily with a  meal. 180 tablet 3   metoprolol  tartrate (LOPRESSOR ) 50 MG tablet TAKE 1 TABLET BY MOUTH TWICE  DAILY 180 tablet 3   NON FORMULARY Pt uses a c-pap nightly     ondansetron  (ZOFRAN ) 8 MG tablet Take 1 tablet (8 mg total) by mouth every 8 (eight) hours as needed for nausea or vomiting. 20 tablet 0   traZODone  (DESYREL ) 50 MG tablet TAKE 1 TABLET(50 MG) BY MOUTH AT BEDTIME 30 tablet 1   verapamil  (CALAN -SR) 120 MG CR tablet TAKE 1 TABLET BY MOUTH IN THE  MORNING 90 tablet 3   tiZANidine  (ZANAFLEX ) 4 MG tablet Take 1 tablet (4 mg total) by mouth every 6 (six) hours as needed for muscle spasms. (Patient not taking: Reported on 02/19/2024) 40 tablet 1   pantoprazole  (PROTONIX ) 40 MG tablet Take 1 tablet (40 mg total) by mouth every other day. 90 tablet 0   rosuvastatin  (CRESTOR ) 20 MG tablet Take 1 tablet (20 mg total) by mouth at bedtime. 90 tablet 0   No facility-administered medications prior to visit.    No Known Allergies  Review of Systems As per HPI  PE:    02/19/2024    8:02 AM 11/19/2023    2:36 PM 10/10/2023    8:29 AM  Vitals with BMI  Height  5' 7 5' 7  Weight 260 lbs 10 oz 259 lbs 13 oz 254 lbs 3 oz  BMI  40.68 39.8  Systolic 126 139 871  Diastolic 77 75 68  Pulse 52 63 56     Physical Exam  VS: noted--normal. Gen: alert, NAD, NONTOXIC APPEARING. HEENT: eyes without injection, drainage, or swelling.  Ears: EACs clear, TMs with normal light reflex and landmarks.  Nose: Clear rhinorrhea, with some dried, crusty exudate adherent to mildly injected mucosa.  No purulent d/c.  No paranasal sinus TTP.  No facial swelling.  Throat and mouth without focal lesion.  No pharyngial swelling, erythema, or exudate.   Neck: supple, no LAD.   LUNGS: CTA bilat, nonlabored resps.   CV: RRR, no m/r/g. EXT: no  c/c/e SKIN: no rash    LABS:  Last CBC Lab Results  Component Value Date   WBC 8.6 11/19/2023   HGB 15.9 11/19/2023   HCT 49.0 11/19/2023   MCV 94.8 11/19/2023   MCH  30.9 05/13/2023   RDW 16.2 (H) 11/19/2023   PLT 136.0 (L) 11/19/2023   Last metabolic panel Lab Results  Component Value Date   GLUCOSE 90 12/01/2023   NA 143 12/01/2023   K 4.8 12/01/2023   CL 107 12/01/2023   CO2 28 12/01/2023   BUN 20 12/01/2023   CREATININE 1.40 12/01/2023   GFR 48.72 (L) 12/01/2023   CALCIUM  9.8 12/01/2023   PROT 6.4 11/19/2023   ALBUMIN 4.4 11/19/2023   LABGLOB 2.0 06/17/2020   AGRATIO 2.2 06/17/2020   BILITOT 0.8 11/19/2023   ALKPHOS 63 11/19/2023   AST 24 11/19/2023   ALT 21 11/19/2023   ANIONGAP 6 05/13/2023   Last lipids Lab Results  Component Value Date   CHOL 101 11/19/2023   HDL 38.10 (L) 11/19/2023   LDLCALC 21 11/19/2023   TRIG 210.0 (H) 11/19/2023   CHOLHDL 3 11/19/2023   Last hemoglobin A1c Lab Results  Component Value Date   HGBA1C 6.4 (A) 11/19/2023   HGBA1C 6.4 11/19/2023   HGBA1C 6.4 11/19/2023   HGBA1C 6.4 11/19/2023   Last thyroid  functions Lab Results  Component Value Date   TSH 2.58 01/23/2023   Last vitamin B12 and Folate Lab Results  Component Value Date   VITAMINB12 386 12/25/2012   FOLATE 16.6 12/25/2012   IMPRESSION AND PLAN:  1 diabetes without complication. Hemoglobin A1c and renal function will be checked today.   Continue Jardiance  25 mg a day, glipizide  XL 10 mg a day, and metformin  1000 mg twice a day.   #2 hypertension, well-controlled on Lopressor  50 mg twice daily. Monitor electrolytes and creatinine today. No ARB or ACE-I per cardiologist b/c hypertrophic cardiomyopathy.   #3 hypercholesterolemia.  LDL was 21 approximately 3 months ago.   Continue Crestor  20 mg a day.   #4 atrial fibrillation. He is status post successful DCCV 03/2023. He will need to be on amiodarone  and Eliquis  indefinitely. He is followed closely by Dr. Ladona. Monitor TSH today.  #5 URI with cough and congestion. Resolving appropriately. Hycodan syrup, 1 to 2 teaspoon twice daily as needed, 120 mL.   An After  Visit Summary was printed and given to the patient.  FOLLOW UP: Return in about 3 months (around 05/21/2024) for routine chronic illness f/u. Next cpe 10/2024 Signed:  Gerlene Hockey, MD           02/19/2024

## 2024-02-29 ENCOUNTER — Other Ambulatory Visit: Payer: Self-pay | Admitting: Cardiology

## 2024-02-29 DIAGNOSIS — I10 Essential (primary) hypertension: Secondary | ICD-10-CM

## 2024-02-29 DIAGNOSIS — I421 Obstructive hypertrophic cardiomyopathy: Secondary | ICD-10-CM

## 2024-03-16 ENCOUNTER — Other Ambulatory Visit: Payer: Self-pay | Admitting: Family Medicine

## 2024-04-12 ENCOUNTER — Other Ambulatory Visit: Payer: Self-pay

## 2024-04-12 DIAGNOSIS — I4819 Other persistent atrial fibrillation: Secondary | ICD-10-CM

## 2024-04-14 MED ORDER — AMIODARONE HCL 200 MG PO TABS: 100.0000 mg | ORAL_TABLET | Freq: Every day | ORAL | 1 refills | Status: AC

## 2024-05-10 ENCOUNTER — Other Ambulatory Visit: Payer: Self-pay | Admitting: Cardiology

## 2024-05-11 ENCOUNTER — Other Ambulatory Visit: Payer: Self-pay | Admitting: Family Medicine

## 2024-05-22 ENCOUNTER — Other Ambulatory Visit: Payer: Self-pay | Admitting: Family Medicine

## 2024-06-01 ENCOUNTER — Other Ambulatory Visit: Payer: Self-pay | Admitting: Family Medicine

## 2024-06-02 ENCOUNTER — Other Ambulatory Visit: Payer: Self-pay

## 2024-06-02 ENCOUNTER — Encounter: Payer: Self-pay | Admitting: Family Medicine

## 2024-06-02 DIAGNOSIS — E119 Type 2 diabetes mellitus without complications: Secondary | ICD-10-CM

## 2024-06-02 DIAGNOSIS — E1121 Type 2 diabetes mellitus with diabetic nephropathy: Secondary | ICD-10-CM

## 2024-06-02 MED ORDER — METFORMIN HCL 1000 MG PO TABS: 1000.0000 mg | ORAL_TABLET | Freq: Two times a day (BID) | ORAL | 0 refills | Status: AC

## 2024-06-16 ENCOUNTER — Ambulatory Visit

## 2024-09-01 ENCOUNTER — Ambulatory Visit
